# Patient Record
Sex: Female | Born: 1935 | Race: White | Hispanic: No | Smoking: Never smoker
Health system: Southern US, Community
[De-identification: ages and names within clinical notes are randomized; demographics above are authoritative.]

## PROBLEM LIST (undated history)

## (undated) DIAGNOSIS — M1990 Unspecified osteoarthritis, unspecified site: Secondary | ICD-10-CM

## (undated) DIAGNOSIS — K219 Gastro-esophageal reflux disease without esophagitis: Secondary | ICD-10-CM

## (undated) DIAGNOSIS — F32A Depression, unspecified: Secondary | ICD-10-CM

## (undated) DIAGNOSIS — E785 Hyperlipidemia, unspecified: Secondary | ICD-10-CM

## (undated) DIAGNOSIS — M797 Fibromyalgia: Secondary | ICD-10-CM

## (undated) DIAGNOSIS — C4490 Unspecified malignant neoplasm of skin, unspecified: Secondary | ICD-10-CM

## (undated) DIAGNOSIS — T4145XA Adverse effect of unspecified anesthetic, initial encounter: Secondary | ICD-10-CM

## (undated) DIAGNOSIS — K648 Other hemorrhoids: Secondary | ICD-10-CM

## (undated) DIAGNOSIS — Z85828 Personal history of other malignant neoplasm of skin: Secondary | ICD-10-CM

## (undated) DIAGNOSIS — T8859XA Other complications of anesthesia, initial encounter: Secondary | ICD-10-CM

## (undated) DIAGNOSIS — N393 Stress incontinence (female) (male): Secondary | ICD-10-CM

## (undated) DIAGNOSIS — I10 Essential (primary) hypertension: Secondary | ICD-10-CM

## (undated) DIAGNOSIS — D649 Anemia, unspecified: Secondary | ICD-10-CM

## (undated) DIAGNOSIS — F329 Major depressive disorder, single episode, unspecified: Secondary | ICD-10-CM

## (undated) DIAGNOSIS — R0602 Shortness of breath: Secondary | ICD-10-CM

## (undated) DIAGNOSIS — G629 Polyneuropathy, unspecified: Secondary | ICD-10-CM

## (undated) HISTORY — PX: BREAST SURGERY: SHX581

## (undated) HISTORY — DX: Polyneuropathy, unspecified: G62.9

## (undated) HISTORY — DX: Essential (primary) hypertension: I10

## (undated) HISTORY — PX: CARPAL TUNNEL RELEASE: SHX101

## (undated) HISTORY — DX: Other hemorrhoids: K64.8

## (undated) HISTORY — DX: Depression, unspecified: F32.A

## (undated) HISTORY — DX: Stress incontinence (female) (male): N39.3

## (undated) HISTORY — DX: Fibromyalgia: M79.7

## (undated) HISTORY — DX: Hyperlipidemia, unspecified: E78.5

## (undated) HISTORY — DX: Gastro-esophageal reflux disease without esophagitis: K21.9

## (undated) HISTORY — DX: Anemia, unspecified: D64.9

## (undated) HISTORY — PX: JOINT REPLACEMENT: SHX530

## (undated) HISTORY — DX: Unspecified osteoarthritis, unspecified site: M19.90

## (undated) HISTORY — PX: ABDOMINAL HYSTERECTOMY: SHX81

## (undated) HISTORY — DX: Unspecified malignant neoplasm of skin, unspecified: C44.90

## (undated) HISTORY — PX: CHOLECYSTECTOMY: SHX55

## (undated) HISTORY — DX: Major depressive disorder, single episode, unspecified: F32.9

## (undated) HISTORY — PX: APPENDECTOMY: SHX54

---

## 1998-10-20 ENCOUNTER — Other Ambulatory Visit: Admission: RE | Admit: 1998-10-20 | Discharge: 1998-10-20 | Payer: Self-pay | Admitting: Obstetrics and Gynecology

## 1999-12-13 ENCOUNTER — Other Ambulatory Visit: Admission: RE | Admit: 1999-12-13 | Discharge: 1999-12-13 | Payer: Self-pay | Admitting: Obstetrics and Gynecology

## 2000-12-27 ENCOUNTER — Other Ambulatory Visit: Admission: RE | Admit: 2000-12-27 | Discharge: 2000-12-27 | Payer: Self-pay | Admitting: Obstetrics and Gynecology

## 2002-01-14 ENCOUNTER — Other Ambulatory Visit: Admission: RE | Admit: 2002-01-14 | Discharge: 2002-01-14 | Payer: Self-pay | Admitting: Gynecology

## 2003-04-29 ENCOUNTER — Ambulatory Visit (HOSPITAL_COMMUNITY): Admission: RE | Admit: 2003-04-29 | Discharge: 2003-04-29 | Payer: Self-pay | Admitting: Gastroenterology

## 2003-04-29 ENCOUNTER — Encounter (INDEPENDENT_AMBULATORY_CARE_PROVIDER_SITE_OTHER): Payer: Self-pay | Admitting: *Deleted

## 2003-04-29 HISTORY — PX: COLONOSCOPY: SHX5424

## 2004-03-02 ENCOUNTER — Other Ambulatory Visit: Admission: RE | Admit: 2004-03-02 | Discharge: 2004-03-02 | Payer: Self-pay | Admitting: Gynecology

## 2004-09-28 ENCOUNTER — Encounter: Admission: RE | Admit: 2004-09-28 | Discharge: 2004-09-28 | Payer: Self-pay | Admitting: Orthopedic Surgery

## 2004-09-30 ENCOUNTER — Ambulatory Visit (HOSPITAL_BASED_OUTPATIENT_CLINIC_OR_DEPARTMENT_OTHER): Admission: RE | Admit: 2004-09-30 | Discharge: 2004-09-30 | Payer: Self-pay | Admitting: Orthopedic Surgery

## 2004-09-30 ENCOUNTER — Ambulatory Visit (HOSPITAL_COMMUNITY): Admission: RE | Admit: 2004-09-30 | Discharge: 2004-09-30 | Payer: Self-pay | Admitting: Orthopedic Surgery

## 2005-03-04 ENCOUNTER — Encounter: Admission: RE | Admit: 2005-03-04 | Discharge: 2005-03-04 | Payer: Self-pay | Admitting: Gynecology

## 2006-03-27 ENCOUNTER — Other Ambulatory Visit: Admission: RE | Admit: 2006-03-27 | Discharge: 2006-03-27 | Payer: Self-pay | Admitting: Gynecology

## 2007-04-17 ENCOUNTER — Encounter: Admission: RE | Admit: 2007-04-17 | Discharge: 2007-04-17 | Payer: Self-pay | Admitting: Gynecology

## 2007-12-12 ENCOUNTER — Inpatient Hospital Stay (HOSPITAL_COMMUNITY): Admission: RE | Admit: 2007-12-12 | Discharge: 2007-12-17 | Payer: Self-pay | Admitting: Orthopedic Surgery

## 2008-06-20 ENCOUNTER — Encounter: Admission: RE | Admit: 2008-06-20 | Discharge: 2008-06-20 | Payer: Self-pay | Admitting: Neurology

## 2008-06-26 ENCOUNTER — Encounter: Admission: RE | Admit: 2008-06-26 | Discharge: 2008-09-24 | Payer: Self-pay | Admitting: Neurology

## 2009-02-27 ENCOUNTER — Inpatient Hospital Stay (HOSPITAL_COMMUNITY): Admission: EM | Admit: 2009-02-27 | Discharge: 2009-03-04 | Payer: Self-pay | Admitting: Emergency Medicine

## 2009-02-28 ENCOUNTER — Encounter (INDEPENDENT_AMBULATORY_CARE_PROVIDER_SITE_OTHER): Payer: Self-pay | Admitting: *Deleted

## 2009-05-26 ENCOUNTER — Encounter: Admission: RE | Admit: 2009-05-26 | Discharge: 2009-05-26 | Payer: Self-pay | Admitting: Orthopedic Surgery

## 2011-01-08 ENCOUNTER — Emergency Department (HOSPITAL_COMMUNITY): Payer: Medicare Other

## 2011-01-08 ENCOUNTER — Encounter (INDEPENDENT_AMBULATORY_CARE_PROVIDER_SITE_OTHER): Payer: Self-pay | Admitting: *Deleted

## 2011-01-08 ENCOUNTER — Inpatient Hospital Stay (HOSPITAL_COMMUNITY)
Admission: EM | Admit: 2011-01-08 | Discharge: 2011-01-12 | DRG: 394 | Disposition: A | Discharge status: Home / Self Care | Payer: Medicare Other | Attending: Internal Medicine | Admitting: Internal Medicine

## 2011-01-08 DIAGNOSIS — F3289 Other specified depressive episodes: Secondary | ICD-10-CM | POA: Diagnosis present

## 2011-01-08 DIAGNOSIS — N390 Urinary tract infection, site not specified: Secondary | ICD-10-CM | POA: Diagnosis present

## 2011-01-08 DIAGNOSIS — K219 Gastro-esophageal reflux disease without esophagitis: Secondary | ICD-10-CM | POA: Diagnosis present

## 2011-01-08 DIAGNOSIS — I10 Essential (primary) hypertension: Secondary | ICD-10-CM | POA: Diagnosis present

## 2011-01-08 DIAGNOSIS — E785 Hyperlipidemia, unspecified: Secondary | ICD-10-CM | POA: Diagnosis present

## 2011-01-08 DIAGNOSIS — M1710 Unilateral primary osteoarthritis, unspecified knee: Secondary | ICD-10-CM | POA: Diagnosis present

## 2011-01-08 DIAGNOSIS — F329 Major depressive disorder, single episode, unspecified: Secondary | ICD-10-CM | POA: Diagnosis present

## 2011-01-08 DIAGNOSIS — D62 Acute posthemorrhagic anemia: Secondary | ICD-10-CM | POA: Diagnosis present

## 2011-01-08 DIAGNOSIS — K559 Vascular disorder of intestine, unspecified: Principal | ICD-10-CM | POA: Diagnosis present

## 2011-01-08 DIAGNOSIS — D72829 Elevated white blood cell count, unspecified: Secondary | ICD-10-CM | POA: Diagnosis present

## 2011-01-08 DIAGNOSIS — G609 Hereditary and idiopathic neuropathy, unspecified: Secondary | ICD-10-CM | POA: Diagnosis present

## 2011-01-08 LAB — COMPREHENSIVE METABOLIC PANEL
ALT: 16 U/L (ref 0–35)
AST: 24 U/L (ref 0–37)
Albumin: 3.6 g/dL (ref 3.5–5.2)
Alkaline Phosphatase: 77 U/L (ref 39–117)
BUN: 32 mg/dL — ABNORMAL HIGH (ref 6–23)
CO2: 24 mEq/L (ref 19–32)
Calcium: 9.9 mg/dL (ref 8.4–10.5)
Chloride: 98 mEq/L (ref 96–112)
Creatinine, Ser: 1.33 mg/dL — ABNORMAL HIGH (ref 0.4–1.2)
GFR calc Af Amer: 47 mL/min — ABNORMAL LOW (ref >60)
GFR calc non Af Amer: 39 mL/min — ABNORMAL LOW (ref >60)
Glucose, Bld: 128 mg/dL — ABNORMAL HIGH (ref 70–99)
Potassium: 3.7 mEq/L (ref 3.5–5.1)
Sodium: 134 mEq/L — ABNORMAL LOW (ref 135–145)
Total Bilirubin: 1.2 mg/dL (ref 0.3–1.2)
Total Protein: 6.7 g/dL (ref 6.0–8.3)

## 2011-01-08 LAB — HEMOGLOBIN AND HEMATOCRIT, BLOOD
HCT: 37.5 % (ref 36.0–46.0)
Hemoglobin: 12.7 g/dL (ref 12.0–15.0)

## 2011-01-08 LAB — DIFFERENTIAL
Basophils Absolute: 0 10*3/uL (ref 0.0–0.1)
Basophils Relative: 0 % (ref 0–1)
Eosinophils Absolute: 0 10*3/uL (ref 0.0–0.7)
Eosinophils Relative: 0 % (ref 0–5)
Lymphocytes Relative: 6 % — ABNORMAL LOW (ref 12–46)
Lymphs Abs: 1.7 10*3/uL (ref 0.7–4.0)
Monocytes Absolute: 1.7 10*3/uL — ABNORMAL HIGH (ref 0.1–1.0)
Monocytes Relative: 6 % (ref 3–12)
Neutro Abs: 25.1 10*3/uL — ABNORMAL HIGH (ref 1.7–7.7)
Neutrophils Relative %: 88 % — ABNORMAL HIGH (ref 43–77)

## 2011-01-08 LAB — URINALYSIS, ROUTINE W REFLEX MICROSCOPIC
Bilirubin Urine: NEGATIVE
Hgb urine dipstick: NEGATIVE
Ketones, ur: NEGATIVE mg/dL
Nitrite: NEGATIVE
Protein, ur: NEGATIVE mg/dL
Specific Gravity, Urine: 1.018 (ref 1.005–1.030)
Urine Glucose, Fasting: NEGATIVE mg/dL
Urobilinogen, UA: 0.2 mg/dL (ref 0.0–1.0)
pH: 5.5 (ref 5.0–8.0)

## 2011-01-08 LAB — POCT CARDIAC MARKERS
CKMB, poc: <1 ng/mL — ABNORMAL LOW (ref 1.0–8.0)
Myoglobin, poc: 185 ng/mL (ref 12–200)
Troponin i, poc: <0.05 ng/mL (ref 0.00–0.09)

## 2011-01-08 LAB — CBC
HCT: 41 % (ref 36.0–46.0)
Hemoglobin: 13.9 g/dL (ref 12.0–15.0)
MCH: 29.4 pg (ref 26.0–34.0)
MCHC: 33.9 g/dL (ref 30.0–36.0)
MCV: 86.9 fL (ref 78.0–100.0)
Platelets: 261 10*3/uL (ref 150–400)
RBC: 4.72 MIL/uL (ref 3.87–5.11)
RDW: 14.8 % (ref 11.5–15.5)
WBC: 28.5 10*3/uL — ABNORMAL HIGH (ref 4.0–10.5)

## 2011-01-08 LAB — LIPASE, BLOOD: Lipase: 19 U/L (ref 11–59)

## 2011-01-08 LAB — URINE MICROSCOPIC-ADD ON

## 2011-01-08 LAB — OCCULT BLOOD, POC DEVICE: Fecal Occult Bld: POSITIVE

## 2011-01-08 MED ORDER — IOHEXOL 300 MG/ML  SOLN
125.0000 mL | Freq: Once | INTRAMUSCULAR | Status: AC | PRN
  Administered 2011-01-08: 125 mL via INTRAVENOUS

## 2011-01-09 LAB — DIFFERENTIAL
Basophils Absolute: 0 10*3/uL (ref 0.0–0.1)
Basophils Relative: 0 % (ref 0–1)
Eosinophils Absolute: 0 10*3/uL (ref 0.0–0.7)
Eosinophils Relative: 0 % (ref 0–5)
Lymphocytes Relative: 10 % — ABNORMAL LOW (ref 12–46)
Lymphs Abs: 2.4 10*3/uL (ref 0.7–4.0)
Monocytes Absolute: 2 10*3/uL — ABNORMAL HIGH (ref 0.1–1.0)
Monocytes Relative: 8 % (ref 3–12)
Neutro Abs: 19.8 10*3/uL — ABNORMAL HIGH (ref 1.7–7.7)
Neutrophils Relative %: 82 % — ABNORMAL HIGH (ref 43–77)

## 2011-01-09 LAB — HEMOGLOBIN AND HEMATOCRIT, BLOOD: Hemoglobin: 11.6 g/dL — ABNORMAL LOW (ref 12.0–15.0)

## 2011-01-09 LAB — CBC
HCT: 36.1 % (ref 36.0–46.0)
Hemoglobin: 11.9 g/dL — ABNORMAL LOW (ref 12.0–15.0)
MCH: 29 pg (ref 26.0–34.0)
MCHC: 33 g/dL (ref 30.0–36.0)
MCV: 87.8 fL (ref 78.0–100.0)
Platelets: 184 10*3/uL (ref 150–400)
RBC: 4.11 MIL/uL (ref 3.87–5.11)
RDW: 15.1 % (ref 11.5–15.5)
WBC: 24.2 10*3/uL — ABNORMAL HIGH (ref 4.0–10.5)

## 2011-01-09 LAB — BASIC METABOLIC PANEL
BUN: 27 mg/dL — ABNORMAL HIGH (ref 6–23)
CO2: 27 mEq/L (ref 19–32)
Calcium: 8.8 mg/dL (ref 8.4–10.5)
Chloride: 101 mEq/L (ref 96–112)
Creatinine, Ser: 0.92 mg/dL (ref 0.4–1.2)
GFR calc Af Amer: >60 mL/min (ref >60)
GFR calc non Af Amer: 59 mL/min — ABNORMAL LOW (ref >60)
Glucose, Bld: 137 mg/dL — ABNORMAL HIGH (ref 70–99)
Potassium: 3.3 mEq/L — ABNORMAL LOW (ref 3.5–5.1)
Sodium: 135 mEq/L (ref 135–145)

## 2011-01-09 LAB — GRAM STAIN

## 2011-01-09 LAB — CLOSTRIDIUM DIFFICILE BY PCR: Toxigenic C. Difficile by PCR: NEGATIVE

## 2011-01-10 ENCOUNTER — Encounter (INDEPENDENT_AMBULATORY_CARE_PROVIDER_SITE_OTHER): Payer: Self-pay | Admitting: *Deleted

## 2011-01-10 ENCOUNTER — Inpatient Hospital Stay (HOSPITAL_COMMUNITY): Payer: Medicare Other

## 2011-01-10 LAB — COMPREHENSIVE METABOLIC PANEL
ALT: 11 U/L (ref 0–35)
Albumin: 2.6 g/dL — ABNORMAL LOW (ref 3.5–5.2)
Alkaline Phosphatase: 61 U/L (ref 39–117)
Calcium: 8.6 mg/dL (ref 8.4–10.5)
Potassium: 3.1 mEq/L — ABNORMAL LOW (ref 3.5–5.1)
Sodium: 139 mEq/L (ref 135–145)
Total Protein: 4.9 g/dL — ABNORMAL LOW (ref 6.0–8.3)

## 2011-01-10 LAB — CBC
Platelets: 166 10*3/uL (ref 150–400)
RDW: 15 % (ref 11.5–15.5)
WBC: 15.7 10*3/uL — ABNORMAL HIGH (ref 4.0–10.5)

## 2011-01-10 LAB — OVA AND PARASITE EXAMINATION

## 2011-01-10 LAB — DIFFERENTIAL
Basophils Absolute: 0 10*3/uL (ref 0.0–0.1)
Basophils Relative: 0 % (ref 0–1)
Eosinophils Absolute: 0.1 10*3/uL (ref 0.0–0.7)
Eosinophils Relative: 1 % (ref 0–5)

## 2011-01-11 DIAGNOSIS — R933 Abnormal findings on diagnostic imaging of other parts of digestive tract: Secondary | ICD-10-CM

## 2011-01-11 DIAGNOSIS — K5289 Other specified noninfective gastroenteritis and colitis: Secondary | ICD-10-CM

## 2011-01-11 LAB — CBC
HCT: 30.2 % — ABNORMAL LOW (ref 36.0–46.0)
Hemoglobin: 10 g/dL — ABNORMAL LOW (ref 12.0–15.0)
MCHC: 33.1 g/dL (ref 30.0–36.0)
MCV: 87.3 fL (ref 78.0–100.0)
WBC: 11.3 10*3/uL — ABNORMAL HIGH (ref 4.0–10.5)

## 2011-01-11 LAB — BASIC METABOLIC PANEL
CO2: 26 mEq/L (ref 19–32)
Glucose, Bld: 114 mg/dL — ABNORMAL HIGH (ref 70–99)
Potassium: 2.9 mEq/L — ABNORMAL LOW (ref 3.5–5.1)
Sodium: 136 mEq/L (ref 135–145)

## 2011-01-12 LAB — CBC
HCT: 30.4 % — ABNORMAL LOW (ref 36.0–46.0)
Hemoglobin: 10.1 g/dL — ABNORMAL LOW (ref 12.0–15.0)
MCH: 29.4 pg (ref 26.0–34.0)
MCV: 88.4 fL (ref 78.0–100.0)
RBC: 3.44 MIL/uL — ABNORMAL LOW (ref 3.87–5.11)

## 2011-01-12 LAB — BASIC METABOLIC PANEL
CO2: 28 mEq/L (ref 19–32)
Chloride: 103 mEq/L (ref 96–112)
GFR calc Af Amer: >60 mL/min (ref >60)
Potassium: 3.6 mEq/L (ref 3.5–5.1)
Sodium: 139 mEq/L (ref 135–145)

## 2011-01-13 ENCOUNTER — Encounter (INDEPENDENT_AMBULATORY_CARE_PROVIDER_SITE_OTHER): Payer: Self-pay | Admitting: *Deleted

## 2011-01-13 LAB — STOOL CULTURE

## 2011-01-15 LAB — CULTURE, BLOOD (ROUTINE X 2): Culture  Setup Time: 201202050047

## 2011-01-17 ENCOUNTER — Other Ambulatory Visit: Payer: Self-pay | Admitting: Orthopedic Surgery

## 2011-01-17 ENCOUNTER — Encounter (INDEPENDENT_AMBULATORY_CARE_PROVIDER_SITE_OTHER): Payer: Self-pay | Admitting: *Deleted

## 2011-01-17 ENCOUNTER — Encounter (HOSPITAL_COMMUNITY): Payer: Medicare Other | Attending: Orthopedic Surgery

## 2011-01-17 DIAGNOSIS — Z01810 Encounter for preprocedural cardiovascular examination: Secondary | ICD-10-CM | POA: Insufficient documentation

## 2011-01-17 DIAGNOSIS — Z01812 Encounter for preprocedural laboratory examination: Secondary | ICD-10-CM | POA: Insufficient documentation

## 2011-01-17 DIAGNOSIS — Z01811 Encounter for preprocedural respiratory examination: Secondary | ICD-10-CM | POA: Insufficient documentation

## 2011-01-17 LAB — CBC
Hemoglobin: 11.7 g/dL — ABNORMAL LOW (ref 12.0–15.0)
MCH: 29.3 pg (ref 26.0–34.0)
MCV: 88.5 fL (ref 78.0–100.0)
RBC: 3.99 MIL/uL (ref 3.87–5.11)

## 2011-01-17 LAB — COMPREHENSIVE METABOLIC PANEL
AST: 55 U/L — ABNORMAL HIGH (ref 0–37)
Albumin: 3.2 g/dL — ABNORMAL LOW (ref 3.5–5.2)
Calcium: 9.5 mg/dL (ref 8.4–10.5)
Chloride: 101 mEq/L (ref 96–112)
Creatinine, Ser: 0.67 mg/dL (ref 0.4–1.2)
GFR calc Af Amer: >60 mL/min (ref >60)
Total Bilirubin: 0.3 mg/dL (ref 0.3–1.2)
Total Protein: 6.5 g/dL (ref 6.0–8.3)

## 2011-01-17 LAB — URINALYSIS, ROUTINE W REFLEX MICROSCOPIC
Protein, ur: NEGATIVE mg/dL
Urine Glucose, Fasting: NEGATIVE mg/dL

## 2011-01-17 LAB — PROTIME-INR: Prothrombin Time: 13.3 seconds (ref 11.6–15.2)

## 2011-01-17 LAB — SURGICAL PCR SCREEN: Staphylococcus aureus: NEGATIVE

## 2011-01-17 NOTE — H&P (Signed)
[name], [name] NO.:  [id number]  MEDICAL RECORD NO.:  [id number]           PATIENT TYPE:  E  LOCATION:  WLED                         FACILITY:  [facility]  PHYSICIAN:  Homero Fellers, MD   DATE OF BIRTH:  1930/10/09  DATE OF ADMISSION:  01/08/2011 DATE OF DISCHARGE:                             HISTORY & PHYSICAL   CHIEF COMPLAINT:  Vomiting, abdominal pain, and rectal bleeding.  HISTORY OF PRESENT ILLNESS:  This is a 77 year old woman, who lives alone.  She was using the commode yesterday when she noticed passage of frank red blood per rectum this happened several times yesterday and also continued today even though to a lesser severity.  Today, she is passing dark or tart blood anytime she uses the bathroom.  She also vomited 4 times yesterday, but none today.  She also complained of abdominal pain, which was initially on the left lower quadrant region, but now involving the whole of the lower abdomen in a band-like fashion. She described abdominal pain as clumping in nature.  She has no prior history of rectal bleed.  She denies any chest pain, shortness of breath, or palpitations.  She had subjective fever with some chills yesterday at her home.  She denies any urinary symptoms such as dysuria, urinary frequency, or hematuria.  She has no colonoscopy done recently.  PAST MEDICAL HISTORY: 1. High blood pressure. 2. Anemia. 3. Gastroesophageal reflux disease. 4. Osteoarthritis. 5. Peripheral neuropathy. 6. Skin cancer.  She has been planned for knee replacement surgery by Dr. Lequita Halt.  PAST SURGICAL HISTORY:  Replacement of the right knee in 2009.  MEDICATIONS: 1. Aspirin 325 mg daily. 2. Lasix 20 mg daily. 3. Lisinopril 20 mg daily. 4. Lyrica 50 mg b.i.d. 5. Trazodone 20 mg q.h.s. 6. Zocor 20 mg q.h.s. 7. Ultram 50 mg p.r.n.  ALLERGIES:  NONE.  SOCIAL HISTORY:  No smoking, alcohol or drug use.  She lives alone.  FAMILY HISTORY:   Positive for colon cancer in mother.  No history of myocardial infarction in any family members.  Ten-point review of systems is negative except as above.  PHYSICAL EXAMINATION:  VITAL SIGNS:  Blood pressure 102/69 with pulse of 121 on arrival, but this has improved to blood pressure of 142/73 and pulse of 89; respirations are 18, temperature is 98.3, and O2 sat 94% on room air. GENERAL:  The patient is comfortable, lying in bed, in no respiratory distress. HEENT:  Pallor.  She is not pale, anicteric. NECK:  Supple.  No JVD, adenopathy, or thyromegaly. LUNGS:  Clear to auscultation bilaterally.  No wheezing or crackles. HEART:  S1 and S2 regular rate and rhythm.  No murmurs, rubs, or gallop. ABDOMEN:  Full, soft, vague tenderness in the lower abdominal region. No rebound or tenderness.  Bowel sound is present.  No masses. EXTREMITIES:  No Edema, clubbing, or cyanosis. NEUROLOGIC:  No focal deficits.Marland Kitchen  LABORATORY DATA:  White count is 28,000 with a left shift, hemoglobin is 13.9, platelet count is 261.  Occult blood is positive in stool. Urinalysis suggests urinary tract infection.  Chemistry, sodium 134, potassium 3.7, BUN 32,  creatinine 1.33, and glucose is 128.  Liver enzymes normal.  Cardiac enzymes normal.  CT of the abdomen showed evidence of acute colitis involving the distal colon from the level of the splenic flexure all the way to the rectum.  There are no perforation, obstruction, or abscess.  There is some free fluid in the pelvis.  Acute abdominal series just showed prominent small bowel with air-fluid levels, which proved to suggest gastroenteritis.  ASSESSMENT:  A 77 year old woman with: 1. Acute colitis with no evidence of perforation or abscess 2. Leukocytosis with left shift. 3. Urinary tract infection. 4. Rectal bleeding, which may be secondary to acute colitis, but may     also represent bleeding from other sources such as diverticular     bleed or even occult  malignancy. 5. Mild acute kidney failure likely secondary to fluid losses from     vomiting and rectal bleeding. 6. High blood pressure, which appears fairly stable at this time.  PLAN:  Admit to Telemetry. Do serial hemoglobin.  Alertness 24 hours. Continue IV fluids, keep n.p.o. for the next 24 hours, after which she can poorly be commenced on clear liquids if symptoms are improving.  I will put her on antiemesis as well as IV antibiotics.  Blood culture and urine culture will be sent.  The patient will also benefit from gastroenterology consultation on this admission.  She will probably need a colonoscopy once the infection is controlled probably earliest as an outpatient.  I will use Flagyl and ciprofloxacin at this time as far as antibiotics is concerned.  Further adjustments could be done based on culture results.  Condition overall is stable.     Homero Fellers, MD     FA/MEDQ  D:  01/08/2011  T:  01/08/2011  Job:  [id number]  Electronically Signed by Homero Fellers  on 01/10/2011 10:30:31 PM

## 2011-01-24 NOTE — Discharge Summary (Signed)
[name], GAMBRELL NO.:  [id number]  MEDICAL RECORD NO.:  [id number]           PATIENT TYPE:  I  LOCATION:  1432                         FACILITY:  [facility]  PHYSICIAN:  Jeoffrey Massed, MD    DATE OF BIRTH:  Jun 15, 1930  DATE OF ADMISSION:  01/08/2011 DATE OF DISCHARGE:                        DISCHARGE SUMMARY - REFERRING   PRIMARY CARE PRACTITIONER:  Dr. Knox Royalty at [facility].  PRIMARY GASTROENTEROLOGIST:  Hedwig Morton. [name], MD  PRIMARY ORTHOPEDIC SURGEON:  Ollen Gross, MD  PRIMARY DISCHARGE DIAGNOSES: 1. Left-sided colitis either ischemic or infectious. 2. Mild acute blood loss anemia.  SECONDARY DISCHARGE DIAGNOSES: 1. Hypertension. 2. History of peripheral neuropathy. 3. Depression. 4. Osteoarthritis of the left knee due for a knee placement in the     next few weeks. 5. Dyslipidemia.  DISCHARGE MEDICATIONS: 1. Ciprofloxacin 500 mg 1 tablet p.o. twice daily for 4 more days. 2. Flagyl 500 mg 1 tablet p.o. 3 times a day for 4 more days. 3. Lyrica 50 mg 1 capsule p.o. twice daily. 4. Multivitamins 1 tablet p.o. daily. 5. Zocor 20 mg 1 tablet p.o. nightly. 6. Trazodone 100 mg 1 tablet p.o. nightly p.r.n. 7. Ultram 50 mg 1 tablet p.o. q.4 hours p.r.n. 8. Vivelle 0.075 mg/24 hours 1 patch transdermally on Mondays,     Wednesdays, and Fridays. 9. Zoloft 50 mg 1 tablet p.o. daily.  CONSULTATION:  Hedwig Morton. [name], MD, from [facility].  BRIEF HISTORY OF PRESENT ILLNESS:  The patient is a 77 year old Caucasian female who came in with abdominal pain, nausea, vomiting along with rectal bleeding.  She was then admitted to the hospitalist service for further evaluation and treatment.  PERTINENT LABORATORY DATA: 1. Hemoglobin on discharge is 10.1. 2. Chemistries on discharge shows a potassium of 3.6 and a creatinine     of 0.6. 3. Stool cultures preliminary shows no suspicious colonies. 4. Stool for ova and parasites were  negative. 5. Blood cultures drawn on January 08, 2011, are negative so far. 6. Stool C difficile PCR was negative as well.  RADIOLOGICAL STUDIES:  CT of the abdomen and pelvis done on January 08, 2011, shows acute colitis involving the distal colon, beginning at the level of the splenic flexure and continuing to the level of the rectum. No evidence of associated perforation, obstruction, or abscesses.  There is some free fluid in the pelvis.  BRIEF HOSPITAL COURSE: 1. Acute colitis.  This was involving the left side.  This was     presumed to be ischemic, however, we could not rule out infectious     etiology.  All of the patient's stool studies as well as blood     cultures are negative.  Initially on admission, she had a white     count of 28,500.  Following admission, she was placed n.p.o., given     fluids, and placed on ciprofloxacin and Flagyl.  She continued to     have slow improvement over the past 24-48 hours.  She has been     transitioned to clear liquids and then to full liquids and now to a  low residual diet which she has been tolerating well.  Her white     count yesterday has decreased down to 11.3.  Plans are to continue     the patient on Cipro and Flagyl for the next 4 days upon discharge.     She will follow up with Dr. Lina Sar from [facility] for further     workup as an outpatient, perhaps a colonoscopy at some point in     time when the colitis subsides.  Since this is presumed at this     point to be ischemic in nature and during this hospitalization, her     blood pressure was closely monitored without the need for starting     any of her antihypertensive medications.  Her antihypertensive     medications will be placed on hold so will her Lasix to prevent a     low flow state in case this was from an ischemic origin. 2. Anemia.  This was secondary to blood loss secondary to colitis.     This has resolved with the patient having no hematochezia for  the     past 48 hours.  Her hemoglobin on discharge was 10.1.  She did not     require any blood transfusion. 3. Hypertension.  As noted above, this was controlled without the use     of any of the patient's usual antihypertensive medications.  With     the concern of this being ischemic colitis, we have elected not to     resume her blood pressure medications to prevent a low flow state     causing a recurrence of her symptoms.  I have told the patient to     monitor her blood pressure carefully and follow up with her primary     care physician in the next 2-5 days for a recheck to see if she     actually needs these medications to be restarted. 4. Osteoarthritis.  The patient does see Dr. Lequita Halt from Orthoped[facility]     and is scheduled for left knee replacement surgery.  It is     suggested that during this time the patient be kept well hydrated     and her Lasix not be resumed to prevent a low flow state which can     cause recurrence of the colitis.  I have asked the patient to make     a followup appointment with Dr. Ollen Gross in the next 1 week so     that he is aware that the patient was in the hospital for a     presumed ischemic colitis flare. 5. Dyslipidemia.  She is to continue her statin. 6. Neuropathy.  She is to continue Lyrica.  DISPOSITION:  The patient is stable to be discharged home.  FOLLOWUP INSTRUCTIONS: 1. The patient to continue with a low residual diet for at least 2     weeks' time. 2. She is to follow up with Dr. [name] from [facility] in the next 2-5 days for a followup appointment. 3. The patient to follow up with Dr. [name] from [facility] on March     19th at 10:15 a.m. 4. The patient to follow up with her primary orthopod, Dr. Ollen Gross, in the next 1 week.  She is to call and make an     appointment.  Total time spent 45 minutes.  Jeoffrey Massed, MD     SG/MEDQ  D:  01/12/2011  T:  01/12/2011  Job:   [id number]  cc:   Dr. Knox Royalty [facility]  Hedwig Morton. [name], MD [address] [place] [address]  Ollen Gross, M.D. Fax: [phone]  Electronically Signed by Jeoffrey Massed  on 01/24/2011 04:23:44 PM

## 2011-01-26 ENCOUNTER — Encounter (INDEPENDENT_AMBULATORY_CARE_PROVIDER_SITE_OTHER): Payer: Self-pay | Admitting: *Deleted

## 2011-01-26 ENCOUNTER — Inpatient Hospital Stay (HOSPITAL_COMMUNITY)
Admission: RE | Admit: 2011-01-26 | Discharge: 2011-01-29 | DRG: 470 | Disposition: A | Discharge status: Skilled Nursing Facility | Payer: Medicare Other | Source: Ambulatory Visit | Attending: Orthopedic Surgery | Admitting: Orthopedic Surgery

## 2011-01-26 DIAGNOSIS — N393 Stress incontinence (female) (male): Secondary | ICD-10-CM | POA: Diagnosis present

## 2011-01-26 DIAGNOSIS — Z79899 Other long term (current) drug therapy: Secondary | ICD-10-CM

## 2011-01-26 DIAGNOSIS — T40605A Adverse effect of unspecified narcotics, initial encounter: Secondary | ICD-10-CM | POA: Diagnosis not present

## 2011-01-26 DIAGNOSIS — I10 Essential (primary) hypertension: Secondary | ICD-10-CM | POA: Diagnosis present

## 2011-01-26 DIAGNOSIS — G609 Hereditary and idiopathic neuropathy, unspecified: Secondary | ICD-10-CM | POA: Diagnosis present

## 2011-01-26 DIAGNOSIS — R112 Nausea with vomiting, unspecified: Secondary | ICD-10-CM | POA: Diagnosis not present

## 2011-01-26 DIAGNOSIS — M1710 Unilateral primary osteoarthritis, unspecified knee: Principal | ICD-10-CM | POA: Diagnosis present

## 2011-01-26 DIAGNOSIS — K219 Gastro-esophageal reflux disease without esophagitis: Secondary | ICD-10-CM | POA: Diagnosis present

## 2011-01-26 DIAGNOSIS — Z96659 Presence of unspecified artificial knee joint: Secondary | ICD-10-CM

## 2011-01-26 DIAGNOSIS — D62 Acute posthemorrhagic anemia: Secondary | ICD-10-CM | POA: Diagnosis not present

## 2011-01-27 LAB — PROTIME-INR
INR: 1.11 (ref 0.00–1.49)
Prothrombin Time: 14.5 seconds (ref 11.6–15.2)

## 2011-01-27 LAB — BASIC METABOLIC PANEL
BUN: 9 mg/dL (ref 6–23)
CO2: 28 mEq/L (ref 19–32)
Calcium: 8.8 mg/dL (ref 8.4–10.5)
Creatinine, Ser: 0.6 mg/dL (ref 0.4–1.2)
GFR calc Af Amer: >60 mL/min (ref >60)

## 2011-01-27 LAB — CBC
MCH: 28.2 pg (ref 26.0–34.0)
MCHC: 31.7 g/dL (ref 30.0–36.0)
MCV: 89 fL (ref 78.0–100.0)
Platelets: 275 10*3/uL (ref 150–400)

## 2011-01-28 LAB — CBC
HCT: 27.3 % — ABNORMAL LOW (ref 36.0–46.0)
MCH: 28.5 pg (ref 26.0–34.0)
MCV: 88.3 fL (ref 78.0–100.0)
Platelets: 223 10*3/uL (ref 150–400)
RBC: 3.09 MIL/uL — ABNORMAL LOW (ref 3.87–5.11)

## 2011-01-28 LAB — BASIC METABOLIC PANEL
BUN: 7 mg/dL (ref 6–23)
CO2: 27 mEq/L (ref 19–32)
Calcium: 8.5 mg/dL (ref 8.4–10.5)
Chloride: 103 mEq/L (ref 96–112)
Creatinine, Ser: 0.51 mg/dL (ref 0.4–1.2)
Glucose, Bld: 127 mg/dL — ABNORMAL HIGH (ref 70–99)

## 2011-01-29 LAB — CBC
MCH: 28.6 pg (ref 26.0–34.0)
MCHC: 32.5 g/dL (ref 30.0–36.0)
MCV: 88 fL (ref 78.0–100.0)
Platelets: 212 10*3/uL (ref 150–400)
RBC: 2.76 MIL/uL — ABNORMAL LOW (ref 3.87–5.11)
RDW: 15.5 % (ref 11.5–15.5)

## 2011-01-30 LAB — TYPE AND SCREEN
DAT, IgG: NEGATIVE
Unit division: 0

## 2011-02-10 NOTE — Discharge Summary (Signed)
NAMEHAMDI, VARI              ACCOUNT NO.:  [id number]  MEDICAL RECORD NO.:  [id number]           PATIENT TYPE:  I  LOCATION:  1605                         FACILITY:  [facility]  PHYSICIAN:  Ollen Gross, M.D.    DATE OF BIRTH:  03/02/1930  DATE OF ADMISSION:  01/26/2011 DATE OF DISCHARGE:  01/17/2011                        DISCHARGE SUMMARY - REFERRING   TENTATIVE DATE OF DISCHARGE:  January 29, 2011.  ADMITTING DIAGNOSES: 1. Osteoarthritis left knee. 2. Hypertension. 3. History of anemia. 4. Gastroesophageal reflux disease. 5. Peripheral neuropathy. 6. History of skin cancer. 7. Some stress urinary incontinence.  DISCHARGE DIAGNOSES: 1. Osteoarthritis left knee, status post left total knee replacement     arthroplasty. 2. Postoperative acute blood loss anemia, did not require transfusion     at time of dictation. 3. Hypertension. 4. History of anemia. 5. Gastroesophageal reflux disease. 6. Peripheral neuropathy. 7. History of skin cancer. 8. Some stress urinary incontinence.  PROCEDURE:  On January 26, 2011, left total knee.  SURGEON:  Ollen Gross, M.D.  ASSISTANT:  Alexzandrew L. Perkins, P.A.C.  ANESTHESIA:  General.  TOURNIQUET TIME:  32 minutes.  CONSULTS:  None.  BRIEF HISTORY:  Mr. Schmelzle is a 77 year old female with end-stage arthritis of left knee with progressive worsening pain and dysfunction. She has had a successful right total knee, now presents for a left total knee.  LABORATORY DATA:  Preop CBC showed hemoglobin 11.7, hematocrit 35.3, white cell count 12.4, and platelets 460,000.  PT/INR 13.3 and 0.99 with a PTT of 34 on admission.  Chem panel on admission, minimally elevated SGOT of 55.  Remaining Chem panel within normal limits with the exception of slightly low albumin of 3.2.  Preop UA, cloudy otherwise negative.  Blood group type O negative.  Nasal swabs were negative. Staph aureus negative for MRSA.  Serial CBCs were followed  first 2 days after surgery.  Hemoglobin down to 9.7 on postop day #1, and then last H and H was 8.8 and 27.3 on postop day #2.  There is a CBC pending on postop day #3, possible date of discharge which is pending at time of dictation.  Serial pro times followed, last PT/INR at time of dictation was 20.1 and 1.69.  Serial BMETs were followed for 48 hours. Electrolytes all remained within normal limits.  Glucose did go up from 89 to 139 and back down to 127.  HOSPITAL COURSE:  The patient was admitted to [facility], taken to OR, underwent above-stated procedure without complication.  The patient tolerated the procedure well, later transferred to the recovery room orthopedic floor, given 24 hours postop of IV antibiotics.  She was started on p.o. medications and also IV PCA morphine.  She had moderate nausea and vomiting through the night and into the next morning, felt was due to a combination of the anesthesia but also more than likely the IV narcotics, so we discontinued the PCA, changed her pain meds around and antiemetics and fluids for the nausea.  With that, she was not having very good pain control, so worked on her pain management also, symptoms along with the postop nausea  and vomiting.  Did not do much on that first day due to the GI upset but by day 2, she was much better. Pain was under much better control, and the nausea and vomiting had resolved.  She was in excellent spirits.  Started getting up water-based therapy.  Hemoglobin was down a little bit to 8.8, but she was asymptomatic with this, so we just put her on some iron supplementation. Her I's and O's were positive balance, but she was actually diuresing her fluids pretty well on postop day #2, so we discontinued her Foley. She continued to progress well.  We got social work involved postoperatively because she wanted to look into skilled nursing facility.  She was progressing on day 2, possible tentative  discharge on Saturday, January 29, 2011, so we are making arrangements with the paperwork today with intention of possibly discharging her tomorrow on January 29, 2011.  DISCHARGE PLAN: 1. Tentative date of discharge tomorrow, January 29, 2011. 2. Discharge diagnoses, please see above.  DISCHARGE MEDICATIONS: 1. Current medications at time of transfer include: 2. Coumadin protocol.  Please titrate the Coumadin level for target     INR between 2.0 and 3.0.  She should be on Coumadin for 3 weeks     from the date of surgery. 3. Colace 100 mg p.o. b.i.d. 4. Zoloft 50 mg p.o. daily. 5. Lyrica 50 mg p.o. b.i.d. 6. Zocor 20 mg p.o. at bedtime. 7. Nexium 20 mg p.o. b.i.d. 8. Nu-Iron 150 mg p.o. daily for 3 weeks, then discontinue the Nu-     Iron. 9. Tylenol 325 one or two every 4-6 hours as needed for mild pain,     temperature, or headache. 10.Laxative of choice. 11.Enema of choice. 12.Robaxin 500 mg p.o. q.6-8h. p.r.n. spasm. 13.Tramadol 50 mg 1 or 2 every 6 hours needed for mild to moderate     pain. 14.Percocet 5 mg 1 or 2 every 4-6 hours as needed for severe pain. 15.Trazodone 200 mg p.o. at bedtime p.r.n. sleep. 16.Xanax 0.25 mg p.o. b.i.d. p.r.n. anxiety.  DIET:  Heart-healthy diet.  ACTIVITY:  She is weightbearing as tolerated to left lower extremity. Continue with gait training, ambulation, ADLs, range of motion, and strengthening exercises for total knee protocol.  Please note she may start showering once she is transferred to the rehab facility but do not submerge the incision under water, daily dressing change after each shower.  FOLLOWUP:  She needs to follow up Dr. Lequita Halt in the office on February 08, 2011, or February 10, 2011, 2 weeks from date of surgery.  Please contact the office at [phone] to help arrange appointment, followup care, and time and transportation for the patient followup.  DISPOSITION:  Pending at the time of dictation but waiting on  skilled bed nursing.  CONDITION ON DISCHARGE:  Pending at the time of dictation, but at that time, she was improving postoperatively.     Alexzandrew L. Julien Girt, P.A.C.   ______________________________ Ollen Gross, M.D.    ALP/MEDQ  D:  01/28/2011  T:  01/28/2011  Job:  [id number]  cc:   Gloriajean Dell. Andrey Campanile, M.D. Fax: [phone]  Electronically Signed by Patrica Duel P.A.C. on 01/31/2011 06:23:37 PM Electronically Signed by Ollen Gross M.D. on 02/09/2011 03:44:43 PM

## 2011-02-10 NOTE — Op Note (Signed)
NAMEARLENNE, KIMBLEY NO.:  [id number]  MEDICAL RECORD NO.:  [id number]           PATIENT TYPE:  I  LOCATION:  1605                         FACILITY:  [facility]  PHYSICIAN:  Ollen Gross, M.D.    DATE OF BIRTH:  [birth year]  DATE OF PROCEDURE:  01/26/2011 DATE OF DISCHARGE:                              OPERATIVE REPORT   PREOPERATIVE DIAGNOSIS:  Osteoarthritis, left knee.  POSTOPERATIVE DIAGNOSIS:  Osteoarthritis, left knee.  PROCEDURE:  Left total knee arthroplasty.  SURGEON:  Ollen Gross, M.D.  ASSISTANT:  Alexzandrew L. Perkins, P.A.C.  ANESTHESIA:  General.  ESTIMATED BLOOD LOSS:  Minimal.  DRAINS:  None.  TOURNIQUET TIME:  32 minutes at 300 mmHg.  COMPLICATIONS:  None.  CONDITION:  Stable to recovery.  CLINICAL NOTE:  Ms. Auman is a 77 year old female with end-stage arthritis of the left knee with progressively worsening pain and dysfunction.  She had a previous successful right total knee arthroplasty and presents now for left total knee arthroplasty.  PROCEDURE IN DETAIL:  After successful administration of general anesthetic, a tourniquet was placed high on her left thigh and her left lower extremity was prepped and draped in the usual sterile fashion. Extremity was wrapped in Esmarch, knee flexed, tourniquet inflated to 300 mmHg.  Midline incision was made with a #10 blade through subcutaneous tissue to the level of the extensor mechanism.  A fresh blade was used make a medial parapatellar arthrotomy.  Soft tissue on the proximal medial tibia subperiosteally elevated to the joint line with a knife and into the semimembranosus bursa with a Cobb elevator. Soft tissue laterally was elevated with attention being paid to avoid patellar tendon on tibial tubercle.  Patella was everted, knee flexed to 90 degrees, and ACL and PCL removed.  Drill was used create a starting hole and the distal femur canal was thoroughly irrigated.  A  5-degree left valgus alignment guide was placed.  Distal femoral cutting block was then pinned to remove 10 mm off the distal femur.  Distal femoral resection was made with an oscillating saw.  The tibia subluxed forward and the menisci were removed.  Extramedullary tibial alignment guide was placed referencing proximally at the medial aspect of the tibial tubercle and distally along the second metatarsal axis tibial crest.  Blocks pinned to remove 2 mm off the more deficient medial side.  Mediolateral sides had close to equivalent deficiency and it was fairly significant deficiency.  A resection was made with an oscillating saw.  Size 4 was the most appropriate tibial component and the proximal tibia was prepared with modular drill and keel punch for the size 4.  Femoral sizing guide was placed, size 4 was the most appropriate on the femur.  Rotations were marked of the epicondylar axis and confirmed by creating a rectangular flexion gap at 90 degrees.  Size 4 cutting block was pinned in this rotation and the anterior-posterior chamfer cuts made.  Intercondylar block was placed and that cut was made.  Trial size 4 posterior stabilized femur was placed.  The 12.5 mm posterior stabilized rotating platform insert trial was placed.  I __________  15 which allowed for full extension with excellent varus-valgus and anterior-posterior balance throughout full range of motion.  Patella was everted and thickness measured to be 20 mm; freehand resection was taken to 11 mm, 38 template was placed, lug holes were drilled, trial patella was placed and it tracked normally.  Osteophytes were removed off the posterior femur with the trial in place.  All trials were removed and the cut bone surfaces were prepared with pulsatile lavage.  Cement was mixed and once ready for implantation, the size 4 mobile bearing tibial tray size 4 posterior stabilized femur and 38 patella were cemented into place and  the patella was held with a clamp.  Trial 15-mm insert was placed, knee held in full extension and all extruded cement removed. When cement fully hardened, the 15-mm rotating platform insert is placed into the tibial tray.  Wound was copiously irrigated with saline solution and then the arthrotomy closed over Hemovac drain with interrupted #1 PDS.  Please note, we did a thorough synovectomy prior to closure.  Flexion against gravity to about 135 degrees.  The patella tracked normally.  Tourniquet was released after total time of 32 minutes.  Subcu was closed with interrupted 2-0 Vicryl, subcuticular with running 4-0 Monocryl.  Incisions were cleaned and dried and Steri- Strips and a bulky sterile dressing were applied.  She was then placed into a knee immobilizer, awakened and transported to recovery in stable condition.     Ollen Gross, M.D.     FA/MEDQ  D:  01/26/2011  T:  01/26/2011  Job:  [id number]  Electronically Signed by Ollen Gross M.D. on 02/09/2011 03:44:48 PM

## 2011-02-10 NOTE — Discharge Summary (Signed)
  NAMECRISTINA, CENICEROS NO.:  [id number]  MEDICAL RECORD NO.:  [id number]           PATIENT TYPE:  I  LOCATION:  1605                         FACILITY:  [facility]  PHYSICIAN:  Ollen Gross, M.D.    DATE OF BIRTH:  05-Oct-1930  DATE OF ADMISSION:  01/26/2011 DATE OF DISCHARGE:                              DISCHARGE SUMMARY   ADDENDUM  Ms. Schmoker did very well with physical therapy on February 24.  She walked 175 feet.  As of February 25 her INR is 2.01.  Her hemoglobin was 7.9 but her vital signs were stable with normal blood pressure, normal pulse and she is asymptomatic.  She will be on iron 150 mg daily for the anemia.  She is ready for discharge to [facility] on February 25.  She is stable and tolerating a regular diet.  She will follow up with [place] in two weeks.  Activity as tolerated with postoperative knee replacement protocol.  She may shower at this time.  We will see her back in the office in two weeks.  Please call [phone] for an appointment.     Ollen Gross, M.D.     FA/MEDQ  D:  01/29/2011  T:  01/29/2011  Job:  [id number]  Electronically Signed by Ollen Gross M.D. on 02/09/2011 03:44:46 PM

## 2011-02-14 DIAGNOSIS — Z85828 Personal history of other malignant neoplasm of skin: Secondary | ICD-10-CM | POA: Insufficient documentation

## 2011-02-14 DIAGNOSIS — F32A Depression, unspecified: Secondary | ICD-10-CM | POA: Insufficient documentation

## 2011-02-14 DIAGNOSIS — M1990 Unspecified osteoarthritis, unspecified site: Secondary | ICD-10-CM | POA: Insufficient documentation

## 2011-02-14 DIAGNOSIS — F329 Major depressive disorder, single episode, unspecified: Secondary | ICD-10-CM | POA: Insufficient documentation

## 2011-02-14 DIAGNOSIS — K219 Gastro-esophageal reflux disease without esophagitis: Secondary | ICD-10-CM | POA: Insufficient documentation

## 2011-02-14 DIAGNOSIS — N39498 Other specified urinary incontinence: Secondary | ICD-10-CM | POA: Insufficient documentation

## 2011-02-14 DIAGNOSIS — D62 Acute posthemorrhagic anemia: Secondary | ICD-10-CM | POA: Insufficient documentation

## 2011-02-14 DIAGNOSIS — G609 Hereditary and idiopathic neuropathy, unspecified: Secondary | ICD-10-CM | POA: Insufficient documentation

## 2011-02-14 DIAGNOSIS — I10 Essential (primary) hypertension: Secondary | ICD-10-CM | POA: Insufficient documentation

## 2011-02-14 DIAGNOSIS — E785 Hyperlipidemia, unspecified: Secondary | ICD-10-CM | POA: Insufficient documentation

## 2011-02-21 ENCOUNTER — Ambulatory Visit: Payer: Medicare Other | Admitting: Internal Medicine

## 2011-02-22 NOTE — Discharge Summary (Signed)
Summary: Left Sided Colitis    NAME:  [name], [name]              ACCOUNT NO.:  [id number]      MEDICAL RECORD NO.:  [id number]           PATIENT TYPE:  I      LOCATION:  1432                         FACILITY:  [facility]      PHYSICIAN:  Jeoffrey Massed, MD    DATE OF BIRTH:  1930-11-24      DATE OF ADMISSION:  01/08/2011   DATE OF DISCHARGE:                            DISCHARGE SUMMARY - REFERRING         PRIMARY CARE PRACTITIONER:  Dr. Knox Royalty at [facility].      PRIMARY GASTROENTEROLOGIST:  Hedwig Morton. [name], MD      PRIMARY ORTHOPEDIC SURGEON:  Ollen Gross, MD      PRIMARY DISCHARGE DIAGNOSES:   1. Left-sided colitis either ischemic or infectious.   2. Mild acute blood loss anemia.      SECONDARY DISCHARGE DIAGNOSES:   1. Hypertension.   2. History of peripheral neuropathy.   3. Depression.   4. Osteoarthritis of the left knee due for a knee placement in the       next few weeks.   5. Dyslipidemia.      DISCHARGE MEDICATIONS:   1. Ciprofloxacin 500 mg 1 tablet p.o. twice daily for 4 more days.   2. Flagyl 500 mg 1 tablet p.o. 3 times a day for 4 more days.   3. Lyrica 50 mg 1 capsule p.o. twice daily.   4. Multivitamins 1 tablet p.o. daily.   5. Zocor 20 mg 1 tablet p.o. nightly.   6. Trazodone 100 mg 1 tablet p.o. nightly p.r.n.   7. Ultram 50 mg 1 tablet p.o. q.4 hours p.r.n.   8. Vivelle 0.075 mg/24 hours 1 patch transdermally on Mondays,       Wednesdays, and Fridays.   9. Zoloft 50 mg 1 tablet p.o. daily.      CONSULTATION:  Hedwig Morton. [name], MD, from [facility].      BRIEF HISTORY OF PRESENT ILLNESS:  The patient is a 77 year old   Caucasian female who came in with abdominal pain, nausea, vomiting along   with rectal bleeding.  She was then admitted to the hospitalist service   for further evaluation and treatment.      PERTINENT LABORATORY DATA:   1. Hemoglobin on discharge is 10.1.   2. Chemistries on discharge shows a potassium of  3.6 and a creatinine       of 0.6.   3. Stool cultures preliminary shows no suspicious colonies.   4. Stool for ova and parasites were negative.   5. Blood cultures drawn on January 08, 2011, are negative so far.   6. Stool C difficile PCR was negative as well.      RADIOLOGICAL STUDIES:  CT of the abdomen and pelvis done on January 08, 2011, shows acute colitis involving the distal colon, beginning at the   level of the splenic flexure and continuing to the level of the rectum.   No evidence of associated perforation, obstruction, or abscesses.  There   is some free fluid in the pelvis.      BRIEF HOSPITAL COURSE:   1. Acute colitis.  This was involving the left side.  This was       presumed to be ischemic, however, we could not rule out infectious       etiology.  All of the patient's stool studies as well as blood       cultures are negative.  Initially on admission, she had a white       count of 28,500.  Following admission, she was placed n.p.o., given       fluids, and placed on ciprofloxacin and Flagyl.  She continued to       have slow improvement over the past 24-48 hours.  She has been       transitioned to clear liquids and then to full liquids and now to a       low residual diet which she has been tolerating well.  Her white       count yesterday has decreased down to 11.3.  Plans are to continue       the patient on Cipro and Flagyl for the next 4 days upon discharge.       She will follow up with Dr. Lina Sar from [facility] for further       workup as an outpatient, perhaps a colonoscopy at some point in       time when the colitis subsides.  Since this is presumed at this       point to be ischemic in nature and during this hospitalization, her       blood pressure was closely monitored without the need for starting       any of her antihypertensive medications.  Her antihypertensive       medications will be placed on hold so will her Lasix to prevent a        low flow state in case this was from an ischemic origin.   2. Anemia.  This was secondary to blood loss secondary to colitis.       This has resolved with the patient having no hematochezia for the       past 48 hours.  Her hemoglobin on discharge was 10.1.  She did not       require any blood transfusion.   3. Hypertension.  As noted above, this was controlled without the use       of any of the patient's usual antihypertensive medications.  With       the concern of this being ischemic colitis, we have elected not to       resume her blood pressure medications to prevent a low flow state       causing a recurrence of her symptoms.  I have told the patient to       monitor her blood pressure carefully and follow up with her primary       care physician in the next 2-5 days for a recheck to see if she       actually needs these medications to be restarted.   4. Osteoarthritis.  The patient does see Dr. Lequita Halt from [facility]       and is scheduled for left knee replacement surgery.  It is       suggested that during this time the patient be kept well hydrated       and her Lasix  not be resumed to prevent a low flow state which can       cause recurrence of the colitis.  I have asked the patient to make       a followup appointment with Dr. Ollen Gross in the next 1 week so       that he is aware that the patient was in the hospital for a       presumed ischemic colitis flare.   5. Dyslipidemia.  She is to continue her statin.   6. Neuropathy.  She is to continue Lyrica.      DISPOSITION:  The patient is stable to be discharged home.      FOLLOWUP INSTRUCTIONS:   1. The patient to continue with a low residual diet for at least 2       weeks' time.   2. She is to follow up with Dr. [name] from [facility] in the next 2-5 days for a followup appointment.   3. The patient to follow up with Dr. [name] from [facility] on March       19th at 10:15 a.m.   4. The  patient to follow up with her primary orthopod, Dr. Ollen Gross, in the next 1 week.  She is to call and make an       appointment.      Total time spent 45 minutes.               Jeoffrey Massed, MD               SG/MEDQ  D:  01/12/2011  T:  01/12/2011  Job:  [id number]      cc:   Dr. Knox Royalty   [facility]      Hedwig Morton. [name], MD   [address]   [place]   [place] [address]      Ollen Gross, M.D.   Fax: [phone]      Electronically Signed by Jeoffrey Massed  on 01/24/2011 04:23:44 PM

## 2011-02-22 NOTE — Discharge Summary (Signed)
Summary: Addendum    NAMEJASSICA, [name] NO.:  [id number]      MEDICAL RECORD NO.:  [id number]           PATIENT TYPE:  I      LOCATION:  1605                         FACILITY:  [facility]      PHYSICIAN:  Ollen Gross, M.D.    DATE OF BIRTH:  02-11-1930      DATE OF ADMISSION:  01/26/2011   DATE OF DISCHARGE:                                  DISCHARGE SUMMARY         ADDENDUM      [name] did very well with physical therapy on February 24.  She   walked 175 feet.  As of February 25 her INR is 2.01.  Her hemoglobin was   7.9 but her vital signs were stable with normal blood pressure, normal   pulse and she is asymptomatic.  She will be on iron 150 mg daily for the   anemia.  She is ready for discharge to [facility] on February 25.  She   is stable and tolerating a regular diet.  She will follow up with [place] in   two weeks.  Activity as tolerated with postoperative knee replacement   protocol.  She may shower at this time.  We will see her back in the   office in two weeks.  Please call [phone] for an appointment.               Ollen Gross, M.D.               FA/MEDQ  D:  01/29/2011  T:  01/29/2011  Job:  [id number]      Electronically Signed by Ollen Gross M.D. on 02/09/2011 03:44:46 PM

## 2011-02-22 NOTE — Op Note (Signed)
Summary: COLON (Dr Randa Evens)    NAME:  [name], [name]                     ACCOUNT NO.:  [id number]   MEDICAL RECORD NO.:  [id number]                   PATIENT TYPE:  AMB   LOCATION:  ENDO                                 FACILITY:  [facility]   PHYSICIAN:  James L. Malon Kindle., M.D.          DATE OF BIRTH:  [birth year]   DATE OF PROCEDURE:  04/29/2003  DATE OF DISCHARGE:                                 OPERATIVE REPORT   PROCEDURE:  Colonoscopy.   MEDICATIONS:  1. Fentanyl 75 micrograms.  2. Versed 5 mg IV.   INDICATIONS FOR PROCEDURE:  The patient is a 77 year old woman with a strong  family history of colon cancer with recent rectal bleeding. Her mother had  colon cancer.   DESCRIPTION OF PROCEDURE:  The procedure had been explained to the patient  and consent was obtained. The patient was placed in the left lateral  decubitus position and the Olympus scope was inserted and advanced. The prep  was quite good. Using abdominal pressure and position changes we were able  to advance over to the cecum. The cecum was identified  by identification of  the ileocecal valve and the appendiceal orifice. The right lower quadrant  was transilluminated.   The scope was withdrawn and the cecum, ascending colon, transverse colon,  descending colon and sigmoid colon was seen well. There was no significant  diverticular disease. No polyps were seen. The scope was withdrawn down into  the rectum and the rectum was free of polyps. In the retroflex view the  patient was seen to have internal hemorrhoids.   The scope was withdrawn. The patient tolerated the procedure well.   ASSESSMENT:  1. Rectal bleeding, probably due to internal hemorrhoids.  2. Strong family history of colon cancer.    PLAN:  Will give a hemorrhoid instruction sheet, fiber supplements. Will  recommend repeating colonoscopy in 5 years. Would recommend yearly  Hemoccults.     James L. Malon Kindle., M.D.    Waldron Session  D:  04/29/2003  T:  04/29/2003  Job:  [id number]   cc:   Gretta Cool, M.D.  [address] [place]  [place] [address]  Fax: [phone]   Teena Irani. Arlyce Dice, M.D.  [address]  [place]  [place] [address]  Fax: [phone]

## 2011-02-22 NOTE — Discharge Summary (Signed)
NAME:  [name], [name]              ACCOUNT NO.:  [id number]  MEDICAL RECORD NO.:  [id number]           PATIENT TYPE:  I  LOCATION:  1605                         FACILITY:  [facility]  PHYSICIAN:  Frank Aluisio, M.D.    DATE OF BIRTH:  03/30/1930  DATE OF ADMISSION:  01/26/2011 DATE OF DISCHARGE:  01/17/2011                        DISCHARGE SUMMARY - REFERRING   TENTATIVE DATE OF DISCHARGE:  January 29, 2011.  ADMITTING DIAGNOSES: 1. Osteoarthritis left knee. 2. Hypertension. 3. History of anemia. 4. Gastroesophageal reflux disease. 5. Peripheral neuropathy. 6. History of skin cancer. 7. Some stress urinary incontinence.  DISCHARGE DIAGNOSES: 1. Osteoarthritis left knee, status post left total knee replacement     arthroplasty. 2. Postoperative acute blood loss anemia, did not require transfusion     at time of dictation. 3. Hypertension. 4. History of anemia. 5. Gastroesophageal reflux disease. 6. Peripheral neuropathy. 7. History of skin cancer. 8. Some stress urinary incontinence.  PROCEDURE:  On January 26, 2011, left total knee.  SURGEON:  Frank Aluisio, M.D.  ASSISTANT:  Alexzandrew L. Perkins, P.A.C.  ANESTHESIA:  General.  TOURNIQUET TIME:  32 minutes.  CONSULTS:  None.  BRIEF HISTORY:  Mr. [name] is a 77-year-old female with end-stage arthritis of left knee with progressive worsening pain and dysfunction. She has had a successful right total knee, now presents for a left total knee.  LABORATORY DATA:  Preop CBC showed hemoglobin 11.7, hematocrit 35.3, white cell count 12.4, and platelets 460,000.  PT/INR 13.3 and 0.99 with a PTT of 34 on admission.  Chem panel on admission, minimally elevated SGOT of 55.  Remaining Chem panel within normal limits with the exception of slightly low albumin of 3.2.  Preop UA, cloudy otherwise negative.  Blood group type O negative.  Nasal swabs were negative. Staph aureus negative for MRSA.  Serial CBCs were followed  first 2 days after surgery.  Hemoglobin down to 9.7 on postop day #1, and then last H and H was 8.8 and 27.3 on postop day #2.  There is a CBC pending on postop day #3, possible date of discharge which is pending at time of dictation.  Serial pro times followed, last PT/INR at time of dictation was 20.1 and 1.69.  Serial BMETs were followed for 48 hours. Electrolytes all remained within normal limits.  Glucose did go up from 89 to 139 and back down to 127.  HOSPITAL COURSE:  The patient was admitted to [facility], taken to OR, underwent above-stated procedure without complication.  The patient tolerated the procedure well, later transferred to the recovery room orthopedic floor, given 24 hours postop of IV antibiotics.  She was started on p.o. medications and also IV PCA morphine.  She had moderate nausea and vomiting through the night and into the next morning, felt was due to a combination of the anesthesia but also more than likely the IV narcotics, so we discontinued the PCA, changed her pain meds around and antiemetics and fluids for the nausea.  With that, she was not having very good pain control, so worked on her pain management also, symptoms along with the postop nausea   and vomiting.  Did not do much on that first day due to the GI upset but by day 2, she was much better. Pain was under much better control, and the nausea and vomiting had resolved.  She was in excellent spirits.  Started getting up water-based therapy.  Hemoglobin was down a little bit to 8.8, but she was asymptomatic with this, so we just put her on some iron supplementation. Her I's and O's were positive balance, but she was actually diuresing her fluids pretty well on postop day #2, so we discontinued her Foley. She continued to progress well.  We got social work involved postoperatively because she wanted to look into skilled nursing facility.  She was progressing on day 2, possible tentative  discharge on Saturday, January 29, 2011, so we are making arrangements with the paperwork today with intention of possibly discharging her tomorrow on January 29, 2011.  DISCHARGE PLAN: 1. Tentative date of discharge tomorrow, January 29, 2011. 2. Discharge diagnoses, please see above.  DISCHARGE MEDICATIONS: 1. Current medications at time of transfer include: 2. Coumadin protocol.  Please titrate the Coumadin level for target     INR between 2.0 and 3.0.  She should be on Coumadin for 3 weeks     from the date of surgery. 3. Colace 100 mg p.o. b.i.d. 4. Zoloft 50 mg p.o. daily. 5. Lyrica 50 mg p.o. b.i.d. 6. Zocor 20 mg p.o. at bedtime. 7. Nexium 20 mg p.o. b.i.d. 8. Nu-Iron 150 mg p.o. daily for 3 weeks, then discontinue the Nu-     Iron. 9. Tylenol 325 one or two every 4-6 hours as needed for mild pain,     temperature, or headache. 10.Laxative of choice. 11.Enema of choice. 12.Robaxin 500 mg p.o. q.6-8h. p.r.n. spasm. 13.Tramadol 50 mg 1 or 2 every 6 hours needed for mild to moderate     pain. 14.Percocet 5 mg 1 or 2 every 4-6 hours as needed for severe pain. 15.Trazodone 200 mg p.o. at bedtime p.r.n. sleep. 16.Xanax 0.25 mg p.o. b.i.d. p.r.n. anxiety.  DIET:  Heart-healthy diet.  ACTIVITY:  She is weightbearing as tolerated to left lower extremity. Continue with gait training, ambulation, ADLs, range of motion, and strengthening exercises for total knee protocol.  Please note she may start showering once she is transferred to the rehab facility but do not submerge the incision under water, daily dressing change after each shower.  FOLLOWUP:  She needs to follow up Dr. Aluisio in the office on February 08, 2011, or February 10, 2011, 2 weeks from date of surgery.  Please contact the office at [phone] to help arrange appointment, followup care, and time and transportation for the patient followup.  DISPOSITION:  Pending at the time of dictation but waiting on  skilled bed nursing.  CONDITION ON DISCHARGE:  Pending at the time of dictation, but at that time, she was improving postoperatively.     Alexzandrew L. Perkins, P.A.C.   ______________________________ Frank Aluisio, M.D.    ALP/MEDQ  D:  01/28/2011  T:  01/28/2011  Job:  [id number]  cc:   Fred H. Wilson, M.D. Fax: [phone]  Electronically Signed by ALEXZANDREW PERKINS P.A.C. on 01/31/2011 06:23:37 PM Electronically Signed by FRANK ALUISIO M.D. on 02/09/2011 03:44:43 PM   diet.      ACTIVITY:  She is weightbearing as tolerated to left lower extremity.   Continue with gait training, ambulation, ADLs, range of motion, and   strengthening exercises for total knee protocol.  Please note she may   start showering once she is transferred to the rehab facility but do not   submerge the incision under water, daily dressing change after each   shower.      FOLLOWUP:  She needs to follow up Dr. Lequita Halt in the office on February 08, 2011, or  February 10, 2011, 2 weeks from date of surgery.  Please contact   the office at [phone] to help arrange appointment, followup care, and   time and transportation for the patient followup.      DISPOSITION:  Pending at the time of dictation but waiting on skilled   bed nursing.      CONDITION ON DISCHARGE:  Pending at the time of dictation, but at that   time, she was improving postoperatively.               Alexzandrew L. Julien Girt, P.A.C.         ______________________________   Ollen Gross, M.D.            ALP/MEDQ  D:  01/28/2011  T:  01/28/2011  Job:  [id number]      cc:   Gloriajean Dell. Andrey Campanile, M.D.   Fax: [phone]      Electronically Signed by Patrica Duel P.A.C. on 01/31/2011 06:23:37 PM   Electronically Signed by Ollen Gross M.D. on 02/09/2011 03:44:43 PM

## 2011-03-17 LAB — URINALYSIS, ROUTINE W REFLEX MICROSCOPIC
Bilirubin Urine: NEGATIVE
Glucose, UA: NEGATIVE mg/dL
Hgb urine dipstick: NEGATIVE
Nitrite: NEGATIVE
Protein, ur: NEGATIVE mg/dL
Urobilinogen, UA: 0.2 mg/dL (ref 0.0–1.0)

## 2011-03-17 LAB — BASIC METABOLIC PANEL
BUN: 18 mg/dL (ref 6–23)
BUN: 9 mg/dL (ref 6–23)
Calcium: 8.9 mg/dL (ref 8.4–10.5)
Chloride: 114 mEq/L — ABNORMAL HIGH (ref 96–112)
Creatinine, Ser: 0.53 mg/dL (ref 0.4–1.2)
Creatinine, Ser: 0.85 mg/dL (ref 0.4–1.2)
GFR calc Af Amer: >60 mL/min (ref >60)
GFR calc non Af Amer: >60 mL/min (ref >60)
GFR calc non Af Amer: >60 mL/min (ref >60)
Glucose, Bld: 104 mg/dL — ABNORMAL HIGH (ref 70–99)
Glucose, Bld: 104 mg/dL — ABNORMAL HIGH (ref 70–99)
Potassium: 3.2 mEq/L — ABNORMAL LOW (ref 3.5–5.1)
Potassium: 3.6 mEq/L (ref 3.5–5.1)
Potassium: 3.6 mEq/L (ref 3.5–5.1)
Sodium: 141 mEq/L (ref 135–145)

## 2011-03-17 LAB — VANCOMYCIN, TROUGH: Vancomycin Tr: <5 ug/mL — ABNORMAL LOW (ref 10.0–20.0)

## 2011-03-17 LAB — CBC
HCT: 30.6 % — ABNORMAL LOW (ref 36.0–46.0)
HCT: 32.8 % — ABNORMAL LOW (ref 36.0–46.0)
Hemoglobin: 10.4 g/dL — ABNORMAL LOW (ref 12.0–15.0)
Hemoglobin: 11.2 g/dL — ABNORMAL LOW (ref 12.0–15.0)
MCHC: 33.2 g/dL (ref 30.0–36.0)
MCV: 89.8 fL (ref 78.0–100.0)
MCV: 90.9 fL (ref 78.0–100.0)
Platelets: 129 10*3/uL — ABNORMAL LOW (ref 150–400)
Platelets: 147 10*3/uL — ABNORMAL LOW (ref 150–400)
RBC: 3.37 MIL/uL — ABNORMAL LOW (ref 3.87–5.11)
RBC: 3.37 MIL/uL — ABNORMAL LOW (ref 3.87–5.11)
RDW: 14.3 % (ref 11.5–15.5)
RDW: 14.6 % (ref 11.5–15.5)
RDW: 14.7 % (ref 11.5–15.5)
WBC: 15 10*3/uL — ABNORMAL HIGH (ref 4.0–10.5)
WBC: 16 10*3/uL — ABNORMAL HIGH (ref 4.0–10.5)

## 2011-03-17 LAB — COMPREHENSIVE METABOLIC PANEL
Albumin: 2.8 g/dL — ABNORMAL LOW (ref 3.5–5.2)
Albumin: 2.9 g/dL — ABNORMAL LOW (ref 3.5–5.2)
Alkaline Phosphatase: 39 U/L (ref 39–117)
Alkaline Phosphatase: 54 U/L (ref 39–117)
BUN: 14 mg/dL (ref 6–23)
BUN: 24 mg/dL — ABNORMAL HIGH (ref 6–23)
CO2: 26 mEq/L (ref 19–32)
Chloride: 101 mEq/L (ref 96–112)
Chloride: 112 mEq/L (ref 96–112)
Creatinine, Ser: 1.24 mg/dL — ABNORMAL HIGH (ref 0.4–1.2)
GFR calc non Af Amer: >60 mL/min (ref >60)
Glucose, Bld: 131 mg/dL — ABNORMAL HIGH (ref 70–99)
Glucose, Bld: 94 mg/dL (ref 70–99)
Potassium: 3 mEq/L — ABNORMAL LOW (ref 3.5–5.1)
Potassium: 3.2 mEq/L — ABNORMAL LOW (ref 3.5–5.1)
Total Bilirubin: 1.3 mg/dL — ABNORMAL HIGH (ref 0.3–1.2)
Total Bilirubin: 1.6 mg/dL — ABNORMAL HIGH (ref 0.3–1.2)
Total Protein: 4.9 g/dL — ABNORMAL LOW (ref 6.0–8.3)

## 2011-03-17 LAB — URINE MICROSCOPIC-ADD ON

## 2011-03-17 LAB — BLOOD GAS, ARTERIAL
Bicarbonate: 23.6 mEq/L (ref 20.0–24.0)
Bicarbonate: 25.2 mEq/L — ABNORMAL HIGH (ref 20.0–24.0)
Drawn by: 103701
FIO2: 0.21 %
O2 Saturation: 87.7 %
Patient temperature: 98.6
TCO2: 21.7 mmol/L (ref 0–100)
pH, Arterial: 7.442 — ABNORMAL HIGH (ref 7.350–7.400)
pH, Arterial: 7.51 — ABNORMAL HIGH (ref 7.350–7.400)
pO2, Arterial: 74.7 mmHg — ABNORMAL LOW (ref 80.0–100.0)

## 2011-03-17 LAB — MAGNESIUM: Magnesium: 1.5 mg/dL (ref 1.5–2.5)

## 2011-03-17 LAB — EXPECTORATED SPUTUM ASSESSMENT W GRAM STAIN, RFLX TO RESP C

## 2011-03-17 LAB — CULTURE, RESPIRATORY W GRAM STAIN

## 2011-03-17 LAB — PHOSPHORUS
Phosphorus: 1.8 mg/dL — ABNORMAL LOW (ref 2.3–4.6)
Phosphorus: 1.9 mg/dL — ABNORMAL LOW (ref 2.3–4.6)
Phosphorus: 2.6 mg/dL (ref 2.3–4.6)

## 2011-03-17 LAB — CULTURE, BLOOD (ROUTINE X 2)

## 2011-03-17 LAB — DIFFERENTIAL
Basophils Absolute: 0 10*3/uL (ref 0.0–0.1)
Eosinophils Relative: 0 % (ref 0–5)
Lymphocytes Relative: 4 % — ABNORMAL LOW (ref 12–46)
Lymphs Abs: 0.6 10*3/uL — ABNORMAL LOW (ref 0.7–4.0)
Neutro Abs: 14.8 10*3/uL — ABNORMAL HIGH (ref 1.7–7.7)
Neutrophils Relative %: 93 % — ABNORMAL HIGH (ref 43–77)

## 2011-03-17 LAB — URINE CULTURE

## 2011-03-17 LAB — D-DIMER, QUANTITATIVE: D-Dimer, Quant: 1.59 ug/mL-FEU — ABNORMAL HIGH (ref 0.00–0.48)

## 2011-03-17 LAB — AFB CULTURE WITH SMEAR (NOT AT ARMC)

## 2011-04-19 NOTE — H&P (Signed)
[name], [name] NO.:  [id number]   MEDICAL RECORD NO.:  [id number]          PATIENT TYPE:  INP   LOCATION:  NA                           FACILITY:  [facility]   PHYSICIAN:  Ollen Gross, M.D.    DATE OF BIRTH:  1930/01/09   DATE OF ADMISSION:  12/12/2007  DATE OF DISCHARGE:                              HISTORY & PHYSICAL   DATE OF OFFICE VISIT HISTORY AND PHYSICAL:  November 23, 2007   CHIEF COMPLAINT:  Right knee pain.   HISTORY OF PRESENT ILLNESS:  The patient is a 77 year old female who has  been seen by Dr. Lequita Halt for ongoing knee pain; the right knee has been  bothering her for quite some time now.  She has bilateral knee pain and  significant arthritis in both, but the right is more symptomatic and it  has been ongoing for several years now.  It has been giving out and  popping.  She does have neuropathy.  She is seen in the office, where x-  rays show significant arthritis.  She has undergone injections in the  past with only temporary benefit.  She has reached a point now where she  would like to have something done.  Risks and benefits of surgery have  been discussed; she elects to proceed with total knee replacement.   ALLERGIES:  NOVOCAIN causes facial blisters; GOLD SHOTS, did not  tolerate.   CURRENT MEDICATIONS:  The patient did not recall her medications and did  not bring them with her; she will bring them to the hospital.   PAST MEDICAL HISTORY:  1. Neuropathy.  2. Skin cancer.  3. Hypertension.  4. Reflux disease.  5. Stress urinary incontinence.   PAST SURGICAL HISTORY:  1. Tonsillectomy.  2. Appendectomy.  3. Cesarean section.  4. Gallbladder.   SOCIAL HISTORY:  Married, retired, nonsmoker, no alcohol.  Four children   FAMILY HISTORY:  Skin cancer, fibromyalgia, neuropathy.   REVIEW OF SYSTEMS:  GENERAL:  No fevers, chills or night sweats.  NEURO:  She does have neuropathy, especially with hands and feet.  No seizures  or  syncope.  RESPIRATORY:  No shortness of breath, productive cough or  hemoptysis.  CARDIOVASCULAR:  No chest pain,angina or orthopnea.  GI:  No nausea, vomiting, diarrhea or constipation.  GU:  No dysuria or  hematuria.  She does have stress urinary incontinence.  MUSCULOSKELETAL:  Right knee.   PHYSICAL EXAMINATION:  VITAL SIGNS:  Pulse 68, respirations 12, blood  pressure 104/62.  GENERAL:  A 77 year old white female, well nourished, tall frame, well  developed, in no acute distress, alert, oriented and cooperative.  HEENT:  Normocephalic, atraumatic.  Pupils are round and reactive.  Oropharynx clear.  EOMs intact.  NECK:  Supple.  CHEST:  Clear, anterior and posterior chest walls.  HEART:  Regular rate and rhythm.  No murmur.  S1-S2 noted.  ABDOMEN:  Soft, slightly round.  Bowel sounds are present.  RECTAL, BREASTS AND GENITALIA:  Not done, not pertinent to present  illness.  EXTREMITIES:  Right knee:  Range of motion 5-125, marked crepitus,  no  instability.   IMPRESSION:  Osteoarthritis, right knee.   PLAN:  The patient is admitted to [facility] to undergo a  right total knee arthroplasty.  Surgery will be performed by Ollen Gross.      Alexzandrew L. Perkins, P.A.C.      Ollen Gross, M.D.  Electronically Signed    ALP/MEDQ  D:  12/11/2007  T:  12/12/2007  Job:  [id number]   cc:   Gloriajean Dell. Andrey Campanile, M.D.  Fax: [phone]

## 2011-04-19 NOTE — H&P (Signed)
NAMEMarland Kitchen  PURVI, RUEHL NO.:  [id number]   MEDICAL RECORD NO.:  [id number]          PATIENT TYPE:  EMS   LOCATION:  ED                           FACILITY:  [facility]   PHYSICIAN:  Vania Rea, M.D. DATE OF BIRTH:  [birth year]   DATE OF ADMISSION:  02/27/2009  DATE OF DISCHARGE:                              HISTORY & PHYSICAL   PRIMARY CARE PHYSICIAN:  Gloriajean Dell. Andrey Campanile, M.D.   NEUROLOGIST:  Marlan Palau, M.D.   CHIEF COMPLAINT:  This is a 77 year old Caucasian lady with a history of  hypertension, anxiety and neuropathy who reports that she has been  having a cough for about a week now.  Has not really noticed any fever  or chills until today when she started having uncontrolled shaking and a  persistent worsening, nonproductive cough and went to an Urgent Care  where she was evaluated and had a chest x-ray done which showed  pneumonia and she was transferred to our emergency room for further  evaluation.   In the emergency room the patient has been reevaluated and the  hospitalist service called to assist with management.  Her Urgent Care  notes the patient was somewhat confused.  Currently the patient is less  confused and able to give a history, but is unsure of her medications.  She denies any fevers.  She denies only this one episode of shaking  which she thought was an aggravation of her baseline neuropathy.  She  denies any production to her cough.  She denies any chest pain.  She is  short of breath.  She denies any nausea or vomiting or diarrhea.  She  denies any dysuria.   PAST MEDICAL HISTORY:  1. Hypertension.  2. History of GERD.  3. History of anemia.  4. History of osteoarthritis.  5. History of peripheral neuropathy.  6. 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  7. Past history of skin cancers.   PAST SURGICAL HISTORY:  Knee replacement.   MEDICATIONS:  1. Lasix which she says she takes sometimes and sometimes not.  2.  Aspirin 325 mg daily.  3. Zoloft she believes it is 50 mg daily.  4. Lyrica, unknown dose, she takes regularly twice daily.  5. Zestoretic, unknown dose.  List in the computer is 10/12.5 daily.  6. Wellbutrin unknown dose.  She takes sometimes and sometimes she      does not.  7. Zocor she takes at bedtime; listed as 20 mg.  8. Trazodone, she takes 2 tablets at bedtime to help her sleep.  Her      husband thinks it is two 100 mg tablets.   ALLERGIES:  Computerized records indicate that NOVOCAIN causes  blistering of her lips and she is intolerant to GOLD injection.   FAMILY HISTORY:  Significant for mother who had colon cancer and brother  with diabetes.   SOCIAL HISTORY:  There is no history of tobacco, alcohol or illicit drug  use.  She lives at home with her husband.   REVIEW OF SYSTEMS:  Review of systems other than noted above, a  10-point  review of systems is unremarkable.   PHYSICAL EXAMINATION:  GENERAL:  This is an elderly, well-nourished  Caucasian lady reclining in the stretcher.  She has coarse generalized  direct tremor which she says is her baseline.  She is alert and oriented  to date, time and place and reason for being hospital.  VITAL SIGNS:  Temperature is 100.6 was recorded 102.6 at Urgent Care.  Her pulse is 120, respiration 15, blood pressure 113/57.  She is  saturating at 97% on 2 liters.  HEENT:  Her pupils are round and equal.  Mucous membranes pink.  Anicteric.  NECK:  No cervical lymphadenopathy or thyromegaly.  No jugular venous  distention.  CHEST:  She has decreased breath sounds left base.  CARDIOVASCULAR:  She is tachycardiac and the rhythm seems to be regular.  ABDOMEN:  Slightly obese but soft and nontender.  No masses.  EXTREMITIES:  Without edema.  She has 2+ dorsalis pedis pulses  bilaterally.  CNS:  Cranial nerves II-XII appear grossly intact and she has coarse  generalized tremor.  Power is grade 5 throughout.   LABORATORY DATA:  White  count is 16,000, platelets 147,000.  Absolute  granulocyte count is 14.8.  Her blood gas on room air pH is 7.51, pCO2  32, pO2 51, saturating at 87.7%.  Her serum chemistry significant for  potassium 3.2, glucose of 104, BUN 18, creatinine 0.5.  The chest x-ray  shows normal heart size,  clear right lung,  no effusions and left lower  lobe airspace disease consistent with pneumonia.  .   ASSESSMENT:  1. Sepsis as evidenced by a leukocytosis, fever, chills, and episodic      confusion with left lower lobe infiltrate on chest x-ray.  2. Acute respiratory failure  3. Left lower lobe pneumonia causing the above.  4. Hypokalemia.  5. History of hypertension.  6. History of nonsteroidal anti-inflammatory drugs.  7. History of peripheral neuropathy.  8. Dehydration as evidenced by elevated ZOX:[id number] ratio.  9. History of gastroesophageal reflux disease.   PLAN:  Will admit this lady for her acute respiratory failure,  pneumonia, treat her with oxygen supplementation, IV antibiotics and  advised her husband to bring in her medications so we can verify her  medication list.  For the time being, we will only treat with the  essential home meds until the list is verified.  Other plans as per  orders      Vania Rea, M.D.  Electronically Signed     LC/MEDQ  D:  02/27/2009  T:  02/28/2009  Job:  [id number]   cc:   Gloriajean Dell. Andrey Campanile, M.D.  Fax: [phone]

## 2011-04-19 NOTE — Op Note (Signed)
[name], [name] NO.:  [id number]   MEDICAL RECORD NO.:  [id number]          PATIENT TYPE:  INP   LOCATION:  0008                         FACILITY:  [facility]   PHYSICIAN:  Ollen Gross, M.D.    DATE OF BIRTH:  08-Oct-1930   DATE OF PROCEDURE:  12/12/2007  DATE OF DISCHARGE:                               OPERATIVE REPORT   PREOPERATIVE DIAGNOSIS:  Osteoarthritis right knee.   POSTOPERATIVE DIAGNOSIS:  Osteoarthritis right knee.   PROCEDURE:  Right total knee arthroplasty.   SURGEON:  Ollen Gross, M.D.   ASSISTANT:  Alexzandrew L. Perkins, P.A.C.   ANESTHESIA:  General.   ESTIMATED BLOOD LOSS:  Minimal.   DRAINS:  None.   TOURNIQUET TIME:  Thirty-nine minutes at 300 mmHg.   COMPLICATIONS:  None.   CONDITION:  Stable to recovery.   BRIEF CLINICAL NOTE:  [name] is a 77 year old female with end-  stage arthritis both knees, right more symptomatic than the left.  She  has failed nonoperative management including injections.  She presents  for total knee arthroplasty.   PROCEDURE IN DETAIL:  After successful administration of general  anesthetic a tourniquet was placed on the right thigh and right lower  extremity prepped and draped in the usual sterile fashion.  The  extremity was wrapped in Esmarch, knee flexed, tourniquet inflated to  300 mmHg.  Midline incision was made with a 10 blade through  subcutaneous tissue to the level of the extensor mechanism.  I made a  lateral parapatellar arthrotomy given her significant valgus deformity.  Soft tissue of the proximal lateral tibia was then subperiosteally  elevated to the joint line all the way to the posterolateral corner not  including the structures of the posterolateral corner.  Patella was  everted medially, knee flexed 90 degrees, PCL was removed.  The ACL was  no longer intact.  A drill was used to create a starting hole in the  distal femur and the canal was thoroughly irrigated.  The 5  degree right  valgus alignment guide was placed referencing off the posterior  condyles, rotations marked and blocked, pins removed 11 mm off the  distal femur.  I took 11 because of a preop flexion contracture.  Distal  femoral resection is made with an oscillating saw.  Sizing blocks  placed, size 4 was most appropriate.  Rotations marked off the  epicondylar axis.  The size 4 cutting block was placed.  The anterior,  posterior and chamfer cuts are made.   Tibia subluxed forward and the menisci are removed.  The extramedullary  tibial alignment guide is placed referencing proximally at the medial  aspect of the tibial tubercle and distally along the second metatarsal  axis and tibial crest.  The blocks pinned to remove about 4 mm of the  more deficient lateral side.  Tibial resection is made with an  oscillating saw.  A size 4 was the most appropriate tibial component and  the proximal tibia prepared with modular drill and keel punch for a size  4.  Femoral preparation was completed with intercondylar cut.  Size 4 mobile bearing tibial trial and size 4 posterior stabilizing  femoral trial and a 10 mm posterior stabilized rotating platform insert  trial are placed.  With the 10 we were a little lax in flexion and  extension so I went to 12.5 which allowed for full extension with  excellent varus and valgus anterior and posterior balance throughout,  full range of motion.  Her valgus deformity was corrected.  The patella  was then everted.  It was measured to be 17 mm.  Freehand resection was  taken to 12 mm, a 38 template was placed and lug holes were drilled,  trial patella was placed and it tracks normally.  Osteophytes were  removed and posterior femur with the trial in place.  All trials are  removed and the cut bone surfaces are prepared with pulsatile lavage.  Cement is mixed and once ready for implantation the size 4 mobile  bearing tibial tray, size 4 posterior stabilized  femur and 38 patella  are cemented into place.  The patella was held with a clamp.  Trial 12.5  inserts were placed and knee held in full extension and all extruded  cement removed.  When the cement had fully hardened then the permanent  12.5 mm posterior stabilized rotating platform insert was placed into  the tibial tray.  The wound is copiously irrigated with saline solution.  FloSeal had been injected in the posterior capsule and then injected in  the lateral gutter.  Moist sponge is placed and tourniquet released for  total time of 39 minutes.  The sponge is held for 2 minutes, removed and  then bleeding stopped with electrocautery.  The wound is again irrigated  and the arthrotomy then closed with interrupted #1 PDS.  I left open a  small area from the superior to inferior pole of patella to serve as a  mini lateral release.  Flexion against gravity was 140 degrees with the  patella tracking normally.  Subcu was closed with interrupted 2-0 Vicryl  and subcuticular with running 4-0 Monocryl.  The incision is cleaned and  dried and Steri-Strips and bulky sterile dressing applied.  She was then  placed into knee immobilizer, awakened and transferred to recovery in  stable condition.      Ollen Gross, M.D.  Electronically Signed     FA/MEDQ  D:  12/12/2007  T:  12/12/2007  Job:  [id number]

## 2011-04-19 NOTE — Discharge Summary (Signed)
NAMEEDDY, TERMINE              ACCOUNT NO.:  [id number]   MEDICAL RECORD NO.:  [id number]          PATIENT TYPE:  INP   LOCATION:  1441                         FACILITY:  [facility]   PHYSICIAN:  Monte Fantasia, MD  DATE OF BIRTH:  08-11-1930   DATE OF ADMISSION:  02/27/2009  DATE OF DISCHARGE:  03/04/2009                               DISCHARGE SUMMARY   PRIMARY CARE PHYSICIAN:  Dr. Benedetto Goad.   NEUROLOGIST:  Dr. Lesia Sago.   DISCHARGE DIAGNOSES:  1. Acute respiratory failure.  2. Bilateral community-acquired pneumonia.  3. Urinary tract infection.  4. Hypophosphatemia.  5. Hypoxia which has resolved, secondary to pneumonia.  6. Leukocytosis which has resolved, secondary to pneumonia.  7. History of peripheral neuropathy.  8. Hypertension.  9. Sepsis secondary to pneumonia which is resolved.   MEDICATIONS UPON DISCHARGE:  1. Avelox 400 mg p.o. daily for 2 more days.  2. Xanax 0.25 mg b.i.d.  3. Aspirin 325 mg p.o. daily.  4. Hydrochlorothiazide 12.5 mg p.o. daily.  5. Lisinopril 10 mg p.o. daily.  6. Prevacid 30 mg p.o. daily.  7. Potassium chloride 40 mg p.o. daily.  8. Lyrica 50 mg p.o. b.i.d.  9. Senna 1 tablet p.o. every night.  10.Zoloft 50 mg p.o. daily.  11.Zocor 20 mg p.o. daily.  12.Trazodone 50 mg p.o. every night.   COURSE DURING THE HOSPITAL STAY:  I have been taking care of this  patient today, chart reviewed and the course of hospital stay is as  follows:  A 77 year old Caucasian lady patient with a history of  hypertension, anxiety and neuropathy who was admitted on February 27, 2009,  with complaints of fever, chills and worsening but nonproductive cough.  Chest x-ray done in the ER showed left lower lobe infiltrate.  The  patient initially was started on Rocephin and Zithromax.  However, in  view of acute respiratory failure with hypoxia, antibiotics were changed  to IV vancomycin and Zosyn.  The patient has received vancomycin and  Zosyn for the  past 5 days and has improved well.  Hypoxia at present is  resolved, and the patient is symptomatically much better.  Leukocytosis  has improved from 18.7 to 9.5, last on March 02, 2009.  The patient at  present is 94% on room air and in no respiratory distress, and the  patient is medically stable to be discharged.  As per discussion with  ID, the patient can be discharged, IV Zosyn and vancomycin have been  discontinuedand blood cultures have been no growth to date.  Respiratory  cultures did show Candida albicans moderate.  AFB smears have been  negative, and urine cultures have been no growth.  The patient's  antibiotics, IV Zosyn and vancomycin, were discontinued and changed to  Avelox.  The patient at present is medically stable to be discharged and  can be discharged home today on the medications as dictated above.   RADIOLOGICAL INVESTIGATIONS:  Done during the stay in the hospital,  chest x-ray done on February 27, 2009 - impression:  Left lower lobe  pneumonia.  CT angio done on  February 28, 2009 - impression:  Bilateral  bronchopneumonia, pronounced more on the left lower lobe.  Abdominal x-ray done on February 28, 2009 - bowel gas pattern unremarkable  without any evidence of ileus, obstruction or free air.  There are clips  in the right upper quadrant, presumably past cholecystectomy.  No  worrisome calcifications or bony findings.  Pneumonia in the left lower  lobe evident.  Chest x-ray done on March 04, 2009 - impression:  Persistent but  slightly improved left basilar air space disease with small left pleural  effusion; new right pleural effusion seen.   Last labs done prior to discharge:  CBC done on March 02, 2009:  Total  WBC 9.5, hemoglobin 10.2, hematocrit 30.6, platelets of 124.  BMP done  on February 14, 2009:  Sodium of 141, potassium 3.6, chloride 114, bicarb  25, glucose 91, BUN 8, creatinine 0.6, calcium of 8.7, phosphorus 2.6.  UA done on admission:  WBC 3-6 with  leukocytes small and nitrite  negative.  Mycoplasma antibody IgM is negative.  Respiratory cultures:  Moderate Candida albicans.  Sputum culture normal, normal flora.  Legionella urine antigen is negative.  Urine cultures have been no  growth, and blood cultures have been no growth for 5 days.   DISPOSITION:  The patient at present is medically stable to be  discharged home and is recommended to follow up with her primary care  physician in the next 1-2 weeks.   Total time for discharge is 40 minutes.      Monte Fantasia, MD  Electronically Signed     MP/MEDQ  D:  03/04/2009  T:  03/04/2009  Job:  [id number]   cc:   Gloriajean Dell. Andrey Campanile, M.D.  Fax: [phone]

## 2011-04-22 NOTE — Op Note (Signed)
[name], [name]              ACCOUNT NO.:  [id number]   MEDICAL RECORD NO.:  [id number]          PATIENT TYPE:  AMB   LOCATION:  DSC                          FACILITY:  [facility]   PHYSICIAN:  Dionne Ano. Gramig III, M.D.DATE OF BIRTH:  Aug 28, 1930   DATE OF PROCEDURE:  09/30/2004  DATE OF DISCHARGE:                                 OPERATIVE REPORT   PREOPERATIVE DIAGNOSES:  1.  Left carpal tunnel syndrome.  2.  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   POSTOPERATIVE DIAGNOSES:  1.  Left carpal tunnel syndrome.  2.  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   PROCEDURES:  1.  Left __________ carpal tunnel.  2.  Left carpometacarpal arthroscopy about the base of thumb joint (mover of      the trapezium at the base of the thumb joint, left thumb).  3.  Abductor pollicis longus digastric portion tendon transfer to the first      metacarpal flexor carpi radialis and back upon itself (Zancolli tendon      transfer), left thumb.  4.  Abductor pollicis longus one-third proper portion tendon transfer to the      flexor carpi radialis, abductor pollicis longus proper, and back upon      themselves with multiple figure-of-eight throws (Weilby tendon      transfer/suspension), left thumb.  5.  Abductor pollicis longus tenodesis at the wrist level (shortening of the      wrist extensor at the wrist level), left wrist region.   SURGEON:  Dionne Ano. Amanda Pea, M.D.   ASSISTANT:  Karie Chimera, P.A.-C.   COMPLICATIONS:  None.   ANESTHESIA:  Infraclavicular block.   TOURNIQUET TIME:  Less than an hour.   ESTIMATED BLOOD LOSS:  Minimal.   INDICATION FOR PROCEDURE:  This patient is a very pleasant female who  presents with the above-mentioned diagnoses.  I have counseled her in regard  to the risks and benefits of surgery, including the risks of infection,  bleeding, anesthesia, damage to normal structures, and failure of surgery to  accomplish its intended goals of relieving  symptoms and restoring function.  With this in mind, she desires to proceed.  All questions have been  encouraged and answered preoperatively.   OPERATIVE PROCEDURE IN DETAIL:  The patient was seen by myself and  anesthesia, given infraclavicular block under the direction of Quita Skye.  Krista Blue, M.D., was given preoperative Ancef, following this was taken to the  operative suite.  Underwent a sterile prep and drape about the left upper  extremity after examination under anesthesia was performed.  Once this was  done, the patient then underwent elevation of the arm.  The tourniquet was  insufflated to 250 mmHg and under sterile field, I performed a 1 cm  incision.  Dissection was carried down, the palmar fascia incised.  Following this, the distal edge of the transverse carpal ligament was  released under 4.0 loupe magnification, fat pad addressed.  Distal to  proximal dissection was carried out until adequate room was available for  canal preparatory device one, two, and three, which were  placed directly in  the midline without patient problems.  I then placed the security clip and  following this the security knife and the security clip, effectively  releasing the proximal leaf of the transverse carpal ligament.  The median  nerve was completely released in terms of the pressure from the transverse  carpal ligament.  There was no iatrogenic nerve injury.  I deflated the  tourniquet and obtained hemostasis, irrigated, and closed the wound.  Tourniquet time was less than 15 minutes.  Following this, the tourniquet  was reinsufflated and incision was made dorsally about the [facility] joint.  The  patient had a dissection interval between the EPB and APL accomplished.  The  capsule was split.  I then identified the trapezium and excised this  piecemeal.  I performed an FCR tenolysis and following this performed a  drill hole placement from dorsal to palmar extending intra-articularly in  line with  the palmar oblique ligament.  The superficial radial nerve and the  deep branch of the radial artery were identified and protected at all times.  This completed the arthroplasty/trapezium excision portion of the procedure.   Once this was done, the patient then had harvesting through a one-inch  incision dorsal radially approximately three inches proximal to the wrist of  the APL digastric portion and a one-third APL proper portion.  These were  severed proximally and allowed to retract distally and kept, of course,  harnessed to the metacarpal.  Following this, the APL digastric portion was  taken against the metacarpal through the drill hole dorsal to palmar around  the FCR and through an additional slit in the FCR and then back upon the APL  proper and was inset with 4-0 Fibrewire suture.  This completed the Zancolli  tendon transfer.   Following this, the APL one-third proper portion was placed in a figure-of-  eight fashion around the FCR and APL proper.  This was done with multiple  figure-of-eight throws and then inset with a 4-0 Fibrewire.  This completed  the [facility] tendon transfer.  Following this, the additional tendon ends were  anchovied into place to use as space fillers, and the APL tenodesis was  accomplished, which was a shortening of the APL at the wrist level to  prevent MCP hyperextension.  This was done without difficulty utilizing 4-0  Fibrewire.  I then repaired the capsule, following this deflated the  tourniquet, irrigated copiously, and once this was done, closed the wound  with the tourniquet down and hemostasis complete.  Following this, the  patient had had a sterile dressing applied followed by a thumb spica splint.  She tolerated this well and was transferred to the recovery room in stable  condition.  All sponge, instrument, and needle counts were reported as correct.  It has been a pleasure to participate in her care, and we look  forward to participating  in her postop recovery.       WMG/MEDQ  D:  09/30/2004  T:  10/01/2004  Job:  [id number]

## 2011-04-22 NOTE — Discharge Summary (Signed)
[name], [name] NO.:  [id number]   MEDICAL RECORD NO.:  [id number]          PATIENT TYPE:  INP   LOCATION:  1605                         FACILITY:  [facility]   PHYSICIAN:  Ollen Gross, M.D.    DATE OF BIRTH:  1930/06/22   DATE OF ADMISSION:  12/12/2007  DATE OF DISCHARGE:  12/17/2007                               DISCHARGE SUMMARY   ADMISSION DIAGNOSES:  1. Osteoarthritis right knee.  2. Neuropathy.  3. Skin cancer.  4. Hypertension.  5. Reflux disease.  6. Stress urinary incontinence.   DISCHARGE DIAGNOSES:  1. Osteoarthritis right knee status post right total knee      arthroplasty.  2. Acute blood loss anemia.  3. Status post transfusion without sequelae.  4. Hyponatremia improved.  5. Postoperative hypokalemia improved.  6. Osteoarthritis right knee.  7. Neuropathy.  8. Skin cancer.  9. Hypertension.  10.Reflux disease.  11.Stress urinary incontinence.   PROCEDURE:  Right total knee arthroplasty on December 12, 2007.   SURGEON:  Ollen Gross, M.D.   ASSISTANT:  Alexzandrew L. Perkins, P.A.C.   ANESTHESIA:  General.   CONSULTATIONS:  None.   BRIEF HISTORY:  Ms. Cartaya is a 77 year old female with end-state  arthritis of both knees, right more symptomatic than left which failed  nonoperative management including injections and now presents for a  total knee arthroplasty .   LABORATORY DATA:  Preop CBC showed a hemoglobin of 13.5, hematocrit of  38.8, white cell count 8.0.  Initial hemoglobin 9.6, drifted down to  7.5, given blood back up to 9.5, last H and H is 9.3 and 26.9.  PT and  PTT on admission 12.2 and 27 respectively.  INR is 0.9.  Serial pro-  times were followed.  PT and INR 25.0 and 2.2.  Chem panel on admission  all within normal limits.  Serial B-mets were followed and sodium did  drop from 142 down to 131, back up to 138.  Potassium dropped to 3.5 and  down to 3.3 back up to 4.3.  Preop UA:  Small leukocyte esterase,  rare  epithelial cells, rare bacteria and hyaline casts, blood type O  negative.   EKG:  December 10, 2007 - normal sinus rhythm, nonspecific ST wave  abnormality.Since last tracing nonspecific ST abnormality, new,  otherwise, no significant change confirmed by Dr. Aggie Cosier.   2-D chest December 10, 2007 - Mild stable chronic bronchitic changes, no  evidence of active chest disease.   HOSPITAL COURSE:  The patient was admitted to [facility].  She  tolerated the procedure well.  Patient taken from recovery room to the  orthopedic floor, started on p.c. and p.o. analgesics, started getting  Coumadin for DVT prophylaxis.  Seen on rounds on day #1, doing fairly  well.  Her blood pressure medications were restarted but placed on  parameters.  She had a little bit of a drop in her hemoglobin at that  time but was asymptomatic with it.  By day #2, she was doing a little  bit better but blood count was down to 7.5. Therefore, it  was  recommended she get transfusion.  Discontinue the PCA, give her 2 units  of blood.  Potassium was also down.  Put her on potassium supplements.  By day #3, she is doing much better.  Potassium was coming up.  The  sodium was down.  She was a little bit better after undergoing the  transfusion, and she was up and ambulating on her feet. Unfortunately,  over the weekend, her sodium dropped and had to be placed on fluid  restrictions, had a little bit of redness over her wounds.  Both those  were watched.  Her sodium did improve with fluid restrictions.  The  redness improved.  She was seen back on  December 17, 2007, doing well.  Incision looked okay.  Redness improved.  Her sodium and potassium had  corrected; she was  tolerating her therapy.   DISCHARGE PLAN:  The patient is discharged home on December 17, 2007.   DISCHARGE DIAGNOSES:  Please see above.   DISCHARGE MEDICATIONS:  Percocet, Nu-Iron, Robaxin, Coumadin.   ACTIVITY:  Weightbearing as  tolerated   PLAN:  Total knee protocol.  Home PT and OT, home nursing.  follow up in  two weeks.   DISPOSITION:  Home.   CONDITION ON DISCHARGE:  Improved.      Alexzandrew L. Perkins, P.A.C.      Ollen Gross, M.D.  Electronically Signed    ALP/MEDQ  D:  01/22/2008  T:  01/22/2008  Job:  [id number]   cc:   Gloriajean Dell. Andrey Campanile, M.D.  Fax: [phone]

## 2011-04-22 NOTE — Op Note (Signed)
   NAME:  [name], [name]                     ACCOUNT NO.:  [id number]   MEDICAL RECORD NO.:  [id number]                   PATIENT TYPE:  AMB   LOCATION:  ENDO                                 FACILITY:  [facility]   PHYSICIAN:  James L. Malon Kindle., M.D.          DATE OF BIRTH:  [birth year]   DATE OF PROCEDURE:  04/29/2003  DATE OF DISCHARGE:                                 OPERATIVE REPORT   PROCEDURE:  Colonoscopy.   MEDICATIONS:  1. Fentanyl 75 micrograms.  2. Versed 5 mg IV.   INDICATIONS FOR PROCEDURE:  The patient is a 77 year old woman with a strong  family history of colon cancer with recent rectal bleeding. Her mother had  colon cancer.   DESCRIPTION OF PROCEDURE:  The procedure had been explained to the patient  and consent was obtained. The patient was placed in the left lateral  decubitus position and the Olympus scope was inserted and advanced. The prep  was quite good. Using abdominal pressure and position changes we were able  to advance over to the cecum. The cecum was identified  by identification of  the ileocecal valve and the appendiceal orifice. The right lower quadrant  was transilluminated.   The scope was withdrawn and the cecum, ascending colon, transverse colon,  descending colon and sigmoid colon was seen well. There was no significant  diverticular disease. No polyps were seen. The scope was withdrawn down into  the rectum and the rectum was free of polyps. In the retroflex view the  patient was seen to have internal hemorrhoids.   The scope was withdrawn. The patient tolerated the procedure well.   ASSESSMENT:  1. Rectal bleeding, probably due to internal hemorrhoids.  2. Strong family history of colon cancer.    PLAN:  Will give a hemorrhoid instruction sheet, fiber supplements. Will  recommend repeating colonoscopy in 5 years. Would recommend yearly  Hemoccults.                                               James L. Malon Kindle., M.D.    Waldron Session  D:  04/29/2003  T:  04/29/2003  Job:  [id number]   cc:   Gretta Cool, M.D.  [address] [place]  [place] [address]  Fax: [phone]   Teena Irani. Arlyce Dice, M.D.  [address]  [place]  [place] [address]  Fax: [phone]

## 2011-07-07 DIAGNOSIS — D249 Benign neoplasm of unspecified breast: Secondary | ICD-10-CM | POA: Insufficient documentation

## 2011-07-11 ENCOUNTER — Other Ambulatory Visit: Payer: Self-pay | Admitting: Gynecology

## 2011-07-11 DIAGNOSIS — N6321 Unspecified lump in the left breast, upper outer quadrant: Secondary | ICD-10-CM

## 2011-07-18 ENCOUNTER — Other Ambulatory Visit: Payer: Self-pay | Admitting: Gynecology

## 2011-07-18 ENCOUNTER — Ambulatory Visit
Admission: RE | Admit: 2011-07-18 | Discharge: 2011-07-18 | Disposition: A | Discharge status: Home / Self Care | Payer: Medicare Other | Source: Ambulatory Visit | Attending: Gynecology | Admitting: Gynecology

## 2011-07-18 DIAGNOSIS — N6321 Unspecified lump in the left breast, upper outer quadrant: Secondary | ICD-10-CM

## 2011-08-02 ENCOUNTER — Other Ambulatory Visit (INDEPENDENT_AMBULATORY_CARE_PROVIDER_SITE_OTHER): Payer: Self-pay | Admitting: Surgery

## 2011-08-02 ENCOUNTER — Encounter (INDEPENDENT_AMBULATORY_CARE_PROVIDER_SITE_OTHER): Payer: Self-pay | Admitting: Surgery

## 2011-08-02 ENCOUNTER — Ambulatory Visit (INDEPENDENT_AMBULATORY_CARE_PROVIDER_SITE_OTHER): Payer: Medicare Other | Admitting: Surgery

## 2011-08-02 VITALS — BP 126/78 | HR 70 | Temp 96.4°F | Ht 70.0 in | Wt 187.2 lb

## 2011-08-02 DIAGNOSIS — N63 Unspecified lump in unspecified breast: Secondary | ICD-10-CM

## 2011-08-02 NOTE — Patient Instructions (Signed)
We will schedule you. for surgery to remove the small area in your left breast. We will have to have a guidewire placed at the breast Center prior to surgery. Our office will make those arrangements. If you have any questions please call us.

## 2011-08-02 NOTE — Progress Notes (Signed)
CC: Breast mass HPI: This patient recently had a breast exam with a small nodule being found by her GYN provider. This was near the areolar margin. Further evaluation including mammogram and ultrasound showed a cystic lesion with an otherwise negative mammogram. A biopsy is reported to have shown a papilloma. The palpable mass disappeared after the biopsy.  The patient's had no breast symptoms or other problems.  ROS: A 12 point review of systems is essentially negative except for some issues with constipation. She has had a skin cancer. She's had fibromyalgia. She apparently had a "double pneumonia "following a endoscopic procedure last year.  MEDS: No current outpatient prescriptions on file.     ALLERGIES:No Known Allergies    PE GENERAL: The patient is alert, oriented, and generally healthy-appearing, NAD. Mood and affect are normal.  HEENT: The head is normocephalic, the eyes nonicteric, the pupils were round regular and equal. EOMs are normal. Pharynx normal. Dentition good.  NECK: The neck is supple and there are no masses or thyromegaly.  LUNGS: Normal respirations and clear to auscultation.  HEART: Regular rhythm, with no murmurs rubs or gallops. Pulses are intact carotid dorsalis pedis and posterior tibial. No significant varicosities are noted.AXILLA: There is no axillary or supraclavicular adenopathy noted.  BREASTS:The breasts are symmetric in appearance. There is no evidence of a mass. The area in the left breast the biopsy was performed shows a very minimal ecchymosis. However no other abnormality can be detected by physical exam today.  LYMPHATICS: There is no axillary or supraclavicular adenopathy noted.   ABDOMEN: Soft, flat, and nontender. No masses or organomegaly is noted. No hernias are noted. Bowel sounds are normal.  EXTREMITIES: Good range of motion, no edema.   Data Reviewed I've reviewed her mammogram and ultrasound reports. 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 If this were palpable we would not need the guidewire but I think this obvious palpable today this would be the easiest way to assure that we get the correct area. I reviewed it with the patient she would like to proceed to get scheduled as soon as possible.

## 2011-08-09 ENCOUNTER — Other Ambulatory Visit (INDEPENDENT_AMBULATORY_CARE_PROVIDER_SITE_OTHER): Payer: Self-pay | Admitting: Surgery

## 2011-08-09 ENCOUNTER — Ambulatory Visit (HOSPITAL_COMMUNITY)
Admission: RE | Admit: 2011-08-09 | Discharge: 2011-08-09 | Disposition: A | Discharge status: Home / Self Care | Payer: Medicare Other | Source: Ambulatory Visit | Attending: Surgery | Admitting: Surgery

## 2011-08-09 ENCOUNTER — Encounter (HOSPITAL_COMMUNITY)
Admission: RE | Admit: 2011-08-09 | Discharge: 2011-08-09 | Disposition: A | Discharge status: Home / Self Care | Payer: Medicare Other | Source: Ambulatory Visit | Attending: Surgery | Admitting: Surgery

## 2011-08-09 ENCOUNTER — Encounter (INDEPENDENT_AMBULATORY_CARE_PROVIDER_SITE_OTHER): Payer: Self-pay | Admitting: Surgery

## 2011-08-09 DIAGNOSIS — N6320 Unspecified lump in the left breast, unspecified quadrant: Secondary | ICD-10-CM

## 2011-08-09 LAB — CBC
HCT: 40.3 % (ref 36.0–46.0)
MCV: 87.6 fL (ref 78.0–100.0)
RDW: 14.1 % (ref 11.5–15.5)
WBC: 10.8 10*3/uL — ABNORMAL HIGH (ref 4.0–10.5)

## 2011-08-09 LAB — BASIC METABOLIC PANEL
BUN: 17 mg/dL (ref 6–23)
Chloride: 103 mEq/L (ref 96–112)
Creatinine, Ser: 0.69 mg/dL (ref 0.50–1.10)
GFR calc Af Amer: >60 mL/min (ref >60)

## 2011-08-09 LAB — DIFFERENTIAL
Eosinophils Relative: 2 % (ref 0–5)
Lymphocytes Relative: 31 % (ref 12–46)
Lymphs Abs: 3.4 10*3/uL (ref 0.7–4.0)
Monocytes Absolute: 0.7 10*3/uL (ref 0.1–1.0)
Monocytes Relative: 6 % (ref 3–12)

## 2011-08-10 ENCOUNTER — Other Ambulatory Visit (INDEPENDENT_AMBULATORY_CARE_PROVIDER_SITE_OTHER): Payer: Self-pay | Admitting: Surgery

## 2011-08-10 ENCOUNTER — Ambulatory Visit (HOSPITAL_COMMUNITY)
Admission: RE | Admit: 2011-08-10 | Discharge: 2011-08-10 | Disposition: A | Discharge status: Home / Self Care | Payer: Medicare Other | Source: Ambulatory Visit | Attending: Surgery | Admitting: Surgery

## 2011-08-10 ENCOUNTER — Ambulatory Visit
Admission: RE | Admit: 2011-08-10 | Discharge: 2011-08-10 | Disposition: A | Discharge status: Home / Self Care | Payer: Medicare Other | Source: Ambulatory Visit | Attending: Surgery | Admitting: Surgery

## 2011-08-10 ENCOUNTER — Encounter (INDEPENDENT_AMBULATORY_CARE_PROVIDER_SITE_OTHER): Payer: Self-pay | Admitting: Surgery

## 2011-08-10 DIAGNOSIS — Z01812 Encounter for preprocedural laboratory examination: Secondary | ICD-10-CM | POA: Insufficient documentation

## 2011-08-10 DIAGNOSIS — D249 Benign neoplasm of unspecified breast: Secondary | ICD-10-CM

## 2011-08-10 DIAGNOSIS — N63 Unspecified lump in unspecified breast: Secondary | ICD-10-CM

## 2011-08-10 DIAGNOSIS — Z01818 Encounter for other preprocedural examination: Secondary | ICD-10-CM | POA: Insufficient documentation

## 2011-08-10 DIAGNOSIS — N6019 Diffuse cystic mastopathy of unspecified breast: Secondary | ICD-10-CM

## 2011-08-10 HISTORY — PX: BREAST EXCISIONAL BIOPSY: SUR124

## 2011-08-12 NOTE — Op Note (Signed)
  [name], [name] NO.:  [id number]  MEDICAL RECORD NO.:  [id number]  LOCATION:  SDS                          FACILITY:  [facility]  PHYSICIAN:  Currie Paris, M.D.DATE OF BIRTH:  Dec 18, 1929  DATE OF PROCEDURE:  08/10/2011 DATE OF DISCHARGE:                              OPERATIVE REPORT   PREOPERATIVE DIAGNOSIS:  Left breast mass, upper outer quadrant, probable papilloma.  POSTOPERATIVE DIAGNOSIS:  Left breast mass, upper outer quadrant, probable papilloma.  PROCEDURE:  Needle-guided left lumpectomy.  SURGEON:  Currie Paris, MD.  ANESTHESIA:  General.  CLINICAL HISTORY:  This is a 77 year old lady, who recently had abnormality found and a biopsy done showing a papilloma.  It was decided to proceed to an excisional biopsy to rule out an underlying or associated area of carcinoma.  DESCRIPTION OF PROCEDURE:  The patient was seen in the holding area and had no further questions.  I marked the left breast as the operative side.  I reviewed the localizing films and the fact that there was a mark on the skin overlying the area of the mass, which was then about the 1:30 position at the areolar margin on the left.  The patient was taken to the operating room, and after satisfactory general endotracheal anesthesia had been obtained, the breast was prepped and draped, and the time-out was done.  The area around the areolar was incised making an incision from about the 11 o'clock to about the 2 o'clock position at the areolar margin.  The guidewire was manipulated into the wound and I took a cylinder of tissue around the guidewire starting medially and working laterally in the direction of the guidewire was going.  I got beyond the tip of the guidewire.  I could palpate a mass within it and specimen mammogram showed the clip in the middle.  The area was then checked for hemostasis and that was achieved with combination of cautery and suture  ligatures.  The breast was then closed with some 3-0 Vicryl, 4-0 Monocryl subcuticular, and Dermabond on the skin.  The patient tolerated the procedure well and there were no complications.  All counts were correct.    Currie Paris, M.D.    CJS/MEDQ  D:  08/10/2011  T:  08/10/2011  Job:  [id number]  Electronically Signed by Cyndia Bent M.D. on 08/12/2011 06:17:32 AM

## 2011-08-15 ENCOUNTER — Telehealth (INDEPENDENT_AMBULATORY_CARE_PROVIDER_SITE_OTHER): Payer: Self-pay | Admitting: General Surgery

## 2011-08-15 NOTE — Telephone Encounter (Signed)
Message copied by Liliana Cline on Mon Aug 15, 2011  9:13 AM ------      Message from: Currie Paris      Created: Sun Aug 14, 2011  8:54 PM       Tell her path is benign and as expected

## 2011-08-15 NOTE — Telephone Encounter (Signed)
Patient aware path is benign. She will keep her follow up appt on the 18th, and call with any problems prior.

## 2011-08-21 ENCOUNTER — Encounter (INDEPENDENT_AMBULATORY_CARE_PROVIDER_SITE_OTHER): Payer: Self-pay | Admitting: Surgery

## 2011-08-23 ENCOUNTER — Encounter (INDEPENDENT_AMBULATORY_CARE_PROVIDER_SITE_OTHER): Payer: Self-pay | Admitting: Surgery

## 2011-08-23 ENCOUNTER — Ambulatory Visit (INDEPENDENT_AMBULATORY_CARE_PROVIDER_SITE_OTHER): Payer: Medicare Other | Admitting: Surgery

## 2011-08-23 VITALS — BP 128/80 | HR 68 | Temp 98.0°F | Resp 20 | Ht 70.0 in | Wt 198.4 lb

## 2011-08-23 DIAGNOSIS — Z9889 Other specified postprocedural states: Secondary | ICD-10-CM

## 2011-08-23 NOTE — Progress Notes (Signed)
NAME: [name]                                            DOB: [birth year] DATE: 08/23/2011                                                  MRN: [id number]  CC: P/O VISIT  HPI: This patient comes in for post op follow-up.Sheunderwent removal of an intraductal papilloma from the left breat on 08/10/11. She feels that she is doing well.She does note to the breast feels heavy and like she has "cardboard "in it. She is also noted some bruising. It has not been particularly painful just a little bit uncomfortable.  PE: General: The patient appears to be healthy, NAD Breasts: The incision is healing nicely. There is a moderate ecchymosis around it. I think there is some hematoma there suggesting that she had some postoperative bleeding. It is not large enough toward draining and I don't think will cause any long-term problems and should reabsorb over the next month or two  DATA REVIEWED: Pathology report showed intraductal papilloma  IMPRESSION: The patient is doing well S/P Removal of papilloma although she does have a hematoma.Marland Kitchen    PLAN: She can resume all normal activities. I told her that I would like to check her in about two months.

## 2011-08-23 NOTE — Patient Instructions (Signed)
Call me if you have any other problems with your incision or any drainage. I suspect the bruising and fullness that you feel will take two or three months to go away.  Your pathology report showed benign tissue, a small growth in a milk duct that was not cancer. You need to continue your routine mammograms with your primary physicians.

## 2011-08-25 LAB — CBC
Hemoglobin: 10 — ABNORMAL LOW
MCHC: 33.9
MCHC: 34.5
MCHC: 34.6
MCHC: 34.7
MCHC: 34.8
MCHC: 34.9
MCV: 86.8
MCV: 87
MCV: 87.3
MCV: 89.1
Platelets: 153
Platelets: 198
Platelets: 211
Platelets: 247
Platelets: 260
RBC: 3.1 — ABNORMAL LOW
RBC: 3.15 — ABNORMAL LOW
RDW: 14.1
RDW: 14.2
RDW: 14.6
RDW: 14.9
WBC: 12.5 — ABNORMAL HIGH
WBC: 8
WBC: 9.9

## 2011-08-25 LAB — URINE MICROSCOPIC-ADD ON

## 2011-08-25 LAB — BASIC METABOLIC PANEL
BUN: 10
BUN: 11
BUN: 14
BUN: 15
BUN: 6
BUN: 9
CO2: 29
CO2: 29
CO2: 31
Calcium: 8 — ABNORMAL LOW
Calcium: 8.6
Calcium: 9.1
Calcium: 9.2
Chloride: 102
Chloride: 97
Creatinine, Ser: 0.51
Creatinine, Ser: 0.55
Creatinine, Ser: 0.59
Creatinine, Ser: 0.6
Creatinine, Ser: 0.63
Creatinine, Ser: 0.66
GFR calc Af Amer: >60
GFR calc Af Amer: >60
GFR calc non Af Amer: >60
GFR calc non Af Amer: >60
GFR calc non Af Amer: >60
Glucose, Bld: 130 — ABNORMAL HIGH
Glucose, Bld: 134 — ABNORMAL HIGH
Glucose, Bld: 156 — ABNORMAL HIGH
Potassium: 3.6
Sodium: 136

## 2011-08-25 LAB — DIFFERENTIAL
Basophils Absolute: 0
Basophils Relative: 0
Eosinophils Absolute: 0.3
Eosinophils Relative: 2
Monocytes Absolute: 0.9
Neutro Abs: 9.4 — ABNORMAL HIGH

## 2011-08-25 LAB — TYPE AND SCREEN: PT AG Type: NEGATIVE

## 2011-08-25 LAB — COMPREHENSIVE METABOLIC PANEL
AST: 23
Albumin: 4
Alkaline Phosphatase: 81
Chloride: 105
GFR calc Af Amer: >60
Potassium: 3.5
Total Bilirubin: 1

## 2011-08-25 LAB — PREPARE RBC (CROSSMATCH)

## 2011-08-25 LAB — PROTIME-INR
INR: 1.4
INR: 2.2 — ABNORMAL HIGH
Prothrombin Time: 14.3
Prothrombin Time: 20.7 — ABNORMAL HIGH
Prothrombin Time: 21.5 — ABNORMAL HIGH

## 2011-08-25 LAB — URINALYSIS, ROUTINE W REFLEX MICROSCOPIC
Glucose, UA: NEGATIVE
Hgb urine dipstick: NEGATIVE
Protein, ur: NEGATIVE
Specific Gravity, Urine: 1.01

## 2011-08-25 LAB — APTT: aPTT: 27

## 2011-09-08 ENCOUNTER — Telehealth (INDEPENDENT_AMBULATORY_CARE_PROVIDER_SITE_OTHER): Payer: Self-pay | Admitting: General Surgery

## 2011-09-08 DIAGNOSIS — S300XXA Contusion of lower back and pelvis, initial encounter: Secondary | ICD-10-CM

## 2011-09-08 NOTE — Telephone Encounter (Signed)
Patient states pain is worse in her buttock where she developed a hematoma after a fall. Per Dr Jamey Ripa to order an ultrasound to see if area can be aspirated. I made patient aware our referral coordinator will call her with appt.

## 2011-09-13 ENCOUNTER — Ambulatory Visit
Admission: RE | Admit: 2011-09-13 | Discharge: 2011-09-13 | Disposition: A | Discharge status: Home / Self Care | Payer: Medicare Other | Source: Ambulatory Visit | Attending: Surgery | Admitting: Surgery

## 2011-09-13 ENCOUNTER — Ambulatory Visit (INDEPENDENT_AMBULATORY_CARE_PROVIDER_SITE_OTHER): Payer: Medicare Other | Admitting: Surgery

## 2011-09-13 ENCOUNTER — Encounter (INDEPENDENT_AMBULATORY_CARE_PROVIDER_SITE_OTHER): Payer: Self-pay | Admitting: Surgery

## 2011-09-13 ENCOUNTER — Telehealth (INDEPENDENT_AMBULATORY_CARE_PROVIDER_SITE_OTHER): Payer: Self-pay | Admitting: General Surgery

## 2011-09-13 VITALS — BP 160/101 | HR 64 | Temp 97.8°F | Resp 13 | Ht 70.0 in | Wt 195.6 lb

## 2011-09-13 DIAGNOSIS — S300XXA Contusion of lower back and pelvis, initial encounter: Secondary | ICD-10-CM

## 2011-09-13 DIAGNOSIS — D249 Benign neoplasm of unspecified breast: Secondary | ICD-10-CM

## 2011-09-13 NOTE — Telephone Encounter (Signed)
[facility] called this morning stating that the order of the hematoma of the buttock was wrong for this patient. I looked further and realized I had gotten patient confused with another patient who had a hematoma on her buttock and was told to call us if it did not get better. I spoke with Dr Jamey Ripa and made him aware what happened. He advised patient did not need an ultrasound but needed an appt in the office to see him to recheck that area if she did not think it was better. I left a message on the patient's machine to call me back and apologized for the confusion. I am awaiting a call.

## 2011-09-13 NOTE — Progress Notes (Signed)
NAME: [name]                                            DOB: [birth year]  DATE: 09/13/2011                                                  MRN: [id number]  The patient called today to be worked into my office.  She has a post-operative hematoma after a lumpectomy on 08/10/11.  The lumpectomy site is well-healed, but does have a firm hematoma in the biopsy cavity.  No deformity to the breast contour.  No skin bruising.  I anesthetized the skin with 1% Lidocaine and attempted to aspirate the area.  We aspirated only about 2 cc of old dark-appearing blood.  I reassured the patient that the hematoma would resolve over time.  Follow-up with Dr. Jamey Ripa PRN     Old Note from 9/18 CC: P/O VISIT  HPI: This patient comes in for post op follow-up.Sheunderwent removal of an intraductal papilloma from the left breat on 08/10/11. She feels that she is doing well.She does note to the breast feels heavy and like she has "cardboard "in it. She is also noted some bruising. It has not been particularly painful just a little bit uncomfortable.  PE: General: The patient appears to be healthy, NAD Breasts: The incision is healing nicely. There is a moderate ecchymosis around it. I think there is some hematoma there suggesting that she had some postoperative bleeding. It is not large enough toward draining and I don't think will cause any long-term problems and should reabsorb over the next month or two  DATA REVIEWED: Pathology report showed intraductal papilloma  IMPRESSION: The patient is doing well S/P Removal of papilloma although she does have a hematoma.Marland Kitchen    PLAN: She can resume all normal activities. I told her that I would like to check her in about two months.

## 2011-09-13 NOTE — Patient Instructions (Signed)
Remove dressing tomorrow.  You may massage this area and place warm compresses over this area.  Follow-up with Dr. Jamey Ripa as needed.

## 2011-10-03 ENCOUNTER — Telehealth: Payer: Self-pay | Admitting: Internal Medicine

## 2011-10-03 NOTE — Telephone Encounter (Signed)
Patient wants to switch care to Dr. Leone Payor. Is this okay with you Dr. [name]?

## 2011-10-03 NOTE — Telephone Encounter (Signed)
Left a message for patient to call me. 

## 2011-10-03 NOTE — Telephone Encounter (Signed)
OK to switch 

## 2011-10-04 NOTE — Telephone Encounter (Signed)
Spoke with patient and informed her the switch has been approved. She wants to scheduled an appointment with Dr. Leone Payor for right upper abd pain. Scheduled patient on 10/13/11 at 3:15 PM.

## 2011-10-04 NOTE — Telephone Encounter (Signed)
Left a message for patient to call me. 

## 2011-10-04 NOTE — Telephone Encounter (Signed)
Patient wants to switch from Dr. [name] to Dr. Leone Payor. Dr. [name] has agreed to switch. Is it ok with you Dr. Leone Payor?

## 2011-10-04 NOTE — Telephone Encounter (Signed)
I accept 

## 2011-10-06 ENCOUNTER — Encounter: Payer: Self-pay | Admitting: Cardiovascular Disease

## 2011-10-13 ENCOUNTER — Encounter: Payer: Self-pay | Admitting: Internal Medicine

## 2011-10-13 ENCOUNTER — Ambulatory Visit (INDEPENDENT_AMBULATORY_CARE_PROVIDER_SITE_OTHER): Payer: Medicare Other | Admitting: Internal Medicine

## 2011-10-13 VITALS — BP 140/80 | HR 76 | Ht 70.0 in | Wt 205.4 lb

## 2011-10-13 DIAGNOSIS — K219 Gastro-esophageal reflux disease without esophagitis: Secondary | ICD-10-CM

## 2011-10-13 MED ORDER — ESOMEPRAZOLE MAGNESIUM 40 MG PO CPDR: 40.0000 mg | DELAYED_RELEASE_CAPSULE | Freq: Every day | ORAL | Status: DC

## 2011-10-13 NOTE — Progress Notes (Signed)
  Subjective:    Patient ID: [name], female    DOB: [birth year], 77 y.o.   MRN: [id number]  HPI This is a pleasant 77 year old white woman with previous care by Dr. Carman Ching a number of years ago. That was a colonoscopy. Performed in 2004.  She is here today for complaints of epigastric burning that she relates to her acid reflux. She was on Nexium 40 mg daily but earlier this year it became cost prohibitive. She had been able to purchase it with a 50 to a co-pay for years but then it became $300. She does not think she has been in the donor and all that was early in 2012. At any rate she been using omeprazole 20 mg daily as prescribed by her primary care physician. That is been ineffective. If she takes 2 or 3 doses a day she will get relief. She does not have dysphagia or any signs of bleeding or unintentional weight loss. Her GI review of systems is otherwise negative. She denies excessive caffeine use.  Review of Systems In February of this year she suffered an acute ischemic colitis diagnosed by CT scan and was hospitalized. That is resolved.    Objective:   Physical Exam This is a well-developed elderly white woman in no acute distress Lungs clear Heart S1-S2 no murmurs or gallops The abdomen is soft and nontender without organomegaly or mass       Assessment & Plan:

## 2011-10-13 NOTE — Assessment & Plan Note (Addendum)
She had done well on Nexium 40 mg daily and then not so on omeprazole 20 mg a day. That is not surprising considering the dose decrease. Will retry Nexium 40 mg daily since that appears to be preferred and on her formulary at this time. If not affordable will go with a higher-dose generic.

## 2011-10-13 NOTE — Patient Instructions (Signed)
Your prescription(s) has(have) been sent to your pharmacy for you to pick up. Try the Nexium if it's too expensive give [place] a call back and we will try something else. Take 2 Omeprazole tablets until you get the Nexium.

## 2011-10-14 ENCOUNTER — Encounter: Payer: Self-pay | Admitting: Internal Medicine

## 2011-10-20 ENCOUNTER — Telehealth: Payer: Self-pay | Admitting: Gastroenterology

## 2011-10-20 NOTE — Telephone Encounter (Signed)
Received a prior authorization request note from [facility] stating patient needs prior authorization for Nexium. Called [phone] for medication prior authorization. Nexium was approved from 08/20/11 - 10/19/12. For future information patient stated that she has also tried and failed Pepcid AC, Zegerid, Tagamet, Zantac, Tums, Prilosec OTC, and Prevacid.

## 2011-10-27 ENCOUNTER — Emergency Department (HOSPITAL_COMMUNITY): Payer: Medicare Other

## 2011-10-27 ENCOUNTER — Encounter (HOSPITAL_COMMUNITY): Payer: Self-pay | Admitting: *Deleted

## 2011-10-27 ENCOUNTER — Emergency Department (HOSPITAL_COMMUNITY)
Admission: EM | Admit: 2011-10-27 | Discharge: 2011-10-27 | Disposition: A | Discharge status: Home / Self Care | Payer: Medicare Other | Attending: Emergency Medicine | Admitting: Emergency Medicine

## 2011-10-27 DIAGNOSIS — K219 Gastro-esophageal reflux disease without esophagitis: Secondary | ICD-10-CM | POA: Insufficient documentation

## 2011-10-27 DIAGNOSIS — R0989 Other specified symptoms and signs involving the circulatory and respiratory systems: Secondary | ICD-10-CM | POA: Insufficient documentation

## 2011-10-27 DIAGNOSIS — R0602 Shortness of breath: Secondary | ICD-10-CM | POA: Insufficient documentation

## 2011-10-27 DIAGNOSIS — K529 Noninfective gastroenteritis and colitis, unspecified: Secondary | ICD-10-CM

## 2011-10-27 DIAGNOSIS — E785 Hyperlipidemia, unspecified: Secondary | ICD-10-CM | POA: Insufficient documentation

## 2011-10-27 DIAGNOSIS — R0609 Other forms of dyspnea: Secondary | ICD-10-CM | POA: Insufficient documentation

## 2011-10-27 DIAGNOSIS — R42 Dizziness and giddiness: Secondary | ICD-10-CM | POA: Insufficient documentation

## 2011-10-27 DIAGNOSIS — M1990 Unspecified osteoarthritis, unspecified site: Secondary | ICD-10-CM | POA: Insufficient documentation

## 2011-10-27 DIAGNOSIS — I10 Essential (primary) hypertension: Secondary | ICD-10-CM | POA: Insufficient documentation

## 2011-10-27 DIAGNOSIS — K2990 Gastroduodenitis, unspecified, without bleeding: Secondary | ICD-10-CM | POA: Insufficient documentation

## 2011-10-27 DIAGNOSIS — F3289 Other specified depressive episodes: Secondary | ICD-10-CM | POA: Insufficient documentation

## 2011-10-27 DIAGNOSIS — R079 Chest pain, unspecified: Secondary | ICD-10-CM | POA: Insufficient documentation

## 2011-10-27 DIAGNOSIS — R1011 Right upper quadrant pain: Secondary | ICD-10-CM | POA: Insufficient documentation

## 2011-10-27 DIAGNOSIS — K5289 Other specified noninfective gastroenteritis and colitis: Secondary | ICD-10-CM | POA: Insufficient documentation

## 2011-10-27 DIAGNOSIS — Z7982 Long term (current) use of aspirin: Secondary | ICD-10-CM | POA: Insufficient documentation

## 2011-10-27 DIAGNOSIS — M129 Arthropathy, unspecified: Secondary | ICD-10-CM | POA: Insufficient documentation

## 2011-10-27 DIAGNOSIS — F329 Major depressive disorder, single episode, unspecified: Secondary | ICD-10-CM | POA: Insufficient documentation

## 2011-10-27 DIAGNOSIS — R112 Nausea with vomiting, unspecified: Secondary | ICD-10-CM | POA: Insufficient documentation

## 2011-10-27 DIAGNOSIS — Z79899 Other long term (current) drug therapy: Secondary | ICD-10-CM | POA: Insufficient documentation

## 2011-10-27 DIAGNOSIS — K2970 Gastritis, unspecified, without bleeding: Secondary | ICD-10-CM | POA: Insufficient documentation

## 2011-10-27 DIAGNOSIS — R197 Diarrhea, unspecified: Secondary | ICD-10-CM | POA: Insufficient documentation

## 2011-10-27 LAB — TROPONIN I: Troponin I: <0.3 ng/mL (ref <0.30)

## 2011-10-27 LAB — COMPREHENSIVE METABOLIC PANEL
ALT: 15 U/L (ref 0–35)
AST: 22 U/L (ref 0–37)
Alkaline Phosphatase: 100 U/L (ref 39–117)
Calcium: 10.1 mg/dL (ref 8.4–10.5)
GFR calc Af Amer: 78 mL/min — ABNORMAL LOW (ref >90)
Glucose, Bld: 118 mg/dL — ABNORMAL HIGH (ref 70–99)
Potassium: 4.1 mEq/L (ref 3.5–5.1)
Sodium: 141 mEq/L (ref 135–145)
Total Protein: 6.8 g/dL (ref 6.0–8.3)

## 2011-10-27 LAB — CBC
HCT: 41.4 % (ref 36.0–46.0)
Hemoglobin: 14 g/dL (ref 12.0–15.0)
MCH: 30.1 pg (ref 26.0–34.0)
MCV: 89 fL (ref 78.0–100.0)
Platelets: 195 10*3/uL (ref 150–400)
RBC: 4.65 MIL/uL (ref 3.87–5.11)
WBC: 20 10*3/uL — ABNORMAL HIGH (ref 4.0–10.5)

## 2011-10-27 LAB — DIFFERENTIAL
Eosinophils Absolute: 0.1 10*3/uL (ref 0.0–0.7)
Eosinophils Relative: 1 % (ref 0–5)
Lymphocytes Relative: 9 % — ABNORMAL LOW (ref 12–46)
Lymphs Abs: 1.7 10*3/uL (ref 0.7–4.0)
Monocytes Absolute: 1.1 10*3/uL — ABNORMAL HIGH (ref 0.1–1.0)
Monocytes Relative: 6 % (ref 3–12)

## 2011-10-27 LAB — URINALYSIS, ROUTINE W REFLEX MICROSCOPIC
Bilirubin Urine: NEGATIVE
Glucose, UA: NEGATIVE mg/dL
Hgb urine dipstick: NEGATIVE
Specific Gravity, Urine: 1.01 (ref 1.005–1.030)
Urobilinogen, UA: 1 mg/dL (ref 0.0–1.0)
pH: 6.5 (ref 5.0–8.0)

## 2011-10-27 LAB — URINE MICROSCOPIC-ADD ON

## 2011-10-27 LAB — POCT I-STAT TROPONIN I: Troponin i, poc: 0.01 ng/mL (ref 0.00–0.08)

## 2011-10-27 MED ORDER — CIPROFLOXACIN HCL 500 MG PO TABS: 500.0000 mg | ORAL_TABLET | Freq: Two times a day (BID) | ORAL | Status: AC

## 2011-10-27 MED ORDER — METRONIDAZOLE 500 MG PO TABS: 500.0000 mg | ORAL_TABLET | Freq: Two times a day (BID) | ORAL | Status: AC

## 2011-10-27 MED ORDER — MORPHINE SULFATE 4 MG/ML IJ SOLN
4.0000 mg | Freq: Once | INTRAMUSCULAR | Status: AC
  Administered 2011-10-27: 4 mg via INTRAVENOUS
  Filled 2011-10-27: qty 1

## 2011-10-27 MED ORDER — SODIUM CHLORIDE 0.9 % IV SOLN
999.0000 mL | INTRAVENOUS | Status: DC
  Administered 2011-10-27 (×2): 999 mL via INTRAVENOUS
  Administered 2011-10-27: 1000 mL via INTRAVENOUS

## 2011-10-27 MED ORDER — SUCRALFATE 1 G PO TABS: 1.0000 g | ORAL_TABLET | Freq: Four times a day (QID) | ORAL | Status: DC

## 2011-10-27 MED ORDER — IOHEXOL 300 MG/ML  SOLN
80.0000 mL | Freq: Once | INTRAMUSCULAR | Status: AC | PRN
  Administered 2011-10-27: 80 mL via INTRAVENOUS

## 2011-10-27 MED ORDER — GI COCKTAIL ~~LOC~~
30.0000 mL | Freq: Once | ORAL | Status: AC
  Administered 2011-10-27: 30 mL via ORAL
  Filled 2011-10-27: qty 30

## 2011-10-27 MED ORDER — ONDANSETRON HCL 4 MG PO TABS: 4.0000 mg | ORAL_TABLET | Freq: Four times a day (QID) | ORAL | Status: AC

## 2011-10-27 MED ORDER — ONDANSETRON HCL 4 MG/2ML IJ SOLN
INTRAMUSCULAR | Status: AC
  Administered 2011-10-27: 15:00:00
  Filled 2011-10-27: qty 2

## 2011-10-27 NOTE — ED Provider Notes (Signed)
History     CSN: [id number] Arrival date & time: 10/27/2011  2:25 PM   First MD Initiated Contact with Patient 10/27/11 1445      Chief Complaint  Patient presents with  . Chest Pain  . Shortness of Breath  . Emesis   chief complaint abdominal pain  (Consider location/radiation/quality/duration/timing/severity/associated sxs/prior treatment) HPI Complains of right upper quadrant pain, nonradiating onset 2 hours ago while eating accompanied by one episode of vomiting one episode of diarrhea in  lightheadedness.. No chest pain. Patient presently complains of mild dyspnea. No fever no cough no other associated symptoms. pain is much improved after treatment with Zofran is mild at present. Was severe at onset no other complaint. Past Medical History  Diagnosis Date  . Arthritis   . Fibromyalgia   . Skin cancer   . Intraductal papilloma of breast, Left 07/07/2011  . SUI (stress urinary incontinence, female)   . GERD (gastroesophageal reflux disease)   . HLD (hyperlipidemia)   . Osteoarthritis   . Depression   . Peripheral neuropathy   . HTN (hypertension)   . Ulcerative colitis   . Gastroenteritis   . Anemia   . Internal hemorrhoids   . Acute ischemic colitis 01/2011  . Reflux     Past Surgical History  Procedure Date  . Joint replacement 2010 and 2012    both  . Breast excisional biopsy 08/10/2011    Left - Dr Jamey Ripa  . Breast surgery   . Carpal tunnel release     left  . Cesarean section   . Cholecystectomy   . Abdominal hysterectomy   . Colonoscopy 04/29/2003    internal hemorrhoids  . Appendectomy     Family History  Problem Relation Age of Onset  . Colon cancer Mother 74  . Diabetes Brother     History  Substance Use Topics  . Smoking status: Never Smoker   . Smokeless tobacco: Never Used  . Alcohol Use: No    OB History    Grav Para Term Preterm Abortions TAB SAB Ect Mult Living                  Review of Systems  Constitutional: Negative.     HENT: Negative.   Respiratory: Positive for shortness of breath.   Cardiovascular: Negative.   Gastrointestinal: Positive for nausea, vomiting, abdominal pain and diarrhea.  Musculoskeletal: Negative.   Skin: Negative.   Neurological: Negative.   Hematological: Negative.   Psychiatric/Behavioral: Negative.   All other systems reviewed and are negative.    Allergies  Review of patient's allergies indicates no known allergies.  Home Medications   Current Outpatient Rx  Name Route Sig Dispense Refill  . ASPIRIN 81 MG PO TABS Oral Take 81 mg by mouth daily.      Marland Kitchen DOCUSATE SODIUM 250 MG PO CAPS Oral Take 250 mg by mouth daily.      Marland Kitchen ESOMEPRAZOLE MAGNESIUM 40 MG PO CPDR Oral Take 1 capsule (40 mg total) by mouth daily before breakfast. 30 capsule 11  . ESTRADIOL 0.0375 MG/24HR TD PTTW Transdermal Place 1 patch onto the skin 2 (two) times a week.      Marland Kitchen FOLIC ACID 400 MCG PO TABS Oral Take 400 mcg by mouth daily.      Marland Kitchen HYDROCHLOROTHIAZIDE 25 MG PO TABS Oral Take 25 mg by mouth daily.      Marland Kitchen HYDROCODONE-ACETAMINOPHEN 5-500 MG PO TABS  Ad lib.    Marland Kitchen MULTIVITAMIN PO Oral  Take by mouth.      . MUPIROCIN 2 % EX OINT  daily.    Marland Kitchen PREGABALIN 50 MG PO CAPS Oral Take 50 mg by mouth 3 (three) times daily.      Marland Kitchen ROPINIROLE HCL 0.25 MG PO TABS Oral Take 0.25 mg by mouth 3 (three) times daily.      . SERTRALINE HCL 50 MG PO TABS Oral Take 50 mg by mouth daily.      Marland Kitchen SIMVASTATIN 20 MG PO TABS  daily.    . TRAMADOL HCL 50 MG PO TABS Oral Take 50 mg by mouth every 6 (six) hours as needed.      . TRAZODONE HCL 100 MG PO TABS Oral Take 100 mg by mouth at bedtime.      Marland Kitchen ZINC 15 PO Oral Take by mouth daily.        BP 128/59  Pulse 87  Temp 97.2 F (36.2 C)  Resp 14  SpO2 96%  Physical Exam  Nursing note and vitals reviewed. Constitutional: She appears well-developed and well-nourished.  HENT:  Head: Normocephalic and atraumatic.  Eyes: Conjunctivae are normal. Pupils are equal, round,  and reactive to light.  Neck: Neck supple. No tracheal deviation present. No thyromegaly present.  Cardiovascular: Normal rate and regular rhythm.   No murmur heard. Pulmonary/Chest: Effort normal and breath sounds normal.  Abdominal: Soft. Bowel sounds are normal. She exhibits no distension and no mass. There is tenderness. There is no rebound and no guarding.       Mild tenderness at right upper quadrant  Musculoskeletal: Normal range of motion. She exhibits no edema and no tenderness.  Neurological: She is alert. Coordination normal.  Skin: Skin is warm and dry. No rash noted.  Psychiatric: She has a normal mood and affect.    ED Course  Procedures (including critical care time)  Labs Reviewed - No data to display No results found.   No diagnosis found.  Date: 10/27/2011  Rate: 80  Rhythm: normal sinus rhythm  QRS Axis: left  Intervals: normal  ST/T Wave abnormalities: nonspecific T wave changes  Conduction Disutrbances:none  Narrative Interpretation:   Old EKG Reviewed: unchanged Unchanged from Feb. 13 2012   Patient signed out to Dr.Pickering 330 pm MDM  Diagnosis abdominal pain        Doug Sou, MD 10/27/11 1536

## 2011-10-27 NOTE — ED Provider Notes (Signed)
Medical screening examination/treatment/procedure(s) were conducted as a shared visit with non-physician practitioner(s) and myself.  I personally evaluated the patient during the encounter  [name]. Rubin Payor, MD 10/27/11 2356

## 2011-10-27 NOTE — ED Notes (Signed)
The pt is alert blood being drawn pt taken to xray

## 2011-10-27 NOTE — ED Notes (Signed)
Family at the bedside.  Pt up to br for urine collection.  Pt had a bm.  Minor pain at present.  Med given.  Alert oriented skin warm and dry

## 2011-10-27 NOTE — ED Notes (Signed)
Family at bedside. 

## 2011-10-27 NOTE — ED Provider Notes (Signed)
Physical Exam  BP 148/82  Pulse 91  Temp 97.2 F (36.2 C)  Resp 18  SpO2 96%  Physical Exam  ED Course  Procedures Results for orders placed during the hospital encounter of 10/27/11  COMPREHENSIVE METABOLIC PANEL      Component Value Range   Sodium 141  135 - 145 (mEq/L)   Potassium 4.1  3.5 - 5.1 (mEq/L)   Chloride 105  96 - 112 (mEq/L)   CO2 26  19 - 32 (mEq/L)   Glucose, Bld 118 (*) 70 - 99 (mg/dL)   BUN 21  6 - 23 (mg/dL)   Creatinine, Ser 1.61  0.50 - 1.10 (mg/dL)   Calcium 09.6  8.4 - 10.5 (mg/dL)   Total Protein 6.8  6.0 - 8.3 (g/dL)   Albumin 3.8  3.5 - 5.2 (g/dL)   AST 22  0 - 37 (U/L)   ALT 15  0 - 35 (U/L)   Alkaline Phosphatase 100  39 - 117 (U/L)   Total Bilirubin 0.4  0.3 - 1.2 (mg/dL)   GFR calc non Af Amer 67 (*) >90 (mL/min)   GFR calc Af Amer 78 (*) >90 (mL/min)  LIPASE, BLOOD      Component Value Range   Lipase 28  11 - 59 (U/L)  URINALYSIS, ROUTINE W REFLEX MICROSCOPIC      Component Value Range   Color, Urine YELLOW  YELLOW    Appearance CLEAR  CLEAR    Specific Gravity, Urine 1.010  1.005 - 1.030    pH 6.5  5.0 - 8.0    Glucose, UA NEGATIVE  NEGATIVE (mg/dL)   Hgb urine dipstick NEGATIVE  NEGATIVE    Bilirubin Urine NEGATIVE  NEGATIVE    Ketones, ur NEGATIVE  NEGATIVE (mg/dL)   Protein, ur 30 (*) NEGATIVE (mg/dL)   Urobilinogen, UA 1.0  0.0 - 1.0 (mg/dL)   Nitrite NEGATIVE  NEGATIVE    Leukocytes, UA SMALL (*) NEGATIVE   CBC      Component Value Range   WBC 20.0 (*) 4.0 - 10.5 (K/uL)   RBC 4.65  3.87 - 5.11 (MIL/uL)   Hemoglobin 14.0  12.0 - 15.0 (g/dL)   HCT 04.5  40.9 - 81.1 (%)   MCV 89.0  78.0 - 100.0 (fL)   MCH 30.1  26.0 - 34.0 (pg)   MCHC 33.8  30.0 - 36.0 (g/dL)   RDW 91.4  78.2 - 95.6 (%)   Platelets 195  150 - 400 (K/uL)  DIFFERENTIAL      Component Value Range   Neutrophils Relative 85 (*) 43 - 77 (%)   Neutro Abs 17.1 (*) 1.7 - 7.7 (K/uL)   Lymphocytes Relative 9 (*) 12 - 46 (%)   Lymphs Abs 1.7  0.7 - 4.0 (K/uL)   Monocytes Relative 6  3 - 12 (%)   Monocytes Absolute 1.1 (*) 0.1 - 1.0 (K/uL)   Eosinophils Relative 1  0 - 5 (%)   Eosinophils Absolute 0.1  0.0 - 0.7 (K/uL)   Basophils Relative 0  0 - 1 (%)   Basophils Absolute 0.0  0.0 - 0.1 (K/uL)  URINE MICROSCOPIC-ADD ON      Component Value Range   Squamous Epithelial / LPF FEW (*) RARE    WBC, UA 0-2  <3 (WBC/hpf)   Bacteria, UA RARE  RARE    Casts HYALINE CASTS (*) NEGATIVE    Dg Abd Acute W/chest  10/27/2011  *RADIOLOGY REPORT*  Clinical Data: Abdominal pain.  ACUTE ABDOMEN SERIES (ABDOMEN 2 VIEW & CHEST 1 VIEW)  Comparison: CT abdomen and pelvis and chest and two views abdomen 01/08/2011.  Findings: Single view of the chest demonstrates clear lungs and normal heart size.  No pneumothorax or pleural effusion.  Two views of the abdomen show no free intraperitoneal air. There is no evidence of bowel obstruction.  Large volume of stool in the descending colon through the rectum noted. Degenerative disease about the hips is seen.  IMPRESSION: No acute finding.  Large volume of stool descending colon through the rectum noted.  Original Report Authenticated By: Bernadene Bell. D'ALESSIO, M.D.    MDM Patient had epigastric pain that radiated to her chest. EKG is reassuring. Recently diagnosed with reflux by Dr. Elenore Paddy. Labs are reassuring except for a white count of 20,000. She states that she feels a lot better but is still tender in her epigastric right upper quadrant. She will get a CT and 2 sets of cardiac markers.      Juliet Rude. Rubin Payor, MD 10/27/11 2356

## 2011-10-27 NOTE — ED Notes (Signed)
Pt was out on the deck and developed mid-lower sternal pressure and became dizzy, short of breath and diaphoretic and thought she was going to die.  Pt has pain with deep inspiration and palpation.  Recent diagnosis of reflux by Dr. Leone Payor and has had chole in the past.  Pt did vomit once and has only had zofran 4mg  by ems.  Pt also battles constipation, but has no cardiac history

## 2011-10-27 NOTE — ED Provider Notes (Signed)
  Physical Exam  BP 148/82  Pulse 91  Temp 97.2 F (36.2 C)  Resp 18  SpO2 96%  Physical Exam  ED Course  Procedures  MDM Pt has been moved to the CDU for holding.  First troponin neg.  CT abd/pelvis pending.  Pain right epigastric area currently 2/10 but seems to be aggravated by deep inspiration.  Denies nausea.  Just returned from bathroom and feels mildly SOB.  Will reassess shortly.  CT shows inflammation of descending colon through to the rectum.  Wall thickening not as pronounced as when pt diagnosed w/ colitis in 01/2011.  Results discussed w/ pt.  On re-examination, diffuse abd ttp; worst in epigastrium.  Pt takes omeprazole for gastritis.  Will try a GI cocktail.  Pain may d/t gastritis and/or colitis.    2nd troponin neg.  Pt had relief w/ GI cocktail.  Will prescribe carafate and recommend continuing her omeprazole.  D/t length of inflamed segment of bowel and leukocytosis, will treat pt w/ cipro and flagyl and advise her to f/u with Dr. Leone Payor, her GI physician.       [name], [place] 10/27/11 2044

## 2011-11-01 ENCOUNTER — Encounter (INDEPENDENT_AMBULATORY_CARE_PROVIDER_SITE_OTHER): Payer: Self-pay | Admitting: Surgery

## 2011-11-01 ENCOUNTER — Ambulatory Visit (INDEPENDENT_AMBULATORY_CARE_PROVIDER_SITE_OTHER): Payer: Medicare Other | Admitting: Surgery

## 2011-11-01 VITALS — BP 138/72 | HR 80 | Temp 96.9°F | Resp 16 | Ht 70.0 in | Wt 202.4 lb

## 2011-11-01 DIAGNOSIS — Z9889 Other specified postprocedural states: Secondary | ICD-10-CM

## 2011-11-01 NOTE — Progress Notes (Signed)
NAME: [name]                                            DOB: 06-May-1930 DATE: 11/01/2011                                                  MRN: [id number]  CC: Post op   HPI: This patient comes in for post op follow-up.Sheunderwent NL lumpectomy on 08/10/11. She feels that she is doing well.She developed a hematoma post op but that seems to have mostly resolved  PE: General: The patient appears to be healthy, NAD Breast is normal to appearance, but still a little firm at the surgical site. No evidence of infection or other problem  DATA REVIEWED: No new  IMPRESSION: The patient is doing well S/P Excisional breast biopsy.    PLAN: RTC PRN

## 2011-11-04 ENCOUNTER — Telehealth: Payer: Self-pay | Admitting: Internal Medicine

## 2011-11-04 NOTE — Telephone Encounter (Signed)
Patient was seen in the ER on Thanksgiving for segmental colitis diagnosed on CT and gastritis.  Her symptoms in the ER were severe lower and upper abdominal pain and diarrhea.  Pain was so severe she had to be transported by EMS.  She was started on Cipro and Flagyl she completed this yesterday she was feeling much better until this afternoon.  She reports pain started back in her upper abdomen this afternoon.  She reports bilateral upper abdominal pain, constipation and some lower abdominal cramping.  She denies rectal bleeding, diarrhea, nausea, vomiting or fever.  She is asking for something to be called in for her symptoms. She is also on Nexium daily not omeprazole  Dr Leone Payor please advise  I called the patient back to clarify that she is on a PPI and which one she is taking and she reports she has had a BM and is now feeling much better.  She is still asking if she needs additional meds to treat her colitis.  Dr Leone Payor please advise.   Discussed with Dr Leone Payor.  No new medication orders.  Patient is advised to remain on a soft diet.  She is asked to return to the ER or contact on call MD  for worsening abdominal pain, rectal bleeding, fever or other GI symptoms.

## 2011-11-28 ENCOUNTER — Ambulatory Visit: Payer: Medicare Other | Admitting: Internal Medicine

## 2012-03-20 ENCOUNTER — Ambulatory Visit: Payer: Medicare Other | Admitting: Cardiology

## 2012-04-05 ENCOUNTER — Encounter: Payer: Self-pay | Admitting: Cardiovascular Disease

## 2012-04-05 ENCOUNTER — Ambulatory Visit (INDEPENDENT_AMBULATORY_CARE_PROVIDER_SITE_OTHER): Payer: Medicare Other | Admitting: Cardiovascular Disease

## 2012-04-05 DIAGNOSIS — E785 Hyperlipidemia, unspecified: Secondary | ICD-10-CM

## 2012-04-05 DIAGNOSIS — R0600 Dyspnea, unspecified: Secondary | ICD-10-CM | POA: Insufficient documentation

## 2012-04-05 DIAGNOSIS — R079 Chest pain, unspecified: Secondary | ICD-10-CM | POA: Insufficient documentation

## 2012-04-05 DIAGNOSIS — I10 Essential (primary) hypertension: Secondary | ICD-10-CM

## 2012-04-05 DIAGNOSIS — R0602 Shortness of breath: Secondary | ICD-10-CM

## 2012-04-05 DIAGNOSIS — R0989 Other specified symptoms and signs involving the circulatory and respiratory systems: Secondary | ICD-10-CM

## 2012-04-05 NOTE — Assessment & Plan Note (Signed)
Cholesterol is at goal.  Continue current dose of statin and diet Rx.  No myalgias or side effects.  F/U  LFT's in 6 months. No results found for this basename: LDLCALC             

## 2012-04-05 NOTE — Patient Instructions (Signed)
Your physician recommends that you schedule a follow-up appointment in: PENDING TEST RESULTS Your physician recommends that you continue on your current medications as directed. Please refer to the Current Medication list given to you today. Your physician has requested that you have en exercise stress myoview. For further information please visit [email or web address] Please follow instruction sheet, as given. DX CHEST PAIN Your physician has requested that you have an echocardiogram. Echocardiography is a painless test that uses sound waves to create images of your heart. It provides your doctor with information about the size and shape of your heart and how well your heart's chambers and valves are working. This procedure takes approximately one hour. There are no restrictions for this procedure. DX DYSPNEA

## 2012-04-05 NOTE — Assessment & Plan Note (Signed)
Well controlled.  Continue current medications and low sodium Dash type diet.    

## 2012-04-05 NOTE — Assessment & Plan Note (Signed)
Atypical  Nonspecific ST/T wave changes and LAFB on ECG  Stress myovue

## 2012-04-05 NOTE — Progress Notes (Signed)
Patient ID: [name], female   DOB: May 03, 1930, 78 y.o.   MRN: [id number] you referred by Dr [name] for atypical chest pain and dyspnea.  Progressive over last few months.  Seems to be worse since hormones D/C.  She has exertional pressure in chest.  No rest pain.  No history of CAD.  Baseline ECG is normal.  No chronic lung disease.  Has gained a lot of weight last 2 years.  Pain is intermitant and related to exertion.  No palpitation edema or syncope.  Compliant with BP meds she has been on Rx for BP since age 37.  Some dietary caloric and salt indiscretion.    ROS: Denies fever, malais, weight loss, blurry vision, decreased visual acuity, cough, sputum, SOB, hemoptysis, pleuritic pain, palpitaitons, heartburn, abdominal pain, melena, lower extremity edema, claudication, or rash.  All other systems reviewed and negative   General: Affect appropriate Healthy:  appears stated age HEENT: normal Neck supple with no adenopathy JVP normal no bruits no thyromegaly Lungs clear with no wheezing and good diaphragmatic motion Heart:  S1/S2 no murmur,rub, gallop or click PMI normal Abdomen: benighn, BS positve, no tenderness, no AAA no bruit.  No HSM or HJR Distal pulses intact with no bruits No edema Neuro non-focal Skin warm and dry No muscular weakness  Medications Current Outpatient Prescriptions  Medication Sig Dispense Refill  . aspirin 81 MG tablet Take 81 mg by mouth daily.        Tery Sanfilippo Calcium (STOOL SOFTENER PO) Take by mouth as needed.      Marland Kitchen estradiol (VIVELLE-DOT) 0.0375 MG/24HR Place 1 patch onto the skin 2 (two) times a week.        Marland Kitchen lisinopril (PRINIVIL,ZESTRIL) 30 MG tablet Take 30 mg by mouth Daily.      . methocarbamol (ROBAXIN) 500 MG tablet Take 500 mg by mouth as needed.      . Multiple Vitamin (MULTIVITAMIN) capsule Take 1 capsule by mouth daily.      Marland Kitchen NEXIUM 40 MG capsule Take 40 mg by mouth daily.       . sertraline (ZOLOFT) 50 MG tablet Take 50 mg by  mouth daily.        . traMADol (ULTRAM) 50 MG tablet Take 50 mg by mouth every 6 (six) hours as needed.        . traZODone (DESYREL) 100 MG tablet Take 100 mg by mouth at bedtime.          Allergies Review of patient's allergies indicates no known allergies.  Family History: Family History  Problem Relation Age of Onset  . Colon cancer Mother 77  . Diabetes Brother     Social History: History   Social History  . Marital Status: Widowed    Spouse Name: N/A    Number of Children: 4  . Years of Education: N/A   Occupational History  . Retired     Airline pilot   Social History Main Topics  . Smoking status: Never Smoker   . Smokeless tobacco: Never Used  . Alcohol Use: No  . Drug Use: No  . Sexually Active: Not on file   Other Topics Concern  . Not on file   Social History Narrative  . No narrative on file    Electrocardiogram:  10/27/11  NSR LAFB nonspecific ST/T wave changes  Assessment and Plan

## 2012-04-05 NOTE — Assessment & Plan Note (Signed)
Likely functional  Check echo to assess RV/left ventricular function

## 2012-04-16 ENCOUNTER — Other Ambulatory Visit (HOSPITAL_COMMUNITY): Payer: Medicare Other

## 2012-04-17 ENCOUNTER — Encounter (HOSPITAL_COMMUNITY): Payer: Medicare Other

## 2012-04-23 ENCOUNTER — Ambulatory Visit (HOSPITAL_COMMUNITY): Payer: Medicare Other | Attending: Internal Medicine

## 2012-04-23 ENCOUNTER — Other Ambulatory Visit: Payer: Self-pay

## 2012-04-23 DIAGNOSIS — E785 Hyperlipidemia, unspecified: Secondary | ICD-10-CM | POA: Insufficient documentation

## 2012-04-23 DIAGNOSIS — I10 Essential (primary) hypertension: Secondary | ICD-10-CM | POA: Insufficient documentation

## 2012-04-23 DIAGNOSIS — R0602 Shortness of breath: Secondary | ICD-10-CM | POA: Insufficient documentation

## 2012-04-23 DIAGNOSIS — I059 Rheumatic mitral valve disease, unspecified: Secondary | ICD-10-CM | POA: Insufficient documentation

## 2012-04-23 DIAGNOSIS — M7989 Other specified soft tissue disorders: Secondary | ICD-10-CM | POA: Insufficient documentation

## 2012-04-23 DIAGNOSIS — R072 Precordial pain: Secondary | ICD-10-CM | POA: Insufficient documentation

## 2012-04-23 DIAGNOSIS — I079 Rheumatic tricuspid valve disease, unspecified: Secondary | ICD-10-CM | POA: Insufficient documentation

## 2012-04-24 ENCOUNTER — Ambulatory Visit (HOSPITAL_COMMUNITY): Payer: Medicare Other | Attending: Cardiology | Admitting: Radiology

## 2012-04-24 DIAGNOSIS — R0609 Other forms of dyspnea: Secondary | ICD-10-CM | POA: Insufficient documentation

## 2012-04-24 DIAGNOSIS — R0989 Other specified symptoms and signs involving the circulatory and respiratory systems: Secondary | ICD-10-CM | POA: Insufficient documentation

## 2012-04-24 DIAGNOSIS — R61 Generalized hyperhidrosis: Secondary | ICD-10-CM | POA: Insufficient documentation

## 2012-04-24 DIAGNOSIS — R079 Chest pain, unspecified: Secondary | ICD-10-CM

## 2012-04-24 DIAGNOSIS — R0789 Other chest pain: Secondary | ICD-10-CM | POA: Insufficient documentation

## 2012-04-24 DIAGNOSIS — R112 Nausea with vomiting, unspecified: Secondary | ICD-10-CM | POA: Insufficient documentation

## 2012-04-24 DIAGNOSIS — R0602 Shortness of breath: Secondary | ICD-10-CM

## 2012-04-24 DIAGNOSIS — I10 Essential (primary) hypertension: Secondary | ICD-10-CM | POA: Insufficient documentation

## 2012-04-24 MED ORDER — TECHNETIUM TC 99M TETROFOSMIN IV KIT
33.0000 | PACK | Freq: Once | INTRAVENOUS | Status: AC | PRN
  Administered 2012-04-24: 33 via INTRAVENOUS

## 2012-04-24 MED ORDER — TECHNETIUM TC 99M TETROFOSMIN IV KIT
11.0000 | PACK | Freq: Once | INTRAVENOUS | Status: AC | PRN
  Administered 2012-04-24: 11 via INTRAVENOUS

## 2012-04-24 MED ORDER — REGADENOSON 0.4 MG/5ML IV SOLN
0.4000 mg | Freq: Once | INTRAVENOUS | Status: AC
  Administered 2012-04-24: 0.4 mg via INTRAVENOUS

## 2012-04-24 NOTE — Progress Notes (Signed)
[facility] SITE 3 NUCLEAR MED [address] [place] [address] [phone]  Cardiology Nuclear Med Study  [name] is a 78 y.o. female     MRN : [id number]     DOB: Jul 28, 1930  Procedure Date: 04/24/2012  Nuclear Med Background Indication for Stress Test:  Evaluation for Ischemia History:  No previously noted cardiac history Cardiac Risk Factors: Hypertension  Symptoms:  Chest Pressure with and without Exertion (last episode of chest discomfort was about one month ago), Diaphoresis, DOE, Nausea and Vomiting   Nuclear Pre-Procedure Caffeine/Decaff Intake:  None> 12 hrs NPO After: 9:00pm   Lungs:  Clear. O2 Sat: 97% on room air. IV 0.9% NS with Angio Cath:  22g  IV Site: R Antecubital x 1, tolerated well IV Started by:  Irean Hong, RN  Chest Size (in):  44 Cup Size: D  Height: 5\' 10"  (1.778 m)  Weight:  216 lb (97.977 kg)  BMI:  Body mass index is 30.99 kg/(m^2). Tech Comments:  Changed to Lexiscan due to peripheral neuropathy in feet, and balance issues per patient.    Nuclear Med Study 1 or 2 day study: 1 day  Stress Test Type:  Lexiscan  Reading MD: Olga Millers, MD  Order Authorizing Provider:  Charlton Haws, MD  Resting Radionuclide: Technetium 69m Tetrofosmin  Resting Radionuclide Dose: 11.0 mCi   Stress Radionuclide:  Technetium 53m Tetrofosmin  Stress Radionuclide Dose: 32.8 mCi           Stress Protocol Rest HR: 70 Stress HR: 89  Rest BP: 119/72 Stress BP: 137/72  Exercise Time (min): n/a METS: n/a   Predicted Max HR: 141 bpm % Max HR: 63.12 bpm Rate Pressure Product: 21308   Dose of Adenosine (mg):  n/a Dose of Lexiscan: 0.4 mg  Dose of Atropine (mg): n/a Dose of Dobutamine: n/a mcg/kg/min (at max HR)  Stress Test Technologist: Smiley Houseman, CMA-N  Nuclear Technologist:  Domenic Polite, CNMT     Rest Procedure:  Myocardial perfusion imaging was performed at rest 45 minutes following the intravenous administration of  Technetium 43m Tetrofosmin.  Rest ECG: No acute changes  Stress Procedure:  The patient received IV Lexiscan 0.4 mg over 15-seconds.  Technetium 5m Tetrofosmin injected at 30-seconds.  There were no significant changes with Lexiscan.  Quantitative spect images were obtained after a 45 minute delay.  Stress ECG: No significant ST segment change suggestive of ischemia.  QPS Raw Data Images:  Acquisition technically good; normal left ventricular size. Stress Images:  Normal homogeneous uptake in all areas of the myocardium. Rest Images:  Normal homogeneous uptake in all areas of the myocardium. Subtraction (SDS):  No evidence of ischemia. Transient Ischemic Dilatation (Normal <1.22):  0.89 Lung/Heart Ratio (Normal <0.45):  0.35  Quantitative Gated Spect Images QGS EDV:  75 ml QGS ESV:  22 ml  Impression Exercise Capacity:  Lexiscan with no exercise. BP Response:  Normal blood pressure response. Clinical Symptoms:  No chest pain or dyspnea. ECG Impression:  No significant ST segment change suggestive of ischemia. Comparison with Prior Nuclear Study: No previous nuclear study performed  Overall Impression:  Normal stress nuclear study.  LV Ejection Fraction: 71%.  LV Wall Motion:  NL LV Function; NL Wall Motion   Olga Millers

## 2012-05-07 ENCOUNTER — Encounter: Payer: Self-pay | Admitting: *Deleted

## 2012-05-07 ENCOUNTER — Telehealth: Payer: Self-pay | Admitting: *Deleted

## 2012-05-07 NOTE — Telephone Encounter (Signed)
PT AWARE OF NORMAL MYOVIEW RESULTS .[name]

## 2012-06-19 ENCOUNTER — Other Ambulatory Visit: Payer: Self-pay | Admitting: Gynecology

## 2012-06-19 DIAGNOSIS — Z1231 Encounter for screening mammogram for malignant neoplasm of breast: Secondary | ICD-10-CM

## 2012-07-19 ENCOUNTER — Ambulatory Visit
Admission: RE | Admit: 2012-07-19 | Discharge: 2012-07-19 | Disposition: A | Discharge status: Home / Self Care | Payer: Medicare Other | Source: Ambulatory Visit | Attending: Gynecology | Admitting: Gynecology

## 2012-07-19 DIAGNOSIS — Z1231 Encounter for screening mammogram for malignant neoplasm of breast: Secondary | ICD-10-CM

## 2012-11-07 ENCOUNTER — Other Ambulatory Visit: Payer: Self-pay | Admitting: Internal Medicine

## 2012-12-03 ENCOUNTER — Other Ambulatory Visit: Payer: Self-pay | Admitting: Dermatology

## 2013-09-17 ENCOUNTER — Other Ambulatory Visit: Payer: Self-pay

## 2013-09-17 DIAGNOSIS — Z9889 Other specified postprocedural states: Secondary | ICD-10-CM

## 2013-09-17 DIAGNOSIS — Z1231 Encounter for screening mammogram for malignant neoplasm of breast: Secondary | ICD-10-CM

## 2013-10-14 ENCOUNTER — Ambulatory Visit
Admission: RE | Admit: 2013-10-14 | Discharge: 2013-10-14 | Disposition: A | Discharge status: Home / Self Care | Payer: Medicare Other | Source: Ambulatory Visit

## 2013-10-14 DIAGNOSIS — Z1231 Encounter for screening mammogram for malignant neoplasm of breast: Secondary | ICD-10-CM

## 2013-10-14 DIAGNOSIS — Z9889 Other specified postprocedural states: Secondary | ICD-10-CM

## 2014-01-29 ENCOUNTER — Encounter: Payer: Self-pay | Admitting: Gastroenterology

## 2014-02-13 ENCOUNTER — Ambulatory Visit: Payer: Medicare Other | Admitting: Internal Medicine

## 2014-02-20 ENCOUNTER — Ambulatory Visit (INDEPENDENT_AMBULATORY_CARE_PROVIDER_SITE_OTHER): Payer: Medicare Other | Admitting: Internal Medicine

## 2014-02-20 ENCOUNTER — Encounter: Payer: Self-pay | Admitting: Internal Medicine

## 2014-02-20 VITALS — BP 110/60 | HR 80 | Ht 70.0 in | Wt 222.0 lb

## 2014-02-20 DIAGNOSIS — K219 Gastro-esophageal reflux disease without esophagitis: Secondary | ICD-10-CM

## 2014-02-20 DIAGNOSIS — Z1211 Encounter for screening for malignant neoplasm of colon: Secondary | ICD-10-CM

## 2014-02-20 NOTE — Patient Instructions (Signed)
Please let me know if you are having any problems with your digestive tract.  As we discussed you do not need routine colonoscopy.  I appreciate the opportunity to care for you. Gatha Mayer, MD, [facility]al

## 2014-02-22 NOTE — Progress Notes (Signed)
Patient ID: [name], female   DOB: [birth year], 80 y.o.   MRN: [id number]        The patient is here to ask about a screening colonoscopy. Last was 10 years ago - no hx of polyps. No lower GI sxs at this time.  Her heartburn/GERD is well-controlled on daily pantoprazole.  Medications, allergies, past medical history, past surgical history, family history and social history are reviewed and updated in the EMR.   Filed Vitals:   02/20/14 1507  BP: 110/60  Pulse: 80   GERD (gastroesophageal reflux disease)  Special screening for malignant neoplasms, colon  1) She has "aged out" of CRCA screening and accepts this. Understands she could develop colorectal cancer but that risks tend to outweigh benefits at her age. 2) Continue PPI for GERD 3) See me prn   FT:DDUKG,URKYHC G, MD

## 2014-07-10 ENCOUNTER — Other Ambulatory Visit: Payer: Self-pay | Admitting: Orthopedic Surgery

## 2014-07-10 NOTE — Progress Notes (Signed)
Preoperative surgical orders have been place into the Epic hospital system for [name] on 07/10/2014, 9:43 AM  by Mickel Crow for surgery on 08/06/2014.  Preop Total Hip - Anterior Approach orders including Experel Injecion, IV Tylenol, and IV Decadron as long as there are no contraindications to the above medications. Arlee Muslim, PA-C

## 2014-07-28 ENCOUNTER — Encounter (HOSPITAL_COMMUNITY): Payer: Self-pay | Admitting: Pharmacist

## 2014-07-29 ENCOUNTER — Other Ambulatory Visit: Payer: Self-pay | Admitting: Surgical

## 2014-07-29 ENCOUNTER — Other Ambulatory Visit (HOSPITAL_COMMUNITY): Payer: Self-pay | Admitting: *Deleted

## 2014-07-29 NOTE — Patient Instructions (Addendum)
[name]  07/29/2014                           YOUR PROCEDURE IS SCHEDULED ON: 08/06/14               ENTER THRU [facility] ENTRANCE AND                            FOLLOW  SIGNS TO SHORT STAY CENTER                 ARRIVE AT SHORT STAY AT:  10:30 AM               CALL THIS NUMBER IF ANY PROBLEMS THE DAY OF SURGERY :               [phone]                                REMEMBER:   Do not eat food  AFTER MIDNIGHT             MAY HAVE CLEAR LIQUIDS ONLY UNTIL 7:00 AM   _______________________CLEAR LIQUID DIET __________________________   Foods Allowed                                                                     Foods Excluded  Coffee and tea, regular and decaf                             liquids that you cannot  Plain Jell-O in any flavor                                             see through such as: Fruit ices (not with fruit pulp)                                     milk, soups, orange juice  Iced Popsicles                                    All solid food Carbonated beverages, regular and diet                                    Cranberry, grape and apple juices Sports drinks like Gatorade Lightly seasoned clear broth or consume(fat free) Sugar, honey syrup  _____________________________________________________________________                    TAKE THESE MEDS THE MORNING OF SURGERY WITH SIPS OF WATER :                      FENFIBRATE / LYRICA / OMEPRAZOLE / SERTRALINE / MAY  TAKE TRAMADOL OR HYDROCODONE  IF NEEDED FOR PAIN               Do not wear jewelry, make-up   Do not wear lotions, powders, or perfumes.   Do not shave legs or underarms 12 hrs. before surgery (men may shave face)  Do not bring valuables to the hospital.  Contacts, dentures or bridgework may not be worn into surgery.  Leave suitcase in the car. After surgery it may be brought to your room.  For patients admitted to the hospital more than one night,  checkout time is            11:00 AM                                                          ________________________________________________________________________                                                      [place]  Before surgery, you can play an important role.  Because skin is not sterile, your skin needs to be as free of germs as possible.  You can reduce the number of germs on your skin by washing with CHG (chlorahexidine gluconate) soap before surgery.  CHG is an antiseptic cleaner which kills germs and bonds with the skin to continue killing germs even after washing. Please DO NOT use if you have an allergy to CHG or antibacterial soaps.  If your skin becomes reddened/irritated stop using the CHG and inform your nurse when you arrive at Short Stay. Do not shave (including legs and underarms) for at least 48 hours prior to the first CHG shower.  You may shave your face. Please follow these instructions carefully:   1.  Shower with CHG Soap the night before surgery and the  morning of Surgery.   2.  If you choose to wash your hair, wash your hair first as usual with your  normal  Shampoo.   3.  After you shampoo, rinse your hair and body thoroughly to remove the  shampoo.                                         4.  Use CHG as you would any other liquid soap.  You can apply chg directly  to the skin and wash . Gently wash with scrungie or clean wascloth    5.  Apply the CHG Soap to your body ONLY FROM THE NECK DOWN.   Do not use on open                           Wound or open sores. Avoid contact with eyes, ears mouth and genitals (private parts).                        Genitals (private parts) with your normal soap.              6.  Wash thoroughly, paying special attention to the area where your surgery  will be performed.   7.  Thoroughly rinse your body with warm water from the neck down.   8.  DO NOT shower/wash with your normal soap  after using and rinsing off  the CHG Soap .                9.  Pat yourself dry with a clean towel.             10.  Wear clean pajamas.             11.  Place clean sheets on your bed the night of your first shower and do not  sleep with pets.  Day of Surgery : Do not apply any lotions/deodorants the morning of surgery.  Please wear clean clothes to the hospital/surgery center.  FAILURE TO FOLLOW THESE INSTRUCTIONS MAY RESULT IN THE CANCELLATION OF YOUR SURGERY    PATIENT SIGNATURE_________________________________  ______________________________________________________________________     [name]  An incentive spirometer is a tool that can help keep your lungs clear and active. This tool measures how well you are filling your lungs with each breath. Taking long deep breaths may help reverse or decrease the chance of developing breathing (pulmonary) problems (especially infection) following:  A long period of time when you are unable to move or be active. BEFORE THE PROCEDURE   If the spirometer includes an indicator to show your best effort, your nurse or respiratory therapist will set it to a desired goal.  If possible, sit up straight or lean slightly forward. Try not to slouch.  Hold the incentive spirometer in an upright position. INSTRUCTIONS FOR USE  1. Sit on the edge of your bed if possible, or sit up as far as you can in bed or on a chair. 2. Hold the incentive spirometer in an upright position. 3. Breathe out normally. 4. Place the mouthpiece in your mouth and seal your lips tightly around it. 5. Breathe in slowly and as deeply as possible, raising the piston or the ball toward the top of the column. 6. Hold your breath for 3-5 seconds or for as long as possible. Allow the piston or ball to fall to the bottom of the column. 7. Remove the mouthpiece from your mouth and breathe out normally. 8. Rest for a few seconds and repeat Steps 1 through 7 at  least 10 times every 1-2 hours when you are awake. Take your time and take a few normal breaths between deep breaths. 9. The spirometer may include an indicator to show your best effort. Use the indicator as a goal to work toward during each repetition. 10. After each set of 10 deep breaths, practice coughing to be sure your lungs are clear. If you have an incision (the cut made at the time of surgery), support your incision when coughing by placing a pillow or rolled up towels firmly against it. Once you are able to get out of bed, walk around indoors and cough well. You may stop using the incentive spirometer when instructed by your caregiver.  RISKS AND COMPLICATIONS  Take your time so you do not get dizzy or light-headed.  If you are in pain, you may need to take or ask for pain medication before doing incentive spirometry. It is harder to take a deep breath if you are having pain. AFTER USE  Rest and breathe slowly and easily.  It can be helpful to  keep track of a log of your progress. Your caregiver can provide you with a simple table to help with this. If you are using the spirometer at home, follow these instructions: [place] IF:   You are having difficultly using the spirometer.  You have trouble using the spirometer as often as instructed.  Your pain medication is not giving enough relief while using the spirometer.  You develop fever of 100.5 F (38.1 C) or higher. SEEK IMMEDIATE MEDICAL CARE IF:   You cough up bloody sputum that had not been present before.  You develop fever of 102 F (38.9 C) or greater.  You develop worsening pain at or near the incision site. MAKE SURE YOU:   Understand these instructions.  Will watch your condition.  Will get help right away if you are not doing well or get worse. Document Released: 04/03/2007 Document Revised: 02/13/2012 Document Reviewed: 06/04/2007 Exit[employer] Patient Information 2014 ExitCare,  [place].   ________________________________________________________________________  WHAT IS A BLOOD TRANSFUSION? Blood Transfusion Information  A transfusion is the replacement of blood or some of its parts. Blood is made up of multiple cells which provide different functions.  Red blood cells carry oxygen and are used for blood loss replacement.  White blood cells fight against infection.  Platelets control bleeding.  Plasma helps clot blood.  Other blood products are available for specialized needs, such as hemophilia or other clotting disorders. BEFORE THE TRANSFUSION  Who gives blood for transfusions?   Healthy volunteers who are fully evaluated to make sure their blood is safe. This is blood bank blood. Transfusion therapy is the safest it has ever been in the practice of medicine. Before blood is taken from a donor, a complete history is taken to make sure that person has no history of diseases nor engages in risky social behavior (examples are intravenous drug use or sexual activity with multiple partners). The donor's travel history is screened to minimize risk of transmitting infections, such as malaria. The donated blood is tested for signs of infectious diseases, such as HIV and hepatitis. The blood is then tested to be sure it is compatible with you in order to minimize the chance of a transfusion reaction. If you or a relative donates blood, this is often done in anticipation of surgery and is not appropriate for emergency situations. It takes many days to process the donated blood. RISKS AND COMPLICATIONS Although transfusion therapy is very safe and saves many lives, the main dangers of transfusion include:   Getting an infectious disease.  Developing a transfusion reaction. This is an allergic reaction to something in the blood you were given. Every precaution is taken to prevent this. The decision to have a blood transfusion has been considered carefully by your caregiver  before blood is given. Blood is not given unless the benefits outweigh the risks. AFTER THE TRANSFUSION  Right after receiving a blood transfusion, you will usually feel much better and more energetic. This is especially true if your red blood cells have gotten low (anemic). The transfusion raises the level of the red blood cells which carry oxygen, and this usually causes an energy increase.  The nurse administering the transfusion will monitor you carefully for complications. HOME CARE INSTRUCTIONS  No special instructions are needed after a transfusion. You may find your energy is better. Speak with your caregiver about any limitations on activity for underlying diseases you may have. SEEK MEDICAL CARE IF:   Your condition is not improving after  your transfusion.  You develop redness or irritation at the intravenous (IV) site. SEEK IMMEDIATE MEDICAL CARE IF:  Any of the following symptoms occur over the next 12 hours:  Shaking chills.  You have a temperature by mouth above 102 F (38.9 C), not controlled by medicine.  Chest, back, or muscle pain.  People around you feel you are not acting correctly or are confused.  Shortness of breath or difficulty breathing.  Dizziness and fainting.  You get a rash or develop hives.  You have a decrease in urine output.  Your urine turns a dark color or changes to pink, red, or brown. Any of the following symptoms occur over the next 10 days:  You have a temperature by mouth above 102 F (38.9 C), not controlled by medicine.  Shortness of breath.  Weakness after normal activity.  The white part of the eye turns yellow (jaundice).  You have a decrease in the amount of urine or are urinating less often.  Your urine turns a dark color or changes to pink, red, or brown. Document Released: 11/18/2000 Document Revised: 02/13/2012 Document Reviewed: 07/07/2008 [facility] Patient Information 2014 [place],  [place].  _______________________________________________________________________

## 2014-07-29 NOTE — H&P (Signed)
TOTAL HIP ADMISSION H&P  Patient is admitted for right total hip arthroplasty.  Subjective:  Chief Complaint: right hip pain  HPI: UNICE [name], 80 y.o. female, has a history of pain and functional disability in the right hip(s) due to arthritis and patient has failed non-surgical conservative treatments for greater than 12 weeks to include NSAID's and/or analgesics, corticosteriod injections, weight reduction as appropriate and activity modification.  Onset of symptoms was gradual starting 3 years ago with gradually worsening course since that time.The patient noted no past surgery on the right hip(s).  Patient currently rates pain in the right hip at 7 out of 10 with activity. Patient has night pain, worsening of pain with activity and weight bearing, pain that interfers with activities of daily living and pain with passive range of motion. Patient has evidence of subchondral cysts, periarticular osteophytes and joint space narrowing by imaging studies. This condition presents safety issues increasing the risk of falls.  There is no current active infection.  Patient Active Problem List   Diagnosis Date Noted  . Chest pain 04/05/2012  . Dyspnea 04/05/2012  . Intraductal papilloma of breast - left breast 09/13/2011  . HYPERLIPIDEMIA 02/14/2011  . DEPRESSION 02/14/2011  . PERIPHERAL NEUROPATHY 02/14/2011  . HYPERTENSION 02/14/2011  . GERD 02/14/2011  . OSTEOARTHRITIS 02/14/2011  . URINARY INCONTINENCE, STRESS 02/14/2011  . SKIN CANCER, HX OF 02/14/2011   Past Medical History  Diagnosis Date  . Arthritis   . Fibromyalgia   . Skin cancer   . Intraductal papilloma of breast, Left 07/07/2011  . SUI (stress urinary incontinence, female)   . GERD (gastroesophageal reflux disease)   . HLD (hyperlipidemia)   . Osteoarthritis   . Depression   . Peripheral neuropathy   . HTN (hypertension)   . Ulcerative colitis   . Gastroenteritis   . Anemia   . Internal hemorrhoids   . Acute  ischemic colitis 01/2011  . Reflux     Past Surgical History  Procedure Laterality Date  . Joint replacement Bilateral 2010 and 2012  . Breast excisional biopsy Left 08/10/2011     Dr Margot Chimes  . Breast surgery    . Carpal tunnel release Left   . Cesarean section    . Cholecystectomy    . Abdominal hysterectomy    . Colonoscopy  04/29/2003    internal hemorrhoids  . Appendectomy       Current outpatient prescriptions: aspirin 81 MG tablet, Take 81 mg by mouth daily.  , Disp: , Rfl: ;   bimatoprost (LUMIGAN) 0.01 % SOLN, Place 1 drop into both eyes at bedtime., Disp: , Rfl: ;   Cholecalciferol (VITAMIN D) 2000 UNITS tablet, Take 2,000 Units by mouth daily., Disp: , Rfl: ;   Docusate Calcium (STOOL SOFTENER PO), Take 100 mg by mouth 2 (two) times daily. , Disp: , Rfl:  estradiol (CLIMARA - DOSED IN MG/24 HR) 0.05 mg/24hr patch, Place 0.05 mg onto the skin once a week. Mondays., Disp: , Rfl: ;   fenofibrate (TRICOR) 48 MG tablet, Take 48 mg by mouth daily. , Disp: , Rfl: ;   hydrochlorothiazide (HYDRODIURIL) 25 MG tablet, Take 25 mg by mouth daily. , Disp: , Rfl: ;   HYDROcodone-acetaminophen (NORCO/VICODIN) 5-325 MG per tablet, Take 1 tablet by mouth every 4 (four) hours as needed for moderate pain., Disp: , Rfl:  lisinopril (PRINIVIL,ZESTRIL) 30 MG tablet, Take 30 mg by mouth Daily., Disp: , Rfl: ;   LYRICA 50 MG capsule, Take 50  mg by mouth daily. , Disp: , Rfl: ;   Misc Natural Products (TRIPLE FLEX) CAPS, Take 1 capsule by mouth daily., Disp: , Rfl: ;   Multiple Vitamin (MULTIVITAMIN) capsule, Take 1 capsule by mouth daily., Disp: , Rfl: ;   omeprazole (PRILOSEC) 40 MG capsule, Take 40 mg by mouth daily., Disp: , Rfl:  sertraline (ZOLOFT) 50 MG tablet, Take 50 mg by mouth daily. , Disp: , Rfl: ;   simvastatin (ZOCOR) 20 MG tablet, Take 20 mg by mouth at bedtime. , Disp: , Rfl: ;   traMADol (ULTRAM) 50 MG tablet, Take 50-100 mg by mouth every 6 (six) hours as needed for moderate pain.  , Disp: , Rfl: ;   traZODone (DESYREL) 100 MG tablet, Take 200 mg by mouth at bedtime. , Disp: , Rfl:   Allergies  Allergen Reactions  . Procaine     Novocaine caused rash and face blisters.  . Epinephrine     Rapid heart beat    History  Substance Use Topics  . Smoking status: Never Smoker   . Smokeless tobacco: Never Used  . Alcohol Use: No    Family History  Problem Relation Age of Onset  . Colon cancer Mother 40  . Diabetes Brother      Review of Systems  Constitutional: Negative.   HENT: Negative.   Eyes: Negative.   Respiratory: Negative.   Cardiovascular: Negative.   Gastrointestinal: Negative.   Genitourinary: Positive for frequency. Negative for dysuria, urgency, hematuria and flank pain.  Musculoskeletal: Positive for back pain and joint pain. Negative for falls, myalgias and neck pain.       Right hip pain  Skin: Negative.   Neurological: Positive for tingling and sensory change. Negative for dizziness, tremors, speech change, focal weakness, seizures and loss of consciousness.  Endo/Heme/Allergies: Negative.   Psychiatric/Behavioral: Negative.     Objective:  Physical Exam  Constitutional: She is oriented to person, place, and time. She appears well-developed and well-nourished. No distress.  HENT:  Head: Normocephalic and atraumatic.  Right Ear: External ear normal.  Left Ear: External ear normal.  Nose: Nose normal.  Mouth/Throat: Oropharynx is clear and moist.  Eyes: Conjunctivae and EOM are normal.  Neck: Normal range of motion. Neck supple.  Cardiovascular: Normal rate, regular rhythm, normal heart sounds and intact distal pulses.   No murmur heard. Respiratory: Effort normal and breath sounds normal. No respiratory distress. She has no wheezes.  GI: Soft. Bowel sounds are normal. She exhibits no distension. There is no tenderness.  Musculoskeletal:       Right hip: She exhibits decreased range of motion and decreased strength.       Left hip:  Normal.       Right knee: Normal.       Left knee: Normal.       Right lower leg: She exhibits no tenderness and no swelling.       Left lower leg: She exhibits no tenderness and no swelling.   Her right hip can be flexed to 100. No internal rotation. About 10-20 external rotation and 20 abduction. Left hip flexion to 110, rotation in 20, out 30, and abduction 30 without discomfort.  Neurological: She is alert and oriented to person, place, and time. She has normal strength and normal reflexes. A sensory deficit is present.  Skin: No rash noted. She is not diaphoretic. No erythema.  Psychiatric: She has a normal mood and affect. Her behavior is normal.   Vitals  Weight: 189 lb Height: 70in Body Surface Area: 2.06 m Body Mass Index: 27.12 kg/m Pulse: 84 (Regular)  BP: 138/82 (Sitting, Left Arm, Standard)   Imaging Review Plain radiographs demonstrate severe degenerative joint disease of the right hip(s). The bone quality appears to be good for age and reported activity level.  Assessment/Plan:  End stage arthritis, right hip(s)  The patient history, physical examination, clinical judgement of the provider and imaging studies are consistent with end stage degenerative joint disease of the right hip(s) and total hip arthroplasty is deemed medically necessary. The treatment options including medical management, injection therapy, arthroscopy and arthroplasty were discussed at length. The risks and benefits of total hip arthroplasty were presented and reviewed. The risks due to aseptic loosening, infection, stiffness, dislocation/subluxation,  thromboembolic complications and other imponderables were discussed.  The patient acknowledged the explanation, agreed to proceed with the plan and consent was signed. Patient is being admitted for inpatient treatment for surgery, pain control, PT, OT, prophylactic antibiotics, VTE prophylaxis, progressive ambulation and ADL's and discharge  planning.The patient is planning to be discharged to skilled nursing facility ([facility])  PCP: Dr. Ronnald Ramp [facility])    Ardeen Jourdain PA-C

## 2014-07-30 ENCOUNTER — Emergency Department (HOSPITAL_COMMUNITY)
Admission: EM | Admit: 2014-07-30 | Discharge: 2014-07-30 | Discharge status: Against Medical Advice | Payer: Medicare Other

## 2014-07-30 ENCOUNTER — Encounter (HOSPITAL_COMMUNITY)
Admission: RE | Admit: 2014-07-30 | Discharge: 2014-07-30 | Disposition: A | Discharge status: Home / Self Care | Payer: Medicare Other | Source: Ambulatory Visit | Attending: Orthopedic Surgery | Admitting: Orthopedic Surgery

## 2014-07-30 ENCOUNTER — Encounter (HOSPITAL_COMMUNITY): Payer: Self-pay

## 2014-07-30 ENCOUNTER — Ambulatory Visit (HOSPITAL_COMMUNITY)
Admission: RE | Admit: 2014-07-30 | Discharge: 2014-07-30 | Disposition: A | Discharge status: Home / Self Care | Payer: Medicare Other | Source: Ambulatory Visit | Attending: Anesthesiology | Admitting: Anesthesiology

## 2014-07-30 ENCOUNTER — Ambulatory Visit (HOSPITAL_COMMUNITY)
Admission: RE | Admit: 2014-07-30 | Discharge: 2014-07-30 | Disposition: A | Discharge status: Home / Self Care | Payer: Medicare Other | Source: Ambulatory Visit | Attending: Orthopedic Surgery | Admitting: Orthopedic Surgery

## 2014-07-30 DIAGNOSIS — I10 Essential (primary) hypertension: Secondary | ICD-10-CM | POA: Diagnosis not present

## 2014-07-30 DIAGNOSIS — Z7901 Long term (current) use of anticoagulants: Secondary | ICD-10-CM | POA: Insufficient documentation

## 2014-07-30 DIAGNOSIS — Z01818 Encounter for other preprocedural examination: Secondary | ICD-10-CM | POA: Diagnosis present

## 2014-07-30 HISTORY — DX: Shortness of breath: R06.02

## 2014-07-30 HISTORY — DX: Personal history of other malignant neoplasm of skin: Z85.828

## 2014-07-30 HISTORY — DX: Adverse effect of unspecified anesthetic, initial encounter: T41.45XA

## 2014-07-30 HISTORY — DX: Other complications of anesthesia, initial encounter: T88.59XA

## 2014-07-30 LAB — URINE MICROSCOPIC-ADD ON

## 2014-07-30 LAB — URINALYSIS, ROUTINE W REFLEX MICROSCOPIC
Bilirubin Urine: NEGATIVE
Glucose, UA: NEGATIVE mg/dL
Hgb urine dipstick: NEGATIVE
KETONES UR: NEGATIVE mg/dL
NITRITE: NEGATIVE
PROTEIN: NEGATIVE mg/dL
SPECIFIC GRAVITY, URINE: 1.023 (ref 1.005–1.030)
Urobilinogen, UA: 0.2 mg/dL (ref 0.0–1.0)
pH: 6 (ref 5.0–8.0)

## 2014-07-30 LAB — CBC
HEMATOCRIT: 39.3 % (ref 36.0–46.0)
Hemoglobin: 13.2 g/dL (ref 12.0–15.0)
MCH: 29.2 pg (ref 26.0–34.0)
MCHC: 33.6 g/dL (ref 30.0–36.0)
MCV: 86.9 fL (ref 78.0–100.0)
PLATELETS: 227 10*3/uL (ref 150–400)
RBC: 4.52 MIL/uL (ref 3.87–5.11)
RDW: 13.8 % (ref 11.5–15.5)
WBC: 8 10*3/uL (ref 4.0–10.5)

## 2014-07-30 LAB — COMPREHENSIVE METABOLIC PANEL
ALT: 15 U/L (ref 0–35)
AST: 20 U/L (ref 0–37)
Albumin: 3.9 g/dL (ref 3.5–5.2)
Alkaline Phosphatase: 58 U/L (ref 39–117)
Anion gap: 13 (ref 5–15)
BUN: 15 mg/dL (ref 6–23)
CALCIUM: 10.4 mg/dL (ref 8.4–10.5)
CO2: 25 meq/L (ref 19–32)
CREATININE: 0.68 mg/dL (ref 0.50–1.10)
Chloride: 99 mEq/L (ref 96–112)
GFR calc Af Amer: >90 mL/min (ref >90)
GFR calc non Af Amer: 79 mL/min — ABNORMAL LOW (ref >90)
Glucose, Bld: 101 mg/dL — ABNORMAL HIGH (ref 70–99)
Potassium: 3.8 mEq/L (ref 3.7–5.3)
Sodium: 137 mEq/L (ref 137–147)
Total Bilirubin: 0.5 mg/dL (ref 0.3–1.2)
Total Protein: 6.8 g/dL (ref 6.0–8.3)

## 2014-07-30 LAB — APTT: aPTT: 30 seconds (ref 24–37)

## 2014-07-30 LAB — PROTIME-INR
INR: 0.97 (ref 0.00–1.49)
Prothrombin Time: 12.9 seconds (ref 11.6–15.2)

## 2014-07-30 LAB — SURGICAL PCR SCREEN
MRSA, PCR: NEGATIVE
STAPHYLOCOCCUS AUREUS: POSITIVE — AB

## 2014-07-30 NOTE — Progress Notes (Signed)
Abnormal UA faxed to Dr.Aluisio 

## 2014-08-06 ENCOUNTER — Encounter (HOSPITAL_COMMUNITY): Payer: Self-pay

## 2014-08-06 ENCOUNTER — Inpatient Hospital Stay (HOSPITAL_COMMUNITY): Payer: Medicare Other

## 2014-08-06 ENCOUNTER — Encounter (HOSPITAL_COMMUNITY): Payer: Medicare Other | Admitting: Anesthesiology

## 2014-08-06 ENCOUNTER — Inpatient Hospital Stay (HOSPITAL_COMMUNITY): Payer: Medicare Other | Admitting: Anesthesiology

## 2014-08-06 ENCOUNTER — Inpatient Hospital Stay (HOSPITAL_COMMUNITY)
Admission: RE | Admit: 2014-08-06 | Discharge: 2014-08-08 | DRG: 470 | Disposition: A | Discharge status: Skilled Nursing Facility | Payer: Medicare Other | Source: Ambulatory Visit | Attending: Orthopedic Surgery | Admitting: Orthopedic Surgery

## 2014-08-06 ENCOUNTER — Encounter (HOSPITAL_COMMUNITY)
Admission: RE | Disposition: A | Discharge status: Skilled Nursing Facility | Payer: Self-pay | Source: Ambulatory Visit | Attending: Orthopedic Surgery

## 2014-08-06 DIAGNOSIS — Z79899 Other long term (current) drug therapy: Secondary | ICD-10-CM

## 2014-08-06 DIAGNOSIS — Z800 Family history of malignant neoplasm of digestive organs: Secondary | ICD-10-CM | POA: Diagnosis not present

## 2014-08-06 DIAGNOSIS — Z96641 Presence of right artificial hip joint: Secondary | ICD-10-CM

## 2014-08-06 DIAGNOSIS — M1610 Unilateral primary osteoarthritis, unspecified hip: Secondary | ICD-10-CM | POA: Diagnosis present

## 2014-08-06 DIAGNOSIS — Z6830 Body mass index (BMI) 30.0-30.9, adult: Secondary | ICD-10-CM | POA: Diagnosis not present

## 2014-08-06 DIAGNOSIS — M25559 Pain in unspecified hip: Secondary | ICD-10-CM | POA: Diagnosis present

## 2014-08-06 DIAGNOSIS — Z85828 Personal history of other malignant neoplasm of skin: Secondary | ICD-10-CM

## 2014-08-06 DIAGNOSIS — M169 Osteoarthritis of hip, unspecified: Secondary | ICD-10-CM | POA: Diagnosis present

## 2014-08-06 DIAGNOSIS — IMO0001 Reserved for inherently not codable concepts without codable children: Secondary | ICD-10-CM | POA: Diagnosis present

## 2014-08-06 DIAGNOSIS — K219 Gastro-esophageal reflux disease without esophagitis: Secondary | ICD-10-CM | POA: Diagnosis present

## 2014-08-06 DIAGNOSIS — Z833 Family history of diabetes mellitus: Secondary | ICD-10-CM | POA: Diagnosis not present

## 2014-08-06 DIAGNOSIS — I10 Essential (primary) hypertension: Secondary | ICD-10-CM | POA: Diagnosis present

## 2014-08-06 DIAGNOSIS — E785 Hyperlipidemia, unspecified: Secondary | ICD-10-CM | POA: Diagnosis present

## 2014-08-06 DIAGNOSIS — G609 Hereditary and idiopathic neuropathy, unspecified: Secondary | ICD-10-CM | POA: Diagnosis present

## 2014-08-06 DIAGNOSIS — M1611 Unilateral primary osteoarthritis, right hip: Secondary | ICD-10-CM

## 2014-08-06 DIAGNOSIS — Z7982 Long term (current) use of aspirin: Secondary | ICD-10-CM

## 2014-08-06 HISTORY — PX: TOTAL HIP ARTHROPLASTY: SHX124

## 2014-08-06 LAB — TYPE AND SCREEN
ABO/RH(D): O NEG
ANTIBODY SCREEN: POSITIVE
DAT, IGG: NEGATIVE

## 2014-08-06 SURGERY — ARTHROPLASTY, HIP, TOTAL, ANTERIOR APPROACH
Anesthesia: Spinal | Site: Hip | Laterality: Right

## 2014-08-06 MED ORDER — OMEPRAZOLE 20 MG PO CPDR
40.0000 mg | DELAYED_RELEASE_CAPSULE | Freq: Every day | ORAL | Status: DC
  Administered 2014-08-07 – 2014-08-08 (×2): 40 mg via ORAL
  Filled 2014-08-06 (×2): qty 2

## 2014-08-06 MED ORDER — SODIUM CHLORIDE 0.9 % IV SOLN: INTRAVENOUS | Status: DC

## 2014-08-06 MED ORDER — ONDANSETRON HCL 4 MG/2ML IJ SOLN
INTRAMUSCULAR | Status: DC | PRN
  Administered 2014-08-06: 4 mg via INTRAVENOUS

## 2014-08-06 MED ORDER — LACTATED RINGERS IV SOLN: INTRAVENOUS | Status: DC

## 2014-08-06 MED ORDER — OXYCODONE HCL 5 MG PO TABS
5.0000 mg | ORAL_TABLET | ORAL | Status: DC | PRN
  Administered 2014-08-06 – 2014-08-07 (×4): 10 mg via ORAL
  Administered 2014-08-07: 5 mg via ORAL
  Administered 2014-08-07 (×3): 10 mg via ORAL
  Administered 2014-08-07: 5 mg via ORAL
  Administered 2014-08-07 – 2014-08-08 (×3): 10 mg via ORAL
  Filled 2014-08-06 (×8): qty 2
  Filled 2014-08-06 (×2): qty 1
  Filled 2014-08-06 (×3): qty 2

## 2014-08-06 MED ORDER — LIDOCAINE HCL (CARDIAC) 20 MG/ML IV SOLN
INTRAVENOUS | Status: DC | PRN
  Administered 2014-08-06: 50 mg via INTRAVENOUS

## 2014-08-06 MED ORDER — DIPHENHYDRAMINE HCL 12.5 MG/5ML PO ELIX: 12.5000 mg | ORAL_SOLUTION | ORAL | Status: DC | PRN

## 2014-08-06 MED ORDER — CEFAZOLIN SODIUM-DEXTROSE 2-3 GM-% IV SOLR
INTRAVENOUS | Status: AC
  Filled 2014-08-06: qty 50

## 2014-08-06 MED ORDER — PHENOL 1.4 % MT LIQD: 1.0000 | OROMUCOSAL | Status: DC | PRN

## 2014-08-06 MED ORDER — PROMETHAZINE HCL 25 MG/ML IJ SOLN: 6.2500 mg | INTRAMUSCULAR | Status: DC | PRN

## 2014-08-06 MED ORDER — ONDANSETRON HCL 4 MG PO TABS
4.0000 mg | ORAL_TABLET | Freq: Four times a day (QID) | ORAL | Status: DC | PRN
Start: 2014-08-06 — End: 2014-08-08

## 2014-08-06 MED ORDER — PHENYLEPHRINE HCL 10 MG/ML IJ SOLN
INTRAMUSCULAR | Status: DC | PRN
  Administered 2014-08-06: 80 ug via INTRAVENOUS

## 2014-08-06 MED ORDER — ACETAMINOPHEN 10 MG/ML IV SOLN
1000.0000 mg | Freq: Once | INTRAVENOUS | Status: AC
  Administered 2014-08-06: 1000 mg via INTRAVENOUS
  Filled 2014-08-06: qty 100

## 2014-08-06 MED ORDER — PROPOFOL 10 MG/ML IV BOLUS
INTRAVENOUS | Status: AC
  Filled 2014-08-06: qty 20

## 2014-08-06 MED ORDER — PHENYLEPHRINE HCL 10 MG/ML IJ SOLN
INTRAMUSCULAR | Status: AC
  Filled 2014-08-06: qty 1

## 2014-08-06 MED ORDER — TRAZODONE HCL 100 MG PO TABS
200.0000 mg | ORAL_TABLET | Freq: Every day | ORAL | Status: DC
  Administered 2014-08-06 – 2014-08-07 (×2): 200 mg via ORAL
  Filled 2014-08-06 (×4): qty 2

## 2014-08-06 MED ORDER — DEXAMETHASONE 6 MG PO TABS
10.0000 mg | ORAL_TABLET | Freq: Every day | ORAL | Status: AC
  Administered 2014-08-07: 10 mg via ORAL
  Filled 2014-08-06: qty 1

## 2014-08-06 MED ORDER — POLYETHYLENE GLYCOL 3350 17 G PO PACK
17.0000 g | PACK | Freq: Every day | ORAL | Status: DC | PRN
  Administered 2014-08-07: 17 g via ORAL

## 2014-08-06 MED ORDER — DEXAMETHASONE SODIUM PHOSPHATE 10 MG/ML IJ SOLN
10.0000 mg | Freq: Every day | INTRAMUSCULAR | Status: AC
  Filled 2014-08-06: qty 1

## 2014-08-06 MED ORDER — DOCUSATE SODIUM 100 MG PO CAPS
100.0000 mg | ORAL_CAPSULE | Freq: Two times a day (BID) | ORAL | Status: DC
  Administered 2014-08-06 – 2014-08-08 (×4): 100 mg via ORAL

## 2014-08-06 MED ORDER — KETOROLAC TROMETHAMINE 15 MG/ML IJ SOLN
7.5000 mg | Freq: Four times a day (QID) | INTRAMUSCULAR | Status: AC | PRN
  Administered 2014-08-06: 7.5 mg via INTRAVENOUS
  Filled 2014-08-06: qty 1

## 2014-08-06 MED ORDER — HYDROCHLOROTHIAZIDE 25 MG PO TABS
25.0000 mg | ORAL_TABLET | Freq: Every day | ORAL | Status: DC
  Administered 2014-08-06 – 2014-08-08 (×3): 25 mg via ORAL
  Filled 2014-08-06 (×3): qty 1

## 2014-08-06 MED ORDER — FENTANYL CITRATE 0.05 MG/ML IJ SOLN
INTRAMUSCULAR | Status: AC
  Filled 2014-08-06: qty 2

## 2014-08-06 MED ORDER — MENTHOL 3 MG MT LOZG: 1.0000 | LOZENGE | OROMUCOSAL | Status: DC | PRN

## 2014-08-06 MED ORDER — DEXAMETHASONE SODIUM PHOSPHATE 10 MG/ML IJ SOLN
INTRAMUSCULAR | Status: AC
  Filled 2014-08-06: qty 1

## 2014-08-06 MED ORDER — METHOCARBAMOL 1000 MG/10ML IJ SOLN
500.0000 mg | Freq: Four times a day (QID) | INTRAMUSCULAR | Status: DC | PRN
  Administered 2014-08-06: 500 mg via INTRAVENOUS
  Filled 2014-08-06: qty 5

## 2014-08-06 MED ORDER — 0.9 % SODIUM CHLORIDE (POUR BTL) OPTIME
TOPICAL | Status: DC | PRN
  Administered 2014-08-06: 1000 mL

## 2014-08-06 MED ORDER — BUPIVACAINE HCL (PF) 0.25 % IJ SOLN
INTRAMUSCULAR | Status: DC | PRN
  Administered 2014-08-06: 30 mL

## 2014-08-06 MED ORDER — ESTRADIOL 0.05 MG/24HR TD PTWK: 0.0500 mg | MEDICATED_PATCH | TRANSDERMAL | Status: DC

## 2014-08-06 MED ORDER — POTASSIUM CHLORIDE IN NACL 20-0.9 MEQ/L-% IV SOLN
INTRAVENOUS | Status: DC
  Administered 2014-08-06: via INTRAVENOUS
  Filled 2014-08-06 (×3): qty 1000

## 2014-08-06 MED ORDER — SERTRALINE HCL 50 MG PO TABS
50.0000 mg | ORAL_TABLET | Freq: Every day | ORAL | Status: DC
  Administered 2014-08-07 – 2014-08-08 (×2): 50 mg via ORAL
  Filled 2014-08-06 (×2): qty 1

## 2014-08-06 MED ORDER — TRANEXAMIC ACID 100 MG/ML IV SOLN
1000.0000 mg | INTRAVENOUS | Status: AC
  Administered 2014-08-06: 1000 mg via INTRAVENOUS
  Filled 2014-08-06: qty 10

## 2014-08-06 MED ORDER — BISACODYL 10 MG RE SUPP
10.0000 mg | Freq: Every day | RECTAL | Status: DC | PRN
  Filled 2014-08-06: qty 1

## 2014-08-06 MED ORDER — MEPERIDINE HCL 50 MG/ML IJ SOLN: 6.2500 mg | INTRAMUSCULAR | Status: DC | PRN

## 2014-08-06 MED ORDER — MIDAZOLAM HCL 5 MG/5ML IJ SOLN
INTRAMUSCULAR | Status: DC | PRN
  Administered 2014-08-06 (×2): 1 mg via INTRAVENOUS

## 2014-08-06 MED ORDER — PREGABALIN 50 MG PO CAPS
50.0000 mg | ORAL_CAPSULE | Freq: Every day | ORAL | Status: DC
  Administered 2014-08-07 – 2014-08-08 (×2): 50 mg via ORAL
  Filled 2014-08-06 (×2): qty 1

## 2014-08-06 MED ORDER — CEFAZOLIN SODIUM-DEXTROSE 2-3 GM-% IV SOLR
2.0000 g | Freq: Four times a day (QID) | INTRAVENOUS | Status: AC
  Administered 2014-08-06 – 2014-08-07 (×2): 2 g via INTRAVENOUS
  Filled 2014-08-06 (×3): qty 50

## 2014-08-06 MED ORDER — ACETAMINOPHEN 325 MG PO TABS
650.0000 mg | ORAL_TABLET | Freq: Four times a day (QID) | ORAL | Status: DC | PRN
  Administered 2014-08-07 – 2014-08-08 (×2): 650 mg via ORAL
  Filled 2014-08-06 (×2): qty 2

## 2014-08-06 MED ORDER — BUPIVACAINE LIPOSOME 1.3 % IJ SUSP
INTRAMUSCULAR | Status: DC | PRN
  Administered 2014-08-06: 20 mL

## 2014-08-06 MED ORDER — SODIUM CHLORIDE 0.9 % IJ SOLN
INTRAMUSCULAR | Status: DC | PRN
  Administered 2014-08-06: 30 mL via INTRAVENOUS

## 2014-08-06 MED ORDER — FLEET ENEMA 7-19 GM/118ML RE ENEM: 1.0000 | ENEMA | Freq: Once | RECTAL | Status: AC | PRN

## 2014-08-06 MED ORDER — PROPOFOL INFUSION 10 MG/ML OPTIME
INTRAVENOUS | Status: DC | PRN
  Administered 2014-08-06: 25 ug/kg/min via INTRAVENOUS

## 2014-08-06 MED ORDER — LACTATED RINGERS IV SOLN
INTRAVENOUS | Status: DC | PRN
  Administered 2014-08-06 (×2): via INTRAVENOUS

## 2014-08-06 MED ORDER — LIDOCAINE HCL (CARDIAC) 20 MG/ML IV SOLN
INTRAVENOUS | Status: AC
  Filled 2014-08-06: qty 5

## 2014-08-06 MED ORDER — MORPHINE SULFATE 2 MG/ML IJ SOLN
1.0000 mg | INTRAMUSCULAR | Status: DC | PRN
  Administered 2014-08-07: 1 mg via INTRAVENOUS
  Filled 2014-08-06: qty 1

## 2014-08-06 MED ORDER — CEFAZOLIN SODIUM-DEXTROSE 2-3 GM-% IV SOLR
2.0000 g | INTRAVENOUS | Status: AC
  Administered 2014-08-06: 2 g via INTRAVENOUS

## 2014-08-06 MED ORDER — SODIUM CHLORIDE 0.9 % IJ SOLN
INTRAMUSCULAR | Status: AC
  Filled 2014-08-06: qty 50

## 2014-08-06 MED ORDER — FENTANYL CITRATE 0.05 MG/ML IJ SOLN
INTRAMUSCULAR | Status: DC | PRN
  Administered 2014-08-06 (×2): 50 ug via INTRAVENOUS

## 2014-08-06 MED ORDER — BUPIVACAINE HCL (PF) 0.5 % IJ SOLN
INTRAMUSCULAR | Status: AC
Start: 2014-08-06 — End: 2014-08-06
  Filled 2014-08-06: qty 30

## 2014-08-06 MED ORDER — ACETAMINOPHEN 500 MG PO TABS
1000.0000 mg | ORAL_TABLET | Freq: Four times a day (QID) | ORAL | Status: AC
  Administered 2014-08-06 – 2014-08-07 (×4): 1000 mg via ORAL
  Filled 2014-08-06 (×5): qty 2

## 2014-08-06 MED ORDER — BUPIVACAINE HCL (PF) 0.25 % IJ SOLN
INTRAMUSCULAR | Status: AC
Start: 2014-08-06 — End: 2014-08-06
  Filled 2014-08-06: qty 30

## 2014-08-06 MED ORDER — METHOCARBAMOL 500 MG PO TABS
500.0000 mg | ORAL_TABLET | Freq: Four times a day (QID) | ORAL | Status: DC | PRN
  Administered 2014-08-07 – 2014-08-08 (×6): 500 mg via ORAL
  Filled 2014-08-06 (×6): qty 1

## 2014-08-06 MED ORDER — PHENYLEPHRINE 40 MCG/ML (10ML) SYRINGE FOR IV PUSH (FOR BLOOD PRESSURE SUPPORT)
PREFILLED_SYRINGE | INTRAVENOUS | Status: AC
  Filled 2014-08-06: qty 10

## 2014-08-06 MED ORDER — FENOFIBRATE 54 MG PO TABS
54.0000 mg | ORAL_TABLET | Freq: Every day | ORAL | Status: DC
  Administered 2014-08-07 – 2014-08-08 (×2): 54 mg via ORAL
  Filled 2014-08-06 (×2): qty 1

## 2014-08-06 MED ORDER — BUPIVACAINE HCL (PF) 0.5 % IJ SOLN
INTRAMUSCULAR | Status: DC | PRN
  Administered 2014-08-06: 3 mL

## 2014-08-06 MED ORDER — LATANOPROST 0.005 % OP SOLN
1.0000 [drp] | Freq: Every day | OPHTHALMIC | Status: DC
  Administered 2014-08-06 – 2014-08-07 (×2): 1 [drp] via OPHTHALMIC
  Filled 2014-08-06: qty 2.5

## 2014-08-06 MED ORDER — SIMVASTATIN 20 MG PO TABS
20.0000 mg | ORAL_TABLET | Freq: Every day | ORAL | Status: DC
Start: 2014-08-07 — End: 2014-08-08
  Administered 2014-08-07: 20 mg via ORAL
  Filled 2014-08-06 (×2): qty 1

## 2014-08-06 MED ORDER — EPHEDRINE SULFATE 50 MG/ML IJ SOLN
INTRAMUSCULAR | Status: AC
  Filled 2014-08-06: qty 1

## 2014-08-06 MED ORDER — RIVAROXABAN 10 MG PO TABS
10.0000 mg | ORAL_TABLET | Freq: Every day | ORAL | Status: DC
  Administered 2014-08-07 – 2014-08-08 (×2): 10 mg via ORAL
  Filled 2014-08-06 (×3): qty 1

## 2014-08-06 MED ORDER — MIDAZOLAM HCL 2 MG/2ML IJ SOLN
INTRAMUSCULAR | Status: AC
  Filled 2014-08-06: qty 2

## 2014-08-06 MED ORDER — DEXAMETHASONE SODIUM PHOSPHATE 10 MG/ML IJ SOLN
10.0000 mg | Freq: Once | INTRAMUSCULAR | Status: AC
  Administered 2014-08-06: 10 mg via INTRAVENOUS

## 2014-08-06 MED ORDER — CHLORHEXIDINE GLUCONATE 4 % EX LIQD: 60.0000 mL | Freq: Once | CUTANEOUS | Status: DC

## 2014-08-06 MED ORDER — ONDANSETRON HCL 4 MG/2ML IJ SOLN: 4.0000 mg | Freq: Four times a day (QID) | INTRAMUSCULAR | Status: DC | PRN

## 2014-08-06 MED ORDER — ONDANSETRON HCL 4 MG/2ML IJ SOLN
INTRAMUSCULAR | Status: AC
  Filled 2014-08-06: qty 2

## 2014-08-06 MED ORDER — PANTOPRAZOLE SODIUM 40 MG PO TBEC: 80.0000 mg | DELAYED_RELEASE_TABLET | Freq: Every day | ORAL | Status: DC

## 2014-08-06 MED ORDER — EPHEDRINE SULFATE 50 MG/ML IJ SOLN
INTRAMUSCULAR | Status: DC | PRN
  Administered 2014-08-06: 5 mg via INTRAVENOUS
  Administered 2014-08-06: 10 mg via INTRAVENOUS

## 2014-08-06 MED ORDER — PHENYLEPHRINE HCL 10 MG/ML IJ SOLN
10.0000 mg | INTRAVENOUS | Status: DC | PRN
  Administered 2014-08-06: 10 ug/min via INTRAVENOUS

## 2014-08-06 MED ORDER — ACETAMINOPHEN 650 MG RE SUPP: 650.0000 mg | Freq: Four times a day (QID) | RECTAL | Status: DC | PRN

## 2014-08-06 MED ORDER — METOCLOPRAMIDE HCL 5 MG/ML IJ SOLN: 5.0000 mg | Freq: Three times a day (TID) | INTRAMUSCULAR | Status: DC | PRN

## 2014-08-06 MED ORDER — BUPIVACAINE LIPOSOME 1.3 % IJ SUSP
20.0000 mL | Freq: Once | INTRAMUSCULAR | Status: DC
  Filled 2014-08-06: qty 20

## 2014-08-06 MED ORDER — METOCLOPRAMIDE HCL 10 MG PO TABS: 5.0000 mg | ORAL_TABLET | Freq: Three times a day (TID) | ORAL | Status: DC | PRN

## 2014-08-06 MED ORDER — FENTANYL CITRATE 0.05 MG/ML IJ SOLN: 25.0000 ug | INTRAMUSCULAR | Status: DC | PRN

## 2014-08-06 SURGICAL SUPPLY — 39 items
BAG ZIPLOCK 12X15 (MISCELLANEOUS) IMPLANT
BLADE EXTENDED COATED 6.5IN (ELECTRODE) ×3 IMPLANT
BLADE SAG 18X100X1.27 (BLADE) ×3 IMPLANT
CAPT HIP PF MOP ×3 IMPLANT
CLOSURE WOUND 1/2 X4 (GAUZE/BANDAGES/DRESSINGS) ×1
COVER PERINEAL POST (MISCELLANEOUS) ×3 IMPLANT
DECANTER SPIKE VIAL GLASS SM (MISCELLANEOUS) ×3 IMPLANT
DRAPE C-ARM 42X120 X-RAY (DRAPES) ×3 IMPLANT
DRAPE STERI IOBAN 125X83 (DRAPES) ×3 IMPLANT
DRAPE U-SHAPE 47X51 STRL (DRAPES) ×9 IMPLANT
DRSG ADAPTIC 3X8 NADH LF (GAUZE/BANDAGES/DRESSINGS) ×3 IMPLANT
DRSG MEPILEX BORDER 4X4 (GAUZE/BANDAGES/DRESSINGS) ×3 IMPLANT
DRSG MEPILEX BORDER 4X8 (GAUZE/BANDAGES/DRESSINGS) ×3 IMPLANT
DURAPREP 26ML APPLICATOR (WOUND CARE) ×3 IMPLANT
ELECT REM PT RETURN 9FT ADLT (ELECTROSURGICAL) ×3
ELECTRODE REM PT RTRN 9FT ADLT (ELECTROSURGICAL) ×1 IMPLANT
EVACUATOR 1/8 PVC DRAIN (DRAIN) ×3 IMPLANT
FACESHIELD WRAPAROUND (MASK) ×12 IMPLANT
GAUZE SPONGE 4X4 12PLY STRL (GAUZE/BANDAGES/DRESSINGS) IMPLANT
GLOVE BIO SURGEON STRL SZ7.5 (GLOVE) ×3 IMPLANT
GLOVE BIO SURGEON STRL SZ8 (GLOVE) ×6 IMPLANT
GLOVE BIOGEL PI IND STRL 8 (GLOVE) ×2 IMPLANT
GLOVE BIOGEL PI INDICATOR 8 (GLOVE) ×4
GOWN STRL REUS W/TWL LRG LVL3 (GOWN DISPOSABLE) ×3 IMPLANT
GOWN STRL REUS W/TWL XL LVL3 (GOWN DISPOSABLE) ×3 IMPLANT
KIT BASIN OR (CUSTOM PROCEDURE TRAY) ×3 IMPLANT
NDL SAFETY ECLIPSE 18X1.5 (NEEDLE) ×2 IMPLANT
NEEDLE HYPO 18GX1.5 SHARP (NEEDLE) ×4
PACK TOTAL JOINT (CUSTOM PROCEDURE TRAY) ×3 IMPLANT
STRIP CLOSURE SKIN 1/2X4 (GAUZE/BANDAGES/DRESSINGS) ×2 IMPLANT
SUT ETHIBOND NAB CT1 #1 30IN (SUTURE) ×3 IMPLANT
SUT MNCRL AB 4-0 PS2 18 (SUTURE) ×3 IMPLANT
SUT VIC AB 2-0 CT1 27 (SUTURE) ×4
SUT VIC AB 2-0 CT1 TAPERPNT 27 (SUTURE) ×2 IMPLANT
SUT VLOC 180 0 24IN GS25 (SUTURE) ×3 IMPLANT
SYR 20CC LL (SYRINGE) ×3 IMPLANT
SYR 50ML LL SCALE MARK (SYRINGE) ×3 IMPLANT
TOWEL OR 17X26 10 PK STRL BLUE (TOWEL DISPOSABLE) ×3 IMPLANT
TRAY FOLEY CATH 14FRSI W/METER (CATHETERS) ×3 IMPLANT

## 2014-08-06 NOTE — Op Note (Signed)
OPERATIVE REPORT  PREOPERATIVE DIAGNOSIS: Osteoarthritis of the Right hip.   POSTOPERATIVE DIAGNOSIS: Osteoarthritis of the Right  hip.   PROCEDURE: Right total hip arthroplasty, anterior approach.   SURGEON: Gaynelle Arabian, MD   ASSISTANT: Arlee Muslim, PA-C  ANESTHESIA:  Spinal  ESTIMATED BLOOD LOSS:- 300 ml  DRAINS: Hemovac x1.   COMPLICATIONS: None   CONDITION: PACU - hemodynamically stable.   BRIEF CLINICAL NOTE: [name] is a 80 y.o. female who has advanced end-  stage arthritis of his Right  hip with progressively worsening pain and  dysfunction.The patient has failed nonoperative management and presents for  total hip arthroplasty.   PROCEDURE IN DETAIL: After successful administration of spinal  anesthetic, the traction boots for the [facility] bed were placed on both  feet and the patient was placed onto the [facility] bed, boots placed into the leg  holders. The Right hip was then isolated from the perineum with plastic  drapes and prepped and draped in the usual sterile fashion. ASIS and  greater trochanter were marked and a oblique incision was made, starting  at about 1 cm lateral and 2 cm distal to the ASIS and coursing towards  the anterior cortex of the femur. The skin was cut with a 10 blade  through subcutaneous tissue to the level of the fascia overlying the  tensor fascia lata muscle. The fascia was then incised in line with the  incision at the junction of the anterior third and posterior 2/3rd. The  muscle was teased off the fascia and then the interval between the TFL  and the rectus was developed. The Hohmann retractor was then placed at  the top of the femoral neck over the capsule. The vessels overlying the  capsule were cauterized and the fat on top of the capsule was removed.  A Hohmann retractor was then placed anterior underneath the rectus  femoris to give exposure to the entire anterior capsule. A T-shaped  capsulotomy was performed.  The edges were tagged and the femoral head  was identified.       Osteophytes are removed off the superior acetabulum.  The femoral neck was then cut in situ with an oscillating saw. Traction  was then applied to the left lower extremity utilizing the [facility]  traction. The femoral head was then removed. Retractors were placed  around the acetabulum and then circumferential removal of the labrum was  performed. Osteophytes were also removed. Reaming starts at 47 mm to  medialize and  Increased in 2 mm increments to 53 mm. We reamed in  approximately 40 degrees of abduction, 20 degrees anteversion. A 54 mm  pinnacle acetabular shell was then impacted in anatomic position under  fluoroscopic guidance with excellent purchase. We did not need to place  any additional dome screws. A 36 mm neutral + 4 marathon liner was then  placed into the acetabular shell.       The femoral lift was then placed along the lateral aspect of the femur  just distal to the vastus ridge. The leg was  externally rotated and capsule  was stripped off the inferior aspect of the femoral neck down to the  level of the lesser trochanter, this was done with electrocautery. The femur was lifted after this was performed. The  leg was then placed and extended in adducted position to essentially delivering the femur. We also removed the capsule superiorly and the  piriformis from the piriformis  fossa to gain excellent exposure of the  proximal femur. Rongeur was used to remove some cancellous bone to get  into the lateral portion of the proximal femur for placement of the  initial starter reamer. The starter broaches was placed  the starter broach  and was shown to go down the center of the canal. Broaching  with the  Corail system was then performed starting at size 8, coursing  Up to size 14. A size 14 had excellent torsional and rotational  and axial stability. The trial standard offset neck was then placed  with a 36 + 1.5  trial head. The hip was then reduced. We confirmed that  the stem was in the canal both on AP and lateral x-rays. It also has excellent sizing. The hip was reduced with outstanding stability through full extension, full external rotation,  and then flexion in adduction internal rotation. AP pelvis was taken  and the leg lengths were measured and found to be exactly equal. Hip  was then dislocated again and the femoral head and neck removed. The  femoral broach was removed. Size 14 Corail stem with a standard offset  neck was then impacted into the femur following native anteversion. Has  excellent purchase in the canal. Excellent torsional and rotational and  axial stability. It is confirmed to be in the canal on AP and lateral  fluoroscopic views. The 36 + 1.5 metal head was placed and the hip  reduced with outstanding stability. Again AP pelvis was taken and it  confirmed that the leg lengths were equal. The wound was then copiously  irrigated with saline solution and the capsule reattached and repaired  with Ethibond suture.  20 mL of Exparel mixed with 50 mL of saline then additional 20 ml of .25% Bupivicaine injected into the capsule and into the edge of the tensor fascia lata as well as subcutaneous tissue. The fascia overlying the tensor fascia lata was  then closed with a running #1 V-Loc. Subcu was closed with interrupted  2-0 Vicryl and subcuticular running 4-0 Monocryl. Incision was cleaned  and dried. Steri-Strips and a bulky sterile dressing applied. Hemovac  drain was hooked to suction and then he was awakened and transported to  recovery in stable condition.        Please note that a surgical assistant was a medical necessity for this procedure to perform it in a safe and expeditious manner. Assistant was necessary to provide appropriate retraction of vital neurovascular structures and to prevent femoral fracture and allow for anatomic placement of the prosthesis.  Gaynelle Arabian,  M.D.

## 2014-08-06 NOTE — Anesthesia Postprocedure Evaluation (Signed)
  Anesthesia Post-op Note  Patient: [name]  Procedure(s) Performed: Procedure(s) (LRB): RIGHT TOTAL HIP ARTHROPLASTY ANTERIOR APPROACH (Right)  Patient Location: PACU  Anesthesia Type: Spinal  Level of Consciousness: awake and alert   Airway and Oxygen Therapy: Patient Spontanous Breathing  Post-op Pain: mild  Post-op Assessment: Post-op Vital signs reviewed, Patient's Cardiovascular Status Stable, Respiratory Function Stable, Patent Airway and No signs of Nausea or vomiting  Last Vitals:  Filed Vitals:   08/06/14 1515  BP: 102/59  Pulse:   Temp:   Resp:     Post-op Vital Signs: stable   Complications: No apparent anesthesia complications

## 2014-08-06 NOTE — Anesthesia Procedure Notes (Signed)
Spinal  Patient location during procedure: OR End time: 08/06/2014 1:26 PM Staffing CRNA/Resident: Noralyn Pick Performed by: anesthesiologist and resident/CRNA  Preanesthetic Checklist Completed: patient identified, site marked, surgical consent, pre-op evaluation, timeout performed, IV checked, risks and benefits discussed and monitors and equipment checked Spinal Block Patient position: sitting Prep: Betadine Patient monitoring: continuous pulse ox, blood pressure and heart rate Approach: midline Location: L2-3 Injection technique: single-shot Needle Needle type: Sprotte and Pencil-Tip  Needle gauge: 24 G Needle length: 9 cm Assessment Sensory level: T6 Additional Notes Expiration date of kit checked and confirmed. Patient tolerated procedure well, without complications.

## 2014-08-06 NOTE — Interval H&P Note (Signed)
History and Physical Interval Note:  08/06/2014 12:40 PM  [name]  has presented today for surgery, with the diagnosis of OA RIGHT HIP   The various methods of treatment have been discussed with the patient and family. After consideration of risks, benefits and other options for treatment, the patient has consented to  Procedure(s): RIGHT TOTAL HIP ARTHROPLASTY ANTERIOR APPROACH (Right) as a surgical intervention .  The patient's history has been reviewed, patient examined, no change in status, stable for surgery.  I have reviewed the patient's chart and labs.  Questions were answered to the patient's satisfaction.     Gearlean Alf

## 2014-08-06 NOTE — Anesthesia Preprocedure Evaluation (Signed)
Anesthesia Evaluation  Patient identified by MRN, date of birth, ID band Patient awake    Reviewed: Allergy & Precautions, H&P , NPO status , Patient's Chart, lab work & pertinent test results  History of Anesthesia Complications (+) history of anesthetic complications (suspect aspiration pneumonia after colonoscopy)  Airway Mallampati: II  TM Distance: >3 FB Neck ROM: Full    Dental no notable dental hx. (+) Caps   Pulmonary neg pulmonary ROS,  breath sounds clear to auscultation  Pulmonary exam normal       Cardiovascular hypertension, Pt. on medications Rhythm:Regular Rate:Normal     Neuro/Psych negative neurological ROS  negative psych ROS   GI/Hepatic negative GI ROS, Neg liver ROS,   Endo/Other  negative endocrine ROS  Renal/GU negative Renal ROS  negative genitourinary   Musculoskeletal  (+) Fibromyalgia -  Abdominal   Peds negative pediatric ROS (+)  Hematology negative hematology ROS (+)   Anesthesia Other Findings   Reproductive/Obstetrics negative OB ROS                            Anesthesia Physical  Anesthesia Plan  ASA: II  Anesthesia Plan: Spinal   Post-op Pain Management:    Induction:   Airway Management Planned: Simple Face Mask  Additional Equipment:   Intra-op Plan:   Post-operative Plan:   Informed Consent: I have reviewed the patients History and Physical, chart, labs and discussed the procedure including the risks, benefits and alternatives for the proposed anesthesia with the patient or authorized representative who has indicated his/her understanding and acceptance.   Dental advisory given  Plan Discussed with: CRNA  Anesthesia Plan Comments:         Anesthesia Quick Evaluation  

## 2014-08-06 NOTE — Transfer of Care (Signed)
Immediate Anesthesia Transfer of Care Note  Patient: [name]  Procedure(s) Performed: Procedure(s): RIGHT TOTAL HIP ARTHROPLASTY ANTERIOR APPROACH (Right)  Patient Location: PACU  Anesthesia Type:Regional  Level of Consciousness: awake, alert  and oriented  Airway & Oxygen Therapy: Patient Spontanous Breathing and Patient connected to face mask oxygen  Post-op Assessment: Report given to PACU RN and Post -op Vital signs reviewed and stable  Post vital signs: Reviewed and stable  Complications: No apparent anesthesia complications

## 2014-08-07 ENCOUNTER — Encounter (HOSPITAL_COMMUNITY): Payer: Self-pay | Admitting: Orthopedic Surgery

## 2014-08-07 LAB — BASIC METABOLIC PANEL
ANION GAP: 12 (ref 5–15)
BUN: 17 mg/dL (ref 6–23)
CALCIUM: 8.7 mg/dL (ref 8.4–10.5)
CHLORIDE: 100 meq/L (ref 96–112)
CO2: 25 meq/L (ref 19–32)
CREATININE: 0.68 mg/dL (ref 0.50–1.10)
GFR calc Af Amer: >90 mL/min (ref >90)
GFR calc non Af Amer: 79 mL/min — ABNORMAL LOW (ref >90)
Glucose, Bld: 150 mg/dL — ABNORMAL HIGH (ref 70–99)
Potassium: 3.7 mEq/L (ref 3.7–5.3)
Sodium: 137 mEq/L (ref 137–147)

## 2014-08-07 LAB — CBC
HCT: 31.5 % — ABNORMAL LOW (ref 36.0–46.0)
Hemoglobin: 10.7 g/dL — ABNORMAL LOW (ref 12.0–15.0)
MCH: 29.6 pg (ref 26.0–34.0)
MCHC: 34 g/dL (ref 30.0–36.0)
MCV: 87.3 fL (ref 78.0–100.0)
Platelets: 211 10*3/uL (ref 150–400)
RBC: 3.61 MIL/uL — AB (ref 3.87–5.11)
RDW: 13.6 % (ref 11.5–15.5)
WBC: 16 10*3/uL — ABNORMAL HIGH (ref 4.0–10.5)

## 2014-08-07 NOTE — Progress Notes (Signed)
Physical Therapy Treatment Patient Details Name: [name] MRN: [id number] DOB: 10/04/1930 Today's Date: 08/07/2014    History of Present Illness [name]    PT Comments    Progressing well.  Little more sore  This PM.  Follow Up Recommendations  SNF;Supervision/Assistance - 24 hour     Equipment Recommendations  None recommended by PT    Recommendations for Other Services       Precautions / Restrictions      Mobility  Bed Mobility   Bed Mobility: Sit to Supine       Sit to supine: Min assist   General bed mobility comments: R leg onto bed.  Transfers   Equipment used: Rolling walker (2 wheeled) Transfers: Sit to/from Stand Sit to Stand: [name] transfer comment: cues for safety, and placement and for R leg position  Ambulation/Gait Ambulation/Gait assistance: Min guard Ambulation Distance (Feet): 275 Feet   Gait Pattern/deviations: Step-through pattern     General Gait Details: pt  tolerated ambulation very well. cues for sequence and posture   Stairs            Wheelchair Mobility    Modified Rankin (Stroke Patients Only)       Balance                                    Cognition                            Exercises Total Joint Exercises Ankle Circles/Pumps: AROM;Both;10 reps;Supine Quad Sets: AROM;Both;10 reps;Supine Short Arc Quad: AROM;Right;10 reps;Supine Heel Slides: AAROM;Right;10 reps;Supine Hip ABduction/ADduction: AAROM;Right;10 reps;Supine    General Comments        Pertinent Vitals/Pain      Home Living                      Prior Function            PT Goals (current goals can now be found in the care plan section) Progress towards PT goals: Progressing toward goals    Frequency       PT Plan Current plan remains appropriate    Co-evaluation             End of Session   Activity Tolerance: Patient tolerated treatment well Patient  left: in bed;with call bell/phone within reach     Time: 1319-1344 PT Time Calculation (min): 25 min  Charges:  $Gait Training: 8-22 mins $Therapeutic Exercise: 8-22 mins                    G Codes:      [name] 08/07/2014, 4:43 PM

## 2014-08-07 NOTE — Discharge Instructions (Addendum)
Dr. Gaynelle Arabian Total Joint Specialist [facility] [address] [place], [place] [address] [phone]    ANTERIOR APPROACH TOTAL HIP REPLACEMENT POSTOPERATIVE DIRECTIONS   Hip Rehabilitation, Guidelines Following Surgery  The results of a hip operation are greatly improved after range of motion and muscle strengthening exercises. Follow all safety measures which are given to protect your hip. If any of these exercises cause increased pain or swelling in your joint, decrease the amount until you are comfortable again. Then slowly increase the exercises. Call your caregiver if you have problems or questions.  HOME CARE INSTRUCTIONS  Most of the following instructions are designed to prevent the dislocation of your new hip.  Remove items at home which could result in a fall. This includes throw rugs or furniture in walking pathways.  Continue medications as instructed at time of discharge.  You may have some home medications which will be placed on hold until you complete the course of blood thinner medication.  You may start showering once you are discharged home but do not submerge the incision under water. Just pat the incision dry and apply a dry gauze dressing on daily. Do not put on socks or shoes without following the instructions of your caregivers.  Sit on high chairs which makes it easier to stand.  Sit on chairs with arms. Use the chair arms to help push yourself up when arising.  Keep your leg on the side of the operation out in front of you when standing up.  Arrange for the use of a toilet seat elevator so you are not sitting low.    Walk with walker as instructed.  You may resume a sexual relationship in one month or when given the OK by your caregiver.  Use walker as long as suggested by your caregivers.  You may put full weight on your legs and walk as much as is comfortable. Avoid periods of inactivity such as sitting longer than an hour  when not asleep. This helps prevent blood clots.  You may return to work once you are cleared by Engineer, production.  Do not drive a car for 6 weeks or until released by your surgeon.  Do not drive while taking narcotics.  Wear elastic stockings for three weeks following surgery during the day but you may remove then at night.  Make sure you keep all of your appointments after your operation with all of your doctors and caregivers. You should call the office at the above phone number and make an appointment for approximately two weeks after the date of your surgery. DO NOT CHANGE MESH DRESSING.  LEAVE IN PLACE UNTIL THE FIRST FOLLOW UP VISIT WITH THE PHYSICIAN. Please pick up a stool softener and laxative for home use as long as you are requiring pain medications.  Continue to use ice on the hip for pain and swelling from surgery. You may notice swelling that will progress down to the foot and ankle.  This is normal after  surgery.  Elevate the leg when you are not up walking on it.   It is important for you to complete the blood thinner medication as prescribed by your doctor.  Continue to use the breathing machine which will help keep your temperature down.  It is common for your temperature to cycle up and down following surgery, especially at night when you are not up moving around and exerting yourself.  The breathing machine keeps your lungs expanded and your temperature down.  RANGE OF MOTION AND STRENGTHENING EXERCISES  These exercises are designed to help you keep full movement of your hip joint. Follow your caregiver's or physical therapist's instructions. Perform all exercises about fifteen times, three times per day or as directed. Exercise both hips, even if you have had only one joint replacement. These exercises can be done on a training (exercise) mat, on the floor, on a table or on a bed. Use whatever works the best and is most comfortable for you. Use music or television while you are  exercising so that the exercises are a pleasant break in your day. This will make your life better with the exercises acting as a break in routine you can look forward to.  Lying on your back, slowly slide your foot toward your buttocks, raising your knee up off the floor. Then slowly slide your foot back down until your leg is straight again.  Lying on your back spread your legs as far apart as you can without causing discomfort.  Lying on your side, raise your upper leg and foot straight up from the floor as far as is comfortable. Slowly lower the leg and repeat.  Lying on your back, tighten up the muscle in the front of your thigh (quadriceps muscles). You can do this by keeping your leg straight and trying to raise your heel off the floor. This helps strengthen the largest muscle supporting your knee.  Lying on your back, tighten up the muscles of your buttocks both with the legs straight and with the knee bent at a comfortable angle while keeping your heel on the floor.   SKILLED REHAB INSTRUCTIONS: If the patient is transferred to a skilled rehab facility following release from the hospital, a list of the current medications will be sent to the facility for the patient to continue.  When discharged from the skilled rehab facility, please have the facility set up the patient's [place] prior to being released. Also, the skilled facility will be responsible for providing the patient with their medications at time of release from the facility to include their pain medication, the muscle relaxants, and their blood thinner medication. If the patient is still at the rehab facility at time of the two week follow up appointment, the skilled rehab facility will also need to assist the patient in arranging follow up appointment in our office and any transportation needs.  MAKE SURE YOU:  Understand these instructions.  Will watch your condition.  Will get help right away if you are not  doing well or get worse.  Pick up stool softner and laxative for home. Do not submerge incision under water. May shower. Continue to use ice for pain and swelling from surgery. Total Hip Protocol.  Take Xarelto for two and a half more weeks, then discontinue Xarelto. Once the patient has completed the Xarelto, they may resume the 81 mg Aspirin.  When discharged from the skilled rehab facility, please have the facility set up the patient's [place] prior to being released.  Also provide the patient with their medications at time of release from the facility to include their pain medication, the muscle relaxants, and their blood thinner medication.  If the patient is still at the rehab facility at time of follow up appointment, please also assist the patient in arranging follow up appointment in our office and any transportation needs.   Decherd is a unique two-part system that  consists of a liquid adhesive and a polyester mesh. The liquid adhesive contains a highly purified 2-octylcyanoacrylate monomer, which, after polymerization, is stronger, more flexible, and less brittle 211 Furthermore, the adhesive's formulation includes proprietary additives to enhance strength, flexibility, and adherence to the skin1  The polyester mesh facilitates wound edge approximation and contains a chemical initiator to ensure consistent, reliable polymerization times1 DERMABOND PRINEO System was shown to provide statistically significant greater skin holding strength than skin staples or subcuticular suture.* Protective: Provides a flexible microbial barrier with 99% protection in vitro for 72 hours against organisms commonly responsible for surgical site infections (SSIs)  [place]ke demonstrated in vitro inhibition of gram positive (MRSA and MRSE) and gram negative bacteria (E. Coli)=  DO NOT REMOVE THE MESH PRINEO WOUND  CLOSURE DRESSING.  IT IS TO REMAIN IN PLACE FOR TWO WEEKS UNTIL THE PATIENT IS SEEN IN THE OFFICE FOR FOLLOW UP WITH DR. Wynelle Link. May cover the mesh dressing with 4x4 gauze dressings in order to keep the patient's underwear from rubbing against the mesh dressing. The patient may shower and allow the wound covering to get wet but do not submerge the dressing under water.  Information on my medicine - XARELTO (Rivaroxaban)  This medication education was reviewed with me or my healthcare representative as part of my discharge preparation.  The pharmacist that spoke with me during my hospital stay was:  WOFFORD, DREW A, RPH  Why was Xarelto prescribed for you? Xarelto was prescribed for you to reduce the risk of blood clots forming after orthopedic surgery. The medical term for these abnormal blood clots is venous thromboembolism (VTE).  What do you need to know about xarelto ? Take your Xarelto ONCE DAILY at the same time every day. You may take it either with or without food.  If you have difficulty swallowing the tablet whole, you may crush it and mix in applesauce just prior to taking your dose.  Take Xarelto exactly as prescribed by your doctor and DO NOT stop taking Xarelto without talking to the doctor who prescribed the medication.  Stopping without other VTE prevention medication to take the place of Xarelto may increase your risk of developing a clot.  After discharge, you should have regular check-up appointments with your healthcare provider that is prescribing your Xarelto.    What do you do if you miss a dose? If you miss a dose, take it as soon as you remember on the same day then continue your regularly scheduled once daily regimen the next day. Do not take two doses of Xarelto on the same day.   Important Safety Information A possible side effect of Xarelto is bleeding. You should call your healthcare provider right away if you experience any of the following:     Bleeding from an injury or your nose that does not stop.   Unusual colored urine (red or dark brown) or unusual colored stools (red or black).   Unusual bruising for unknown reasons.   A serious fall or if you hit your head (even if there is no bleeding).  Some medicines may interact with Xarelto and might increase your risk of bleeding while on Xarelto. To help avoid this, consult your healthcare provider or pharmacist prior to using any new prescription or non-prescription medications, including herbals, vitamins, non-steroidal anti-inflammatory drugs (NSAIDs) and supplements.  This website has more information on Xarelto: [email or web address]

## 2014-08-07 NOTE — Progress Notes (Signed)
Clinical Social Work Department CLINICAL SOCIAL WORK PLACEMENT NOTE 08/07/2014  Patient:  [name], [name]  Account Number:  [id number] Admit date:  08/06/2014  Clinical Social Worker:  Werner Lean, LCSW  Date/time:  08/07/2014 12:27 PM  Clinical Social Work is seeking post-discharge placement for this patient at the following level of care:   SKILLED NURSING   (*CSW will update this form in Epic as items are completed)     Patient/family provided with [place] Department of Clinical Social Work's list of facilities offering this level of care within the geographic area requested by the patient (or if unable, by the patient's family).  08/07/2014  Patient/family informed of their freedom to choose among providers that offer the needed level of care, that participate in Medicare, Medicaid or managed care program needed by the patient, have an available bed and are willing to accept the patient.    Patient/family informed of [facility]' ownership interest in [facility], as well as of the fact that they are under no obligation to receive care at this facility.  PASARR submitted to EDS on 08/06/2014 PASARR number received on 08/06/2014  FL2 transmitted to all facilities in geographic area requested by pt/family on  08/07/2014 FL2 transmitted to all facilities within larger geographic area on   Patient informed that his/her managed care company has contracts with or will negotiate with  certain facilities, including the following:     Patient/family informed of bed offers received:  08/07/2014 Patient chooses bed at [facility] Physician recommends and patient chooses bed at    Patient to be transferred to  on   Patient to be transferred to facility by  Patient and family notified of transfer on  Name of family member notified:    The following physician request were entered in Epic:   Additional Comments:  Werner Lean LCSW [phone]

## 2014-08-07 NOTE — Progress Notes (Signed)
Clinical Social Work Department BRIEF PSYCHOSOCIAL ASSESSMENT 08/07/2014  Patient:  [name], [name]     Account Number:  [id number]     Admit date:  08/06/2014  Clinical Social Worker:  Lacie Scotts  Date/Time:  08/07/2014 12:13 PM  Referred by:  Physician  Date Referred:  08/07/2014 Referred for  SNF Placement   Other Referral:   Interview type:  Patient Other interview type:    PSYCHOSOCIAL DATA Living Status:  WITH ADULT CHILDREN Admitted from facility:   Level of care:   Primary support name:  [name] Primary support relationship to patient:  CHILD, ADULT Degree of support available:   supportive    CURRENT CONCERNS Current Concerns  Post-Acute Placement   Other Concerns:    SOCIAL WORK ASSESSMENT / PLAN Pt is an 80 yr old female living at home prior to hospitalization. CSW met with pt to assist with d/c planning. This is a planned admission. Pt will most likely need ST Rehab following hospital d/c. Pt has made prior arrangements to have rehab at [facility]. CSW has contacted SNF and d/c plan has been confirmed. CSW will continue to follow to assist with d/c planning to SNF.   Assessment/plan status:  Psychosocial Support/Ongoing Assessment of Needs Other assessment/ plan:   Information/referral to community resources:   Insurance coverage for SNF and ambulance transport reviewed.    PATIENT'S/FAMILY'S RESPONSE TO PLAN OF CARE: Pt's mood is bright. States she had a good night following surgery. " I didn't sleep much but then I didn't expect I would. " Pt is motivated to work with PT and is looking forward to having rehab at [facility].   Werner Lean LCSW [phone]

## 2014-08-07 NOTE — Progress Notes (Signed)
OT Cancellation Note  Patient Details Name: [name] MRN: [id number] DOB: [birth year]   Cancelled Treatment:    Noted plans for [facility]. Will defer OT eval to SNF [facility], Thereasa Parkin 08/07/2014, 10:01 AM

## 2014-08-07 NOTE — Evaluation (Signed)
Physical Therapy Evaluation Patient Details Name: [name] MRN: [id number] DOB: 1930/01/28 Today's Date: 08/07/2014   History of Present Illness  [name]  Clinical Impression  Patient mobilizing very well. Patient will benefit from PT to address problems listed in note below.    Follow Up Recommendations SNF;Supervision/Assistance - 24 hour    Equipment Recommendations  None recommended by PT    Recommendations for Other Services       Precautions / Restrictions Precautions Precautions: Fall      Mobility  Bed Mobility Overal bed mobility: Needs Assistance Bed Mobility: Supine to Sit     Supine to sit: HOB elevated;Supervision        Transfers Overall transfer level: Needs assistance Equipment used: Rolling walker (2 wheeled) Transfers: Sit to/from Stand Sit to Stand: Min assist;From elevated surface         General transfer comment: cues for safety, and placement and for R leg position  Ambulation/Gait Ambulation/Gait assistance: Min assist Ambulation Distance (Feet): 250 Feet Assistive device: Rolling walker (2 wheeled) Gait Pattern/deviations: Step-through pattern;Step-to pattern     General Gait Details: pt  tolerated ambulation very well. cues for sequence and posture  Stairs            Wheelchair Mobility    Modified Rankin (Stroke Patients Only)       Balance                                             Pertinent Vitals/Pain Pain Assessment: 0-10 Pain Score: 0-No pain Pain Intervention(s): Premedicated before session    Home Living Family/patient expects to be discharged to:: Skilled nursing facility Living Arrangements: Alone                    Prior Function Level of Independence: Independent with assistive device(s)               Hand Dominance        Extremity/Trunk Assessment   Upper Extremity Assessment: Overall WFL for tasks assessed           Lower Extremity  Assessment: RLE deficits/detail RLE Deficits / Details: able to bear nearly full weight, able to advance  LE    Cervical / Trunk Assessment: Normal  Communication   Communication: No difficulties  Cognition Arousal/Alertness: Awake/alert Behavior During Therapy: WFL for tasks assessed/performed Overall Cognitive Status: Within Functional Limits for tasks assessed                      General Comments      Exercises        Assessment/Plan    PT Assessment Patient needs continued PT services  PT Diagnosis Difficulty walking   PT Problem List Decreased strength;Decreased range of motion;Decreased activity tolerance;Decreased knowledge of use of DME;Decreased safety awareness;Decreased knowledge of precautions  PT Treatment Interventions DME instruction;Gait training;Functional mobility training;Therapeutic activities;Therapeutic exercise;Patient/family education   PT Goals (Current goals can be found in the Care Plan section) Acute Rehab PT Goals Patient Stated Goal: to gdance at my grandson's wedding in october. PT Goal Formulation: With patient Time For Goal Achievement: 08/14/14 Potential to Achieve Goals: Good    Frequency 7X/week   Barriers to discharge Decreased caregiver support      Co-evaluation               End  of Session   Activity Tolerance: Patient tolerated treatment well Patient left: in chair;with call bell/phone within reach Nurse Communication: Mobility status         Time: 2633-3545 PT Time Calculation (min): 37 min   Charges:     PT Treatments $Gait Training: 23-37 mins $Self Care/Home Management: 8-22   PT G Codes:          [name] 08/07/2014, 10:15 AM Tresa Endo PT [phone]

## 2014-08-07 NOTE — Care Management Note (Signed)
    Page 1 of 1   08/07/2014     12:15:58 PM CARE MANAGEMENT NOTE 08/07/2014  Patient:  FRANCYNE, ARREAGA   Account Number:  [id number]  Date Initiated:  08/07/2014  Documentation initiated by:  [facility]  Subjective/Objective Assessment:   adm: right hip pain/RIGHT TOTAL HIP ARTHROPLASTY ANTERIOR APPROACH (Right)     Action/Plan:   SNF for rehab   Anticipated DC Date:  08/07/2014   Anticipated DC Plan:  [place]  CM consult      Choice offered to / List presented to:             Status of service:  Completed, signed off Medicare Important Message given?  NA - LOS <3 / Initial given by admissions (If response is "NO", the following Medicare IM given date fields will be blank) Date Medicare IM given:   Medicare IM given by:   Date Additional Medicare IM given:   Additional Medicare IM given by:    Discharge Disposition:  [place]  Per UR Regulation:  Reviewed for med. necessity/level of care/duration of stay  If discussed at [place] of Stay Meetings, dates discussed:    Comments:  08/07/14 12:10 CM notes SNF recc for rehab; CSW arranging placement at SNF.  No CM needs were communicated.  Mariane Masters, BSN, CM [phone].

## 2014-08-07 NOTE — Progress Notes (Signed)
   Subjective: 1 Day Post-Op Procedure(s) (LRB): RIGHT TOTAL HIP ARTHROPLASTY ANTERIOR APPROACH (Right) Patient reports pain as mild.   Patient seen in rounds by Dr. Wynelle Link. Patient is well, but has had some minor complaints of pain in the hip, requiring pain medications We will start therapy today.  Plan is to go [facility] after hospital stay.  Objective: Vital signs in last 24 hours: Temp:  [97.4 F (36.3 C)-98.1 F (36.7 C)] 97.4 F (36.3 C) (09/03 0526) Pulse Rate:  [72-87] 72 (09/03 0526) Resp:  [12-18] 16 (09/03 0526) BP: (95-130)/(48-86) 113/61 mmHg (09/03 0526) SpO2:  [91 %-100 %] 98 % (09/03 0526) Weight:  [95.255 kg (210 lb)] 95.255 kg (210 lb) (09/02 1646)  Intake/Output from previous day:  Intake/Output Summary (Last 24 hours) at 08/07/14 0901 Last data filed at 08/07/14 0700  Gross per 24 hour  Intake 3973.75 ml  Output   2760 ml  Net 1213.75 ml    Intake/Output this shift:    Labs:  Recent Labs  08/07/14 0450  HGB 10.7*    Recent Labs  08/07/14 0450  WBC 16.0*  RBC 3.61*  HCT 31.5*  PLT 211    Recent Labs  08/07/14 0450  NA 137  K 3.7  CL 100  CO2 25  BUN 17  CREATININE 0.68  GLUCOSE 150*  CALCIUM 8.7   No results found for this basename: LABPT, INR,  in the last 72 hours  EXAM General - Patient is Alert and Appropriate Extremity - Neurovascular intact Sensation intact distally Dressing - dressing C/D/I Motor Function - intact, moving foot and toes well on exam.  Hemovac pulled without difficulty.  Past Medical History  Diagnosis Date  . Arthritis   . Fibromyalgia   . Skin cancer   . SUI (stress urinary incontinence, female)   . GERD (gastroesophageal reflux disease)   . HLD (hyperlipidemia)   . Osteoarthritis   . Depression   . Peripheral neuropathy   . HTN (hypertension)   . Ulcerative colitis   . Anemia   . Internal hemorrhoids   . Complication of anesthesia     "I GOT PNEUMONIA THE NEXT DAY FROM THE  ANESTHESIA"  . Shortness of breath   . History of skin cancer     Assessment/Plan: 1 Day Post-Op Procedure(s) (LRB): RIGHT TOTAL HIP ARTHROPLASTY ANTERIOR APPROACH (Right) Principal Problem:   OA (osteoarthritis) of hip  Estimated body mass index is 30.13 kg/(m^2) as calculated from the following:   Height as of this encounter: 5\' 10"  (1.778 m).   Weight as of this encounter: 95.255 kg (210 lb). Advance diet Up with therapy Discharge to SNF  DVT Prophylaxis - Xarelto Weight Bearing As Tolerated right Leg Hemovac Pulled Begin Therapy  Arlee Muslim, PA-C Orthopaedic Surgery 08/07/2014, 9:01 AM

## 2014-08-08 LAB — BASIC METABOLIC PANEL
Anion gap: 11 (ref 5–15)
BUN: 16 mg/dL (ref 6–23)
CALCIUM: 9.5 mg/dL (ref 8.4–10.5)
CO2: 30 mEq/L (ref 19–32)
Chloride: 102 mEq/L (ref 96–112)
Creatinine, Ser: 0.73 mg/dL (ref 0.50–1.10)
GFR, EST AFRICAN AMERICAN: 90 mL/min — AB (ref >90)
GFR, EST NON AFRICAN AMERICAN: 77 mL/min — AB (ref >90)
GLUCOSE: 118 mg/dL — AB (ref 70–99)
POTASSIUM: 3.8 meq/L (ref 3.7–5.3)
Sodium: 143 mEq/L (ref 137–147)

## 2014-08-08 LAB — CBC
HEMATOCRIT: 28.9 % — AB (ref 36.0–46.0)
HEMOGLOBIN: 9.8 g/dL — AB (ref 12.0–15.0)
MCH: 29.5 pg (ref 26.0–34.0)
MCHC: 33.9 g/dL (ref 30.0–36.0)
MCV: 87 fL (ref 78.0–100.0)
Platelets: 183 10*3/uL (ref 150–400)
RBC: 3.32 MIL/uL — ABNORMAL LOW (ref 3.87–5.11)
RDW: 14.1 % (ref 11.5–15.5)
WBC: 13.7 10*3/uL — ABNORMAL HIGH (ref 4.0–10.5)

## 2014-08-08 MED ORDER — RIVAROXABAN 10 MG PO TABS: 10.0000 mg | ORAL_TABLET | Freq: Every day | ORAL | Status: DC

## 2014-08-08 MED ORDER — METOCLOPRAMIDE HCL 5 MG PO TABS: 5.0000 mg | ORAL_TABLET | Freq: Three times a day (TID) | ORAL | Status: DC | PRN

## 2014-08-08 MED ORDER — OXYCODONE HCL 5 MG PO TABS
5.0000 mg | ORAL_TABLET | ORAL | Status: DC | PRN
Start: 2014-08-08 — End: 2014-12-19

## 2014-08-08 MED ORDER — TRAMADOL HCL 50 MG PO TABS: 50.0000 mg | ORAL_TABLET | Freq: Four times a day (QID) | ORAL | Status: DC | PRN

## 2014-08-08 MED ORDER — BISACODYL 10 MG RE SUPP: 10.0000 mg | Freq: Every day | RECTAL | Status: DC | PRN

## 2014-08-08 MED ORDER — ONDANSETRON HCL 4 MG PO TABS: 4.0000 mg | ORAL_TABLET | Freq: Four times a day (QID) | ORAL | Status: DC | PRN

## 2014-08-08 MED ORDER — POLYETHYLENE GLYCOL 3350 17 G PO PACK: 17.0000 g | PACK | Freq: Every day | ORAL | Status: DC | PRN

## 2014-08-08 MED ORDER — METHOCARBAMOL 500 MG PO TABS: 500.0000 mg | ORAL_TABLET | Freq: Four times a day (QID) | ORAL | Status: DC | PRN

## 2014-08-08 MED ORDER — ACETAMINOPHEN 325 MG PO TABS: 650.0000 mg | ORAL_TABLET | Freq: Four times a day (QID) | ORAL | Status: DC | PRN

## 2014-08-08 NOTE — Progress Notes (Signed)
   Subjective: 2 Days Post-Op Procedure(s) (LRB): RIGHT TOTAL HIP ARTHROPLASTY ANTERIOR APPROACH (Right) Patient reports pain as mild.   Patient seen in rounds for Dr. Wynelle Link. Sitting up eating breakfast this morning. Patient is well, and has had no acute complaints or problems.  She is doing well. Patient is ready to go to [facility].  Objective: Vital signs in last 24 hours: Temp:  [98.6 F (37 C)-98.9 F (37.2 C)] 98.6 F (37 C) (09/04 0541) Pulse Rate:  [69-84] 69 (09/04 0541) Resp:  [16-18] 16 (09/04 0541) BP: (118-130)/(51-61) 130/59 mmHg (09/04 0541) SpO2:  [93 %-96 %] 96 % (09/04 0541)  Intake/Output from previous day:  Intake/Output Summary (Last 24 hours) at 08/08/14 0917 Last data filed at 08/08/14 0811  Gross per 24 hour  Intake   1440 ml  Output   3250 ml  Net  -1810 ml    Intake/Output this shift: Total I/O In: 240 [P.O.:240] Out: -   Labs:  Recent Labs  08/07/14 0450 08/08/14 0511  HGB 10.7* 9.8*    Recent Labs  08/07/14 0450 08/08/14 0511  WBC 16.0* 13.7*  RBC 3.61* 3.32*  HCT 31.5* 28.9*  PLT 211 183    Recent Labs  08/07/14 0450 08/08/14 0511  NA 137 143  K 3.7 3.8  CL 100 102  CO2 25 30  BUN 17 16  CREATININE 0.68 0.73  GLUCOSE 150* 118*  CALCIUM 8.7 9.5   No results found for this basename: LABPT, INR,  in the last 72 hours  EXAM: General - Patient is Alert, Appropriate and Oriented Extremity - Neurovascular intact Sensation intact distally Dorsiflexion/Plantar flexion intact Incision - clean, dry, no drainage.  The Dermabond Prineo dressing is in place over the right hip incision.  It is a mesh wound closure system.  Don not remove the mesh dressing and Dermabond glue.  It will stay in place for two weeks. Motor Function - intact, moving foot and toes well on exam.   Assessment/Plan: 2 Days Post-Op Procedure(s) (LRB): RIGHT TOTAL HIP ARTHROPLASTY ANTERIOR APPROACH (Right) Procedure(s) (LRB): RIGHT TOTAL HIP  ARTHROPLASTY ANTERIOR APPROACH (Right) Past Medical History  Diagnosis Date  . Arthritis   . Fibromyalgia   . Skin cancer   . SUI (stress urinary incontinence, female)   . GERD (gastroesophageal reflux disease)   . HLD (hyperlipidemia)   . Osteoarthritis   . Depression   . Peripheral neuropathy   . HTN (hypertension)   . Ulcerative colitis   . Anemia   . Internal hemorrhoids   . Complication of anesthesia     "I GOT PNEUMONIA THE NEXT DAY FROM THE ANESTHESIA"  . Shortness of breath   . History of skin cancer    Principal Problem:   OA (osteoarthritis) of hip  Estimated body mass index is 30.13 kg/(m^2) as calculated from the following:   Height as of this encounter: 5\' 10"  (1.778 m).   Weight as of this encounter: 95.255 kg (210 lb). Up with therapy Discharge to SNF Diet - Cardiac diet Follow up - in 2 weeks on Tuesday Sept. 15th. Activity - WBAT Disposition - Skilled nursing facility Condition Upon Discharge - Good D/C Meds - See DC Summary DVT Prophylaxis - [place], PA-C Orthopaedic Surgery 08/08/2014, 9:17 AM

## 2014-08-08 NOTE — Progress Notes (Signed)
Physical Therapy Treatment Patient Details Name: [name] MRN: [id number] DOB: [birth year] Today's Date: 08/08/2014    History of Present Illness [name]    PT Comments    Pt progressing well, reminders for safety.   Follow Up Recommendations  SNF;Supervision/Assistance - 24 hour     Equipment Recommendations       Recommendations for Other Services       Precautions / Restrictions Precautions Precautions: Fall    Mobility  Bed Mobility   Bed Mobility: Supine to Sit     Supine to sit: Min assist     General bed mobility comments: assist for R leg to edge when moving to L side  Transfers   Equipment used: Rolling walker (2 wheeled) Transfers: Sit to/from Stand Sit to Stand: Supervision         General transfer comment: cues for safety, and placement and for R leg position  Ambulation/Gait Ambulation/Gait assistance: Min guard Ambulation Distance (Feet): 200 Feet (plus 20') Assistive device: Rolling walker (2 wheeled) Gait Pattern/deviations: Step-through pattern;Decreased step length - right;Decreased stance time - right     General Gait Details: pt  tolerated ambulation very well. cues for sequence and posture, cues for safety as patient at times starts to walk away from RW, likes to do things in room in getting items from suitcase.   Stairs            Wheelchair Mobility    Modified Rankin (Stroke Patients Only)       Balance                                    Cognition Arousal/Alertness: Awake/alert                          Exercises      General Comments        Pertinent Vitals/Pain Pain Score: 3  Pain Descriptors / Indicators: Aching;Sore    Home Living                      Prior Function            PT Goals (current goals can now be found in the care plan section) Progress towards PT goals: Progressing toward goals    Frequency  7X/week    PT Plan Current plan remains  appropriate    Co-evaluation             End of Session   Activity Tolerance: Patient tolerated treatment well Patient left: in chair;with call bell/phone within reach     Time: 9678-9381 PT Time Calculation (min): 25 min  Charges:  $Gait Training: 23-37 mins                    G Codes:      [name] 08/08/2014, 9:47 AM Tresa Endo PT [phone]

## 2014-08-08 NOTE — Progress Notes (Signed)
Report called to Kathlee Nations at [facility].  Durwin Nora RN

## 2014-08-08 NOTE — Progress Notes (Signed)
Clinical Social Work Department CLINICAL SOCIAL WORK PLACEMENT NOTE 08/08/2014  Patient:  [name], [name]  Account Number:  [id number] Admit date:  08/06/2014  Clinical Social Worker:  Werner Lean, LCSW  Date/time:  08/07/2014 12:27 PM  Clinical Social Work is seeking post-discharge placement for this patient at the following level of care:   SKILLED NURSING   (*CSW will update this form in Epic as items are completed)     Patient/family provided with [place] Department of Clinical Social Work's list of facilities offering this level of care within the geographic area requested by the patient (or if unable, by the patient's family).  08/07/2014  Patient/family informed of their freedom to choose among providers that offer the needed level of care, that participate in Medicare, Medicaid or managed care program needed by the patient, have an available bed and are willing to accept the patient.    Patient/family informed of [facility]' ownership interest in [facility], as well as of the fact that they are under no obligation to receive care at this facility.  PASARR submitted to EDS on 08/06/2014 PASARR number received on 08/06/2014  FL2 transmitted to all facilities in geographic area requested by pt/family on  08/07/2014 FL2 transmitted to all facilities within larger geographic area on   Patient informed that his/her managed care company has contracts with or will negotiate with  certain facilities, including the following:     Patient/family informed of bed offers received:  08/07/2014 Patient chooses bed at [facility] Physician recommends and patient chooses bed at    Patient to be transferred to [place] on  08/08/2014 Patient to be transferred to facility by [facility] Patient and family notified of transfer on 08/08/2014 Name of family member notified:  Pt declined csw assistance.  The following physician request were entered in Epic:   Additional  Comments: Pt is in agreement with d/c to SNF today. PT agreed with transport by car. NSG reviewed d/c summary, scripts, avs. Scripts are included in d/c packet.  Werner Lean LCSW

## 2014-08-08 NOTE — Discharge Summary (Signed)
Physician Discharge Summary   Patient ID: [name] MRN: [id number] DOB/AGE: 1930-10-07 80 y.o.  Admit date: 08/06/2014 Discharge date: 08/08/2014  Primary Diagnosis:  Osteoarthritis of the Right hip.   Admission Diagnoses:  Past Medical History  Diagnosis Date  . Arthritis   . Fibromyalgia   . Skin cancer   . SUI (stress urinary incontinence, female)   . GERD (gastroesophageal reflux disease)   . HLD (hyperlipidemia)   . Osteoarthritis   . Depression   . Peripheral neuropathy   . HTN (hypertension)   . Ulcerative colitis   . Anemia   . Internal hemorrhoids   . Complication of anesthesia     "I GOT PNEUMONIA THE NEXT DAY FROM THE ANESTHESIA"  . Shortness of breath   . History of skin cancer    Discharge Diagnoses:   Principal Problem:   OA (osteoarthritis) of hip  Estimated body mass index is 30.13 kg/(m^2) as calculated from the following:   Height as of this encounter: _0  (1.778 m).   Weight as of this encounter: 95.255 kg (210 lb).  Procedure(s) (LRB): RIGHT TOTAL HIP ARTHROPLASTY ANTERIOR APPROACH (Right)   Consults: None  HPI: [name] is a 80 y.o. female who has advanced end-  stage arthritis of his Right hip with progressively worsening pain and  dysfunction.The patient has failed nonoperative management and presents for  total hip arthroplasty.   Laboratory Data: Admission on 08/06/2014  Component Date Value Ref Range Status  . ABO/RH(D) 08/06/2014 O NEG   Final  . Antibody Screen 08/06/2014 POS   Final  . Sample Expiration 08/06/2014 08/09/2014   Final  . Antibody Identification 26/41/5830 ANTI D   Final  . DAT, IgG 08/06/2014 NEG   Final  . Unit Number 08/06/2014 [id number]   Final  . Blood Component Type 08/06/2014 RBC CPDA1, LR   Final  . Unit division 08/06/2014 00   Final  . Status of Unit 08/06/2014 ALLOCATED   Final  . Donor AG Type 08/06/2014 NEGATIVE FOR C ANTIGEN   Final  . Transfusion Status 08/06/2014 OK TO TRANSFUSE    Final  . Crossmatch Result 08/06/2014 COMPATIBLE   Final  . Unit Number 08/06/2014 [id number]   Final  . Blood Component Type 08/06/2014 RBC LR PHER2   Final  . Unit division 08/06/2014 00   Final  . Status of Unit 08/06/2014 ALLOCATED   Final  . Donor AG Type 08/06/2014 NEGATIVE FOR C ANTIGEN   Final  . Transfusion Status 08/06/2014 OK TO TRANSFUSE   Final  . Crossmatch Result 08/06/2014 COMPATIBLE   Final  . WBC 08/07/2014 16.0* 4.0 - 10.5 K/uL Final  . RBC 08/07/2014 3.61* 3.87 - 5.11 MIL/uL Final  . Hemoglobin 08/07/2014 10.7* 12.0 - 15.0 g/dL Final  . HCT 08/07/2014 31.5* 36.0 - 46.0 % Final  . MCV 08/07/2014 87.3  78.0 - 100.0 fL Final  . MCH 08/07/2014 29.6  26.0 - 34.0 pg Final  . MCHC 08/07/2014 34.0  30.0 - 36.0 g/dL Final  . RDW 08/07/2014 13.6  11.5 - 15.5 % Final  . Platelets 08/07/2014 211  150 - 400 K/uL Final  . Sodium 08/07/2014 137  137 - 147 mEq/L Final  . Potassium 08/07/2014 3.7  3.7 - 5.3 mEq/L Final  . Chloride 08/07/2014 100  96 - 112 mEq/L Final  . CO2 08/07/2014 25  19 - 32 mEq/L Final  . Glucose, Bld 08/07/2014 150* 70 - 99 mg/dL Final  .  BUN 08/07/2014 17  6 - 23 mg/dL Final  . Creatinine, Ser 08/07/2014 0.68  0.50 - 1.10 mg/dL Final  . Calcium 08/07/2014 8.7  8.4 - 10.5 mg/dL Final  . GFR calc non Af Amer 08/07/2014 79* >90 mL/min Final  . GFR calc Af Amer 08/07/2014 >90  >90 mL/min Final   Comment: (NOTE)                          The eGFR has been calculated using the CKD EPI equation.                          This calculation has not been validated in all clinical situations.                          eGFR's persistently <90 mL/min signify possible Chronic Kidney                          Disease.  . Anion gap 08/07/2014 12  5 - 15 Final  . WBC 08/08/2014 13.7* 4.0 - 10.5 K/uL Final  . RBC 08/08/2014 3.32* 3.87 - 5.11 MIL/uL Final  . Hemoglobin 08/08/2014 9.8* 12.0 - 15.0 g/dL Final  . HCT 08/08/2014 28.9* 36.0 - 46.0 % Final  . MCV 08/08/2014  87.0  78.0 - 100.0 fL Final  . MCH 08/08/2014 29.5  26.0 - 34.0 pg Final  . MCHC 08/08/2014 33.9  30.0 - 36.0 g/dL Final  . RDW 08/08/2014 14.1  11.5 - 15.5 % Final  . Platelets 08/08/2014 183  150 - 400 K/uL Final  . Sodium 08/08/2014 143  137 - 147 mEq/L Final  . Potassium 08/08/2014 3.8  3.7 - 5.3 mEq/L Final  . Chloride 08/08/2014 102  96 - 112 mEq/L Final  . CO2 08/08/2014 30  19 - 32 mEq/L Final  . Glucose, Bld 08/08/2014 118* 70 - 99 mg/dL Final  . BUN 08/08/2014 16  6 - 23 mg/dL Final  . Creatinine, Ser 08/08/2014 0.73  0.50 - 1.10 mg/dL Final  . Calcium 08/08/2014 9.5  8.4 - 10.5 mg/dL Final  . GFR calc non Af Amer 08/08/2014 77* >90 mL/min Final  . GFR calc Af Amer 08/08/2014 90* >90 mL/min Final   Comment: (NOTE)                          The eGFR has been calculated using the CKD EPI equation.                          This calculation has not been validated in all clinical situations.                          eGFR's persistently <90 mL/min signify possible Chronic Kidney                          Disease.  [name] gap 08/08/2014 11  5 - 15 Final  Hospital Outpatient Visit on 07/30/2014  Component Date Value Ref Range Status  . MRSA, PCR 07/30/2014 NEGATIVE  NEGATIVE Final  . Staphylococcus aureus 07/30/2014 POSITIVE* NEGATIVE Final   Comment:  The Xpert SA Assay (FDA                          approved for NASAL specimens                          in patients over 63 years of age),                          is one component of                          a comprehensive surveillance                          program.  Test performance has                          been validated by [employer] for patients greater                          than or equal to 74 year old.                          It is not intended                          to diagnose infection nor to                          guide or monitor treatment.  Marland Kitchen  aPTT 07/30/2014 30  24 - 37 seconds Final  . WBC 07/30/2014 8.0  4.0 - 10.5 K/uL Final  . RBC 07/30/2014 4.52  3.87 - 5.11 MIL/uL Final  . Hemoglobin 07/30/2014 13.2  12.0 - 15.0 g/dL Final  . HCT 07/30/2014 39.3  36.0 - 46.0 % Final  . MCV 07/30/2014 86.9  78.0 - 100.0 fL Final  . MCH 07/30/2014 29.2  26.0 - 34.0 pg Final  . MCHC 07/30/2014 33.6  30.0 - 36.0 g/dL Final  . RDW 07/30/2014 13.8  11.5 - 15.5 % Final  . Platelets 07/30/2014 227  150 - 400 K/uL Final  . Sodium 07/30/2014 137  137 - 147 mEq/L Final  . Potassium 07/30/2014 3.8  3.7 - 5.3 mEq/L Final  . Chloride 07/30/2014 99  96 - 112 mEq/L Final  . CO2 07/30/2014 25  19 - 32 mEq/L Final  . Glucose, Bld 07/30/2014 101* 70 - 99 mg/dL Final  . BUN 07/30/2014 15  6 - 23 mg/dL Final  . Creatinine, Ser 07/30/2014 0.68  0.50 - 1.10 mg/dL Final  . Calcium 07/30/2014 10.4  8.4 - 10.5 mg/dL Final  . Total Protein 07/30/2014 6.8  6.0 - 8.3 g/dL Final  . Albumin 07/30/2014 3.9  3.5 - 5.2 g/dL Final  . AST 07/30/2014 20  0 - 37 U/L Final  . ALT 07/30/2014 15  0 - 35 U/L Final  . Alkaline Phosphatase 07/30/2014 58  39 - 117 U/L Final  . Total Bilirubin 07/30/2014 0.5  0.3 - 1.2 mg/dL Final  .  GFR calc non Af Amer 07/30/2014 79* >90 mL/min Final  . GFR calc Af Amer 07/30/2014 >90  >90 mL/min Final   Comment: (NOTE)                          The eGFR has been calculated using the CKD EPI equation.                          This calculation has not been validated in all clinical situations.                          eGFR's persistently <90 mL/min signify possible Chronic Kidney                          Disease.  . Anion gap 07/30/2014 13  5 - 15 Final  . Prothrombin Time 07/30/2014 12.9  11.6 - 15.2 seconds Final  . INR 07/30/2014 0.97  0.00 - 1.49 Final  . Color, Urine 07/30/2014 AMBER* YELLOW Final   BIOCHEMICALS MAY BE AFFECTED BY COLOR  . APPearance 07/30/2014 CLOUDY* CLEAR Final  . Specific Gravity, Urine 07/30/2014 1.023  1.005 -  1.030 Final  . pH 07/30/2014 6.0  5.0 - 8.0 Final  . Glucose, UA 07/30/2014 NEGATIVE  NEGATIVE mg/dL Final  . Hgb urine dipstick 07/30/2014 NEGATIVE  NEGATIVE Final  . Bilirubin Urine 07/30/2014 NEGATIVE  NEGATIVE Final  . Ketones, ur 07/30/2014 NEGATIVE  NEGATIVE mg/dL Final  . Protein, ur 07/30/2014 NEGATIVE  NEGATIVE mg/dL Final  . Urobilinogen, UA 07/30/2014 0.2  0.0 - 1.0 mg/dL Final  . Nitrite 07/30/2014 NEGATIVE  NEGATIVE Final  . Leukocytes, UA 07/30/2014 MODERATE* NEGATIVE Final  . ABO/RH(D) 07/30/2014 O NEG   Final  . Antibody Screen 07/30/2014 POS   Final  . Sample Expiration 07/30/2014 08/05/2014   Final  . Antibody Identification 22/48/2500 ANTI D ANTI C   Final  . DAT, IgG 07/30/2014 NEG   Final  . Squamous Epithelial / LPF 07/30/2014 MANY* RARE Final  . WBC, UA 07/30/2014 11-20  <3 WBC/hpf Final  . RBC / HPF 07/30/2014 0-2  <3 RBC/hpf Final  . Bacteria, UA 07/30/2014 MANY* RARE Final  . Casts 07/30/2014 HYALINE CASTS* NEGATIVE Final  . Urine-Other 07/30/2014 MUCOUS PRESENT   Final     X-Rays:Dg Chest 2 View  07/30/2014   CLINICAL DATA:  Preoperative for right hip replacement; history of hypertension  EXAM: CHEST  2 VIEW  COMPARISON:  PA and lateral chest x-ray of August 09, 2011  FINDINGS: The lungs are adequately inflated. There is no focal infiltrate. There are stable coarse infrahilar lung markings on the left. The heart and pulmonary vascularity are normal. There is no pleural effusion. There is mild loss of height of the body of approximately of approximately T9. This amounts to 15-20% anteriorly.  IMPRESSION: No active cardiopulmonary disease.   Electronically Signed   By: David  Martinique   On: 07/30/2014 12:48   Dg Hip Complete Right  07/30/2014   CLINICAL DATA:  Preoperative for right hip replacement  EXAM: RIGHT HIP - COMPLETE 2+ VIEW  COMPARISON:  None.  FINDINGS: The bones are mildly osteopenic. There is severe degenerative change of both hips. There is no acute  or healing fracture. The lower lumbar spine exhibits mild degenerative change. The observed portions of the sacrum and SI joints  are normal.  IMPRESSION: There is severe degenerative change of both hips.   Electronically Signed   By: David  Martinique   On: 07/30/2014 12:49   Dg Pelvis Portable  08/06/2014   CLINICAL DATA:  Postop right hip arthroplasty  EXAM: PORTABLE PELVIS 1-2 VIEWS  COMPARISON:  10/27/2011  FINDINGS: Postoperative changes from a right total hip arthroplasty identified. The hardware components are in anatomic alignment. No periprosthetic fracture or dislocation. Surgical drain overlies the greater trochanter of the right proximal femur moderate to severe left hip osteoarthritis is noted.  IMPRESSION: 1. Status post right hip arthroplasty.  No complications.   Electronically Signed   By: Kerby Moors M.D.   On: 08/06/2014 15:50   Dg C-arm 1-60 Min-no Report  08/06/2014   CLINICAL DATA: right hip anterior   C-ARM 1-60 MINUTES  Fluoroscopy was utilized by the requesting physician.  No radiographic  interpretation.     EKG: Orders placed during the hospital encounter of 07/30/14  . EKG 12-LEAD  . EKG 12-LEAD     Hospital Course: Patient was admitted to [facility] and taken to the OR and underwent the above state procedure without complications.  Patient tolerated the procedure well and was later transferred to the recovery room and then to the orthopaedic floor for postoperative care.  They were given PO and IV analgesics for pain control following their surgery.  They were given 24 hours of postoperative antibiotics of  Anti-infectives   Start     Dose/Rate Route Frequency Ordered Stop   08/06/14 2000  ceFAZolin (ANCEF) IVPB 2 g/50 mL premix     2 g 100 mL/hr over 30 Minutes Intravenous Every 6 hours 08/06/14 1702 08/07/14 0206   08/06/14 1041  ceFAZolin (ANCEF) IVPB 2 g/50 mL premix     2 g 100 mL/hr over 30 Minutes Intravenous On call to O.R. 08/06/14 1041 08/06/14  1328     and started on DVT prophylaxis in the form of Xarelto.   PT and OT were ordered for total hip protocol.  The patient was allowed to be WBAT with therapy. Discharge planning was consulted to help with postop disposition and equipment needs.  Patient had a good night on the evening of surgery.  They started to get up OOB with therapy on day one.  Hemovac drain was pulled without difficulty.  Continued to work with therapy into day two.  Dressing was changed on day two and the incision looked excellent.  The Dermabond Prineo dressing was in place over the right hip incision. It is a mesh wound closure system. Do not remove the mesh dressing and Dermabond glue. It will stay in place for two weeks. Patient was seen in rounds and was ready to go to [facility].  [place]gett is a unique two-part system that consists of a liquid adhesive and a polyester mesh.  The liquid adhesive contains a highly purified 2-octylcyanoacrylate monomer, which, after polymerization, is stronger, more flexible, and less brittle 211 Furthermore, the adhesive's formulation includes proprietary additives to enhance strength, flexibility, and adherence to the skin1  The polyester mesh facilitates wound edge approximation and contains a chemical initiator to ensure consistent, reliable polymerization times1 DERMABOND PRINEO System was shown to provide statistically significant greater skin holding strength than skin staples or subcuticular suture.*  Protective:  Provides a flexible microbial barrier with 99% protection in vitro for 72 hours against organisms commonly responsible for surgical site infections (  SSIs)?  [place] demonstrated in vitro inhibition of gram positive (MRSA and MRSE) and gram negative bacteria (E. Coli)=  DO NOT REMOVE THE MESH PRINEO WOUND CLOSURE DRESSING. IT IS TO REMAIN IN PLACE FOR TWO WEEKS UNTIL THE PATIENT IS SEEN IN THE OFFICE  FOR FOLLOW UP WITH DR. Wynelle Link.  May cover the mesh dressing with 4x4 gauze dressings in order to keep the patient's underwear from rubbing against the mesh dressing.  The patient may shower and allow the wound covering to get wet but do not submerge the dressing under water.  Discharge to SNF  Diet - Cardiac diet  Follow up - in 2 weeks on Tuesday Sept. 15th.  Activity - WBAT  Disposition - Skilled nursing facility  Condition Upon Discharge - Good  D/C Meds - See DC Summary  DVT Prophylaxis - Xarelto   Discharge Instructions   Call MD / Call 911    Complete by:  As directed   If you experience chest pain or shortness of breath, CALL 911 and be transported to the hospital emergency room.  If you develope a fever above 101 F, pus (white drainage) or increased drainage or redness at the wound, or calf pain, call your surgeon's office.     Constipation Prevention    Complete by:  As directed   Drink plenty of fluids.  Prune juice may be helpful.  You may use a stool softener, such as Colace (over the counter) 100 mg twice a day.  Use MiraLax (over the counter) for constipation as needed.     Diet - low sodium heart healthy    Complete by:  As directed      Discharge instructions    Complete by:  As directed   Pick up stool softner and laxative for home. Do not submerge incision under water. May shower. Continue to use ice for pain and swelling from surgery.  Total Hip Protocol.  Take Xarelto for two and a half more weeks, then discontinue Xarelto. Once the patient has completed the Xarelto, they may resume the 81 mg Aspirin.  When discharged from the skilled rehab facility, please have the facility set up the patient's Strong prior to being released.  Also provide the patient with their medications at time of release from the facility to include their pain medication, the muscle relaxants, and their blood thinner medication.  If the patient is still at the rehab  facility at time of follow up appointment, please also assist the patient in arranging follow up appointment in our office and any transportation needs.     Do not sit on low chairs, stoools or toilet seats, as it may be difficult to get up from low surfaces    Complete by:  As directed      Driving restrictions    Complete by:  As directed   No driving until released by the physician.     Increase activity slowly as tolerated    Complete by:  As directed      Lifting restrictions    Complete by:  As directed   No lifting until released by the physician.     Patient may shower    Complete by:  As directed   You may shower without a dressing once there is no drainage.  Do not wash over the wound.  If drainage remains, do not shower until drainage stops.     TED hose    Complete by:  As directed   Use stockings (TED hose) for 3 weeks on both leg(s).  You may remove them at night for sleeping.     Weight bearing as tolerated    Complete by:  As directed             Medication List    STOP taking these medications       aspirin 81 MG tablet     estradiol 0.05 mg/24hr patch  Commonly known as:  CLIMARA - Dosed in mg/24 hr     HYDROcodone-acetaminophen 5-325 MG per tablet  Commonly known as:  NORCO/VICODIN     multivitamin capsule     TRIPLE FLEX Caps     Vitamin D 2000 UNITS tablet      TAKE these medications       acetaminophen 325 MG tablet  Commonly known as:  TYLENOL  Take 2 tablets (650 mg total) by mouth every 6 (six) hours as needed for mild pain or headache (or Fever >/= 101).     bisacodyl 10 MG suppository  Commonly known as:  DULCOLAX  Place 1 suppository (10 mg total) rectally daily as needed for moderate constipation.     fenofibrate 48 MG tablet  Commonly known as:  TRICOR  Take 48 mg by mouth daily.     hydrochlorothiazide 25 MG tablet  Commonly known as:  HYDRODIURIL  Take 25 mg by mouth daily.     lisinopril 30 MG tablet  Commonly known as:   PRINIVIL,ZESTRIL  Take 30 mg by mouth Daily.     LUMIGAN 0.01 % Soln  Generic drug:  bimatoprost  Place 1 drop into both eyes at bedtime.     LYRICA 50 MG capsule  Generic drug:  pregabalin  Take 50 mg by mouth daily.     methocarbamol 500 MG tablet  Commonly known as:  ROBAXIN  Take 1 tablet (500 mg total) by mouth every 6 (six) hours as needed for muscle spasms.     metoCLOPramide 5 MG tablet  Commonly known as:  REGLAN  Take 1-2 tablets (5-10 mg total) by mouth every 8 (eight) hours as needed for nausea (if ondansetron (ZOFRAN) ineffective.).     omeprazole 40 MG capsule  Commonly known as:  PRILOSEC  Take 40 mg by mouth daily.     ondansetron 4 MG tablet  Commonly known as:  ZOFRAN  Take 1 tablet (4 mg total) by mouth every 6 (six) hours as needed for nausea.     oxyCODONE 5 MG immediate release tablet  Commonly known as:  Oxy IR/ROXICODONE  Take 1-2 tablets (5-10 mg total) by mouth every 3 (three) hours as needed for moderate pain, severe pain or breakthrough pain.     polyethylene glycol packet  Commonly known as:  MIRALAX / GLYCOLAX  Take 17 g by mouth daily as needed for mild constipation.     rivaroxaban 10 MG Tabs tablet  Commonly known as:  XARELTO  - Take 1 tablet (10 mg total) by mouth daily with breakfast. Take Xarelto for two and a half more weeks, then discontinue Xarelto.  - Once the patient has completed the Xarelto, they may resume the 81 mg Aspirin.     sertraline 50 MG tablet  Commonly known as:  ZOLOFT  Take 50 mg by mouth daily.     simvastatin 20 MG tablet  Commonly known as:  ZOCOR  Take 20 mg by mouth at bedtime.     STOOL SOFTENER PO  Take 100 mg by mouth 2 (two) times daily.     traMADol 50 MG tablet  Commonly known as:  ULTRAM  Take 1-2 tablets (50-100 mg total) by mouth every 6 (six) hours as needed (mild to moderate pain).     traZODone 100 MG tablet  Commonly known as:  DESYREL  Take 200 mg by mouth at bedtime.             Follow-up Information   Follow up with Gearlean Alf, MD. Schedule an appointment as soon as possible for a visit on 08/19/2014. (Call [phone] Monday to make the appointment)    Specialty:  Orthopedic Surgery   Contact information:   [address] [place] [address] [phone]       Signed: Arlee Muslim, PA-C Orthopaedic Surgery 08/08/2014, 9:34 AM

## 2014-08-10 LAB — TYPE AND SCREEN
ABO/RH(D): O NEG
Antibody Screen: POSITIVE
DAT, IGG: NEGATIVE
DONOR AG TYPE: NEGATIVE
Donor AG Type: NEGATIVE
Unit division: 0
Unit division: 0

## 2014-08-12 ENCOUNTER — Non-Acute Institutional Stay (SKILLED_NURSING_FACILITY): Payer: Medicare Other | Admitting: Internal Medicine

## 2014-08-12 DIAGNOSIS — D62 Acute posthemorrhagic anemia: Secondary | ICD-10-CM

## 2014-08-12 DIAGNOSIS — G4700 Insomnia, unspecified: Secondary | ICD-10-CM | POA: Insufficient documentation

## 2014-08-12 DIAGNOSIS — M1610 Unilateral primary osteoarthritis, unspecified hip: Secondary | ICD-10-CM

## 2014-08-12 DIAGNOSIS — M1611 Unilateral primary osteoarthritis, right hip: Secondary | ICD-10-CM

## 2014-08-12 DIAGNOSIS — I10 Essential (primary) hypertension: Secondary | ICD-10-CM

## 2014-08-12 NOTE — Progress Notes (Signed)
HISTORY & PHYSICAL  DATE: 08/12/2014   FACILITY: [facility]  LEVEL OF CARE: SNF (31)  ALLERGIES:  Allergies  Allergen Reactions  . Procaine     Novocaine caused rash and face blisters.  . Epinephrine     Rapid heart beat    CHIEF COMPLAINT:  Manage right hip osteoarthritis, hypertension and insomnia  HISTORY OF PRESENT ILLNESS: Patient is an 80 year old Caucasian female.  HIP OSTEOARTHRITIS: patient had advanced end stage OA of the hip with progressively worsening pain & dysfunction.  Pt failed non-surgical conservative management.  Therefore pt underwent total hip arthroplasty & tolerated the procedure well.  Pt denies hip pain currently.  Pt was admitted to this facility for short term rehabilitation.  HTN: Pt 's HTN remains stable.  Denies CP, sob, DOE, pedal edema, headaches, dizziness or visual disturbances.  No complications from the medications currently being used.  Last BP : 93/52  INSOMNIA: The insomnia remains stable.  No complications noted from the medications presently being used. Patient denies ongoing insomnia, pain, hallucinations, delusions.  PAST MEDICAL HISTORY :  Past Medical History  Diagnosis Date  . Arthritis   . Fibromyalgia   . Skin cancer   . SUI (stress urinary incontinence, female)   . GERD (gastroesophageal reflux disease)   . HLD (hyperlipidemia)   . Osteoarthritis   . Depression   . Peripheral neuropathy   . HTN (hypertension)   . Ulcerative colitis   . Anemia   . Internal hemorrhoids   . Complication of anesthesia     "I GOT PNEUMONIA THE NEXT DAY FROM THE ANESTHESIA"  . Shortness of breath   . History of skin cancer     PAST SURGICAL HISTORY: Past Surgical History  Procedure Laterality Date  . Breast excisional biopsy Left 08/10/2011     Dr Margot Chimes  . Breast surgery    . Carpal tunnel release Left   . Cesarean section    . Cholecystectomy    . Abdominal hysterectomy    . Colonoscopy  04/29/2003     internal hemorrhoids  . Appendectomy    . Joint replacement Bilateral 2010 and 2012    KNEES  . Total hip arthroplasty Right 08/06/2014    Procedure: RIGHT TOTAL HIP ARTHROPLASTY ANTERIOR APPROACH;  Surgeon: Gearlean Alf, MD;  Location: [facility];  Service: Orthopedics;  Laterality: Right;    SOCIAL HISTORY:  reports that she has never smoked. She has never used smokeless tobacco. She reports that she does not drink alcohol or use illicit drugs.  FAMILY HISTORY:  Family History  Problem Relation Age of Onset  . Colon cancer Mother 31  . Diabetes Brother     CURRENT MEDICATIONS: Reviewed per MAR/see medication list  REVIEW OF SYSTEMS:  See HPI otherwise 14 point ROS is negative.  PHYSICAL EXAMINATION  VS:  See VS section  GENERAL: no acute distress, normal body habitus EYES: conjunctivae normal, sclerae normal, normal eye lids MOUTH/THROAT: lips without lesions,no lesions in the mouth,tongue is without lesions,uvula elevates in midline NECK: supple, trachea midline, no neck masses, no thyroid tenderness, no thyromegaly LYMPHATICS: no LAN in the neck, no supraclavicular LAN RESPIRATORY: breathing is even & unlabored, BS CTAB CARDIAC: RRR, no murmur,no extra heart sounds, no edema GI:  ABDOMEN: abdomen soft, normal BS, no masses, no tenderness  LIVER/SPLEEN: no hepatomegaly, no splenomegaly MUSCULOSKELETAL: HEAD: normal to inspection  EXTREMITIES: LEFT UPPER EXTREMITY: full range of motion, normal strength &  tone RIGHT UPPER EXTREMITY:  full range of motion, normal strength & tone LEFT LOWER EXTREMITY: Moderate range of motion, normal strength & tone RIGHT LOWER EXTREMITY:  range of motion not tested due to surgery, normal strength & tone PSYCHIATRIC: the patient is alert & oriented to person, affect & behavior appropriate  LABS/RADIOLOGY:  Labs reviewed: Basic Metabolic Panel:  Recent Labs  07/30/14 1115 08/07/14 0450 08/08/14 0511  NA 137 137 143  K 3.8 3.7  3.8  CL 99 100 102  CO2 25 25 30   GLUCOSE 101* 150* 118*  BUN 15 17 16   CREATININE 0.68 0.68 0.73  CALCIUM 10.4 8.7 9.5   Liver Function Tests:  Recent Labs  07/30/14 1115  AST 20  ALT 15  ALKPHOS 58  BILITOT 0.5  PROT 6.8  ALBUMIN 3.9   CBC:  Recent Labs  07/30/14 1115 08/07/14 0450 08/08/14 0511  WBC 8.0 16.0* 13.7*  HGB 13.2 10.7* 9.8*  HCT 39.3 31.5* 28.9*  MCV 86.9 87.3 87.0  PLT 227 211 183    CHEST  2 VIEW   COMPARISON:  PA and lateral chest x-ray of August 09, 2011   FINDINGS: The lungs are adequately inflated. There is no focal infiltrate. There are stable coarse infrahilar lung markings on the left. The heart and pulmonary vascularity are normal. There is no pleural effusion. There is mild loss of height of the body of approximately of approximately T9. This amounts to 15-20% anteriorly.   IMPRESSION: No active cardiopulmonary disease.   RIGHT HIP - COMPLETE 2+ VIEW   COMPARISON:  None.   FINDINGS: The bones are mildly osteopenic. There is severe degenerative change of both hips. There is no acute or healing fracture. The lower lumbar spine exhibits mild degenerative change. The observed portions of the sacrum and SI joints are normal.   IMPRESSION: There is severe degenerative change of both hips.   PORTABLE PELVIS 1-2 VIEWS   COMPARISON:  10/27/2011   FINDINGS: Postoperative changes from a right total hip arthroplasty identified. The hardware components are in anatomic alignment. No periprosthetic fracture or dislocation. Surgical drain overlies the greater trochanter of the right proximal femur moderate to severe left hip osteoarthritis is noted.   IMPRESSION: 1. Status post right hip arthroplasty.  No complications   ASSESSMENT/PLAN:  Right hip osteoarthritis-status post total hip arthroplasty. Continue rehabilitation. Hypertension-well-controlled Insomnia-continue trazodone Acute blood loss anemia-check  hemoglobin GERD-continue Prilosec neuropathy-continue Lyrica Check CBC with differential  I have reviewed patient's medical records received at admission/from hospitalization.  CPT CODE: 00867  Sharolyn Weber Y Rito Lecomte, [place] [phone]

## 2014-08-13 ENCOUNTER — Non-Acute Institutional Stay (SKILLED_NURSING_FACILITY): Payer: Medicare Other | Admitting: Adult Health

## 2014-08-13 ENCOUNTER — Encounter: Payer: Self-pay | Admitting: Adult Health

## 2014-08-13 DIAGNOSIS — K219 Gastro-esophageal reflux disease without esophagitis: Secondary | ICD-10-CM

## 2014-08-13 DIAGNOSIS — F3289 Other specified depressive episodes: Secondary | ICD-10-CM

## 2014-08-13 DIAGNOSIS — K5900 Constipation, unspecified: Secondary | ICD-10-CM

## 2014-08-13 DIAGNOSIS — F329 Major depressive disorder, single episode, unspecified: Secondary | ICD-10-CM

## 2014-08-13 DIAGNOSIS — M797 Fibromyalgia: Secondary | ICD-10-CM

## 2014-08-13 DIAGNOSIS — E785 Hyperlipidemia, unspecified: Secondary | ICD-10-CM

## 2014-08-13 DIAGNOSIS — IMO0001 Reserved for inherently not codable concepts without codable children: Secondary | ICD-10-CM

## 2014-08-13 DIAGNOSIS — I10 Essential (primary) hypertension: Secondary | ICD-10-CM

## 2014-08-13 DIAGNOSIS — M1611 Unilateral primary osteoarthritis, right hip: Secondary | ICD-10-CM

## 2014-08-13 DIAGNOSIS — M1610 Unilateral primary osteoarthritis, unspecified hip: Secondary | ICD-10-CM

## 2014-08-13 DIAGNOSIS — G4700 Insomnia, unspecified: Secondary | ICD-10-CM

## 2014-08-13 NOTE — Progress Notes (Addendum)
Patient ID: [name], female   DOB: 1930-07-19, 81 y.o.   MRN: [id number]              PROGRESS NOTE  DATE: 08/13/2014   FACILITY: [facility]  LEVEL OF CARE: SNF (31)  Acute Visit  CHIEF COMPLAINT:  Discharge Notes  HISTORY OF PRESENT ILLNESS: This is an 80 year old female who is for discharge home with Home health PT and OT.  She has been admitted to [facility] on 08/08/14 from [facility] with Osteoarthritis S/P Right total hip arthroplasty. Patient was admitted to this facility for short-term rehabilitation after the patient's recent hospitalization.  Patient has completed SNF rehabilitation and therapy has cleared the patient for discharge.  Reassessment of ongoing problem(s):  HTN: Pt 's HTN remains stable.  Denies CP, sob, DOE, pedal edema, headaches, dizziness or visual disturbances.  No complications from the medications currently being used.  Last BP : 130/62  GERD: pt's GERD is stable.  Denies ongoing heartburn, abd. Pain, nausea or vomiting.  Currently on a PPI & tolerates it without any adverse reactions.  DEPRESSION: The depression remains stable. Patient denies ongoing feelings of sadness, insomnia, anedhonia or lack of appetite. No complications reported from the medications currently being used. Staff do not report behavioral problems.  PAST MEDICAL HISTORY : Reviewed.  No changes/see problem list  CURRENT MEDICATIONS: Reviewed per MAR/see medication list  REVIEW OF SYSTEMS:  GENERAL: no change in appetite, no fatigue, no weight changes, no fever, chills or weakness RESPIRATORY: no cough, SOB, DOE, wheezing, hemoptysis CARDIAC: no chest pain, edema or palpitations GI: no abdominal pain, diarrhea, heart burn, nausea or vomiting, +constipation  PHYSICAL EXAMINATION  GENERAL: no acute distress, normal body habitus EYES: conjunctivae normal, sclerae normal, normal eye lids NECK: supple, trachea midline, no neck masses, no thyroid  tenderness, no thyromegaly LYMPHATICS: no LAN in the neck, no supraclavicular LAN RESPIRATORY: breathing is even & unlabored, BS CTAB CARDIAC: RRR, no murmur,no extra heart sounds, no edema GI: abdomen soft, normal BS, no masses, no tenderness, no hepatomegaly, no splenomegaly EXTREMITIES:  Able to move all 4 extremities PSYCHIATRIC: the patient is alert & oriented to person, affect & behavior appropriate  LABS/RADIOLOGY: Labs reviewed: Basic Metabolic Panel:  Recent Labs  07/30/14 1115 08/07/14 0450 08/08/14 0511  NA 137 137 143  K 3.8 3.7 3.8  CL 99 100 102  CO2 25 25 30   GLUCOSE 101* 150* 118*  BUN 15 17 16   CREATININE 0.68 0.68 0.73  CALCIUM 10.4 8.7 9.5   Liver Function Tests:  Recent Labs  07/30/14 1115  AST 20  ALT 15  ALKPHOS 58  BILITOT 0.5  PROT 6.8  ALBUMIN 3.9   CBC:  Recent Labs  07/30/14 1115 08/07/14 0450 08/08/14 0511  WBC 8.0 16.0* 13.7*  HGB 13.2 10.7* 9.8*  HCT 39.3 31.5* 28.9*  MCV 86.9 87.3 87.0  PLT 227 211 183    EXAM: CHEST  2 VIEW   COMPARISON:  PA and lateral chest x-ray of August 09, 2011   FINDINGS: The lungs are adequately inflated. There is no focal infiltrate. There are stable coarse infrahilar lung markings on the left. The heart and pulmonary vascularity are normal. There is no pleural effusion. There is mild loss of height of the body of approximately of approximately T9. This amounts to 15-20% anteriorly.   IMPRESSION: No active cardiopulmonary disease. EXAM: RIGHT HIP - COMPLETE 2+ VIEW   COMPARISON:  None.  FINDINGS: The bones are mildly osteopenic. There is severe degenerative change of both hips. There is no acute or healing fracture. The lower lumbar spine exhibits mild degenerative change. The observed portions of the sacrum and SI joints are normal.   IMPRESSION: There is severe degenerative change of both hips. EXAM: PORTABLE PELVIS 1-2 VIEWS   COMPARISON:  10/27/2011    FINDINGS: Postoperative changes from a right total hip arthroplasty identified. The hardware components are in anatomic alignment. No periprosthetic fracture or dislocation. Surgical drain overlies the greater trochanter of the right proximal femur moderate to severe left hip osteoarthritis is noted.   IMPRESSION: 1. Status post right hip arthroplasty.  No complications.   ASSESSMENT/PLAN:  Osteoarthritis status post right total hip arthroplasty - for Home health PT and OT Hypertension - well controlled; continue lisinopril and HCTZ Hyperlipidemia - continue fenofibrate and simvastatin Fibromyalgia - stable; continue Lyrica GERD - continue Prilosec Depression - stable; continue Zoloft Insomnia - continue trazodone Constipation - increase MiraLax to 17 g last 6-8 ounces of liquid by mouth twice a day and magnesia citrate 296 mL by mouth x1  I have filled out patient's discharge paperwork and written prescriptions. Patient will have Home health PT and OT.   Total discharge time: Greater than 30 minutes  Discharge time involved coordination of the discharge process with social worker, nursing staff and therapy department. Medical justification of Home health services verified.  CPT CODE: 59741  Seth Bake - NP [facility] [phone]

## 2014-11-13 ENCOUNTER — Ambulatory Visit: Payer: Self-pay | Admitting: Orthopedic Surgery

## 2014-11-13 NOTE — Progress Notes (Signed)
Preoperative surgical orders have been place into the Epic hospital system for [name] on 11/13/2014, 6:25 PM  by Mickel Crow for surgery on 12-17-2014.  Preop Total Hip - Anterior Approach orders including Experel Injecion, IV Tylenol, and IV Decadron as long as there are no contraindications to the above medications. Arlee Muslim, PA-C

## 2014-12-11 ENCOUNTER — Encounter (HOSPITAL_COMMUNITY)
Admission: RE | Admit: 2014-12-11 | Discharge: 2014-12-11 | Disposition: A | Discharge status: Home / Self Care | Payer: Medicare Other | Source: Ambulatory Visit | Attending: Orthopedic Surgery | Admitting: Orthopedic Surgery

## 2014-12-11 ENCOUNTER — Encounter (HOSPITAL_COMMUNITY): Payer: Self-pay

## 2014-12-11 ENCOUNTER — Ambulatory Visit (HOSPITAL_COMMUNITY)
Admission: RE | Admit: 2014-12-11 | Discharge: 2014-12-11 | Disposition: A | Discharge status: Home / Self Care | Payer: Medicare Other | Source: Ambulatory Visit | Attending: Orthopedic Surgery | Admitting: Orthopedic Surgery

## 2014-12-11 DIAGNOSIS — M1612 Unilateral primary osteoarthritis, left hip: Secondary | ICD-10-CM

## 2014-12-11 DIAGNOSIS — M5136 Other intervertebral disc degeneration, lumbar region: Secondary | ICD-10-CM | POA: Diagnosis not present

## 2014-12-11 DIAGNOSIS — Z7982 Long term (current) use of aspirin: Secondary | ICD-10-CM | POA: Insufficient documentation

## 2014-12-11 DIAGNOSIS — Z01818 Encounter for other preprocedural examination: Secondary | ICD-10-CM | POA: Diagnosis not present

## 2014-12-11 DIAGNOSIS — Z96641 Presence of right artificial hip joint: Secondary | ICD-10-CM | POA: Diagnosis not present

## 2014-12-11 LAB — URINALYSIS, ROUTINE W REFLEX MICROSCOPIC
BILIRUBIN URINE: NEGATIVE
Glucose, UA: NEGATIVE mg/dL
Hgb urine dipstick: NEGATIVE
Ketones, ur: NEGATIVE mg/dL
NITRITE: NEGATIVE
PH: 7 (ref 5.0–8.0)
Protein, ur: NEGATIVE mg/dL
SPECIFIC GRAVITY, URINE: 1.019 (ref 1.005–1.030)
Urobilinogen, UA: 0.2 mg/dL (ref 0.0–1.0)

## 2014-12-11 LAB — URINE MICROSCOPIC-ADD ON

## 2014-12-11 LAB — CBC
HCT: 39.2 % (ref 36.0–46.0)
HEMOGLOBIN: 12.9 g/dL (ref 12.0–15.0)
MCH: 29 pg (ref 26.0–34.0)
MCHC: 32.9 g/dL (ref 30.0–36.0)
MCV: 88.1 fL (ref 78.0–100.0)
PLATELETS: 234 10*3/uL (ref 150–400)
RBC: 4.45 MIL/uL (ref 3.87–5.11)
RDW: 15.2 % (ref 11.5–15.5)
WBC: 11.6 10*3/uL — AB (ref 4.0–10.5)

## 2014-12-11 LAB — COMPREHENSIVE METABOLIC PANEL
ALBUMIN: 4.5 g/dL (ref 3.5–5.2)
ALT: 19 U/L (ref 0–35)
ANION GAP: 8 (ref 5–15)
AST: 25 U/L (ref 0–37)
Alkaline Phosphatase: 62 U/L (ref 39–117)
BUN: 23 mg/dL (ref 6–23)
CALCIUM: 10 mg/dL (ref 8.4–10.5)
CO2: 28 mmol/L (ref 19–32)
CREATININE: 0.73 mg/dL (ref 0.50–1.10)
Chloride: 102 mEq/L (ref 96–112)
GFR calc Af Amer: 90 mL/min — ABNORMAL LOW (ref >90)
GFR calc non Af Amer: 77 mL/min — ABNORMAL LOW (ref >90)
Glucose, Bld: 88 mg/dL (ref 70–99)
Potassium: 3.9 mmol/L (ref 3.5–5.1)
Sodium: 138 mmol/L (ref 135–145)
Total Bilirubin: 0.8 mg/dL (ref 0.3–1.2)
Total Protein: 6.8 g/dL (ref 6.0–8.3)

## 2014-12-11 LAB — SURGICAL PCR SCREEN
MRSA, PCR: NEGATIVE
STAPHYLOCOCCUS AUREUS: NEGATIVE

## 2014-12-11 LAB — APTT: aPTT: 27 seconds (ref 24–37)

## 2014-12-11 LAB — PROTIME-INR
INR: 0.92 (ref 0.00–1.49)
Prothrombin Time: 12.5 seconds (ref 11.6–15.2)

## 2014-12-11 NOTE — Patient Instructions (Addendum)
20 [name]  12/11/2014   Your procedure is scheduled on:   1-13 -2016 Wednesday  Enter through [facility]  Entrance and follow signs to [facility]. Arrive at     1000   AM .  Call this number if you have problems the morning of surgery: [phone]  Or Presurgical Testing [phone].   For Living Will and/or Health Care Power Attorney Forms: please provide copy for your medical record,may bring AM of surgery(Forms should be already notarized -we do not provide this service).(12-11-14 Yes, will provide information day of surgery).     Do not eat food/ or drink: After Midnight.  Exception: may have clear liquids:up to 6 Hours before arrival. Nothing after: 0700 AM.  Clear liquids include soda, tea, black coffee, apple or grape juice, broth.  Take these medicines the morning of surgery with A SIP OF WATER: Lyrica. Fenofibrate. Sertraline. Omeprazole. Tramadol. Use/ bring eye drops.   Do not wear jewelry, make-up or nail polish.  Do not wear deodorant, lotions, powders, or perfumes.   Do not shave legs and under arms- 48 hours(2 days) prior to first CHG shower.(Shaving face and neck okay.)  Do not bring valuables to the hospital.(Hospital is not responsible for lost valuables).  Contacts, dentures or removable bridgework, body piercing, hair pins may not be worn into surgery.  Leave suitcase in the car. After surgery it may be brought to your room.  For patients admitted to the hospital, checkout time is 11:00 AM the day of discharge.(Restricted visitors-Any Persons displaying flu-like symptoms or illness).    Patients discharged the day of surgery will not be allowed to drive home. Must have responsible person with you x 24 hours once discharged.  Name and phone number of your driver: Alona Bene -daughter-in-law [phone]      Please read over the following fact sheets that you were given:  CHG(Chlorhexidine Gluconate 4% Surgical Soap) use, MRSA Information, Blood  Transfusion fact sheet, Incentive Spirometry Instruction.  Remember : Type/Screen "Blue armbands" - may not be removed once applied(would result in being retested AM of surgery, if removed).         [place] - Preparing for Surgery Before surgery, you can play an important role.  Because skin is not sterile, your skin needs to be as free of germs as possible.  You can reduce the number of germs on your skin by washing with CHG (chlorahexidine gluconate) soap before surgery.  CHG is an antiseptic cleaner which kills germs and bonds with the skin to continue killing germs even after washing. Please DO NOT use if you have an allergy to CHG or antibacterial soaps.  If your skin becomes reddened/irritated stop using the CHG and inform your nurse when you arrive at Short Stay. Do not shave (including legs and underarms) for at least 48 hours prior to the first CHG shower.  You may shave your face/neck. Please follow these instructions carefully:  1.  Shower with CHG Soap the night before surgery and the  morning of Surgery.  2.  If you choose to wash your hair, wash your hair first as usual with your  normal  shampoo.  3.  After you shampoo, rinse your hair and body thoroughly to remove the  shampoo.                           4.  Use CHG as you would any other  liquid soap.  You can apply chg directly  to the skin and wash                       Gently with a scrungie or clean washcloth.  5.  Apply the CHG Soap to your body ONLY FROM THE NECK DOWN.   Do not use on face/ open                           Wound or open sores. Avoid contact with eyes, ears mouth and genitals (private parts).                       Wash face,  Genitals (private parts) with your normal soap.             6.  Wash thoroughly, paying special attention to the area where your surgery  will be performed.  7.  Thoroughly rinse your body with warm water from the neck down.  8.  DO NOT shower/wash with your normal soap after using and  rinsing off  the CHG Soap.                9.  Pat yourself dry with a clean towel.            10.  Wear clean pajamas.            11.  Place clean sheets on your bed the night of your first shower and do not  sleep with pets. Day of Surgery : Do not apply any lotions/deodorants the morning of surgery.  Please wear clean clothes to the hospital/surgery center.  FAILURE TO FOLLOW THESE INSTRUCTIONS MAY RESULT IN THE CANCELLATION OF YOUR SURGERY PATIENT SIGNATURE_________________________________  NURSE SIGNATURE__________________________________  ________________________________________________________________________   Adam Phenix  An incentive spirometer is a tool that can help keep your lungs clear and active. This tool measures how well you are filling your lungs with each breath. Taking long deep breaths may help reverse or decrease the chance of developing breathing (pulmonary) problems (especially infection) following:  A long period of time when you are unable to move or be active. BEFORE THE PROCEDURE   If the spirometer includes an indicator to show your best effort, your nurse or respiratory therapist will set it to a desired goal.  If possible, sit up straight or lean slightly forward. Try not to slouch.  Hold the incentive spirometer in an upright position. INSTRUCTIONS FOR USE   Sit on the edge of your bed if possible, or sit up as far as you can in bed or on a chair.  Hold the incentive spirometer in an upright position.  Breathe out normally.  Place the mouthpiece in your mouth and seal your lips tightly around it.  Breathe in slowly and as deeply as possible, raising the piston or the ball toward the top of the column.  Hold your breath for 3-5 seconds or for as long as possible. Allow the piston or ball to fall to the bottom of the column.  Remove the mouthpiece from your mouth and breathe out normally.  Rest for a few seconds and repeat Steps 1  through 7 at least 10 times every 1-2 hours when you are awake. Take your time and take a few normal breaths between deep breaths.  The spirometer may include an indicator to show your best effort. Use the indicator as a  goal to work toward during each repetition.  After each set of 10 deep breaths, practice coughing to be sure your lungs are clear. If you have an incision (the cut made at the time of surgery), support your incision when coughing by placing a pillow or rolled up towels firmly against it. Once you are able to get out of bed, walk around indoors and cough well. You may stop using the incentive spirometer when instructed by your caregiver.  RISKS AND COMPLICATIONS  Take your time so you do not get dizzy or light-headed.  If you are in pain, you may need to take or ask for pain medication before doing incentive spirometry. It is harder to take a deep breath if you are having pain. AFTER USE  Rest and breathe slowly and easily.  It can be helpful to keep track of a log of your progress. Your caregiver can provide you with a simple table to help with this. If you are using the spirometer at home, follow these instructions: [place] IF:   You are having difficultly using the spirometer.  You have trouble using the spirometer as often as instructed.  Your pain medication is not giving enough relief while using the spirometer.  You develop fever of 100.5 F (38.1 C) or higher. SEEK IMMEDIATE MEDICAL CARE IF:   You cough up bloody sputum that had not been present before.  You develop fever of 102 F (38.9 C) or greater.  You develop worsening pain at or near the incision site. MAKE SURE YOU:   Understand these instructions.  Will watch your condition.  Will get help right away if you are not doing well or get worse. Document Released: 04/03/2007 Document Revised: 02/13/2012 Document Reviewed: 06/04/2007 ExitCare Patient Information 2014 ExitCare,  [place].   ________________________________________________________________________  WHAT IS A BLOOD TRANSFUSION? Blood Transfusion Information  A transfusion is the replacement of blood or some of its parts. Blood is made up of multiple cells which provide different functions.  Red blood cells carry oxygen and are used for blood loss replacement.  White blood cells fight against infection.  Platelets control bleeding.  Plasma helps clot blood.  Other blood products are available for specialized needs, such as hemophilia or other clotting disorders. BEFORE THE TRANSFUSION  Who gives blood for transfusions?   Healthy volunteers who are fully evaluated to make sure their blood is safe. This is blood bank blood. Transfusion therapy is the safest it has ever been in the practice of medicine. Before blood is taken from a donor, a complete history is taken to make sure that person has no history of diseases nor engages in risky social behavior (examples are intravenous drug use or sexual activity with multiple partners). The donor's travel history is screened to minimize risk of transmitting infections, such as malaria. The donated blood is tested for signs of infectious diseases, such as HIV and hepatitis. The blood is then tested to be sure it is compatible with you in order to minimize the chance of a transfusion reaction. If you or a relative donates blood, this is often done in anticipation of surgery and is not appropriate for emergency situations. It takes many days to process the donated blood. RISKS AND COMPLICATIONS Although transfusion therapy is very safe and saves many lives, the main dangers of transfusion include:   Getting an infectious disease.  Developing a transfusion reaction. This is an allergic reaction to something in the blood you were given. Every precaution  is taken to prevent this. The decision to have a blood transfusion has been considered carefully by your caregiver  before blood is given. Blood is not given unless the benefits outweigh the risks. AFTER THE TRANSFUSION  Right after receiving a blood transfusion, you will usually feel much better and more energetic. This is especially true if your red blood cells have gotten low (anemic). The transfusion raises the level of the red blood cells which carry oxygen, and this usually causes an energy increase.  The nurse administering the transfusion will monitor you carefully for complications. HOME CARE INSTRUCTIONS  No special instructions are needed after a transfusion. You may find your energy is better. Speak with your caregiver about any limitations on activity for underlying diseases you may have. SEEK MEDICAL CARE IF:   Your condition is not improving after your transfusion.  You develop redness or irritation at the intravenous (IV) site. SEEK IMMEDIATE MEDICAL CARE IF:  Any of the following symptoms occur over the next 12 hours:  Shaking chills.  You have a temperature by mouth above 102 F (38.9 C), not controlled by medicine.  Chest, back, or muscle pain.  People around you feel you are not acting correctly or are confused.  Shortness of breath or difficulty breathing.  Dizziness and fainting.  You get a rash or develop hives.  You have a decrease in urine output.  Your urine turns a dark color or changes to pink, red, or brown. Any of the following symptoms occur over the next 10 days:  You have a temperature by mouth above 102 F (38.9 C), not controlled by medicine.  Shortness of breath.  Weakness after normal activity.  The white part of the eye turns yellow (jaundice).  You have a decrease in the amount of urine or are urinating less often.  Your urine turns a dark color or changes to pink, red, or brown. Document Released: 11/18/2000 Document Revised: 02/13/2012 Document Reviewed: 07/07/2008 [facility] Patient Information 2014 [place],  [place].  _______________________________________________________________________

## 2014-12-11 NOTE — Pre-Procedure Instructions (Signed)
12-11-14 EKG, CXR 8'15/ Echo 2013,Clearance note(12-02-14-Dr Jones) with chart.

## 2014-12-14 ENCOUNTER — Ambulatory Visit: Payer: Self-pay | Admitting: Orthopedic Surgery

## 2014-12-14 NOTE — H&P (Signed)
[name] DOB: July 10, 1930 Widowed / Language: [name] / Race: White Female Date of Admission:  12/17/2014 CC:  Left Hip Pain History of Present Illness The patient is a 81 year old female who comes in for a preoperative History and Physical. The patient is scheduled for a left total hip arthroplasty (anterior approach) to be performed by Dr. Dione Plover. Aluisio, MD at [facility] on 12-17-2014. The patient is a 81 year old female who presents with a hip problem. The patient was seen in referral from Dr. Nelva Bush.The patient reported bilateral hip problems including pain symptoms. She has since had the right hip replaced and doing much better from that standpoint. The symptoms began without any known injury. Previous workup for this problem has included hip x-rays. Previous treatment for this problem has included corticosteroid injections. The right is much im[proved but the left hip continues to be a problem and interfere with her activity. The hip has gotten progressively worse over the past several monthat the point where it is hurting her all the time now. It is limiting what she can and cannot do. She is normally extremely active but the hip has been curtailing that. The hip is keeping her up at night. She did well with the right hip and she is now ready to proceed with the left hip replacement.  They have been treated conservatively in the past for the above stated problem and despite conservative measures, they continue to have progressive pain and severe functional limitations and dysfunction. They have failed non-operative management including home exercise, medications, and injections. It is felt that they would benefit from undergoing total joint replacement. Risks and benefits of the procedure have been discussed with the patient and they elect to proceed with surgery. There are no active contraindications to surgery such as ongoing infection or rapidly progressive neurological  disease.  Problem List/Past Medical Osteoarthritis of both hips resulting from hip dysplasia (M16.2) Status post right hip replacement (K16.010) Neuropathy (G62.9) Neuropathy, peripheral (356.9) Foot, joint pain (719.47) Foot swelling (M79.89) Cervical disc degeneration (722.4) (M50.30) Osteoarthritis of left hip, unspecified osteoarthritis type (M16.12) Irritable bowel syndrome Osteoarthritis Peripheral Neuropathy Sleep Apnea High blood pressure Skin Cancer Chronic Pain Fibromyalgia Gastroesophageal Reflux Disease Anxiety Disorder Depression Hammer toe, other, acquired (735.4)05/14/2004 Degenerative Disc Disease  Allergies Novocain *LOCAL ANESTHETICS-Parenteral* facial blistering after dentist visit GOLD SHOTS  Family History Cancer mother Cerebrovascular Accident mother Depression mother Drug / Alcohol Addiction father, brother, grandfather mothers side and grandmother fathers side Diabetes Mellitus brother Kidney disease brother Osteoarthritis mother Hypertension First Degree Relatives. mother, father and brother  Social History Pain Contract no Tobacco use Never smoker. 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  Medication History Simvastatin (20MG  Tablet, Oral) Active. Sertraline HCl (50MG  Tablet, Oral) Active. Lisinopril (30MG  Tablet, Oral) Active. Hydrochlorothiazide (25MG  Tablet, Oral) Active. TraZODone HCl (100MG  Tablet, Oral) Active. Lyrica (50MG  Capsule, Oral) Active. Lumigan (0.01% Solution, Ophthalmic) Active. Vivelle-Dot (0.05MG /24HR Patch TW, Transdermal) Active. Aspirin (325MG  Tablet, 1  (one) Oral) Active. Stool Softener (100MG  Capsule, Oral) Active. Glucosamine HCl (1500MG  Tablet, Oral) Active. Omeprazole (40MG  Capsule DR, Oral) Active. Fenofibrate (48MG  Tablet, Oral) Active. Benadryl Allergy (25MG  Capsule, Oral) Active.  Past Surgical History Colon Polyp Removal - Colonoscopy  Hysterectomy complete (cancerous) Appendectomy Gallbladder Surgery open Cesarean Delivery 2 times Arthroscopy of Shoulder right Spinal Fusion lower back Breast Biopsy left Cataract Surgery bilateral Carpal Tunnel Repair left Total Knee Replacement bilateral Spinal Surgery Tubal Ligation Tonsillectomy   Review of Systems  General Not Present- Chills, Fatigue, Fever, Memory Loss, Night Sweats, Weight Gain and Weight Loss. Skin Not Present- Eczema, Hives, Itching, Lesions and Rash. HEENT Not Present- Dentures, Double Vision, Headache, Hearing Loss, Tinnitus and Visual Loss. Respiratory Not Present- Allergies, Chronic Cough, Coughing up blood, Shortness of breath at rest and Shortness of breath with exertion. Cardiovascular Not Present- Chest Pain, Difficulty Breathing Lying Down, Murmur, Palpitations, Racing/skipping heartbeats and Swelling. Gastrointestinal Not Present- Abdominal Pain, Bloody Stool, Constipation, Diarrhea, Difficulty Swallowing, Heartburn, Jaundice, Loss of appetitie, Nausea and Vomiting. Female Genitourinary Not Present- Blood in Urine, Discharge, Flank Pain, Incontinence, Painful Urination, Urgency, Urinary frequency, Urinary Retention, Urinating at Night and Weak urinary stream. Musculoskeletal Present- Joint Pain. Not Present- Back Pain, Joint Swelling, Morning Stiffness, Muscle Pain, Muscle Weakness and Spasms. Neurological Not Present- Blackout spells, Difficulty with balance, Dizziness, Paralysis, Tremor and Weakness. Psychiatric Not Present- Insomnia. Endocrine Present- Hot flashes.  Vitals  Height: 70in Pulse: 64 (Regular)  BP: 101/56  (Sitting, Right Arm, Standard)   Physical Exam  General Mental Status -Alert, cooperative and good historian. General Appearance-pleasant, Not in acute distress. Orientation-Oriented X3. Build & Nutrition-Well nourished and Well developed.  Head and Neck Head-normocephalic, atraumatic . Neck Global Assessment - supple, no bruit auscultated on the right, no bruit auscultated on the left.  Eye Pupil - Bilateral-Regular and Round. Motion - Bilateral-EOMI.  Chest and Lung Exam Auscultation Breath sounds - clear at anterior chest wall and clear at posterior chest wall. Adventitious sounds - No Adventitious sounds.  Cardiovascular Auscultation Rhythm - Regular rate and rhythm. Heart Sounds - S1 WNL and S2 WNL. Murmurs & Other Heart Sounds - Auscultation of the heart reveals - No Murmurs.  Abdomen Palpation/Percussion Tenderness - Abdomen is non-tender to palpation. Rigidity (guarding) - Abdomen is soft. Auscultation Auscultation of the abdomen reveals - Bowel sounds normal.  Female Genitourinary Note: Not done, not pertinent to present illness   Musculoskeletal Note: On exam, well developed female alert and oriented in no apparent distress.  Left hip flexion to 110, rotation in 20, out 30, and abduction 30.  Assessment & Plan  Osteoarthritis of left hip, unspecified osteoarthritis type (M16.12) Note:Plan is for a Left Total Hip Replacement - anterior approach by Dr. Wynelle Link.  Plan is to go to [facility] for [employer].  PCP - Dr. Sherrine Maples  The patient does not have any contraindications and will receive TXA (tranexamic acid) prior to surgery.  Signed electronically by Joelene Millin, III PA-C

## 2014-12-17 ENCOUNTER — Inpatient Hospital Stay (HOSPITAL_COMMUNITY): Payer: Medicare Other

## 2014-12-17 ENCOUNTER — Inpatient Hospital Stay (HOSPITAL_COMMUNITY): Payer: Medicare Other | Admitting: Anesthesiology

## 2014-12-17 ENCOUNTER — Encounter (HOSPITAL_COMMUNITY): Payer: Self-pay | Admitting: *Deleted

## 2014-12-17 ENCOUNTER — Inpatient Hospital Stay (HOSPITAL_COMMUNITY)
Admission: RE | Admit: 2014-12-17 | Discharge: 2014-12-19 | DRG: 470 | Disposition: A | Discharge status: Skilled Nursing Facility | Payer: Medicare Other | Source: Ambulatory Visit | Attending: Orthopedic Surgery | Admitting: Orthopedic Surgery

## 2014-12-17 ENCOUNTER — Encounter (HOSPITAL_COMMUNITY)
Admission: RE | Disposition: A | Discharge status: Skilled Nursing Facility | Payer: Self-pay | Source: Ambulatory Visit | Attending: Orthopedic Surgery

## 2014-12-17 DIAGNOSIS — M797 Fibromyalgia: Secondary | ICD-10-CM | POA: Diagnosis present

## 2014-12-17 DIAGNOSIS — I10 Essential (primary) hypertension: Secondary | ICD-10-CM | POA: Diagnosis present

## 2014-12-17 DIAGNOSIS — E785 Hyperlipidemia, unspecified: Secondary | ICD-10-CM | POA: Diagnosis not present

## 2014-12-17 DIAGNOSIS — Z7982 Long term (current) use of aspirin: Secondary | ICD-10-CM

## 2014-12-17 DIAGNOSIS — M169 Osteoarthritis of hip, unspecified: Secondary | ICD-10-CM | POA: Diagnosis present

## 2014-12-17 DIAGNOSIS — Z96649 Presence of unspecified artificial hip joint: Secondary | ICD-10-CM

## 2014-12-17 DIAGNOSIS — Z79899 Other long term (current) drug therapy: Secondary | ICD-10-CM | POA: Diagnosis not present

## 2014-12-17 DIAGNOSIS — Z96641 Presence of right artificial hip joint: Secondary | ICD-10-CM | POA: Diagnosis present

## 2014-12-17 DIAGNOSIS — M25552 Pain in left hip: Secondary | ICD-10-CM | POA: Diagnosis present

## 2014-12-17 DIAGNOSIS — M1612 Unilateral primary osteoarthritis, left hip: Principal | ICD-10-CM | POA: Diagnosis present

## 2014-12-17 HISTORY — PX: TOTAL HIP ARTHROPLASTY: SHX124

## 2014-12-17 SURGERY — ARTHROPLASTY, HIP, TOTAL, ANTERIOR APPROACH
Anesthesia: Spinal | Site: Hip | Laterality: Left

## 2014-12-17 MED ORDER — ONDANSETRON HCL 4 MG/2ML IJ SOLN
INTRAMUSCULAR | Status: DC | PRN
  Administered 2014-12-17: 4 mg via INTRAVENOUS

## 2014-12-17 MED ORDER — BUPIVACAINE HCL (PF) 0.25 % IJ SOLN
INTRAMUSCULAR | Status: DC | PRN
  Administered 2014-12-17: 20 mL

## 2014-12-17 MED ORDER — SODIUM CHLORIDE 0.9 % IJ SOLN
INTRAMUSCULAR | Status: AC
  Filled 2014-12-17: qty 50

## 2014-12-17 MED ORDER — CEFAZOLIN SODIUM-DEXTROSE 2-3 GM-% IV SOLR
2.0000 g | Freq: Four times a day (QID) | INTRAVENOUS | Status: AC
  Administered 2014-12-17 – 2014-12-18 (×2): 2 g via INTRAVENOUS
  Filled 2014-12-17 (×2): qty 50

## 2014-12-17 MED ORDER — MEPERIDINE HCL 50 MG/ML IJ SOLN: 6.2500 mg | INTRAMUSCULAR | Status: DC | PRN

## 2014-12-17 MED ORDER — ACETAMINOPHEN 10 MG/ML IV SOLN
1000.0000 mg | Freq: Once | INTRAVENOUS | Status: AC
  Administered 2014-12-17: 1000 mg via INTRAVENOUS
  Filled 2014-12-17: qty 100

## 2014-12-17 MED ORDER — PROPOFOL INFUSION 10 MG/ML OPTIME
INTRAVENOUS | Status: DC | PRN
  Administered 2014-12-17: 70 ug/kg/min via INTRAVENOUS

## 2014-12-17 MED ORDER — LATANOPROST 0.005 % OP SOLN
1.0000 [drp] | Freq: Every day | OPHTHALMIC | Status: DC
  Administered 2014-12-18: 1 [drp] via OPHTHALMIC
  Filled 2014-12-17: qty 2.5

## 2014-12-17 MED ORDER — METHOCARBAMOL 1000 MG/10ML IJ SOLN
500.0000 mg | Freq: Four times a day (QID) | INTRAMUSCULAR | Status: DC | PRN
  Administered 2014-12-17: 500 mg via INTRAVENOUS
  Filled 2014-12-17 (×2): qty 5

## 2014-12-17 MED ORDER — ACETAMINOPHEN 650 MG RE SUPP
650.0000 mg | Freq: Four times a day (QID) | RECTAL | Status: DC | PRN
Start: 2014-12-18 — End: 2014-12-19

## 2014-12-17 MED ORDER — CEFAZOLIN SODIUM-DEXTROSE 2-3 GM-% IV SOLR
INTRAVENOUS | Status: AC
  Filled 2014-12-17: qty 50

## 2014-12-17 MED ORDER — 0.9 % SODIUM CHLORIDE (POUR BTL) OPTIME
TOPICAL | Status: DC | PRN
  Administered 2014-12-17: 1000 mL

## 2014-12-17 MED ORDER — ACETAMINOPHEN 500 MG PO TABS
1000.0000 mg | ORAL_TABLET | Freq: Four times a day (QID) | ORAL | Status: AC
  Administered 2014-12-17 – 2014-12-18 (×4): 1000 mg via ORAL
  Filled 2014-12-17 (×4): qty 2

## 2014-12-17 MED ORDER — LACTATED RINGERS IV SOLN
INTRAVENOUS | Status: DC
  Administered 2014-12-17: 15:00:00 via INTRAVENOUS
  Administered 2014-12-17: 1000 mL via INTRAVENOUS

## 2014-12-17 MED ORDER — POLYETHYLENE GLYCOL 3350 17 G PO PACK: 17.0000 g | PACK | Freq: Every day | ORAL | Status: DC | PRN

## 2014-12-17 MED ORDER — FENTANYL CITRATE 0.05 MG/ML IJ SOLN
INTRAMUSCULAR | Status: AC
  Filled 2014-12-17: qty 2

## 2014-12-17 MED ORDER — PHENYLEPHRINE HCL 10 MG/ML IJ SOLN
INTRAMUSCULAR | Status: DC | PRN
  Administered 2014-12-17 (×2): 80 ug via INTRAVENOUS

## 2014-12-17 MED ORDER — MIDAZOLAM HCL 5 MG/5ML IJ SOLN
INTRAMUSCULAR | Status: DC | PRN
  Administered 2014-12-17: 2 mg via INTRAVENOUS

## 2014-12-17 MED ORDER — SODIUM CHLORIDE 0.9 % IV SOLN: INTRAVENOUS | Status: DC

## 2014-12-17 MED ORDER — BUPIVACAINE HCL (PF) 0.5 % IJ SOLN
INTRAMUSCULAR | Status: DC | PRN
  Administered 2014-12-17: 15 mg

## 2014-12-17 MED ORDER — TRAMADOL HCL 50 MG PO TABS: 50.0000 mg | ORAL_TABLET | Freq: Four times a day (QID) | ORAL | Status: DC | PRN

## 2014-12-17 MED ORDER — CHLORHEXIDINE GLUCONATE 4 % EX LIQD: 60.0000 mL | Freq: Once | CUTANEOUS | Status: DC

## 2014-12-17 MED ORDER — BISACODYL 10 MG RE SUPP: 10.0000 mg | Freq: Every day | RECTAL | Status: DC | PRN

## 2014-12-17 MED ORDER — TRAZODONE HCL 100 MG PO TABS
200.0000 mg | ORAL_TABLET | Freq: Every day | ORAL | Status: DC
  Administered 2014-12-17 – 2014-12-18 (×2): 200 mg via ORAL
  Filled 2014-12-17 (×3): qty 2

## 2014-12-17 MED ORDER — SODIUM CHLORIDE 0.9 % IJ SOLN
INTRAMUSCULAR | Status: DC | PRN
  Administered 2014-12-17: 30 mL via INTRAVENOUS

## 2014-12-17 MED ORDER — PHENYLEPHRINE 40 MCG/ML (10ML) SYRINGE FOR IV PUSH (FOR BLOOD PRESSURE SUPPORT)
PREFILLED_SYRINGE | INTRAVENOUS | Status: AC
  Filled 2014-12-17: qty 10

## 2014-12-17 MED ORDER — ONDANSETRON HCL 4 MG PO TABS: 4.0000 mg | ORAL_TABLET | Freq: Four times a day (QID) | ORAL | Status: DC | PRN

## 2014-12-17 MED ORDER — HYDROCHLOROTHIAZIDE 25 MG PO TABS
25.0000 mg | ORAL_TABLET | Freq: Every morning | ORAL | Status: DC
  Administered 2014-12-18 – 2014-12-19 (×2): 25 mg via ORAL
  Filled 2014-12-17 (×2): qty 1

## 2014-12-17 MED ORDER — PROMETHAZINE HCL 25 MG/ML IJ SOLN: 6.2500 mg | INTRAMUSCULAR | Status: DC | PRN

## 2014-12-17 MED ORDER — PROPOFOL 10 MG/ML IV BOLUS
INTRAVENOUS | Status: AC
  Filled 2014-12-17: qty 20

## 2014-12-17 MED ORDER — BUPIVACAINE LIPOSOME 1.3 % IJ SUSP
INTRAMUSCULAR | Status: DC | PRN
  Administered 2014-12-17: 20 mL

## 2014-12-17 MED ORDER — PHENOL 1.4 % MT LIQD
1.0000 | OROMUCOSAL | Status: DC | PRN
  Filled 2014-12-17: qty 177

## 2014-12-17 MED ORDER — BUPIVACAINE HCL (PF) 0.5 % IJ SOLN
INTRAMUSCULAR | Status: AC
  Filled 2014-12-17: qty 30

## 2014-12-17 MED ORDER — SERTRALINE HCL 50 MG PO TABS
50.0000 mg | ORAL_TABLET | Freq: Every morning | ORAL | Status: DC
  Administered 2014-12-18 – 2014-12-19 (×2): 50 mg via ORAL
  Filled 2014-12-17 (×2): qty 1

## 2014-12-17 MED ORDER — MENTHOL 3 MG MT LOZG
1.0000 | LOZENGE | OROMUCOSAL | Status: DC | PRN
  Filled 2014-12-17: qty 9

## 2014-12-17 MED ORDER — KCL IN DEXTROSE-NACL 20-5-0.9 MEQ/L-%-% IV SOLN
INTRAVENOUS | Status: DC
  Administered 2014-12-17: 18:00:00 via INTRAVENOUS
  Filled 2014-12-17 (×3): qty 1000

## 2014-12-17 MED ORDER — DOCUSATE SODIUM 100 MG PO CAPS
100.0000 mg | ORAL_CAPSULE | Freq: Two times a day (BID) | ORAL | Status: DC
  Administered 2014-12-17 – 2014-12-19 (×4): 100 mg via ORAL

## 2014-12-17 MED ORDER — BUPIVACAINE HCL (PF) 0.25 % IJ SOLN
INTRAMUSCULAR | Status: AC
  Filled 2014-12-17: qty 30

## 2014-12-17 MED ORDER — PANTOPRAZOLE SODIUM 40 MG PO TBEC
40.0000 mg | DELAYED_RELEASE_TABLET | Freq: Every day | ORAL | Status: DC
  Administered 2014-12-18: 40 mg via ORAL
  Filled 2014-12-17: qty 1

## 2014-12-17 MED ORDER — FENTANYL CITRATE 0.05 MG/ML IJ SOLN
INTRAMUSCULAR | Status: DC | PRN
  Administered 2014-12-17: 100 ug via INTRAVENOUS

## 2014-12-17 MED ORDER — KETOROLAC TROMETHAMINE 15 MG/ML IJ SOLN: 7.5000 mg | Freq: Four times a day (QID) | INTRAMUSCULAR | Status: AC | PRN

## 2014-12-17 MED ORDER — DEXAMETHASONE SODIUM PHOSPHATE 10 MG/ML IJ SOLN
10.0000 mg | Freq: Once | INTRAMUSCULAR | Status: AC
  Administered 2014-12-18: 10 mg via INTRAVENOUS
  Filled 2014-12-17: qty 1

## 2014-12-17 MED ORDER — SIMVASTATIN 20 MG PO TABS
20.0000 mg | ORAL_TABLET | Freq: Every day | ORAL | Status: DC
  Administered 2014-12-17 – 2014-12-18 (×2): 20 mg via ORAL
  Filled 2014-12-17 (×3): qty 1

## 2014-12-17 MED ORDER — PREGABALIN 50 MG PO CAPS
50.0000 mg | ORAL_CAPSULE | Freq: Every day | ORAL | Status: DC
  Administered 2014-12-17 – 2014-12-19 (×3): 50 mg via ORAL
  Filled 2014-12-17 (×3): qty 1

## 2014-12-17 MED ORDER — CEFAZOLIN SODIUM-DEXTROSE 2-3 GM-% IV SOLR
2.0000 g | INTRAVENOUS | Status: AC
  Administered 2014-12-17: 2 g via INTRAVENOUS

## 2014-12-17 MED ORDER — FLEET ENEMA 7-19 GM/118ML RE ENEM: 1.0000 | ENEMA | Freq: Once | RECTAL | Status: AC | PRN

## 2014-12-17 MED ORDER — FERROUS SULFATE 325 (65 FE) MG PO TABS
325.0000 mg | ORAL_TABLET | Freq: Every day | ORAL | Status: DC
  Administered 2014-12-18 – 2014-12-19 (×2): 325 mg via ORAL
  Filled 2014-12-17 (×3): qty 1

## 2014-12-17 MED ORDER — SODIUM CHLORIDE 0.9 % IV SOLN
10.0000 mg | INTRAVENOUS | Status: DC | PRN
  Administered 2014-12-17: 50 ug/min via INTRAVENOUS

## 2014-12-17 MED ORDER — METOCLOPRAMIDE HCL 10 MG PO TABS: 5.0000 mg | ORAL_TABLET | Freq: Three times a day (TID) | ORAL | Status: DC | PRN

## 2014-12-17 MED ORDER — ACETAMINOPHEN 325 MG PO TABS
650.0000 mg | ORAL_TABLET | Freq: Four times a day (QID) | ORAL | Status: DC | PRN
Start: 2014-12-18 — End: 2014-12-19
  Administered 2014-12-19: 650 mg via ORAL
  Filled 2014-12-17: qty 2

## 2014-12-17 MED ORDER — DEXAMETHASONE SODIUM PHOSPHATE 10 MG/ML IJ SOLN
10.0000 mg | Freq: Once | INTRAMUSCULAR | Status: AC
  Administered 2014-12-17: 10 mg via INTRAVENOUS

## 2014-12-17 MED ORDER — METHOCARBAMOL 500 MG PO TABS
500.0000 mg | ORAL_TABLET | Freq: Four times a day (QID) | ORAL | Status: DC | PRN
  Administered 2014-12-18 (×2): 500 mg via ORAL
  Filled 2014-12-17 (×2): qty 1

## 2014-12-17 MED ORDER — RIVAROXABAN 10 MG PO TABS
10.0000 mg | ORAL_TABLET | Freq: Every day | ORAL | Status: DC
  Administered 2014-12-18 – 2014-12-19 (×2): 10 mg via ORAL
  Filled 2014-12-17 (×3): qty 1

## 2014-12-17 MED ORDER — DEXAMETHASONE SODIUM PHOSPHATE 10 MG/ML IJ SOLN
INTRAMUSCULAR | Status: AC
Start: 2014-12-17 — End: 2014-12-17
  Filled 2014-12-17: qty 1

## 2014-12-17 MED ORDER — ONDANSETRON HCL 4 MG/2ML IJ SOLN
INTRAMUSCULAR | Status: AC
  Filled 2014-12-17: qty 2

## 2014-12-17 MED ORDER — DIPHENHYDRAMINE HCL 12.5 MG/5ML PO ELIX: 12.5000 mg | ORAL_SOLUTION | ORAL | Status: DC | PRN

## 2014-12-17 MED ORDER — MIDAZOLAM HCL 2 MG/2ML IJ SOLN
INTRAMUSCULAR | Status: AC
  Filled 2014-12-17: qty 2

## 2014-12-17 MED ORDER — METOCLOPRAMIDE HCL 5 MG/ML IJ SOLN: 5.0000 mg | Freq: Three times a day (TID) | INTRAMUSCULAR | Status: DC | PRN

## 2014-12-17 MED ORDER — BUPIVACAINE LIPOSOME 1.3 % IJ SUSP
20.0000 mL | Freq: Once | INTRAMUSCULAR | Status: DC
  Filled 2014-12-17: qty 20

## 2014-12-17 MED ORDER — TRANEXAMIC ACID 100 MG/ML IV SOLN
1000.0000 mg | INTRAVENOUS | Status: AC
  Administered 2014-12-17: 1000 mg via INTRAVENOUS
  Filled 2014-12-17: qty 10

## 2014-12-17 MED ORDER — ONDANSETRON HCL 4 MG/2ML IJ SOLN: 4.0000 mg | Freq: Four times a day (QID) | INTRAMUSCULAR | Status: DC | PRN

## 2014-12-17 MED ORDER — FENTANYL CITRATE 0.05 MG/ML IJ SOLN: 25.0000 ug | INTRAMUSCULAR | Status: DC | PRN

## 2014-12-17 MED ORDER — OXYCODONE HCL 5 MG PO TABS
5.0000 mg | ORAL_TABLET | ORAL | Status: DC | PRN
  Administered 2014-12-17: 10 mg via ORAL
  Administered 2014-12-17: 5 mg via ORAL
  Administered 2014-12-18 – 2014-12-19 (×7): 10 mg via ORAL
  Filled 2014-12-17: qty 1
  Filled 2014-12-17 (×9): qty 2

## 2014-12-17 SURGICAL SUPPLY — 43 items
BAG ZIPLOCK 12X15 (MISCELLANEOUS) ×3 IMPLANT
BLADE EXTENDED COATED 6.5IN (ELECTRODE) ×3 IMPLANT
BLADE SAG 18X100X1.27 (BLADE) ×3 IMPLANT
CAPT HIP TOTAL 2 ×3 IMPLANT
CLOSURE WOUND 1/2 X4 (GAUZE/BANDAGES/DRESSINGS) ×1
COVER PERINEAL POST (MISCELLANEOUS) ×3 IMPLANT
DECANTER SPIKE VIAL GLASS SM (MISCELLANEOUS) ×3 IMPLANT
DRAPE C-ARM 42X120 X-RAY (DRAPES) ×3 IMPLANT
DRAPE STERI IOBAN 125X83 (DRAPES) ×3 IMPLANT
DRAPE U-SHAPE 47X51 STRL (DRAPES) ×9 IMPLANT
DRSG ADAPTIC 3X8 NADH LF (GAUZE/BANDAGES/DRESSINGS) ×3 IMPLANT
DRSG MEPILEX BORDER 4X4 (GAUZE/BANDAGES/DRESSINGS) ×3 IMPLANT
DRSG MEPILEX BORDER 4X8 (GAUZE/BANDAGES/DRESSINGS) ×3 IMPLANT
DURAPREP 26ML APPLICATOR (WOUND CARE) ×3 IMPLANT
ELECT REM PT RETURN 9FT ADLT (ELECTROSURGICAL) ×3
ELECTRODE REM PT RTRN 9FT ADLT (ELECTROSURGICAL) ×1 IMPLANT
EVACUATOR 1/8 PVC DRAIN (DRAIN) ×3 IMPLANT
FACESHIELD WRAPAROUND (MASK) ×12 IMPLANT
GLOVE BIO SURGEON STRL SZ7.5 (GLOVE) ×6 IMPLANT
GLOVE BIO SURGEON STRL SZ8 (GLOVE) ×3 IMPLANT
GLOVE BIOGEL PI IND STRL 7.0 (GLOVE) ×1 IMPLANT
GLOVE BIOGEL PI IND STRL 7.5 (GLOVE) ×1 IMPLANT
GLOVE BIOGEL PI IND STRL 8 (GLOVE) ×2 IMPLANT
GLOVE BIOGEL PI INDICATOR 7.0 (GLOVE) ×2
GLOVE BIOGEL PI INDICATOR 7.5 (GLOVE) ×2
GLOVE BIOGEL PI INDICATOR 8 (GLOVE) ×4
GLOVE SURG SS PI 7.0 STRL IVOR (GLOVE) ×3 IMPLANT
GOWN STRL REUS W/TWL LRG LVL3 (GOWN DISPOSABLE) ×3 IMPLANT
GOWN STRL REUS W/TWL XL LVL3 (GOWN DISPOSABLE) ×9 IMPLANT
KIT BASIN OR (CUSTOM PROCEDURE TRAY) ×3 IMPLANT
NDL SAFETY ECLIPSE 18X1.5 (NEEDLE) ×2 IMPLANT
NEEDLE HYPO 18GX1.5 SHARP (NEEDLE) ×4
PACK TOTAL JOINT (CUSTOM PROCEDURE TRAY) ×3 IMPLANT
STRIP CLOSURE SKIN 1/2X4 (GAUZE/BANDAGES/DRESSINGS) ×2 IMPLANT
SUT ETHIBOND NAB CT1 #1 30IN (SUTURE) ×3 IMPLANT
SUT MNCRL AB 4-0 PS2 18 (SUTURE) ×3 IMPLANT
SUT VIC AB 2-0 CT1 27 (SUTURE) ×4
SUT VIC AB 2-0 CT1 TAPERPNT 27 (SUTURE) ×2 IMPLANT
SUT VLOC 180 0 24IN GS25 (SUTURE) ×3 IMPLANT
SYR 20CC LL (SYRINGE) ×3 IMPLANT
SYR 50ML LL SCALE MARK (SYRINGE) ×3 IMPLANT
TOWEL OR 17X26 10 PK STRL BLUE (TOWEL DISPOSABLE) ×3 IMPLANT
TRAY FOLEY CATH 14FRSI W/METER (CATHETERS) ×3 IMPLANT

## 2014-12-17 NOTE — Op Note (Signed)
OPERATIVE REPORT  PREOPERATIVE DIAGNOSIS: Osteoarthritis of the Left hip.   POSTOPERATIVE DIAGNOSIS: Osteoarthritis of the Left  hip.   PROCEDURE: Left total hip arthroplasty, anterior approach.   SURGEON: Gaynelle Arabian, MD   ASSISTANT: Otilio Jefferson  ANESTHESIA:  Spinal  ESTIMATED BLOOD LOSS:- 400 ml  DRAINS: Hemovac x1.   COMPLICATIONS: None   CONDITION: PACU - hemodynamically stable.   BRIEF CLINICAL NOTE: [name] is a 81 y.o. female who has advanced end-  stage arthritis of her Left  hip with progressively worsening pain and  dysfunction.The patient has failed nonoperative management and presents for  total hip arthroplasty.   PROCEDURE IN DETAIL: After successful administration of spinal  anesthetic, the traction boots for the [facility] bed were placed on both  feet and the patient was placed onto the [facility] bed, boots placed into the leg  holders. The Left hip was then isolated from the perineum with plastic  drapes and prepped and draped in the usual sterile fashion. ASIS and  greater trochanter were marked and a oblique incision was made, starting  at about 1 cm lateral and 2 cm distal to the ASIS and coursing towards  the anterior cortex of the femur. The skin was cut with a 10 blade  through subcutaneous tissue to the level of the fascia overlying the  tensor fascia lata muscle. The fascia was then incised in line with the  incision at the junction of the anterior third and posterior 2/3rd. The  muscle was teased off the fascia and then the interval between the TFL  and the rectus was developed. The Hohmann retractor was then placed at  the top of the femoral neck over the capsule. The vessels overlying the  capsule were cauterized and the fat on top of the capsule was removed.  A Hohmann retractor was then placed anterior underneath the rectus  femoris to give exposure to the entire anterior capsule. A T-shaped  capsulotomy was performed. The  edges were tagged and the femoral head  was identified.       Osteophytes are removed off the superior acetabulum.  The femoral neck was then cut in situ with an oscillating saw. Traction  was then applied to the left lower extremity utilizing the [facility]  traction. The femoral head was then removed. Retractors were placed  around the acetabulum and then circumferential removal of the labrum was  performed. Osteophytes were also removed. Reaming starts at 47 mm to  medialize and  Increased in 2 mm increments to 53 mm. We reamed in  approximately 40 degrees of abduction, 20 degrees anteversion. A 54 mm  pinnacle acetabular shell was then impacted in anatomic position under  fluoroscopic guidance with excellent purchase. We did not need to place  any additional dome screws. A 36 mm neutral + 4 marathon liner was then  placed into the acetabular shell.       The femoral lift was then placed along the lateral aspect of the femur  just distal to the vastus ridge. The leg was  externally rotated and capsule  was stripped off the inferior aspect of the femoral neck down to the  level of the lesser trochanter, this was done with electrocautery. The femur was lifted after this was performed. The  leg was then placed and extended in adducted position to essentially delivering the femur. We also removed the capsule superiorly and the  piriformis from the piriformis fossa  to gain excellent exposure of the  proximal femur. Rongeur was used to remove some cancellous bone to get  into the lateral portion of the proximal femur for placement of the  initial starter reamer. The starter broaches was placed  the starter broach  and was shown to go down the center of the canal. Broaching  with the  Corail system was then performed starting at size 8, coursing  Up to size 14. A size 14 had excellent torsional and rotational  and axial stability. The trial standard offset neck was then placed  with a 36 + 1.5 trial  head. The hip was then reduced. We confirmed that  the stem was in the canal both on AP and lateral x-rays. It also has excellent sizing. The hip was reduced with outstanding stability through full extension, full external rotation,  and then flexion in adduction internal rotation. AP pelvis was taken  and the leg lengths were measured and found to be exactly equal. Hip  was then dislocated again and the femoral head and neck removed. The  femoral broach was removed. Size 14 Corail stem with a standard offset  neck was then impacted into the femur following native anteversion. Has  excellent purchase in the canal. Excellent torsional and rotational and  axial stability. It is confirmed to be in the canal on AP and lateral  fluoroscopic views. The 36 + 1.5 metal head was placed and the hip  reduced with outstanding stability. Again AP pelvis was taken and it  confirmed that the leg lengths were equal. The wound was then copiously  irrigated with saline solution and the capsule reattached and repaired  with Ethibond suture.  20 mL of Exparel mixed with 50 mL of saline then additional 20 ml of .25% Bupivicaine injected into the capsule and into the edge of the tensor fascia lata as well as subcutaneous tissue. The fascia overlying the tensor fascia lata was  then closed with a running #1 V-Loc. Subcu was closed with interrupted  2-0 Vicryl and subcuticular running 4-0 Monocryl. Incision was cleaned  and dried. Steri-Strips and a bulky sterile dressing applied. Hemovac  drain was hooked to suction and then he was awakened and transported to  recovery in stable condition.        Please note that a surgical assistant was a medical necessity for this procedure to perform it in a safe and expeditious manner. Assistant was necessary to provide appropriate retraction of vital neurovascular structures and to prevent femoral fracture and allow for anatomic placement of the prosthesis.  Gaynelle Arabian, M.D.

## 2014-12-17 NOTE — Transfer of Care (Signed)
Immediate Anesthesia Transfer of Care Note  Patient: [name]  Procedure(s) Performed: Procedure(s): LEFT TOTAL HIP ARTHROPLASTY ANTERIOR APPROACH (Left)  Patient Location: PACU  Anesthesia Type:Regional and Spinal  Level of Consciousness: awake, oriented, sedated and patient cooperative  Airway & Oxygen Therapy: Patient Spontanous Breathing and Patient connected to face mask oxygen  Post-op Assessment: Report given to PACU RN and Post -op Vital signs reviewed and stable  Post vital signs: Reviewed and stable  Complications: No apparent anesthesia complications

## 2014-12-17 NOTE — Interval H&P Note (Signed)
History and Physical Interval Note:  12/17/2014 12:34 PM  [name]  has presented today for surgery, with the diagnosis of OA LEFT HIP  The various methods of treatment have been discussed with the patient and family. After consideration of risks, benefits and other options for treatment, the patient has consented to  Procedure(s): LEFT TOTAL HIP ARTHROPLASTY ANTERIOR APPROACH (Left) as a surgical intervention .  The patient's history has been reviewed, patient examined, no change in status, stable for surgery.  I have reviewed the patient's chart and labs.  Questions were answered to the patient's satisfaction.     Gearlean Alf

## 2014-12-17 NOTE — Anesthesia Postprocedure Evaluation (Signed)
  Anesthesia Post-op Note  Patient: [name]  Procedure(s) Performed: Procedure(s) (LRB): LEFT TOTAL HIP ARTHROPLASTY ANTERIOR APPROACH (Left)  Patient Location: PACU  Anesthesia Type: Spinal  Level of Consciousness: awake and alert   Airway and Oxygen Therapy: Patient Spontanous Breathing  Post-op Pain: mild  Post-op Assessment: Post-op Vital signs reviewed, Patient's Cardiovascular Status Stable, Respiratory Function Stable, Patent Airway and No signs of Nausea or vomiting  Last Vitals:  Filed Vitals:   12/17/14 1650  BP: 120/60  Pulse: 60  Temp: 36.5 C  Resp: 16    Post-op Vital Signs: stable   Complications: No apparent anesthesia complications

## 2014-12-17 NOTE — Anesthesia Procedure Notes (Signed)
Spinal Patient location during procedure: OR Start time: 12/17/2014 1:31 PM End time: 12/17/2014 1:33 PM Staffing Resident/CRNA: Harle Stanford R Performed by: resident/CRNA  Preanesthetic Checklist Completed: patient identified, site marked, surgical consent, pre-op evaluation, timeout performed, IV checked, risks and benefits discussed and monitors and equipment checked Spinal Block Patient position: sitting Prep: Betadine Patient monitoring: heart rate, cardiac monitor, continuous pulse ox and blood pressure Approach: midline Location: L3-4 Injection technique: single-shot Needle Needle type: Sprotte  Needle gauge: 24 G Needle length: 9 cm Needle insertion depth: 7 cm Assessment Sensory level: T6

## 2014-12-17 NOTE — H&P (View-Only) (Signed)
[name] DOB: 1930-10-19 Widowed / Language: [name] / Race: White Female Date of Admission:  12/17/2014 CC:  Left Hip Pain History of Present Illness The patient is a 81 year old female who comes in for a preoperative History and Physical. The patient is scheduled for a left total hip arthroplasty (anterior approach) to be performed by Dr. Dione Plover. Aluisio, MD at [facility] on 12-17-2014. The patient is a 81 year old female who presents with a hip problem. The patient was seen in referral from Dr. Nelva Bush.The patient reported bilateral hip problems including pain symptoms. She has since had the right hip replaced and doing much better from that standpoint. The symptoms began without any known injury. Previous workup for this problem has included hip x-rays. Previous treatment for this problem has included corticosteroid injections. The right is much im[proved but the left hip continues to be a problem and interfere with her activity. The hip has gotten progressively worse over the past several monthat the point where it is hurting her all the time now. It is limiting what she can and cannot do. She is normally extremely active but the hip has been curtailing that. The hip is keeping her up at night. She did well with the right hip and she is now ready to proceed with the left hip replacement.  They have been treated conservatively in the past for the above stated problem and despite conservative measures, they continue to have progressive pain and severe functional limitations and dysfunction. They have failed non-operative management including home exercise, medications, and injections. It is felt that they would benefit from undergoing total joint replacement. Risks and benefits of the procedure have been discussed with the patient and they elect to proceed with surgery. There are no active contraindications to surgery such as ongoing infection or rapidly progressive neurological  disease.  Problem List/Past Medical Osteoarthritis of both hips resulting from hip dysplasia (M16.2) Status post right hip replacement (Y09.983) Neuropathy (G62.9) Neuropathy, peripheral (356.9) Foot, joint pain (719.47) Foot swelling (M79.89) Cervical disc degeneration (722.4) (M50.30) Osteoarthritis of left hip, unspecified osteoarthritis type (M16.12) Irritable bowel syndrome Osteoarthritis Peripheral Neuropathy Sleep Apnea High blood pressure Skin Cancer Chronic Pain Fibromyalgia Gastroesophageal Reflux Disease Anxiety Disorder Depression Hammer toe, other, acquired (735.4)05/14/2004 Degenerative Disc Disease  Allergies Novocain *LOCAL ANESTHETICS-Parenteral* facial blistering after dentist visit GOLD SHOTS  Family History Cancer mother Cerebrovascular Accident mother Depression mother Drug / Alcohol Addiction father, brother, grandfather mothers side and grandmother fathers side Diabetes Mellitus brother Kidney disease brother Osteoarthritis mother Hypertension First Degree Relatives. mother, father and brother  Social History Pain Contract no Tobacco use Never smoker. 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  Medication History Simvastatin (20MG  Tablet, Oral) Active. Sertraline HCl (50MG  Tablet, Oral) Active. Lisinopril (30MG  Tablet, Oral) Active. Hydrochlorothiazide (25MG  Tablet, Oral) Active. TraZODone HCl (100MG  Tablet, Oral) Active. Lyrica (50MG  Capsule, Oral) Active. Lumigan (0.01% Solution, Ophthalmic) Active. Vivelle-Dot (0.05MG /24HR Patch TW, Transdermal) Active. Aspirin (325MG  Tablet, 1  (one) Oral) Active. Stool Softener (100MG  Capsule, Oral) Active. Glucosamine HCl (1500MG  Tablet, Oral) Active. Omeprazole (40MG  Capsule DR, Oral) Active. Fenofibrate (48MG  Tablet, Oral) Active. Benadryl Allergy (25MG  Capsule, Oral) Active.  Past Surgical History Colon Polyp Removal - Colonoscopy  Hysterectomy complete (cancerous) Appendectomy Gallbladder Surgery open Cesarean Delivery 2 times Arthroscopy of Shoulder right Spinal Fusion lower back Breast Biopsy left Cataract Surgery bilateral Carpal Tunnel Repair left Total Knee Replacement bilateral Spinal Surgery Tubal Ligation Tonsillectomy   Review of Systems  General Not Present- Chills, Fatigue, Fever, Memory Loss, Night Sweats, Weight Gain and Weight Loss. Skin Not Present- Eczema, Hives, Itching, Lesions and Rash. HEENT Not Present- Dentures, Double Vision, Headache, Hearing Loss, Tinnitus and Visual Loss. Respiratory Not Present- Allergies, Chronic Cough, Coughing up blood, Shortness of breath at rest and Shortness of breath with exertion. Cardiovascular Not Present- Chest Pain, Difficulty Breathing Lying Down, Murmur, Palpitations, Racing/skipping heartbeats and Swelling. Gastrointestinal Not Present- Abdominal Pain, Bloody Stool, Constipation, Diarrhea, Difficulty Swallowing, Heartburn, Jaundice, Loss of appetitie, Nausea and Vomiting. Female Genitourinary Not Present- Blood in Urine, Discharge, Flank Pain, Incontinence, Painful Urination, Urgency, Urinary frequency, Urinary Retention, Urinating at Night and Weak urinary stream. Musculoskeletal Present- Joint Pain. Not Present- Back Pain, Joint Swelling, Morning Stiffness, Muscle Pain, Muscle Weakness and Spasms. Neurological Not Present- Blackout spells, Difficulty with balance, Dizziness, Paralysis, Tremor and Weakness. Psychiatric Not Present- Insomnia. Endocrine Present- Hot flashes.  Vitals  Height: 70in Pulse: 64 (Regular)  BP: 101/56  (Sitting, Right Arm, Standard)   Physical Exam  General Mental Status -Alert, cooperative and good historian. General Appearance-pleasant, Not in acute distress. Orientation-Oriented X3. Build & Nutrition-Well nourished and Well developed.  Head and Neck Head-normocephalic, atraumatic . Neck Global Assessment - supple, no bruit auscultated on the right, no bruit auscultated on the left.  Eye Pupil - Bilateral-Regular and Round. Motion - Bilateral-EOMI.  Chest and Lung Exam Auscultation Breath sounds - clear at anterior chest wall and clear at posterior chest wall. Adventitious sounds - No Adventitious sounds.  Cardiovascular Auscultation Rhythm - Regular rate and rhythm. Heart Sounds - S1 WNL and S2 WNL. Murmurs & Other Heart Sounds - Auscultation of the heart reveals - No Murmurs.  Abdomen Palpation/Percussion Tenderness - Abdomen is non-tender to palpation. Rigidity (guarding) - Abdomen is soft. Auscultation Auscultation of the abdomen reveals - Bowel sounds normal.  Female Genitourinary Note: Not done, not pertinent to present illness   Musculoskeletal Note: On exam, well developed female alert and oriented in no apparent distress.  Left hip flexion to 110, rotation in 20, out 30, and abduction 30.  Assessment & Plan  Osteoarthritis of left hip, unspecified osteoarthritis type (M16.12) Note:Plan is for a Left Total Hip Replacement - anterior approach by Dr. Wynelle Link.  Plan is to go to [facility] for [employer].  PCP - Dr. Sherrine Maples  The patient does not have any contraindications and will receive TXA (tranexamic acid) prior to surgery.  Signed electronically by Joelene Millin, III PA-C

## 2014-12-17 NOTE — Progress Notes (Signed)
Utilization review completed.  

## 2014-12-17 NOTE — Anesthesia Preprocedure Evaluation (Addendum)
Anesthesia Evaluation  Patient identified by MRN, date of birth, ID band Patient awake    Reviewed: Allergy & Precautions, H&P , NPO status , Patient's Chart, lab work & pertinent test results  History of Anesthesia Complications (+) history of anesthetic complications (suspect aspiration pneumonia after colonoscopy)  Airway Mallampati: II  TM Distance: >3 FB Neck ROM: Full    Dental no notable dental hx. (+) Caps   Pulmonary neg pulmonary ROS,  breath sounds clear to auscultation  Pulmonary exam normal       Cardiovascular hypertension, Pt. on medications Rhythm:Regular Rate:Normal     Neuro/Psych negative neurological ROS  negative psych ROS   GI/Hepatic negative GI ROS, Neg liver ROS,   Endo/Other  negative endocrine ROS  Renal/GU negative Renal ROS  negative genitourinary   Musculoskeletal  (+) Fibromyalgia -  Abdominal   Peds negative pediatric ROS (+)  Hematology negative hematology ROS (+)   Anesthesia Other Findings   Reproductive/Obstetrics negative OB ROS                            Anesthesia Physical  Anesthesia Plan  ASA: II  Anesthesia Plan: Spinal   Post-op Pain Management:    Induction:   Airway Management Planned: Simple Face Mask  Additional Equipment:   Intra-op Plan:   Post-operative Plan:   Informed Consent: I have reviewed the patients History and Physical, chart, labs and discussed the procedure including the risks, benefits and alternatives for the proposed anesthesia with the patient or authorized representative who has indicated his/her understanding and acceptance.   Dental advisory given  Plan Discussed with: CRNA  Anesthesia Plan Comments:         Anesthesia Quick Evaluation

## 2014-12-18 ENCOUNTER — Encounter (HOSPITAL_COMMUNITY): Payer: Self-pay | Admitting: Orthopedic Surgery

## 2014-12-18 LAB — BASIC METABOLIC PANEL
Anion gap: 8 (ref 5–15)
BUN: 21 mg/dL (ref 6–23)
CO2: 26 mmol/L (ref 19–32)
Calcium: 8.4 mg/dL (ref 8.4–10.5)
Chloride: 100 mEq/L (ref 96–112)
Creatinine, Ser: 0.71 mg/dL (ref 0.50–1.10)
GFR calc Af Amer: >90 mL/min (ref >90)
GFR calc non Af Amer: 78 mL/min — ABNORMAL LOW (ref >90)
Glucose, Bld: 146 mg/dL — ABNORMAL HIGH (ref 70–99)
Potassium: 3.6 mmol/L (ref 3.5–5.1)
Sodium: 134 mmol/L — ABNORMAL LOW (ref 135–145)

## 2014-12-18 LAB — CBC
HCT: 31.5 % — ABNORMAL LOW (ref 36.0–46.0)
Hemoglobin: 10.4 g/dL — ABNORMAL LOW (ref 12.0–15.0)
MCH: 29.1 pg (ref 26.0–34.0)
MCHC: 33 g/dL (ref 30.0–36.0)
MCV: 88 fL (ref 78.0–100.0)
PLATELETS: 184 10*3/uL (ref 150–400)
RBC: 3.58 MIL/uL — AB (ref 3.87–5.11)
RDW: 15 % (ref 11.5–15.5)
WBC: 16.6 10*3/uL — AB (ref 4.0–10.5)

## 2014-12-18 MED ORDER — RIVAROXABAN 10 MG PO TABS: 10.0000 mg | ORAL_TABLET | Freq: Every day | ORAL | Status: DC

## 2014-12-18 MED ORDER — NON FORMULARY: 40.0000 mg | Freq: Every day | Status: DC

## 2014-12-18 MED ORDER — OXYCODONE HCL 5 MG PO TABS: 5.0000 mg | ORAL_TABLET | ORAL | Status: DC | PRN

## 2014-12-18 MED ORDER — OMEPRAZOLE 20 MG PO CPDR
40.0000 mg | DELAYED_RELEASE_CAPSULE | Freq: Every day | ORAL | Status: DC
  Administered 2014-12-19: 40 mg via ORAL
  Filled 2014-12-18 (×2): qty 2

## 2014-12-18 MED ORDER — TRAMADOL HCL 50 MG PO TABS: 50.0000 mg | ORAL_TABLET | Freq: Four times a day (QID) | ORAL | Status: DC | PRN

## 2014-12-18 MED ORDER — METHOCARBAMOL 500 MG PO TABS: 500.0000 mg | ORAL_TABLET | Freq: Four times a day (QID) | ORAL | Status: DC | PRN

## 2014-12-18 NOTE — Discharge Instructions (Addendum)
Dr. Gaynelle Arabian [facility] [address] [place],  [address] [phone]    ANTERIOR APPROACH TOTAL HIP REPLACEMENT POSTOPERATIVE DIRECTIONS   Hip Rehabilitation, Guidelines Following Surgery  The results of a hip operation are greatly improved after range of motion and muscle strengthening exercises. Follow all safety measures which are given to protect your hip. If any of these exercises cause increased pain or swelling in your joint, decrease the amount until you are comfortable again. Then slowly increase the exercises. Call your caregiver if you have problems or questions.  HOME CARE INSTRUCTIONS  Most of the following instructions are designed to prevent the dislocation of your new hip.  Remove items at home which could result in a fall. This includes throw rugs or furniture in walking pathways.  Continue medications as instructed at time of discharge.  You may have some home medications which will be placed on hold until you complete the course of blood thinner medication.  You may start showering once you are discharged home but do not submerge the incision under water. Just pat the incision dry and apply a dry gauze dressing on daily. Do not put on socks or shoes without following the instructions of your caregivers.  Sit on high chairs which makes it easier to stand.  Sit on chairs with arms. Use the chair arms to help push yourself up when arising.  Keep your leg on the side of the operation out in front of you when standing up.  Arrange for the use of a toilet seat elevator so you are not sitting low.    Walk with walker as instructed.  You may resume a sexual relationship in one month or when given the OK by your caregiver.  Use walker as long as suggested by your caregivers.  You may put full weight on your legs and walk as much as is comfortable. Avoid periods of inactivity such as sitting longer than an hour  when not asleep. This helps prevent blood clots.  You may return to work once you are cleared by [employer], production.  Do not drive a car for 6 weeks or until released by your surgeon.  Do not drive while taking narcotics.  Wear elastic stockings for three weeks following surgery during the day but you may remove then at night.  Make sure you keep all of your appointments after your operation with all of your doctors and caregivers. You should call the office at the above phone number and make an appointment for approximately two weeks after the date of your surgery. Change the dressing daily and reapply a dry dressing each time. Please pick up a stool softener and laxative for home use as long as you are requiring pain medications.  ICE to the affected hip every three hours for 30 minutes at a time and then as needed for pain and swelling.  Continue to use ice on the hip for pain and swelling from surgery. You may notice swelling that will progress down to the foot and ankle.  This is normal after surgery.  Elevate the leg when you are not up walking on it.   It is important for you to complete the blood thinner medication as prescribed by your doctor.  Continue to use the breathing machine which will help keep your temperature down.  It is common for your temperature to cycle up and down following surgery, especially at night when you are not up moving around  and exerting yourself.  The breathing machine keeps your lungs expanded and your temperature down.  RANGE OF MOTION AND STRENGTHENING EXERCISES  These exercises are designed to help you keep full movement of your hip joint. Follow your caregiver's or physical therapist's instructions. Perform all exercises about fifteen times, three times per day or as directed. Exercise both hips, even if you have had only one joint replacement. These exercises can be done on a training (exercise) mat, on the floor, on a table or on a bed. Use whatever works the best  and is most comfortable for you. Use music or television while you are exercising so that the exercises are a pleasant break in your day. This will make your life better with the exercises acting as a break in routine you can look forward to.  Lying on your back, slowly slide your foot toward your buttocks, raising your knee up off the floor. Then slowly slide your foot back down until your leg is straight again.  Lying on your back spread your legs as far apart as you can without causing discomfort.  Lying on your side, raise your upper leg and foot straight up from the floor as far as is comfortable. Slowly lower the leg and repeat.  Lying on your back, tighten up the muscle in the front of your thigh (quadriceps muscles). You can do this by keeping your leg straight and trying to raise your heel off the floor. This helps strengthen the largest muscle supporting your knee.  Lying on your back, tighten up the muscles of your buttocks both with the legs straight and with the knee bent at a comfortable angle while keeping your heel on the floor.   SKILLED REHAB INSTRUCTIONS: If the patient is transferred to a skilled rehab facility following release from the hospital, a list of the current medications will be sent to the facility for the patient to continue.  When discharged from the skilled rehab facility, please have the facility set up the patient's [place] prior to being released. Also, the skilled facility will be responsible for providing the patient with their medications at time of release from the facility to include their pain medication, the muscle relaxants, and their blood thinner medication. If the patient is still at the rehab facility at time of the two week follow up appointment, the skilled rehab facility will also need to assist the patient in arranging follow up appointment in our office and any transportation needs.  MAKE SURE YOU:  Understand these instructions.    Will watch your condition.  Will get help right away if you are not doing well or get worse.  Pick up stool softner and laxative for home use following surgery while on pain medications. Do not submerge incision under water. Please use good hand washing techniques while changing dressing each day. May shower starting three days after surgery. Please use a clean towel to pat the incision dry following showers. Continue to use ice for pain and swelling after surgery. Do not use any lotions or creams on the incision until instructed by your surgeon. Total Hip Protocol.  Take Xarelto for two and a half more weeks, then discontinue Xarelto. Once the patient has completed the Xarelto, they may resume the 81 mg Aspirin.   Postoperative Constipation Protocol  Constipation - defined medically as fewer than three stools per week and severe constipation as less than one stool per week.  One of the most common issues  patients have following surgery is constipation.  Even if you have a regular bowel pattern at home, your normal regimen is likely to be disrupted due to multiple reasons following surgery.  Combination of anesthesia, postoperative narcotics, change in appetite and fluid intake all can affect your bowels.  In order to avoid complications following surgery, here are some recommendations in order to help you during your recovery period.  Colace (docusate) - Pick up an over-the-counter form of Colace or another stool softener and take twice a day as long as you are requiring postoperative pain medications.  Take with a full glass of water daily.  If you experience loose stools or diarrhea, hold the colace until you stool forms back up.  If your symptoms do not get better within 1 week or if they get worse, check with your doctor.  Dulcolax (bisacodyl) - Pick up over-the-counter and take as directed by the product packaging as needed to assist with the movement of your bowels.  Take with a full  glass of water.  Use this product as needed if not relieved by Colace only.   MiraLax (polyethylene glycol) - Pick up over-the-counter to have on hand.  MiraLax is a solution that will increase the amount of water in your bowels to assist with bowel movements.  Take as directed and can mix with a glass of water, juice, soda, coffee, or tea.  Take if you go more than two days without a movement. Do not use MiraLax more than once per day. Call your doctor if you are still constipated or irregular after using this medication for 7 days in a row.  If you continue to have problems with postoperative constipation, please contact the office for further assistance and recommendations.  If you experience "the worst abdominal pain ever" or develop nausea or vomiting, please contact the office immediatly for further recommendations for treatment.   When discharged from the skilled rehab facility, please have the facility set up the patient's [place] prior to being released. Also provide the patient with their medications at time of release from the facility to include their pain medication, the muscle relaxants, and their blood thinner medication.  If the patient is still at the rehab facility at time of follow up appointment, please also assist the patient in arranging follow up appointment in our office and any transportation needs. ICE to the affected knee or hip every three hours for 30 minutes at a time and then as needed for pain and swelling.    Information on my medicine - XARELTO (Rivaroxaban)  This medication education was reviewed with me or my healthcare representative as part of my discharge preparation.  The pharmacist that spoke with me during my hospital stay was:  Angela [facility]  Why was Xarelto prescribed for you? Xarelto was prescribed for you to reduce the risk of blood clots forming after orthopedic surgery. The medical term for these abnormal blood clots  is venous thromboembolism (VTE).  What do you need to know about xarelto ? Take your Xarelto ONCE DAILY at the same time every day. You may take it either with or without food.  If you have difficulty swallowing the tablet whole, you may crush it and mix in applesauce just prior to taking your dose.  Take Xarelto exactly as prescribed by your doctor and DO NOT stop taking Xarelto without talking to the doctor who prescribed the medication.  Stopping without other VTE prevention medication to take  the place of Xarelto may increase your risk of developing a clot.  After discharge, you should have regular check-up appointments with your healthcare provider that is prescribing your Xarelto.    What do you do if you miss a dose? If you miss a dose, take it as soon as you remember on the same day then continue your regularly scheduled once daily regimen the next day. Do not take two doses of Xarelto on the same day.   Important Safety Information A possible side effect of Xarelto is bleeding. You should call your healthcare provider right away if you experience any of the following: ? Bleeding from an injury or your nose that does not stop. ? Unusual colored urine (red or dark brown) or unusual colored stools (red or black). ? Unusual bruising for unknown reasons. ? A serious fall or if you hit your head (even if there is no bleeding).  Some medicines may interact with Xarelto and might increase your risk of bleeding while on Xarelto. To help avoid this, consult your healthcare provider or pharmacist prior to using any new prescription or non-prescription medications, including herbals, vitamins, non-steroidal anti-inflammatory drugs (NSAIDs) and supplements.  This website has more information on Xarelto: [email or web address]

## 2014-12-18 NOTE — Discharge Summary (Signed)
Physician Discharge Summary   Patient ID: [name] MRN: [id number] DOB/AGE: 1930/05/23 81 y.o.  Admit date: 12/17/2014 Discharge date: 12/19/2014  Primary Diagnosis:  Osteoarthritis of the Left hip.  Admission Diagnoses:  Past Medical History  Diagnosis Date  . Arthritis   . Fibromyalgia   . Skin cancer   . SUI (stress urinary incontinence, female)   . GERD (gastroesophageal reflux disease)   . HLD (hyperlipidemia)   . Osteoarthritis   . Depression   . Peripheral neuropathy   . HTN (hypertension)   . Ulcerative colitis   . Anemia   . Internal hemorrhoids   . Complication of anesthesia     "I GOT PNEUMONIA THE NEXT DAY FROM THE ANESTHESIA"  . Shortness of breath   . History of skin cancer    Discharge Diagnoses:   Active Problems:   OA (osteoarthritis) of hip  Estimated body mass index is 31.42 kg/(m^2) as calculated from the following:   Height as of this encounter: 5' 10"  (1.778 m).   Weight as of this encounter: 99.338 kg (219 lb).  Procedure(s) (LRB): LEFT TOTAL HIP ARTHROPLASTY ANTERIOR APPROACH (Left)   Consults: None  HPI: [name] is a 81 y.o. female who has advanced end-  stage arthritis of her Left hip with progressively worsening pain and  dysfunction.The patient has failed nonoperative management and presents for  total hip arthroplasty.   Laboratory Data: Admission on 12/17/2014  Component Date Value Ref Range Status  . ABO/RH(D) 12/17/2014 O NEG   Final  . Antibody Screen 12/17/2014 POS   Final  . Sample Expiration 12/17/2014 12/20/2014   Final  . Antibody Identification 22/01/5426 ANTI D ANTI C   Final  . DAT, IgG 12/17/2014 NEG   Final  . Unit Number 12/17/2014 [id number]   Final  . Blood Component Type 12/17/2014 RBC LR PHER2   Final  . Unit division 12/17/2014 00   Final  . Status of Unit 12/17/2014 ALLOCATED   Final  . Donor AG Type 12/17/2014 NEGATIVE FOR C ANTIGEN   Final  . Transfusion Status 12/17/2014 OK TO  TRANSFUSE   Final  . Crossmatch Result 12/17/2014 COMPATIBLE   Final  . Unit Number 12/17/2014 [id number]   Final  . Blood Component Type 12/17/2014 RBC LR PHER1   Final  . Unit division 12/17/2014 00   Final  . Status of Unit 12/17/2014 ALLOCATED   Final  . Donor AG Type 12/17/2014 NEGATIVE FOR C ANTIGEN   Final  . Transfusion Status 12/17/2014 OK TO TRANSFUSE   Final  . Crossmatch Result 12/17/2014 COMPATIBLE   Final  . WBC 12/18/2014 16.6* 4.0 - 10.5 K/uL Final  . RBC 12/18/2014 3.58* 3.87 - 5.11 MIL/uL Final  . Hemoglobin 12/18/2014 10.4* 12.0 - 15.0 g/dL Final  . HCT 12/18/2014 31.5* 36.0 - 46.0 % Final  . MCV 12/18/2014 88.0  78.0 - 100.0 fL Final  . MCH 12/18/2014 29.1  26.0 - 34.0 pg Final  . MCHC 12/18/2014 33.0  30.0 - 36.0 g/dL Final  . RDW 12/18/2014 15.0  11.5 - 15.5 % Final  . Platelets 12/18/2014 184  150 - 400 K/uL Final  . Sodium 12/18/2014 134* 135 - 145 mmol/L Final   Please note change in reference range.  . Potassium 12/18/2014 3.6  3.5 - 5.1 mmol/L Final   Please note change in reference range.  . Chloride 12/18/2014 100  96 - 112 mEq/L Final  . CO2 12/18/2014 26  19 -  32 mmol/L Final  . Glucose, Bld 12/18/2014 146* 70 - 99 mg/dL Final  . BUN 12/18/2014 21  6 - 23 mg/dL Final  . Creatinine, Ser 12/18/2014 0.71  0.50 - 1.10 mg/dL Final  . Calcium 12/18/2014 8.4  8.4 - 10.5 mg/dL Final  . GFR calc non Af Amer 12/18/2014 78* >90 mL/min Final  . GFR calc Af Amer 12/18/2014 >90  >90 mL/min Final   Comment: (NOTE) The eGFR has been calculated using the CKD EPI equation. This calculation has not been validated in all clinical situations. eGFR's persistently <90 mL/min signify possible Chronic Kidney Disease.   . Anion gap 12/18/2014 8  5 - 15 Final  . WBC 12/19/2014 14.1* 4.0 - 10.5 K/uL Final  . RBC 12/19/2014 3.41* 3.87 - 5.11 MIL/uL Final  . Hemoglobin 12/19/2014 9.9* 12.0 - 15.0 g/dL Final  . HCT 12/19/2014 30.0* 36.0 - 46.0 % Final  . MCV 12/19/2014  88.0  78.0 - 100.0 fL Final  . MCH 12/19/2014 29.0  26.0 - 34.0 pg Final  . MCHC 12/19/2014 33.0  30.0 - 36.0 g/dL Final  . RDW 12/19/2014 15.6* 11.5 - 15.5 % Final  . Platelets 12/19/2014 173  150 - 400 K/uL Final  . Sodium 12/19/2014 134* 135 - 145 mmol/L Final   Please note change in reference range.  . Potassium 12/19/2014 3.4* 3.5 - 5.1 mmol/L Final   Please note change in reference range.  . Chloride 12/19/2014 99  96 - 112 mEq/L Final  . CO2 12/19/2014 27  19 - 32 mmol/L Final  . Glucose, Bld 12/19/2014 117* 70 - 99 mg/dL Final  . BUN 12/19/2014 19  6 - 23 mg/dL Final  . Creatinine, Ser 12/19/2014 0.71  0.50 - 1.10 mg/dL Final  . Calcium 12/19/2014 8.5  8.4 - 10.5 mg/dL Final  . GFR calc non Af Amer 12/19/2014 78* >90 mL/min Final  . GFR calc Af Amer 12/19/2014 >90  >90 mL/min Final   Comment: (NOTE) The eGFR has been calculated using the CKD EPI equation. This calculation has not been validated in all clinical situations. eGFR's persistently <90 mL/min signify possible Chronic Kidney Disease.   [name] gap 12/19/2014 8  5 - 15 Final  Hospital Outpatient Visit on 12/11/2014  Component Date Value Ref Range Status  . aPTT 12/11/2014 27  24 - 37 seconds Final  . WBC 12/11/2014 11.6* 4.0 - 10.5 K/uL Final  . RBC 12/11/2014 4.45  3.87 - 5.11 MIL/uL Final  . Hemoglobin 12/11/2014 12.9  12.0 - 15.0 g/dL Final  . HCT 12/11/2014 39.2  36.0 - 46.0 % Final  . MCV 12/11/2014 88.1  78.0 - 100.0 fL Final  . MCH 12/11/2014 29.0  26.0 - 34.0 pg Final  . MCHC 12/11/2014 32.9  30.0 - 36.0 g/dL Final  . RDW 12/11/2014 15.2  11.5 - 15.5 % Final  . Platelets 12/11/2014 234  150 - 400 K/uL Final  . Sodium 12/11/2014 138  135 - 145 mmol/L Final   Please note change in reference range.  . Potassium 12/11/2014 3.9  3.5 - 5.1 mmol/L Final   Please note change in reference range.  . Chloride 12/11/2014 102  96 - 112 mEq/L Final  . CO2 12/11/2014 28  19 - 32 mmol/L Final  . Glucose, Bld  12/11/2014 88  70 - 99 mg/dL Final  . BUN 12/11/2014 23  6 - 23 mg/dL Final  . Creatinine, Ser 12/11/2014 0.73  0.50 - 1.10  mg/dL Final  . Calcium 12/11/2014 10.0  8.4 - 10.5 mg/dL Final  . Total Protein 12/11/2014 6.8  6.0 - 8.3 g/dL Final  . Albumin 12/11/2014 4.5  3.5 - 5.2 g/dL Final  . AST 12/11/2014 25  0 - 37 U/L Final  . ALT 12/11/2014 19  0 - 35 U/L Final  . Alkaline Phosphatase 12/11/2014 62  39 - 117 U/L Final  . Total Bilirubin 12/11/2014 0.8  0.3 - 1.2 mg/dL Final  . GFR calc non Af Amer 12/11/2014 77* >90 mL/min Final  . GFR calc Af Amer 12/11/2014 90* >90 mL/min Final   Comment: (NOTE) The eGFR has been calculated using the CKD EPI equation. This calculation has not been validated in all clinical situations. eGFR's persistently <90 mL/min signify possible Chronic Kidney Disease.   . Anion gap 12/11/2014 8  5 - 15 Final  . Prothrombin Time 12/11/2014 12.5  11.6 - 15.2 seconds Final  . INR 12/11/2014 0.92  0.00 - 1.49 Final  . Color, Urine 12/11/2014 YELLOW  YELLOW Final  . APPearance 12/11/2014 CLEAR  CLEAR Final  . Specific Gravity, Urine 12/11/2014 1.019  1.005 - 1.030 Final  . pH 12/11/2014 7.0  5.0 - 8.0 Final  . Glucose, UA 12/11/2014 NEGATIVE  NEGATIVE mg/dL Final  . Hgb urine dipstick 12/11/2014 NEGATIVE  NEGATIVE Final  . Bilirubin Urine 12/11/2014 NEGATIVE  NEGATIVE Final  . Ketones, ur 12/11/2014 NEGATIVE  NEGATIVE mg/dL Final  . Protein, ur 12/11/2014 NEGATIVE  NEGATIVE mg/dL Final  . Urobilinogen, UA 12/11/2014 0.2  0.0 - 1.0 mg/dL Final  . Nitrite 12/11/2014 NEGATIVE  NEGATIVE Final  . Leukocytes, UA 12/11/2014 TRACE* NEGATIVE Final  . MRSA, PCR 12/11/2014 NEGATIVE  NEGATIVE Final  . Staphylococcus aureus 12/11/2014 NEGATIVE  NEGATIVE Final   Comment:        The Xpert SA Assay (FDA approved for NASAL specimens in patients over 39 years of age), is one component of a comprehensive surveillance program.  Test performance has been validated by  [facility] for patients greater than or equal to 56 year old. It is not intended to diagnose infection nor to guide or monitor treatment.   . WBC, UA 12/11/2014 0-2  <3 WBC/hpf Final  . Bacteria, UA 12/11/2014 RARE  RARE Final     X-Rays:Dg Pelvis Portable  12/17/2014   CLINICAL DATA:  Postop left hip replacement.  EXAM: PORTABLE PELVIS 1-2 VIEWS  COMPARISON:  12/11/2014  FINDINGS: Sequelae of prior right total hip arthroplasty are again identified. There has been interval left total hip arthroplasty. The prosthetic femoral and acetabular components appear normally located on this single AP projection. No periprosthetic fracture is identified. Postoperative soft tissue emphysema and a surgical drain are noted adjacent to the left hip. Calcifications in the pelvis likely represent phleboliths.  IMPRESSION: Interval left total hip arthroplasty without radiographic evidence of immediate complication.   Electronically Signed   By: Logan Bores   On: 12/17/2014 16:10   Dg C-arm 1-60 Min-no Report  12/17/2014   CLINICAL DATA: hip surgery   C-ARM 1-60 MINUTES  Fluoroscopy was utilized by the requesting physician.  No radiographic  interpretation.    Dg Hip Unilat With Pelvis 2-3 Views Left  12/11/2014   CLINICAL DATA:  Preoperative evaluation LEFT total hip replacement, osteoarthritis LEFT hip  EXAM: DG HIP W/ PELVIS 2-3V*L*  COMPARISON:  07/30/2014  FINDINGS: Osseous demineralization.  Acetabular and femoral components of a RIGHT hip prosthesis are identified in expected positions  new since previous exam.  Advanced osteoarthritic changes of LEFT hip joint with joint space narrowing and spur formation as well as minimal subchondral sclerosis.  No acute fracture, dislocation or bone destruction.  Few scattered pelvic phleboliths.  Degenerative disc disease changes at visualized lower lumbar spine.  SI joints symmetric.  IMPRESSION: Advanced osteoarthritic changes LEFT hip joint.  Post RIGHT total hip  arthroplasty.   Electronically Signed   By: Lavonia Dana M.D.   On: 12/11/2014 14:30    EKG: Orders placed or performed during the hospital encounter of 07/30/14  . EKG 12-Lead  . EKG 12-Lead     Hospital Course: Patient was admitted to [facility] and taken to the OR and underwent the above state procedure without complications.  Patient tolerated the procedure well and was later transferred to the recovery room and then to the orthopaedic floor for postoperative care.  They were given PO and IV analgesics for pain control following their surgery.  They were given 24 hours of postoperative antibiotics of  Anti-infectives    Start     Dose/Rate Route Frequency Ordered Stop   12/17/14 1830  ceFAZolin (ANCEF) IVPB 2 g/50 mL premix     2 g100 mL/hr over 30 Minutes Intravenous Every 6 hours 12/17/14 1659 12/18/14 0116   12/17/14 1037  ceFAZolin (ANCEF) IVPB 2 g/50 mL premix     2 g100 mL/hr over 30 Minutes Intravenous On call to O.R. 12/17/14 1037 12/17/14 1333     and started on DVT prophylaxis in the form of Xarelto.   PT and OT were ordered for total hip protocol.  The patient was allowed to be WBAT with therapy. Discharge planning was consulted to help with postop disposition and equipment needs.  Patient had a decent night on the evening of surgery.  They started to get up OOB with therapy on day one.  Hemovac drain was pulled without difficulty.  Continued to work with therapy into day two.  Dressing was changed on day two and the incision was healing well. Patient was seen in rounds by Dr. Wynelle Link and was ready to go to [facility].  Discharge to SNF Diet - Cardiac diet Follow up - in 2 weeks Activity - WBAT Disposition - Skilled nursing facility Condition Upon Discharge - Good D/C Meds - See DC Summary DVT Prophylaxis - Xarelto      Discharge Instructions    Call MD / Call 911    Complete by:  As directed   If you experience chest pain or shortness of breath, CALL 911  and be transported to the hospital emergency room.  If you develope a fever above 101 F, pus (white drainage) or increased drainage or redness at the wound, or calf pain, call your surgeon's office.     Change dressing    Complete by:  As directed   You may change your dressing dressing daily with sterile 4 x 4 inch gauze dressing and paper tape.  Do not submerge the incision under water.     Constipation Prevention    Complete by:  As directed   Drink plenty of fluids.  Prune juice may be helpful.  You may use a stool softener, such as Colace (over the counter) 100 mg twice a day.  Use MiraLax (over the counter) for constipation as needed.     Diet - low sodium heart healthy    Complete by:  As directed      Discharge instructions  Complete by:  As directed   Pick up stool softner and laxative for home use following surgery while on pain medications. Do not submerge incision under water. Please use good hand washing techniques while changing dressing each day. May shower starting three days after surgery. Please use a clean towel to pat the incision dry following showers. Continue to use ice for pain and swelling after surgery. Do not use any lotions or creams on the incision until instructed by your surgeon. Hip precautions.   Take Xarelto for two and a half more weeks, then discontinue Xarelto. Once the patient has completed the Xarelto, they may resume the 81 mg Aspirin.  Postoperative Constipation Protocol  Constipation - defined medically as fewer than three stools per week and severe constipation as less than one stool per week.  One of the most common issues patients have following surgery is constipation.  Even if you have a regular bowel pattern at home, your normal regimen is likely to be disrupted due to multiple reasons following surgery.  Combination of anesthesia, postoperative narcotics, change in appetite and fluid intake all can affect your bowels.  In order to avoid  complications following surgery, here are some recommendations in order to help you during your recovery period.  Colace (docusate) - Pick up an over-the-counter form of Colace or another stool softener and take twice a day as long as you are requiring postoperative pain medications.  Take with a full glass of water daily.  If you experience loose stools or diarrhea, hold the colace until you stool forms back up.  If your symptoms do not get better within 1 week or if they get worse, check with your doctor.  Dulcolax (bisacodyl) - Pick up over-the-counter and take as directed by the product packaging as needed to assist with the movement of your bowels.  Take with a full glass of water.  Use this product as needed if not relieved by Colace only.   MiraLax (polyethylene glycol) - Pick up over-the-counter to have on hand.  MiraLax is a solution that will increase the amount of water in your bowels to assist with bowel movements.  Take as directed and can mix with a glass of water, juice, soda, coffee, or tea.  Take if you go more than two days without a movement. Do not use MiraLax more than once per day. Call your doctor if you are still constipated or irregular after using this medication for 7 days in a row.  If you continue to have problems with postoperative constipation, please contact the office for further assistance and recommendations.  If you experience "the worst abdominal pain ever" or develop nausea or vomiting, please contact the office immediatly for further recommendations for treatment.   When discharged from the skilled rehab facility, please have the facility set up the patient's [facility] prior to being released.   Also provide the patient with their medications at time of release from the facility to include their pain medication, the muscle relaxants, and their blood thinner medication.  If the patient is still at the rehab facility at time of follow up  appointment, please also assist the patient in arranging follow up appointment in our office and any transportation needs. ICE to the affected knee or hip every three hours for 30 minutes at a time and then as needed for pain and swelling.     Do not sit on low chairs, stoools or toilet seats, as it may be  difficult to get up from low surfaces    Complete by:  As directed      Driving restrictions    Complete by:  As directed   No driving until released by the physician.     Increase activity slowly as tolerated    Complete by:  As directed      Lifting restrictions    Complete by:  As directed   No lifting until released by the physician.     Patient may shower    Complete by:  As directed   You may shower without a dressing once there is no drainage.  Do not wash over the wound.  If drainage remains, do not shower until drainage stops.     TED hose    Complete by:  As directed   Use stockings (TED hose) for 3 weeks on both leg(s).  You may remove them at night for sleeping.     Weight bearing as tolerated    Complete by:  As directed   Laterality:  left  Extremity:  Lower            Medication List    STOP taking these medications        aspirin EC 81 MG tablet     GARCINIA CAMBOGIA-CHROMIUM PO     GREEN TEA PO     meloxicam 7.5 MG tablet  Commonly known as:  MOBIC     multivitamin with minerals Tabs tablet     vitamin C 500 MG tablet  Commonly known as:  ASCORBIC ACID      TAKE these medications        acetaminophen 325 MG tablet  Commonly known as:  TYLENOL  Take 2 tablets (650 mg total) by mouth every 6 (six) hours as needed for mild pain or headache (or Fever >/= 101).     bisacodyl 10 MG suppository  Commonly known as:  DULCOLAX  Place 1 suppository (10 mg total) rectally daily as needed for moderate constipation.     fenofibrate 48 MG tablet  Commonly known as:  TRICOR  Take 48 mg by mouth daily.     ferrous sulfate 325 (65 FE) MG tablet  Take 325  mg by mouth daily with breakfast.     hydrochlorothiazide 25 MG tablet  Commonly known as:  HYDRODIURIL  Take 25 mg by mouth every morning.     lisinopril-hydrochlorothiazide 20-25 MG per tablet  Commonly known as:  PRINZIDE,ZESTORETIC  Take 1 tablet by mouth every morning.     LUMIGAN 0.01 % Soln  Generic drug:  bimatoprost  Place 1 drop into both eyes at bedtime.     LYRICA 50 MG capsule  Generic drug:  pregabalin  Take 50 mg by mouth daily.     methocarbamol 500 MG tablet  Commonly known as:  ROBAXIN  Take 1 tablet (500 mg total) by mouth every 6 (six) hours as needed for muscle spasms.     metoCLOPramide 5 MG tablet  Commonly known as:  REGLAN  Take 1-2 tablets (5-10 mg total) by mouth every 8 (eight) hours as needed for nausea (if ondansetron (ZOFRAN) ineffective.).     omeprazole 40 MG capsule  Commonly known as:  PRILOSEC  Take 40 mg by mouth daily.     ondansetron 4 MG tablet  Commonly known as:  ZOFRAN  Take 1 tablet (4 mg total) by mouth every 6 (six) hours as needed for nausea.     oxyCODONE  5 MG immediate release tablet  Commonly known as:  Oxy IR/ROXICODONE  Take 1-2 tablets (5-10 mg total) by mouth every 3 (three) hours as needed for moderate pain, severe pain or breakthrough pain.     polyethylene glycol packet  Commonly known as:  MIRALAX / GLYCOLAX  Take 17 g by mouth daily as needed for mild constipation.     rivaroxaban 10 MG Tabs tablet  Commonly known as:  XARELTO  - Take 1 tablet (10 mg total) by mouth daily with breakfast. Take Xarelto for two and a half more weeks, then discontinue Xarelto.  - Once the patient has completed the Xarelto, they may resume the 81 mg Aspirin.     sertraline 50 MG tablet  Commonly known as:  ZOLOFT  Take 50 mg by mouth every morning.     simvastatin 20 MG tablet  Commonly known as:  ZOCOR  Take 20 mg by mouth at bedtime.     STOOL SOFTENER PO  Take 100 mg by mouth 2 (two) times daily.     traMADol 50 MG  tablet  Commonly known as:  ULTRAM  Take 1-2 tablets (50-100 mg total) by mouth every 6 (six) hours as needed (mild to moderate pain).     traZODone 100 MG tablet  Commonly known as:  DESYREL  Take 200 mg by mouth at bedtime.       Follow-up Information    Follow up with Gearlean Alf, MD. Schedule an appointment as soon as possible for a visit in 2 weeks.   Specialty:  Orthopedic Surgery   Contact information:   [address] [place] [address] [phone]       Signed: Arlee Muslim, PA-C Orthopaedic Surgery 12/19/2014, 9:49 AM

## 2014-12-18 NOTE — Progress Notes (Addendum)
Clinical Social Work Department CLINICAL SOCIAL WORK PLACEMENT NOTE 12/19/14  Patient:  [name], [name]  Account Number:  [id number] Admit date:  12/17/2014  Clinical Social Worker:  Werner Lean, LCSW  Date/time:  12/18/2014 01:08 PM  Clinical Social Work is seeking post-discharge placement for this patient at the following level of care:   SKILLED NURSING   (*CSW will update this form in Epic as items are completed)     Patient/family provided with [facility] Department of Clinical Social Work's list of facilities offering this level of care within the geographic area requested by the patient (or if unable, by the patient's family).  12/18/2014  Patient/family informed of their freedom to choose among providers that offer the needed level of care, that participate in Medicare, Medicaid or managed care program needed by the patient, have an available bed and are willing to accept the patient.    Patient/family informed of [facility]' ownership interest in [facility], as well as of the fact that they are under no obligation to receive care at this facility.  PASARR submitted to EDS on 12/17/2014 PASARR number received on 12/17/2014  FL2 transmitted to all facilities in geographic area requested by pt/family on  12/18/2014 FL2 transmitted to all facilities within larger geographic area on   Patient informed that his/her managed care company has contracts with or will negotiate with  certain facilities, including the following:     Patient/family informed of bed offers received:  12/18/2014 Patient chooses bed at [facility] Physician recommends and patient chooses bed at    Patient to be transferred to [place]  on  12/19/14 Patient to be transferred to facility by family Patient and family notified of transfer on 12/19/14 Name of family member notified:  Daughter in Sports coach at bedside  The following physician request were entered in Epic:   Additional  Comments:  Eduard Clos, MSW, [place]

## 2014-12-18 NOTE — Progress Notes (Signed)
Physical Therapy Treatment Patient Details Name: [name] MRN: [id number] DOB: 17-Sep-1930 Today's Date: 2014/12/30    History of Present Illness L THR - s/p R THR 9/15    PT Comments      Follow Up Recommendations  SNF     Equipment Recommendations  None recommended by PT    Recommendations for Other Services OT consult     Precautions / Restrictions Precautions Precautions: Fall Restrictions Weight Bearing Restrictions: No Other Position/Activity Restrictions: WBAT    Mobility  Bed Mobility               General bed mobility comments: NT - pt in bed and agreeable to therex but requests defer amb  Transfers                    Ambulation/Gait                 Stairs            Wheelchair Mobility    Modified Rankin (Stroke Patients Only)       Balance                                    Cognition Arousal/Alertness: Awake/alert Behavior During Therapy: WFL for tasks assessed/performed Overall Cognitive Status: Within Functional Limits for tasks assessed                      Exercises Total Joint Exercises Ankle Circles/Pumps: AROM;Both;15 reps;Supine Quad Sets: AROM;Both;10 reps;Supine Heel Slides: AAROM;Supine;15 reps;Left Hip ABduction/ADduction: AAROM;Left;15 reps;Supine    General Comments        Pertinent Vitals/Pain Pain Assessment: 0-10 Pain Score: 6  Pain Location: L hip/thigh Pain Descriptors / Indicators: Aching;Sore Pain Intervention(s): Limited activity within patient's tolerance;Monitored during session;Premedicated before session;Ice applied    Home Living                      Prior Function            PT Goals (current goals can now be found in the care plan section) Acute Rehab PT Goals Patient Stated Goal: Rehab and then home PT Goal Formulation: With patient Time For Goal Achievement: 12/25/14 Potential to Achieve Goals: Good Progress towards PT  goals: Progressing toward goals    Frequency  7X/week    PT Plan Current plan remains appropriate    Co-evaluation             End of Session Equipment Utilized During Treatment: Gait belt Activity Tolerance: Patient tolerated treatment well Patient left: in bed;with call bell/phone within reach     Time: 1411-1427 PT Time Calculation (min) (ACUTE ONLY): 16 min  Charges:  $Therapeutic Exercise: 8-22 mins                    G Codes:      Salene Mohamud 2014/12/30, 4:13 PM

## 2014-12-18 NOTE — Progress Notes (Signed)
Hemovac called out during the activity of getting up on side of bed. No bleeding noted.

## 2014-12-18 NOTE — Evaluation (Signed)
Physical Therapy Evaluation Patient Details Name: [name] MRN: [id number] DOB: 25-Nov-1930 Today's Date: 12/18/2014   History of Present Illness  L THR - s/p R THR 9/15  Clinical Impression  Pt s/p L THR presents with decreased L LE strength/ROM and post op pain limiting functional mobility.  Pt will benefit from follow up SNF level rehab to maximize IND and safety prior to return home alone with ltd assist    Follow Up Recommendations SNF    Equipment Recommendations  None recommended by PT    Recommendations for Other Services OT consult     Precautions / Restrictions Precautions Precautions: Fall Restrictions Weight Bearing Restrictions: No Other Position/Activity Restrictions: WBAT      Mobility  Bed Mobility               General bed mobility comments: NT - pt OOB with nursing  Transfers Overall transfer level: Needs assistance Equipment used: Rolling walker (2 wheeled) Transfers: Sit to/from Stand Sit to Stand: Min assist         General transfer comment: cues for LE management and use of UEs to self assist  Ambulation/Gait Ambulation/Gait assistance: Min assist Ambulation Distance (Feet): 125 Feet (twice) Assistive device: Rolling walker (2 wheeled) Gait Pattern/deviations: Step-to pattern;Step-through pattern;Decreased step length - right;Decreased step length - left;Shuffle     General Gait Details: cues for posture, position from RW, and to correct excessive ER on L  Stairs            Wheelchair Mobility    Modified Rankin (Stroke Patients Only)       Balance                                             Pertinent Vitals/Pain Pain Assessment: 0-10 Pain Score: 5  Pain Location: L hip/thigh Pain Descriptors / Indicators: Aching;Burning Pain Intervention(s): Limited activity within patient's tolerance;Monitored during session;Premedicated before session;Ice applied    Home Living Family/patient expects  to be discharged to:: Skilled nursing facility Living Arrangements: Alone Available Help at Discharge: Family Type of Home: House Home Access: Stairs to enter;Ramped entrance Entrance Stairs-Rails: Can reach both   Home Layout: Able to live on main level with bedroom/bathroom;Two level Home Equipment: Walker - 2 wheels;Cane - quad      Prior Function Level of Independence: Independent with assistive device(s);Independent               Hand Dominance        Extremity/Trunk Assessment   Upper Extremity Assessment: Overall WFL for tasks assessed           Lower Extremity Assessment: LLE deficits/detail      Cervical / Trunk Assessment: Normal  Communication   Communication: No difficulties  Cognition Arousal/Alertness: Awake/alert Behavior During Therapy: WFL for tasks assessed/performed Overall Cognitive Status: Within Functional Limits for tasks assessed                      General Comments      Exercises        Assessment/Plan    PT Assessment Patient needs continued PT services  PT Diagnosis Difficulty walking   PT Problem List Decreased strength;Decreased range of motion;Decreased activity tolerance;Decreased mobility;Decreased knowledge of use of DME;Pain  PT Treatment Interventions DME instruction;Gait training;Stair training;Functional mobility training;Therapeutic activities;Therapeutic exercise;Patient/family education   PT Goals (Current goals  can be found in the Care Plan section) Acute Rehab PT Goals Patient Stated Goal: Rehab and then home PT Goal Formulation: With patient Time For Goal Achievement: 12/25/14 Potential to Achieve Goals: Good    Frequency 7X/week   Barriers to discharge        Co-evaluation               End of Session Equipment Utilized During Treatment: Gait belt Activity Tolerance: Patient tolerated treatment well Patient left: in chair;with call bell/phone within reach;with family/visitor  present Nurse Communication: Mobility status         Time: 1055-1110 PT Time Calculation (min) (ACUTE ONLY): 15 min   Charges:   PT Evaluation $Initial PT Evaluation Tier I: 1 Procedure     PT G Codes:        Tymere Depuy 2015/01/10, 12:48 PM

## 2014-12-18 NOTE — Progress Notes (Signed)
   Subjective: 1 Day Post-Op Procedure(s) (LRB): LEFT TOTAL HIP ARTHROPLASTY ANTERIOR APPROACH (Left) Patient reports pain as mild.   Patient seen in rounds with Dr. Wynelle Link. Doing well this morning.  No complaints. Patient is well, and has had no acute complaints or problems We will start therapy today.  Plan is to go to [facility] after hospital stay.  Objective: Vital signs in last 24 hours: Temp:  [97 F (36.1 C)-98.5 F (36.9 C)] 98.3 F (36.8 C) (01/14 0526) Pulse Rate:  [60-88] 75 (01/14 0526) Resp:  [15-18] 16 (01/14 0800) BP: (104-143)/(42-68) 104/42 mmHg (01/14 0526) SpO2:  [90 %-100 %] 94 % (01/14 0526) Weight:  [99.338 kg (219 lb)] 99.338 kg (219 lb) (01/13 1038)  Intake/Output from previous day:  Intake/Output Summary (Last 24 hours) at 12/18/14 0959 Last data filed at 12/18/14 0700  Gross per 24 hour  Intake 3713.75 ml  Output   2835 ml  Net 878.75 ml    Intake/Output this shift:    Labs:  Recent Labs  12/18/14 0417  HGB 10.4*    Recent Labs  12/18/14 0417  WBC 16.6*  RBC 3.58*  HCT 31.5*  PLT 184    Recent Labs  12/18/14 0417  NA 134*  K 3.6  CL 100  CO2 26  BUN 21  CREATININE 0.71  GLUCOSE 146*  CALCIUM 8.4   No results for input(s): LABPT, INR in the last 72 hours.  EXAM General - Patient is Alert, Appropriate and Oriented Extremity - Neurovascular intact Sensation intact distally Dressing - dressing C/D/I Motor Function - intact, moving foot and toes well on exam.  Hemovac pulled without difficulty.  Past Medical History  Diagnosis Date  . Arthritis   . Fibromyalgia   . Skin cancer   . SUI (stress urinary incontinence, female)   . GERD (gastroesophageal reflux disease)   . HLD (hyperlipidemia)   . Osteoarthritis   . Depression   . Peripheral neuropathy   . HTN (hypertension)   . Ulcerative colitis   . Anemia   . Internal hemorrhoids   . Complication of anesthesia     "I GOT PNEUMONIA THE NEXT DAY FROM THE  ANESTHESIA"  . Shortness of breath   . History of skin cancer     Assessment/Plan: 1 Day Post-Op Procedure(s) (LRB): LEFT TOTAL HIP ARTHROPLASTY ANTERIOR APPROACH (Left) Active Problems:   OA (osteoarthritis) of hip  Estimated body mass index is 31.42 kg/(m^2) as calculated from the following:   Height as of this encounter: 5\' 10"  (1.778 m).   Weight as of this encounter: 99.338 kg (219 lb). Up with therapy Discharge to SNF  DVT Prophylaxis - Xarelto Weight Bearing As Tolerated left Leg Hemovac Pulled Begin Therapy  Arlee Muslim, PA-C Orthopaedic Surgery 12/18/2014, 9:59 AM

## 2014-12-18 NOTE — Care Management Note (Signed)
    Page 1 of 1   12/18/2014     1:27:50 PM CARE MANAGEMENT NOTE 12/18/2014  Patient:  [name], [name]   Account Number:  [id number]  Date Initiated:  12/18/2014  Documentation initiated by:  [facility]  Subjective/Objective Assessment:   adm: LEFT TOTAL HIP ARTHROPLASTY ANTERIOR APPROACH (Left)     Action/Plan:   SNF   Anticipated DC Date:  12/19/2014   Anticipated DC Plan:  [place]  CM consult      Choice offered to / List presented to:             Status of service:  Completed, signed off Medicare Important Message given?   (If response is "NO", the following Medicare IM given date fields will be blank) Date Medicare IM given:   Medicare IM given by:   Date Additional Medicare IM given:   Additional Medicare IM given by:    Discharge Disposition:  [place]  Per UR Regulation:    If discussed at Long Length of Stay Meetings, dates discussed:    Comments:  12/18/14 13:25 CM notes pt to go to SNF; CSW to arrange.  No other CM needs were communicated.  Mariane Masters, BSN, CM [phone].

## 2014-12-18 NOTE — Progress Notes (Signed)
OT Cancellation Note  Patient Details Name: [name] MRN: [id number] DOB: 23-Nov-1930   Cancelled Treatment:    Noted plan of SNF- will defer OT education to SNF at [facility]. Betsy Pries 12/18/2014, 10:17 AM

## 2014-12-19 DIAGNOSIS — K5900 Constipation, unspecified: Secondary | ICD-10-CM | POA: Insufficient documentation

## 2014-12-19 DIAGNOSIS — K648 Other hemorrhoids: Secondary | ICD-10-CM | POA: Insufficient documentation

## 2014-12-19 DIAGNOSIS — G629 Polyneuropathy, unspecified: Secondary | ICD-10-CM | POA: Insufficient documentation

## 2014-12-19 DIAGNOSIS — M797 Fibromyalgia: Secondary | ICD-10-CM | POA: Insufficient documentation

## 2014-12-19 DIAGNOSIS — G894 Chronic pain syndrome: Secondary | ICD-10-CM | POA: Insufficient documentation

## 2014-12-19 LAB — CBC
HEMATOCRIT: 30 % — AB (ref 36.0–46.0)
Hemoglobin: 9.9 g/dL — ABNORMAL LOW (ref 12.0–15.0)
MCH: 29 pg (ref 26.0–34.0)
MCHC: 33 g/dL (ref 30.0–36.0)
MCV: 88 fL (ref 78.0–100.0)
Platelets: 173 10*3/uL (ref 150–400)
RBC: 3.41 MIL/uL — ABNORMAL LOW (ref 3.87–5.11)
RDW: 15.6 % — ABNORMAL HIGH (ref 11.5–15.5)
WBC: 14.1 10*3/uL — AB (ref 4.0–10.5)

## 2014-12-19 LAB — BASIC METABOLIC PANEL
Anion gap: 8 (ref 5–15)
BUN: 19 mg/dL (ref 6–23)
CALCIUM: 8.5 mg/dL (ref 8.4–10.5)
CO2: 27 mmol/L (ref 19–32)
Chloride: 99 mEq/L (ref 96–112)
Creatinine, Ser: 0.71 mg/dL (ref 0.50–1.10)
GFR calc Af Amer: >90 mL/min (ref >90)
GFR calc non Af Amer: 78 mL/min — ABNORMAL LOW (ref >90)
Glucose, Bld: 117 mg/dL — ABNORMAL HIGH (ref 70–99)
Potassium: 3.4 mmol/L — ABNORMAL LOW (ref 3.5–5.1)
SODIUM: 134 mmol/L — AB (ref 135–145)

## 2014-12-19 NOTE — Progress Notes (Signed)
Pt d/c to [facility]. Report given to facility.

## 2014-12-19 NOTE — Progress Notes (Signed)
   Subjective: 2 Days Post-Op Procedure(s) (LRB): LEFT TOTAL HIP ARTHROPLASTY ANTERIOR APPROACH (Left) Patient reports pain as mild.   Patient seen in rounds by Dr. Wynelle Link. Patient is well, but has had some minor complaints of pain in the hip, requiring pain medications Patient is ready to go to [facility] today.  Objective: Vital signs in last 24 hours: Temp:  [97.7 F (36.5 C)-101.1 F (38.4 C)] 101.1 F (38.4 C) (01/15 0524) Pulse Rate:  [86-94] 88 (01/15 0524) Resp:  [15-18] 18 (01/15 0800) BP: (128-147)/(52-75) 138/52 mmHg (01/15 0524) SpO2:  [91 %-100 %] 95 % (01/15 0524) FiO2 (%):  [100 %] 100 % (01/14 2155)  Intake/Output from previous day:  Intake/Output Summary (Last 24 hours) at 12/19/14 0947 Last data filed at 12/19/14 0855  Gross per 24 hour  Intake 1162.92 ml  Output   1525 ml  Net -362.08 ml    Intake/Output this shift: Total I/O In: 240 [P.O.:240] Out: -   Labs:  Recent Labs  12/18/14 0417 12/19/14 0438  HGB 10.4* 9.9*    Recent Labs  12/18/14 0417 12/19/14 0438  WBC 16.6* 14.1*  RBC 3.58* 3.41*  HCT 31.5* 30.0*  PLT 184 173    Recent Labs  12/18/14 0417 12/19/14 0438  NA 134* 134*  K 3.6 3.4*  CL 100 99  CO2 26 27  BUN 21 19  CREATININE 0.71 0.71  GLUCOSE 146* 117*  CALCIUM 8.4 8.5   No results for input(s): LABPT, INR in the last 72 hours.  EXAM: General - Patient is Alert, Appropriate and Oriented Extremity - Neurovascular intact Sensation intact distally Dorsiflexion/Plantar flexion intact Incision - clean, dry, no drainage Motor Function - intact, moving foot and toes well on exam.   Assessment/Plan: 2 Days Post-Op Procedure(s) (LRB): LEFT TOTAL HIP ARTHROPLASTY ANTERIOR APPROACH (Left) Procedure(s) (LRB): LEFT TOTAL HIP ARTHROPLASTY ANTERIOR APPROACH (Left) Past Medical History  Diagnosis Date  . Arthritis   . Fibromyalgia   . Skin cancer   . SUI (stress urinary incontinence, female)   . GERD  (gastroesophageal reflux disease)   . HLD (hyperlipidemia)   . Osteoarthritis   . Depression   . Peripheral neuropathy   . HTN (hypertension)   . Ulcerative colitis   . Anemia   . Internal hemorrhoids   . Complication of anesthesia     "I GOT PNEUMONIA THE NEXT DAY FROM THE ANESTHESIA"  . Shortness of breath   . History of skin cancer    Active Problems:   OA (osteoarthritis) of hip  Estimated body mass index is 31.42 kg/(m^2) as calculated from the following:   Height as of this encounter: 5\' 10"  (1.778 m).   Weight as of this encounter: 99.338 kg (219 lb). Up with therapy Discharge to SNF Diet - Cardiac diet Follow up - in 2 weeks Activity - WBAT Disposition - Skilled nursing facility Condition Upon Discharge - Good D/C Meds - See DC Summary DVT Prophylaxis - Xarelto  Arlee Muslim, PA-C Orthopaedic Surgery 12/19/2014, 9:47 AM   \

## 2014-12-19 NOTE — Clinical Social Work Note (Signed)
Patient for d/c today to SNF bed at  [facility]. Patient dressed and eager to go-  Family and patient agreeable to this plan- plan transfer via EMS. Eduard Clos, MSW, [place]

## 2014-12-19 NOTE — Progress Notes (Signed)
Physical Therapy Treatment Patient Details Name: [name] MRN: [id number] DOB: [birth year] Today's Date: 12/19/2014    History of Present Illness L THR - s/p R THR 9/15    PT Comments    POD # 2 am session.  Assisted pt OOB to amb to BR.  Assisted in BR then amb a limited distance in hallway.  Pt c/o mild fatigue and "soreness".  Assisted back to bed per pt request to rest before D/C [facility].    Follow Up Recommendations  SNF ([facility])     Equipment Recommendations       Recommendations for Other Services       Precautions / Restrictions Precautions Precautions: Fall Restrictions Weight Bearing Restrictions: No Other Position/Activity Restrictions: WBAT    Mobility  Bed Mobility Overal bed mobility: Needs Assistance Bed Mobility: Supine to Sit;Sit to Supine     Supine to sit: Min assist Sit to supine: Min assist   General bed mobility comments: Min Assist L LE only plus increased time  Transfers Overall transfer level: Needs assistance Equipment used: Rolling walker (2 wheeled) Transfers: Sit to/from Stand Sit to Stand: Supervision;Min guard         General transfer comment: one VC for hasnd placement with stand to sit and to extend l LE  Ambulation/Gait Ambulation/Gait assistance: Supervision;Min guard Ambulation Distance (Feet): 135 Feet Assistive device: Rolling walker (2 wheeled) Gait Pattern/deviations: Step-through pattern;Decreased stride length;Trunk flexed Gait velocity: decreased but functional   General Gait Details: <25% VC's on safety with turns and backward gait   Stairs            Wheelchair Mobility    Modified Rankin (Stroke Patients Only)       Balance                                    Cognition Arousal/Alertness: Awake/alert Behavior During Therapy: WFL for tasks assessed/performed Overall Cognitive Status: Within Functional Limits for tasks assessed                      Exercises      General Comments        Pertinent Vitals/Pain Pain Assessment: 0-10 Pain Score: 3  Pain Location: L hip Pain Descriptors / Indicators: Sore Pain Intervention(s): Monitored during session;Premedicated before session;Repositioned    Home Living                      Prior Function            PT Goals (current goals can now be found in the care plan section) Progress towards PT goals: Progressing toward goals    Frequency  7X/week    PT Plan      Co-evaluation             End of Session Equipment Utilized During Treatment: Gait belt Activity Tolerance: Patient tolerated treatment well Patient left: in bed;with call bell/phone within reach     Time: 1100-1124 PT Time Calculation (min) (ACUTE ONLY): 24 min  Charges:  $Gait Training: 8-22 mins $Therapeutic Activity: 8-22 mins                    G Codes:      Rica Koyanagi  PTA [facility]  Acute  Rehab Pager      [phone]

## 2014-12-21 LAB — TYPE AND SCREEN
ABO/RH(D): O NEG
Antibody Screen: POSITIVE
DAT, IGG: NEGATIVE
DONOR AG TYPE: NEGATIVE
Donor AG Type: NEGATIVE
UNIT DIVISION: 0
Unit division: 0

## 2015-07-31 ENCOUNTER — Encounter (HOSPITAL_COMMUNITY): Payer: Self-pay | Admitting: Emergency Medicine

## 2015-07-31 ENCOUNTER — Emergency Department (HOSPITAL_COMMUNITY): Payer: Medicare Other

## 2015-07-31 ENCOUNTER — Emergency Department (HOSPITAL_COMMUNITY)
Admission: EM | Admit: 2015-07-31 | Discharge: 2015-07-31 | Disposition: A | Discharge status: Home / Self Care | Payer: Medicare Other | Attending: Emergency Medicine | Admitting: Emergency Medicine

## 2015-07-31 DIAGNOSIS — F329 Major depressive disorder, single episode, unspecified: Secondary | ICD-10-CM | POA: Diagnosis not present

## 2015-07-31 DIAGNOSIS — K219 Gastro-esophageal reflux disease without esophagitis: Secondary | ICD-10-CM | POA: Insufficient documentation

## 2015-07-31 DIAGNOSIS — M1990 Unspecified osteoarthritis, unspecified site: Secondary | ICD-10-CM | POA: Diagnosis not present

## 2015-07-31 DIAGNOSIS — D649 Anemia, unspecified: Secondary | ICD-10-CM | POA: Insufficient documentation

## 2015-07-31 DIAGNOSIS — R0602 Shortness of breath: Secondary | ICD-10-CM | POA: Insufficient documentation

## 2015-07-31 DIAGNOSIS — Z87448 Personal history of other diseases of urinary system: Secondary | ICD-10-CM | POA: Diagnosis not present

## 2015-07-31 DIAGNOSIS — G629 Polyneuropathy, unspecified: Secondary | ICD-10-CM | POA: Insufficient documentation

## 2015-07-31 DIAGNOSIS — E785 Hyperlipidemia, unspecified: Secondary | ICD-10-CM | POA: Diagnosis not present

## 2015-07-31 DIAGNOSIS — M797 Fibromyalgia: Secondary | ICD-10-CM | POA: Diagnosis not present

## 2015-07-31 DIAGNOSIS — R079 Chest pain, unspecified: Secondary | ICD-10-CM | POA: Diagnosis not present

## 2015-07-31 DIAGNOSIS — Z79899 Other long term (current) drug therapy: Secondary | ICD-10-CM | POA: Insufficient documentation

## 2015-07-31 DIAGNOSIS — I10 Essential (primary) hypertension: Secondary | ICD-10-CM | POA: Diagnosis not present

## 2015-07-31 DIAGNOSIS — Z85828 Personal history of other malignant neoplasm of skin: Secondary | ICD-10-CM | POA: Diagnosis not present

## 2015-07-31 LAB — TROPONIN I: Troponin I: <0.03 ng/mL (ref <0.031)

## 2015-07-31 LAB — COMPREHENSIVE METABOLIC PANEL
ALBUMIN: 4.1 g/dL (ref 3.5–5.0)
ALT: 21 U/L (ref 14–54)
AST: 32 U/L (ref 15–41)
Alkaline Phosphatase: 73 U/L (ref 38–126)
Anion gap: 9 (ref 5–15)
BUN: 16 mg/dL (ref 6–20)
CHLORIDE: 103 mmol/L (ref 101–111)
CO2: 26 mmol/L (ref 22–32)
Calcium: 9.4 mg/dL (ref 8.9–10.3)
Creatinine, Ser: 0.79 mg/dL (ref 0.44–1.00)
GFR calc non Af Amer: >60 mL/min (ref >60)
GLUCOSE: 120 mg/dL — AB (ref 65–99)
Potassium: 3.6 mmol/L (ref 3.5–5.1)
SODIUM: 138 mmol/L (ref 135–145)
Total Bilirubin: 0.7 mg/dL (ref 0.3–1.2)
Total Protein: 6.6 g/dL (ref 6.5–8.1)

## 2015-07-31 LAB — CBC WITH DIFFERENTIAL/PLATELET
BASOS PCT: 0 % (ref 0–1)
Basophils Absolute: 0 10*3/uL (ref 0.0–0.1)
EOS ABS: 0.3 10*3/uL (ref 0.0–0.7)
EOS PCT: 3 % (ref 0–5)
HCT: 37.4 % (ref 36.0–46.0)
HEMOGLOBIN: 12.1 g/dL (ref 12.0–15.0)
LYMPHS ABS: 3.4 10*3/uL (ref 0.7–4.0)
Lymphocytes Relative: 37 % (ref 12–46)
MCH: 28.5 pg (ref 26.0–34.0)
MCHC: 32.4 g/dL (ref 30.0–36.0)
MCV: 88 fL (ref 78.0–100.0)
Monocytes Absolute: 0.5 10*3/uL (ref 0.1–1.0)
Monocytes Relative: 5 % (ref 3–12)
NEUTROS PCT: 55 % (ref 43–77)
Neutro Abs: 5.1 10*3/uL (ref 1.7–7.7)
PLATELETS: 244 10*3/uL (ref 150–400)
RBC: 4.25 MIL/uL (ref 3.87–5.11)
RDW: 15.2 % (ref 11.5–15.5)
WBC: 9.2 10*3/uL (ref 4.0–10.5)

## 2015-07-31 LAB — BRAIN NATRIURETIC PEPTIDE: B Natriuretic Peptide: 33.4 pg/mL (ref 0.0–100.0)

## 2015-07-31 NOTE — ED Notes (Signed)
;  pt c/o left sided chest pain that started this am around 0830.  Pt states that she isnt sure what she was doing when they pain started.  Pt states that she took muscle relaxer and still no relief.  Pt states that pain radiated down left arm.

## 2015-07-31 NOTE — ED Provider Notes (Signed)
CSN: [id number]     Arrival date & time 07/31/15  1605 History   First MD Initiated Contact with Patient 07/31/15 1607     Chief Complaint  Patient presents with  . Chest Pain     (Consider location/radiation/quality/duration/timing/severity/associated sxs/prior Treatment) HPI Comments: Patient presents to the emergency for evaluation of chest pain. Patient reports that the pain has been present since 8:30 this morning. Patient reports a sharp pain in the left side of her chest that worsens when she moves. She thought it was a pulled muscle, took a muscle relaxer but it did not help. Patient does report some mild shortness of breath. When the pain worsens it "takes her breath away". No nausea, diaphoresis.  Patient is a 81 y.o. female presenting with chest pain.  Chest Pain Associated symptoms: shortness of breath     Past Medical History  Diagnosis Date  . Arthritis   . Fibromyalgia   . Skin cancer   . SUI (stress urinary incontinence, female)   . GERD (gastroesophageal reflux disease)   . HLD (hyperlipidemia)   . Osteoarthritis   . Depression   . Peripheral neuropathy   . HTN (hypertension)   . Ulcerative colitis   . Anemia   . Internal hemorrhoids   . Complication of anesthesia     "I GOT PNEUMONIA THE NEXT DAY FROM THE ANESTHESIA"  . Shortness of breath   . History of skin cancer    Past Surgical History  Procedure Laterality Date  . Breast excisional biopsy Left 08/10/2011     Dr Margot Chimes  . Breast surgery    . Carpal tunnel release Left   . Cesarean section    . Cholecystectomy    . Abdominal hysterectomy    . Colonoscopy  04/29/2003    internal hemorrhoids  . Appendectomy    . Joint replacement Bilateral 2010 and 2012    KNEES  . Total hip arthroplasty Right 08/06/2014    Procedure: RIGHT TOTAL HIP ARTHROPLASTY ANTERIOR APPROACH;  Surgeon: Gearlean Alf, MD;  Location: [facility] ORS;  Service: Orthopedics;  Laterality: Right;  . Total hip arthroplasty Left 12/17/2014     Procedure: LEFT TOTAL HIP ARTHROPLASTY ANTERIOR APPROACH;  Surgeon: Gearlean Alf, MD;  Location: [facility] ORS;  Service: Orthopedics;  Laterality: Left;   Family History  Problem Relation Age of Onset  . Colon cancer Mother 27  . Diabetes Brother    Social History  Substance Use Topics  . Smoking status: Never Smoker   . Smokeless tobacco: Never Used  . Alcohol Use: No   OB History    No data available     Review of Systems  Respiratory: Positive for shortness of breath.   Cardiovascular: Positive for chest pain.  All other systems reviewed and are negative.     Allergies  Procaine and Epinephrine  Home Medications   Prior to Admission medications   Medication Sig Start Date End Date Taking? Authorizing Provider  acetaminophen (TYLENOL) 325 MG tablet Take 2 tablets (650 mg total) by mouth every 6 (six) hours as needed for mild pain or headache (or Fever >/= 101). 08/08/14  Yes Arlee Muslim, PA-C  bimatoprost (LUMIGAN) 0.01 % SOLN Place 1 drop into both eyes at bedtime.   Yes Historical Provider, MD  clindamycin (CLEOCIN) 150 MG capsule Take 600 mg by mouth as directed. Once 2 hours before dental surgery   Yes Historical Provider, MD  Docusate Calcium (STOOL SOFTENER PO) Take 100 mg by mouth  2 (two) times daily.    Yes Historical Provider, MD  estradiol (CLIMARA - DOSED IN MG/24 HR) 0.05 mg/24hr patch Place 1 patch onto the skin once a week. Changes every thursday 06/02/15  Yes Historical Provider, MD  ferrous sulfate 325 (65 FE) MG tablet Take 325 mg by mouth daily with breakfast.   Yes Historical Provider, MD  gabapentin (NEURONTIN) 100 MG capsule Take 200 mg by mouth at bedtime.  05/27/15  Yes Historical Provider, MD  hydrochlorothiazide (HYDRODIURIL) 25 MG tablet Take 25 mg by mouth every morning.  02/03/14  Yes Historical Provider, MD  lisinopril-hydrochlorothiazide (PRINZIDE,ZESTORETIC) 20-25 MG per tablet Take 1 tablet by mouth every morning.   Yes Historical Provider, MD   methocarbamol (ROBAXIN) 500 MG tablet Take 1 tablet (500 mg total) by mouth every 6 (six) hours as needed for muscle spasms. 12/18/14  Yes Arlee Muslim, PA-C  omeprazole (PRILOSEC) 40 MG capsule Take 40 mg by mouth daily.   Yes Historical Provider, MD  sertraline (ZOLOFT) 50 MG tablet Take 50 mg by mouth every morning.    Yes Historical Provider, MD  simvastatin (ZOCOR) 20 MG tablet Take 20 mg by mouth at bedtime.    Yes Historical Provider, MD  traMADol (ULTRAM) 50 MG tablet Take 1-2 tablets (50-100 mg total) by mouth every 6 (six) hours as needed (mild to moderate pain). 12/18/14  Yes Arlee Muslim, PA-C  traZODone (DESYREL) 100 MG tablet Take 200 mg by mouth at bedtime.    Yes Historical Provider, MD   BP 122/58 mmHg  Pulse 79  Temp(Src) 98.2 F (36.8 C) (Oral)  Resp 18  SpO2 95% Physical Exam  Constitutional: She is oriented to person, place, and time. She appears well-developed and well-nourished. No distress.  HENT:  Head: Normocephalic and atraumatic.  Right Ear: Hearing normal.  Left Ear: Hearing normal.  Nose: Nose normal.  Mouth/Throat: Oropharynx is clear and moist and mucous membranes are normal.  Eyes: Conjunctivae and EOM are normal. Pupils are equal, round, and reactive to light.  Neck: Normal range of motion. Neck supple.  Cardiovascular: Regular rhythm, S1 normal and S2 normal.  Exam reveals no gallop and no friction rub.   No murmur heard. Pulmonary/Chest: Effort normal and breath sounds normal. No respiratory distress. She exhibits no tenderness.  Abdominal: Soft. Normal appearance and bowel sounds are normal. There is no hepatosplenomegaly. There is no tenderness. There is no rebound, no guarding, no tenderness at McBurney's point and negative Murphy's sign. No hernia.  Musculoskeletal: Normal range of motion.  Neurological: She is alert and oriented to person, place, and time. She has normal strength. No cranial nerve deficit or sensory deficit. Coordination normal.  GCS eye subscore is 4. GCS verbal subscore is 5. GCS motor subscore is 6.  Skin: Skin is warm, dry and intact. No rash noted. No cyanosis.  Psychiatric: She has a normal mood and affect. Her speech is normal and behavior is normal. Thought content normal.  Nursing note and vitals reviewed.   ED Course  Procedures (including critical care time) Labs Review Labs Reviewed  COMPREHENSIVE METABOLIC PANEL - Abnormal; Notable for the following:    Glucose, Bld 120 (*)    All other components within normal limits  CBC WITH DIFFERENTIAL/PLATELET  TROPONIN I  BRAIN NATRIURETIC PEPTIDE    Imaging Review Dg Chest 2 View  07/31/2015   CLINICAL DATA:  Left chest pain. Shortness of breath and hypertension.  EXAM: CHEST  2 VIEW  COMPARISON:  07/30/2014  FINDINGS: The heart size and mediastinal contours are within normal limits. Aortic atherosclerosis noted. Both lungs are clear. The visualized skeletal structures are unremarkable.  IMPRESSION: 1. No acute findings. 2. Aortic atherosclerosis   Electronically Signed   By: Kerby Moors M.D.   On: 07/31/2015 16:58   I have personally reviewed and evaluated these images and lab results as part of my medical decision-making.   EKG Interpretation None      ED ECG REPORT   Date: 07/31/2015  Rate: 83  Rhythm: normal sinus rhythm  QRS Axis: normal  Intervals: normal  ST/T Wave abnormalities: normal  Conduction Disutrbances:none  Narrative Interpretation: Normal ECG   I have personally reviewed the EKG tracing and agree with the computerized printout as noted.   MDM   Final diagnoses:  Chest pain, unspecified chest pain type    Patient presents to the ER for evaluation of chest pain. Patient has been experiencing pain for the majority of the day. Pain has been present for nearly 8 hours at time of arrival. Pain is positional in nature, not related to exertion. She has reproduction of the pain with bending and twisting. This is most consistent  with musculoskeletal pain. She has a normal EKG. Troponin is negative, 8 hours into the process. This is reassuring. Patient has minimal cardiac risk factors. She is not short of breath, hypoxic, tachycardic. Doubt PE. Pain has resolved slowly in the ER without intervention. Patient is appropriate for discharge, follow-up with PCP. She was counseled to return if her symptoms worsen.    Orpah Greek, MD 07/31/15 [phone]

## 2015-07-31 NOTE — ED Notes (Signed)
Patient transported to X-ray 

## 2015-07-31 NOTE — Discharge Instructions (Signed)

## 2015-07-31 NOTE — ED Notes (Signed)
Pt returned from Xray in no acute distress

## 2015-08-27 ENCOUNTER — Other Ambulatory Visit: Payer: Self-pay | Admitting: Physician Assistant

## 2015-11-22 IMAGING — CR DG HIP COMPLETE 2+V*R*
3 series · 3 of 3 positions shown · non-contrast
Comparison: None.

CLINICAL DATA: Preoperative for right hip replacement

EXAM:
RIGHT HIP - COMPLETE 2+ VIEW

[t pelvis a.p.]
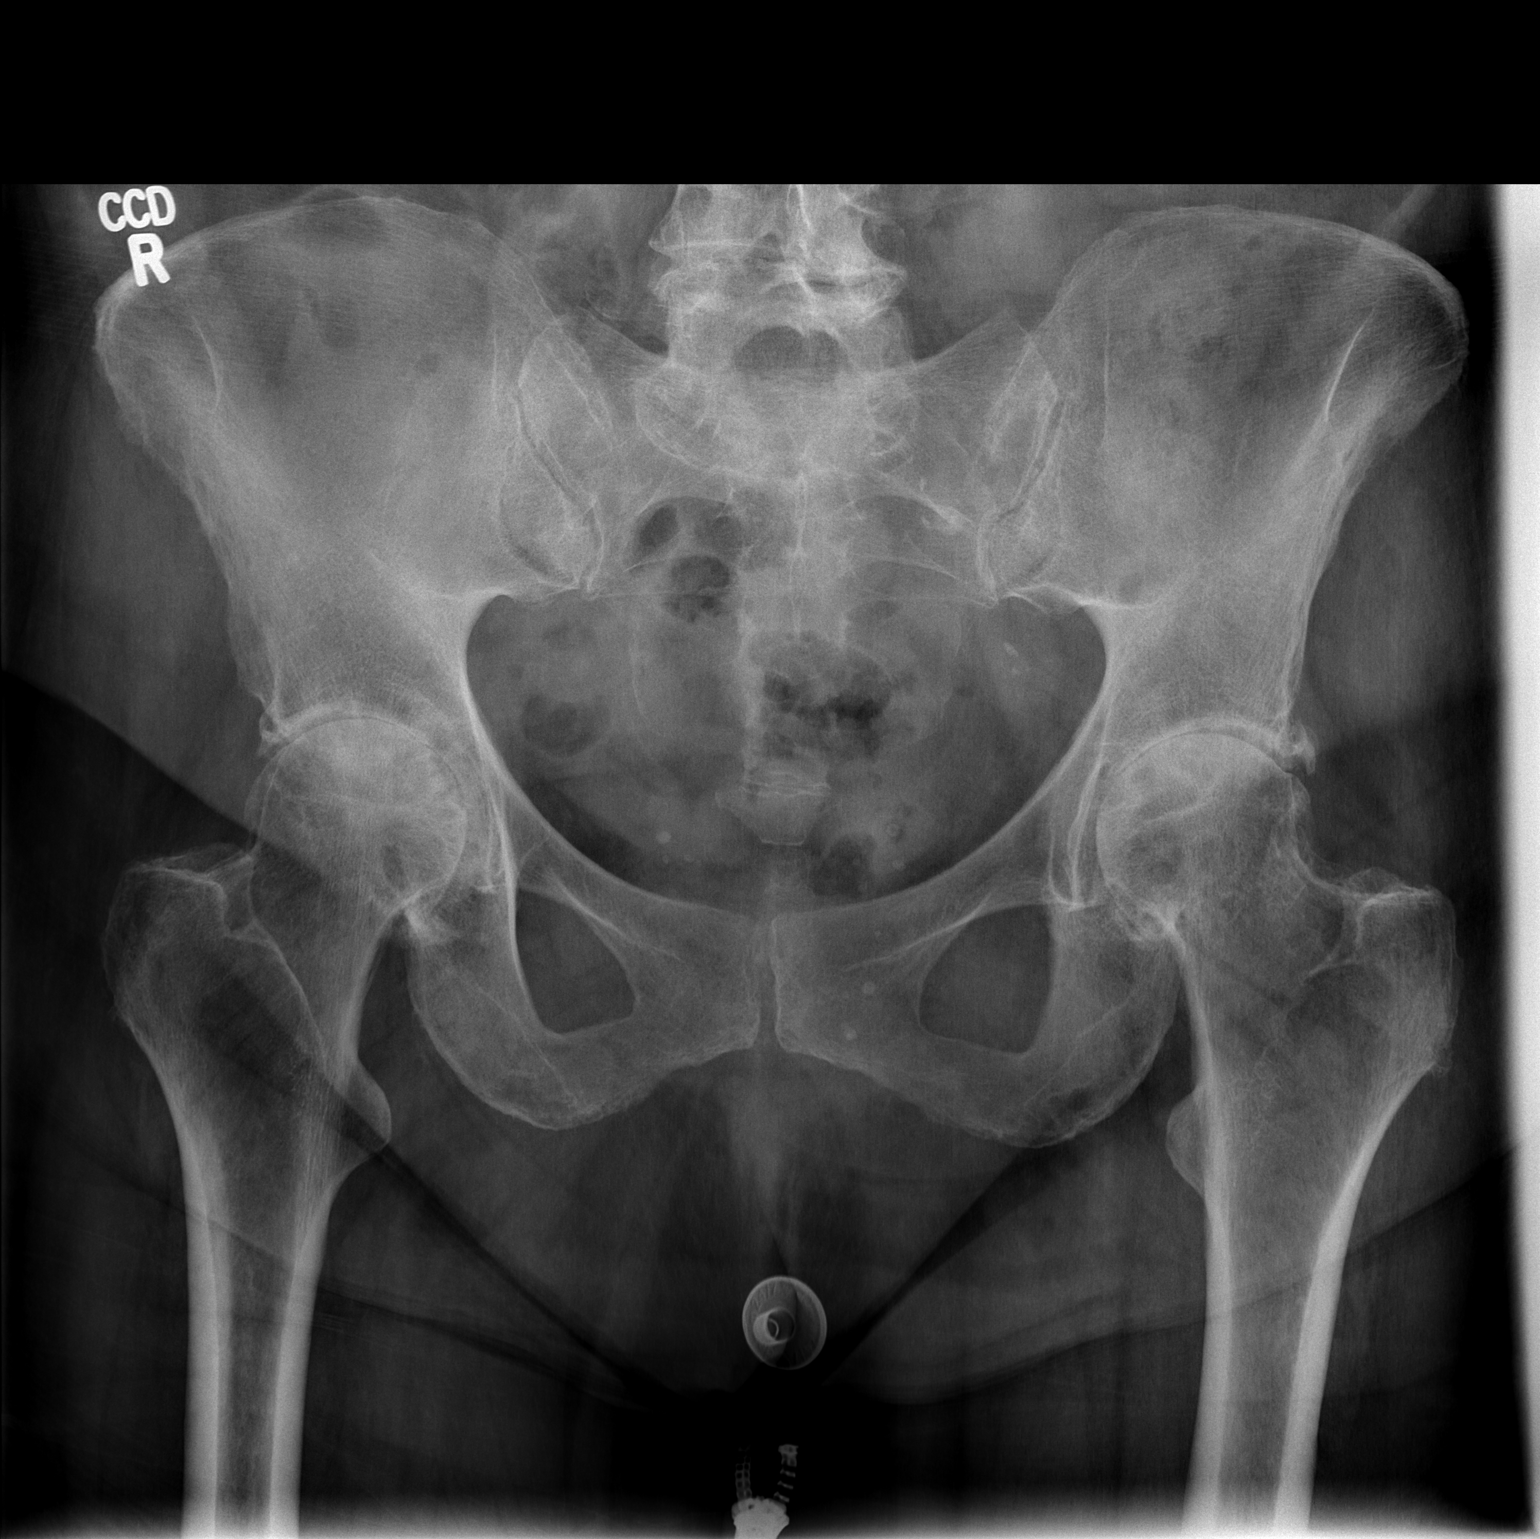

[t hip ap right]
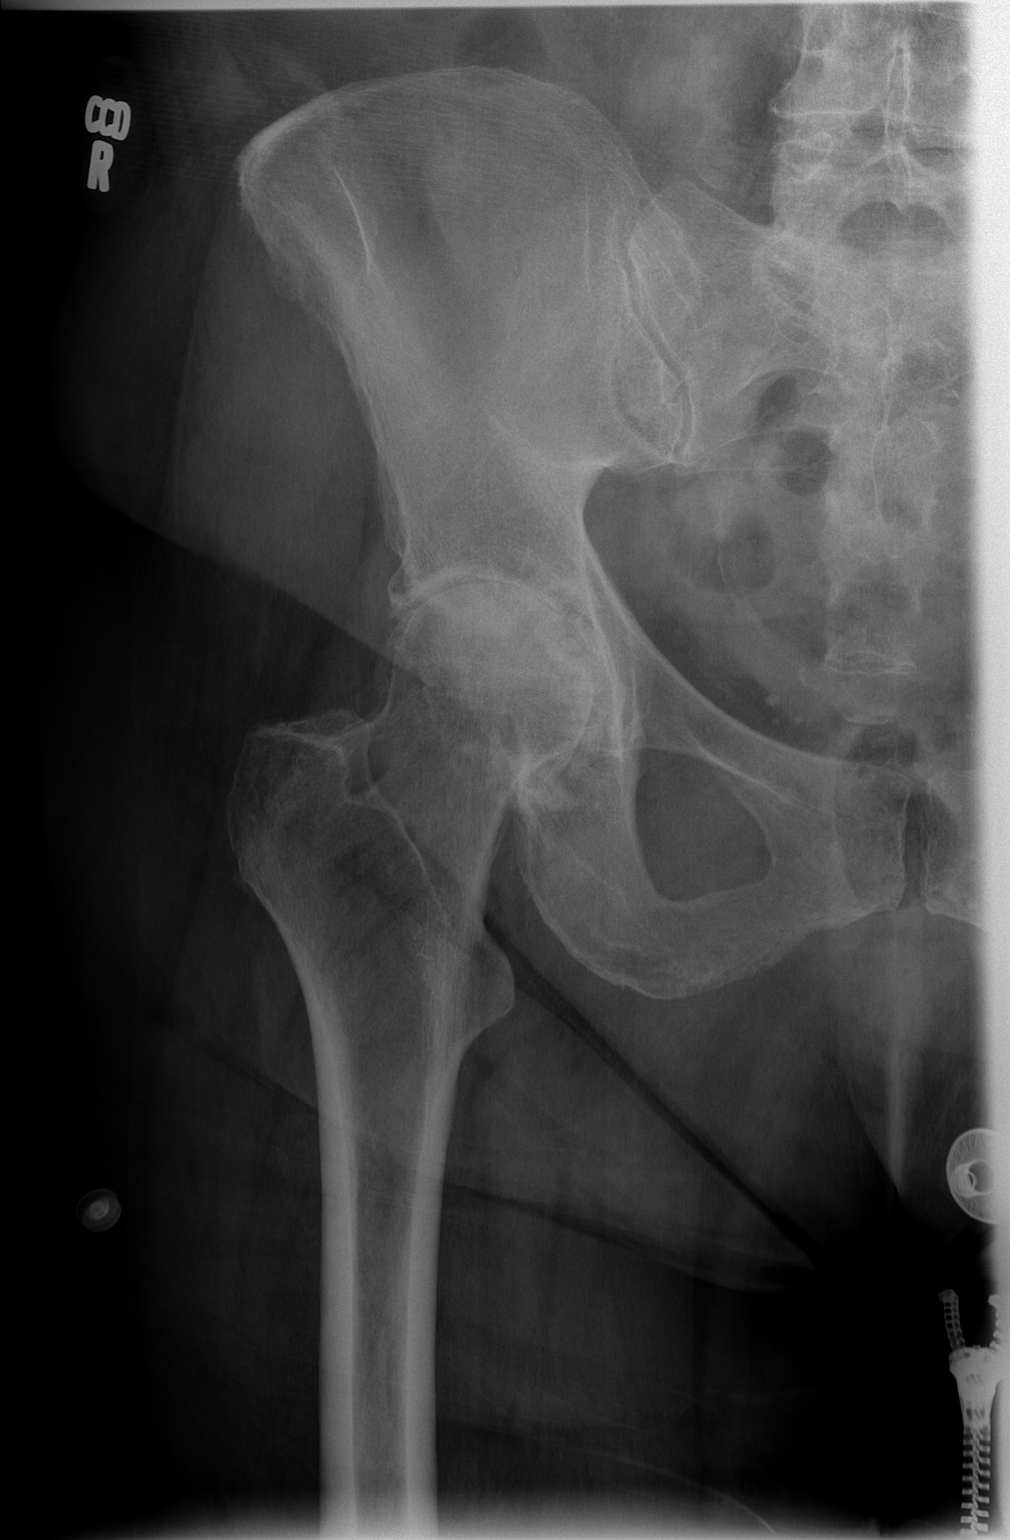

[t hip frog leg right]
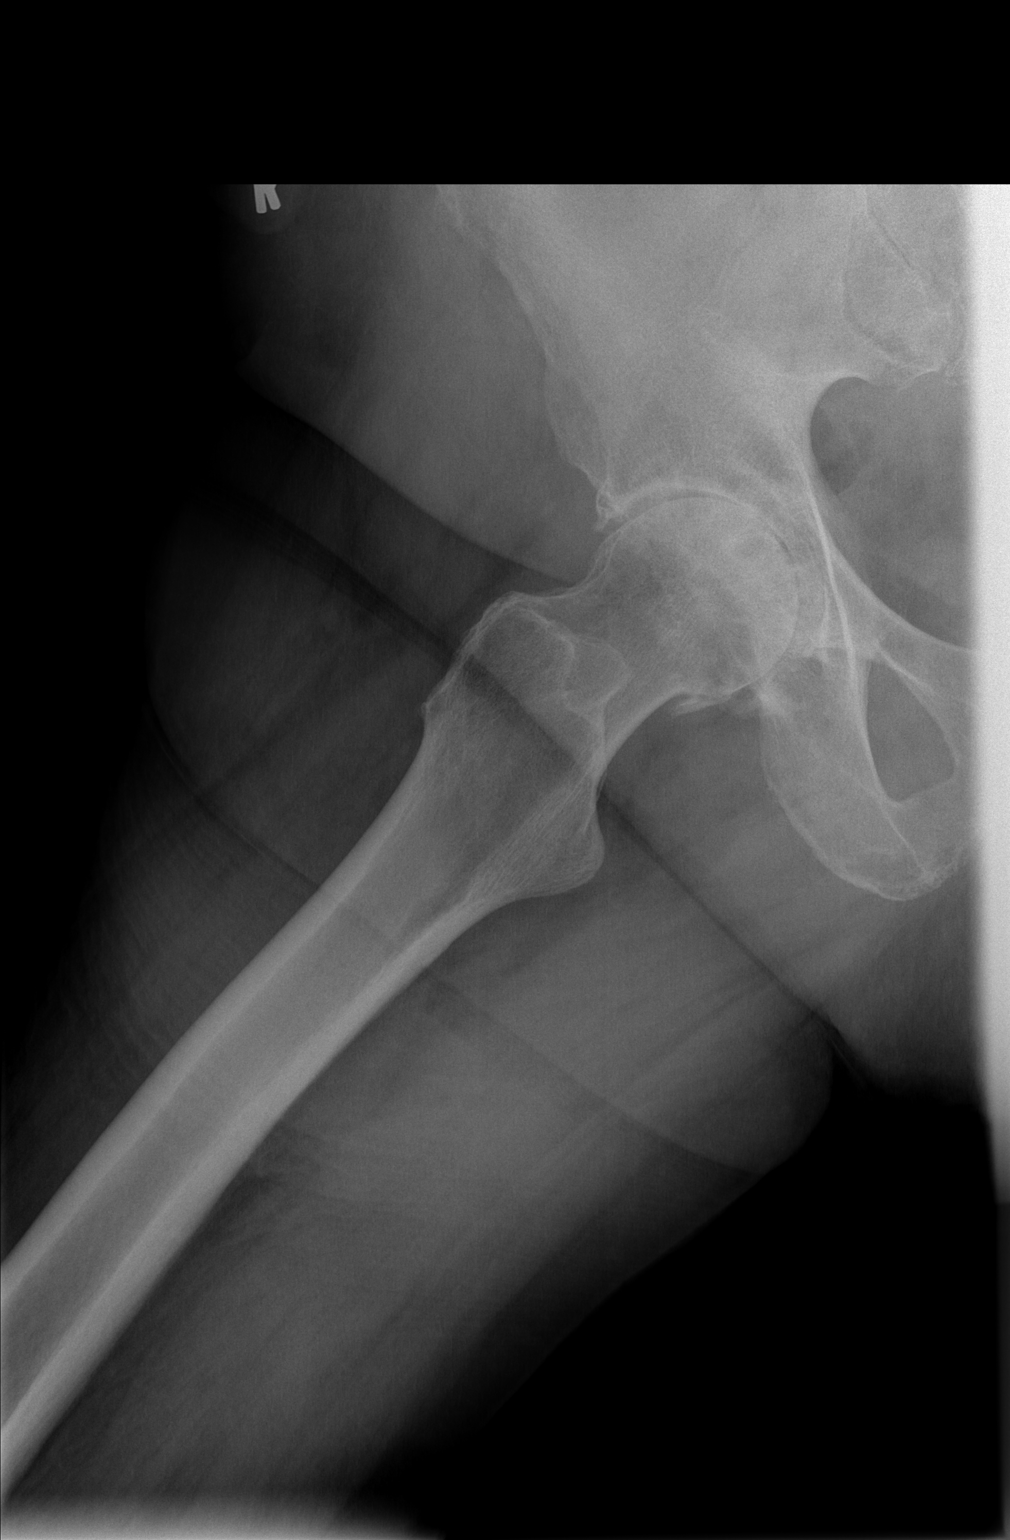

[3 of 3 positions shown; findings below may reference images not displayed]

FINDINGS: The bones are mildly osteopenic. There is severe degenerative change
of both hips. There is no acute or healing fracture. The lower
lumbar spine exhibits mild degenerative change. The observed
portions of the sacrum and SI joints are normal.
IMPRESSION: There is severe degenerative change of both hips.

## 2015-11-22 IMAGING — CR DG CHEST 2V
2 series · 2 of 2 positions shown · non-contrast
Comparison: PA and lateral chest x-ray August 09, 2011

CLINICAL DATA: Preoperative for right hip replacement; history of
hypertension

EXAM:
CHEST  2 VIEW

[w chest pa]
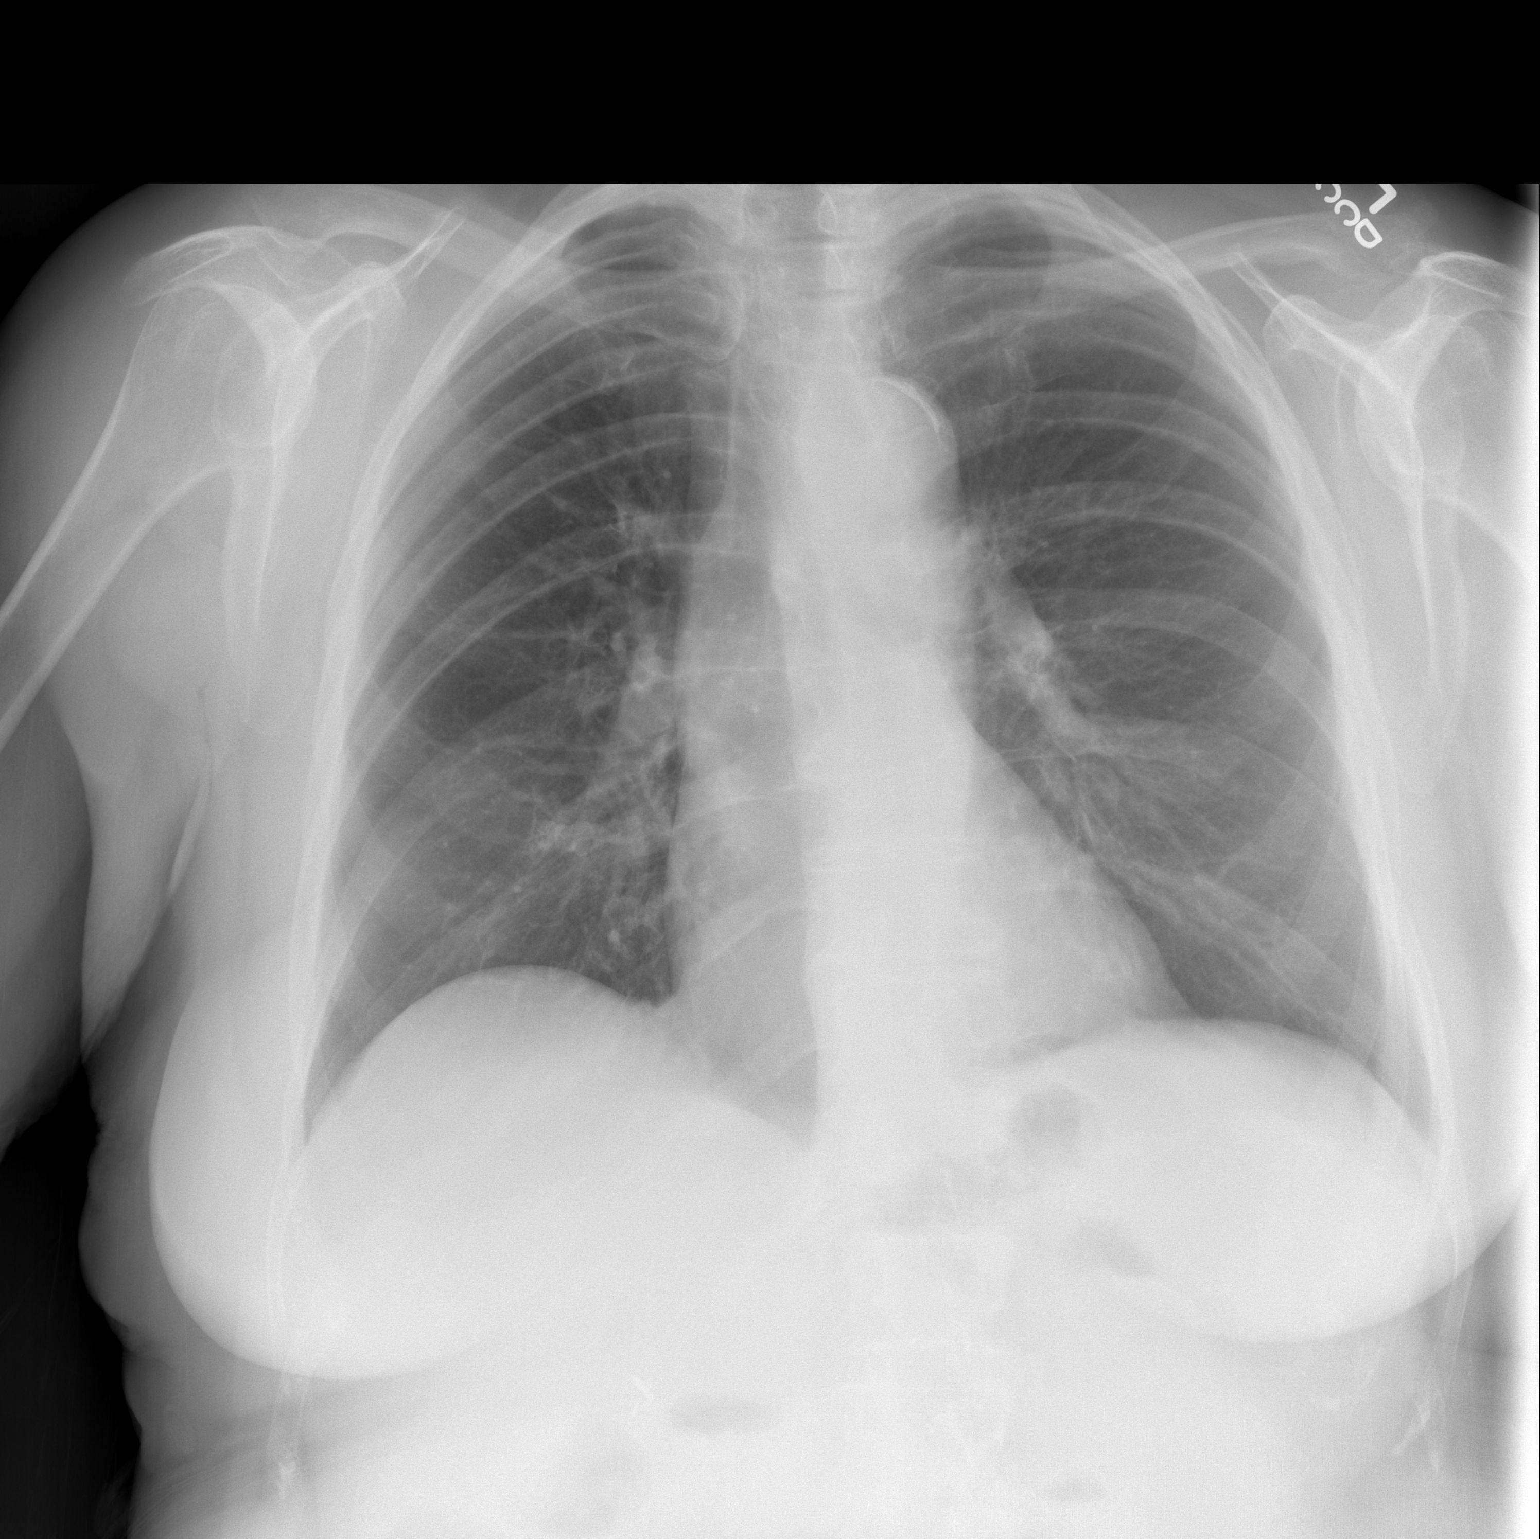

[w chest lat]
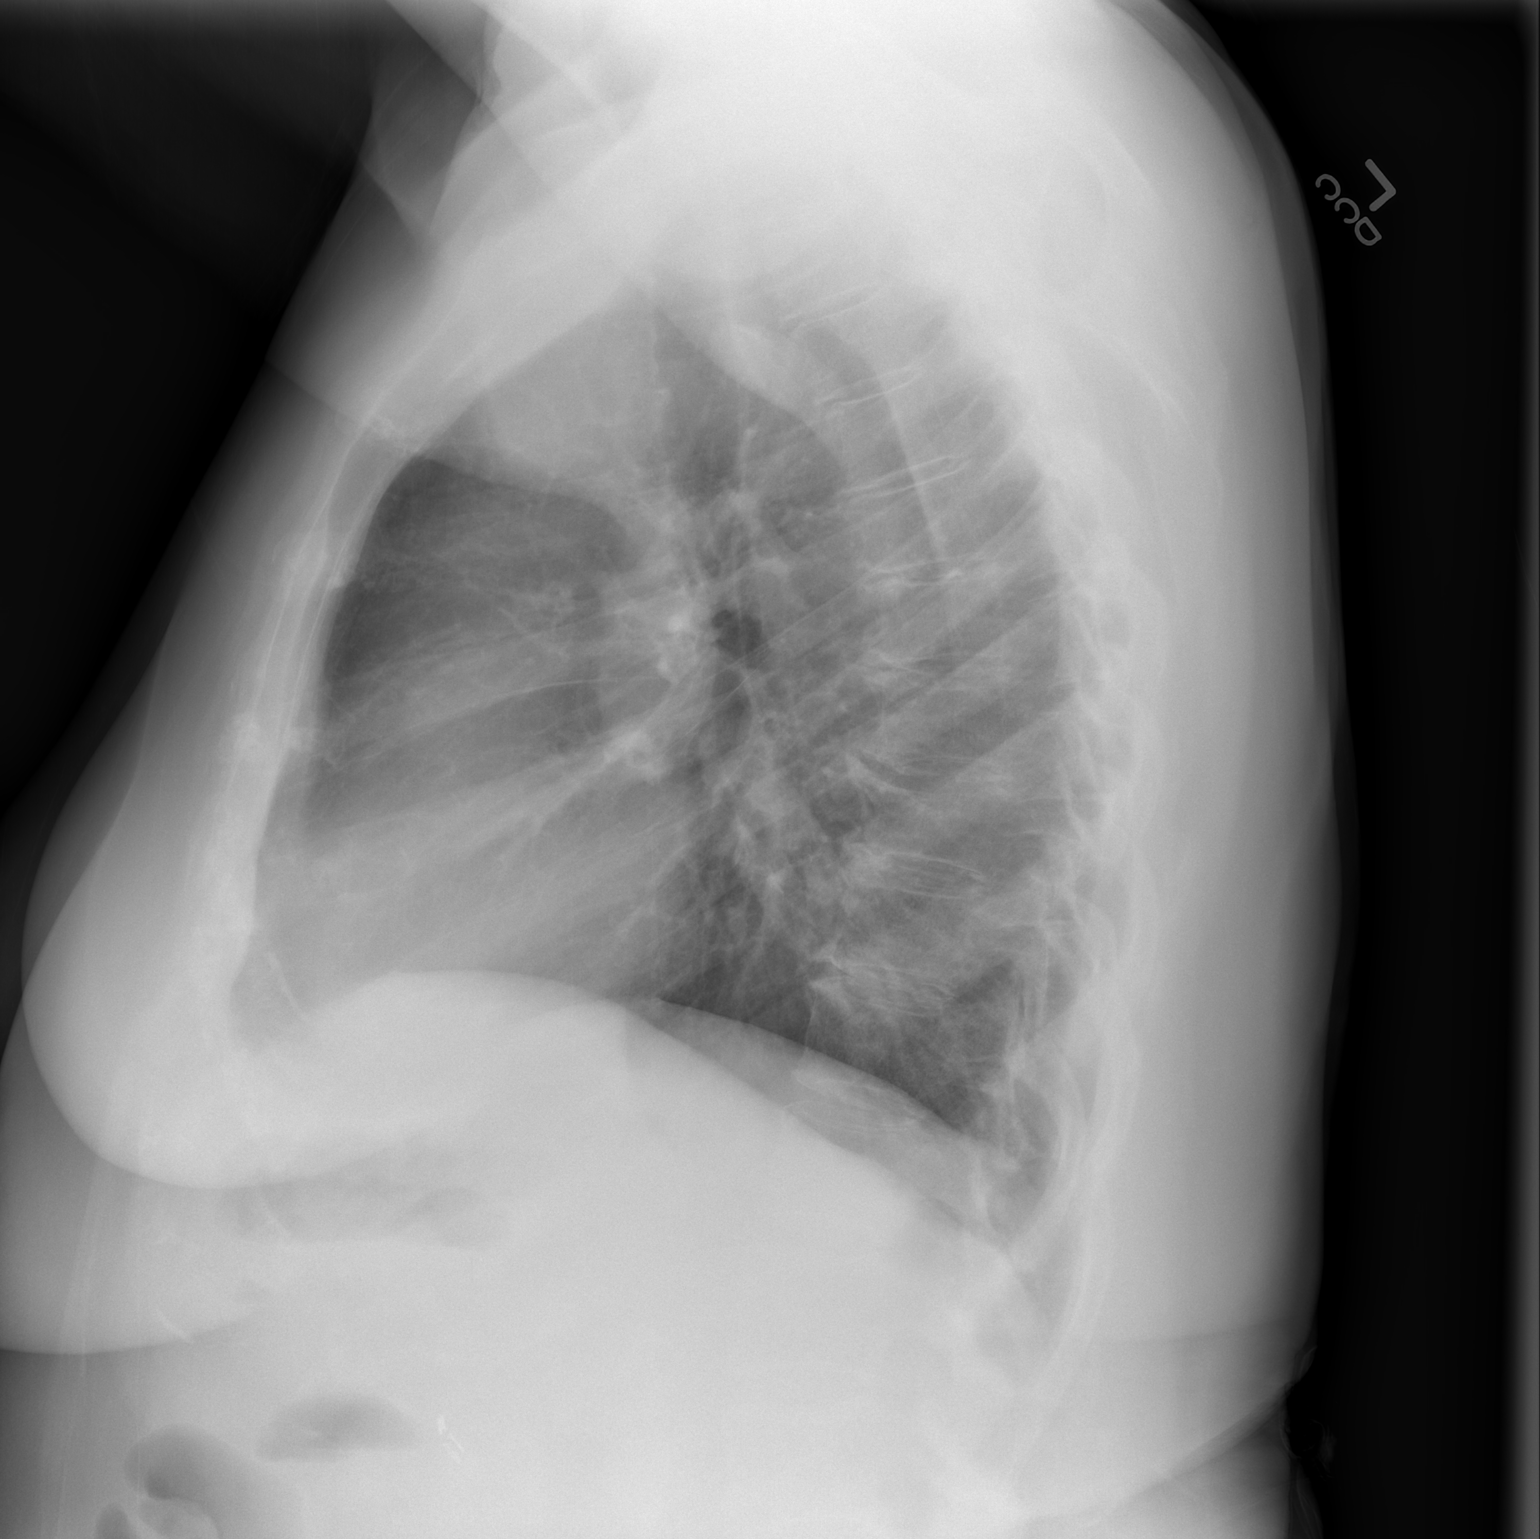

[2 of 2 positions shown; findings below may reference images not displayed]

FINDINGS: The lungs are adequately inflated. There is no focal infiltrate.
There are stable coarse infrahilar lung markings on the left. The
heart and pulmonary vascularity are normal. There is no pleural
effusion. There is mild loss of height of the body of approximately
of approximately T9. This amounts to 15-20% anteriorly.
IMPRESSION: No active cardiopulmonary disease.

## 2017-04-17 ENCOUNTER — Inpatient Hospital Stay (HOSPITAL_COMMUNITY)
Admission: EM | Admit: 2017-04-17 | Discharge: 2017-04-20 | DRG: 872 | Disposition: A | Discharge status: Skilled Nursing Facility | Payer: Medicare Other | Attending: Internal Medicine | Admitting: Internal Medicine

## 2017-04-17 ENCOUNTER — Emergency Department (HOSPITAL_COMMUNITY): Payer: Medicare Other

## 2017-04-17 ENCOUNTER — Encounter (HOSPITAL_COMMUNITY): Payer: Self-pay

## 2017-04-17 DIAGNOSIS — E860 Dehydration: Secondary | ICD-10-CM | POA: Diagnosis present

## 2017-04-17 DIAGNOSIS — Z7982 Long term (current) use of aspirin: Secondary | ICD-10-CM

## 2017-04-17 DIAGNOSIS — M542 Cervicalgia: Secondary | ICD-10-CM | POA: Diagnosis present

## 2017-04-17 DIAGNOSIS — M6282 Rhabdomyolysis: Secondary | ICD-10-CM | POA: Diagnosis not present

## 2017-04-17 DIAGNOSIS — E876 Hypokalemia: Secondary | ICD-10-CM

## 2017-04-17 DIAGNOSIS — Z79899 Other long term (current) drug therapy: Secondary | ICD-10-CM

## 2017-04-17 DIAGNOSIS — A4151 Sepsis due to Escherichia coli [E. coli]: Principal | ICD-10-CM | POA: Diagnosis present

## 2017-04-17 DIAGNOSIS — N179 Acute kidney failure, unspecified: Secondary | ICD-10-CM | POA: Diagnosis present

## 2017-04-17 DIAGNOSIS — I10 Essential (primary) hypertension: Secondary | ICD-10-CM | POA: Diagnosis present

## 2017-04-17 DIAGNOSIS — D6959 Other secondary thrombocytopenia: Secondary | ICD-10-CM | POA: Diagnosis present

## 2017-04-17 DIAGNOSIS — R7989 Other specified abnormal findings of blood chemistry: Secondary | ICD-10-CM | POA: Diagnosis present

## 2017-04-17 DIAGNOSIS — Z800 Family history of malignant neoplasm of digestive organs: Secondary | ICD-10-CM | POA: Diagnosis not present

## 2017-04-17 DIAGNOSIS — K219 Gastro-esophageal reflux disease without esophagitis: Secondary | ICD-10-CM | POA: Diagnosis present

## 2017-04-17 DIAGNOSIS — Z833 Family history of diabetes mellitus: Secondary | ICD-10-CM

## 2017-04-17 DIAGNOSIS — N390 Urinary tract infection, site not specified: Secondary | ICD-10-CM

## 2017-04-17 DIAGNOSIS — G8929 Other chronic pain: Secondary | ICD-10-CM | POA: Diagnosis present

## 2017-04-17 DIAGNOSIS — D649 Anemia, unspecified: Secondary | ICD-10-CM | POA: Diagnosis present

## 2017-04-17 DIAGNOSIS — E785 Hyperlipidemia, unspecified: Secondary | ICD-10-CM | POA: Diagnosis present

## 2017-04-17 DIAGNOSIS — Y92009 Unspecified place in unspecified non-institutional (private) residence as the place of occurrence of the external cause: Secondary | ICD-10-CM

## 2017-04-17 DIAGNOSIS — Z96643 Presence of artificial hip joint, bilateral: Secondary | ICD-10-CM | POA: Diagnosis present

## 2017-04-17 DIAGNOSIS — R7881 Bacteremia: Secondary | ICD-10-CM | POA: Diagnosis not present

## 2017-04-17 DIAGNOSIS — Z85828 Personal history of other malignant neoplasm of skin: Secondary | ICD-10-CM | POA: Diagnosis not present

## 2017-04-17 DIAGNOSIS — W010XXA Fall on same level from slipping, tripping and stumbling without subsequent striking against object, initial encounter: Secondary | ICD-10-CM | POA: Diagnosis present

## 2017-04-17 DIAGNOSIS — B379 Candidiasis, unspecified: Secondary | ICD-10-CM | POA: Diagnosis present

## 2017-04-17 DIAGNOSIS — A419 Sepsis, unspecified organism: Secondary | ICD-10-CM | POA: Diagnosis present

## 2017-04-17 DIAGNOSIS — R0602 Shortness of breath: Secondary | ICD-10-CM

## 2017-04-17 LAB — URINALYSIS, ROUTINE W REFLEX MICROSCOPIC
BILIRUBIN URINE: NEGATIVE
GLUCOSE, UA: NEGATIVE mg/dL
KETONES UR: 20 mg/dL — AB
Nitrite: NEGATIVE
Protein, ur: 100 mg/dL — AB
SQUAMOUS EPITHELIAL / LPF: NONE SEEN
Specific Gravity, Urine: 1.018 (ref 1.005–1.030)
pH: 5 (ref 5.0–8.0)

## 2017-04-17 LAB — CBC WITH DIFFERENTIAL/PLATELET
Basophils Absolute: 0 10*3/uL (ref 0.0–0.1)
Basophils Relative: 0 %
Eosinophils Absolute: 0 10*3/uL (ref 0.0–0.7)
Eosinophils Relative: 0 %
HCT: 34.8 % — ABNORMAL LOW (ref 36.0–46.0)
Hemoglobin: 11.9 g/dL — ABNORMAL LOW (ref 12.0–15.0)
Lymphocytes Relative: 5 %
Lymphs Abs: 0.7 10*3/uL (ref 0.7–4.0)
MCH: 29.7 pg (ref 26.0–34.0)
MCHC: 34.2 g/dL (ref 30.0–36.0)
MCV: 86.8 fL (ref 78.0–100.0)
MONOS PCT: 8 %
Monocytes Absolute: 1 10*3/uL (ref 0.1–1.0)
NEUTROS PCT: 87 %
Neutro Abs: 11.3 10*3/uL — ABNORMAL HIGH (ref 1.7–7.7)
Platelets: 162 10*3/uL (ref 150–400)
RBC: 4.01 MIL/uL (ref 3.87–5.11)
RDW: 14.7 % (ref 11.5–15.5)
WBC: 13 10*3/uL — ABNORMAL HIGH (ref 4.0–10.5)

## 2017-04-17 LAB — COMPREHENSIVE METABOLIC PANEL
ALBUMIN: 3.4 g/dL — AB (ref 3.5–5.0)
ALT: 21 U/L (ref 14–54)
ANION GAP: 12 (ref 5–15)
AST: 62 U/L — AB (ref 15–41)
Alkaline Phosphatase: 48 U/L (ref 38–126)
BUN: 31 mg/dL — AB (ref 6–20)
CHLORIDE: 99 mmol/L — AB (ref 101–111)
CO2: 23 mmol/L (ref 22–32)
Calcium: 8.7 mg/dL — ABNORMAL LOW (ref 8.9–10.3)
Creatinine, Ser: 1.21 mg/dL — ABNORMAL HIGH (ref 0.44–1.00)
GFR calc Af Amer: 46 mL/min — ABNORMAL LOW (ref >60)
GFR calc non Af Amer: 40 mL/min — ABNORMAL LOW (ref >60)
GLUCOSE: 132 mg/dL — AB (ref 65–99)
Potassium: 2.7 mmol/L — CL (ref 3.5–5.1)
SODIUM: 134 mmol/L — AB (ref 135–145)
Total Bilirubin: 1.6 mg/dL — ABNORMAL HIGH (ref 0.3–1.2)
Total Protein: 6.4 g/dL — ABNORMAL LOW (ref 6.5–8.1)

## 2017-04-17 LAB — PROTIME-INR
INR: 1.16
Prothrombin Time: 14.8 seconds (ref 11.4–15.2)

## 2017-04-17 LAB — I-STAT CG4 LACTIC ACID, ED
Lactic Acid, Venous: 1.08 mmol/L (ref 0.5–1.9)
Lactic Acid, Venous: 0.56 mmol/L (ref 0.5–1.9)

## 2017-04-17 LAB — CK: Total CK: 2234 U/L — ABNORMAL HIGH (ref 38–234)

## 2017-04-17 MED ORDER — SODIUM CHLORIDE 0.9 % IV BOLUS (SEPSIS)
500.0000 mL | Freq: Once | INTRAVENOUS | Status: AC
  Administered 2017-04-18: 500 mL via INTRAVENOUS

## 2017-04-17 MED ORDER — POTASSIUM CHLORIDE CRYS ER 20 MEQ PO TBCR
40.0000 meq | EXTENDED_RELEASE_TABLET | Freq: Once | ORAL | Status: AC
  Administered 2017-04-17: 40 meq via ORAL
  Filled 2017-04-17: qty 2

## 2017-04-17 MED ORDER — DEXTROSE 5 % IV SOLN
1.0000 g | Freq: Once | INTRAVENOUS | Status: AC
  Administered 2017-04-18: 1 g via INTRAVENOUS
  Filled 2017-04-17: qty 10

## 2017-04-17 MED ORDER — ACETAMINOPHEN 500 MG PO TABS
1000.0000 mg | ORAL_TABLET | Freq: Once | ORAL | Status: AC
  Administered 2017-04-17: 1000 mg via ORAL
  Filled 2017-04-17: qty 2

## 2017-04-17 MED ORDER — SODIUM CHLORIDE 0.9 % IV BOLUS (SEPSIS)
1000.0000 mL | Freq: Once | INTRAVENOUS | Status: AC
  Administered 2017-04-17: 1000 mL via INTRAVENOUS

## 2017-04-17 MED ORDER — POTASSIUM CHLORIDE 10 MEQ/100ML IV SOLN
10.0000 meq | INTRAVENOUS | Status: AC
  Administered 2017-04-17 – 2017-04-18 (×3): 10 meq via INTRAVENOUS
  Filled 2017-04-17 (×3): qty 100

## 2017-04-17 NOTE — ED Provider Notes (Addendum)
[facility] DEPT Provider Note   CSN: [id number] Arrival date & time: 04/17/17  1922     History   Chief Complaint Chief Complaint  Patient presents with  . Fall  . Weakness    HPI [name] is a 83 y.o. female.  Patient is a 83 year old female with a history of hypertension, hyperlipidemia, fibromyalgia and ulcerative colitis who presents with weakness and falls. She states she's been treated for the last 3 weeks for pneumonia. She completed her course of antibiotics. She states she feels like her cough is been getting better although she still very weak. Over the last 2 days she's had worsening weakness with lightheadedness when she stands up. This is lead to her falling. She had a fall yesterday where she got dizzy and fell down onto her knees. She wasn't able to get up for several hours. This happened again this morning about 7 AM. She was on the toilet and stood up and got dizzy and fell down onto her knees. She laid on the ground for about 8 hours. She's had some chills at home but no known fevers. She denies any injuries from the falls. She doesn't think that she hit her head. She has chronic neck pain which is unchanged from her baseline. She denies any back pain. No other extremity injuries. She had some vomiting yesterday but none today. She denies abdominal pain. She denies any diarrhea. No urinary symptoms. She currently is being treated for a yeast infection that occurred after being on antibiotics.      Past Medical History:  Diagnosis Date  . Anemia   . Arthritis   . Complication of anesthesia    "I GOT PNEUMONIA THE NEXT DAY FROM THE ANESTHESIA"  . Depression   . Fibromyalgia   . GERD (gastroesophageal reflux disease)   . History of skin cancer   . HLD (hyperlipidemia)   . HTN (hypertension)   . Internal hemorrhoids   . Osteoarthritis   . Peripheral neuropathy   . Shortness of breath   . Skin cancer   . SUI (stress urinary incontinence, female)   .  Ulcerative colitis     Patient Active Problem List   Diagnosis Date Noted  . Sepsis ([place]) 04/17/2017  . Insomnia, unspecified 08/12/2014  . OA (osteoarthritis) of hip 08/06/2014  . Chest pain 04/05/2012  . Dyspnea 04/05/2012  . Intraductal papilloma of breast - left breast 09/13/2011  . HYPERLIPIDEMIA 02/14/2011  . DEPRESSION 02/14/2011  . PERIPHERAL NEUROPATHY 02/14/2011  . HYPERTENSION 02/14/2011  . GERD 02/14/2011  . OSTEOARTHRITIS 02/14/2011  . URINARY INCONTINENCE, STRESS 02/14/2011  . SKIN CANCER, HX OF 02/14/2011    Past Surgical History:  Procedure Laterality Date  . ABDOMINAL HYSTERECTOMY    . APPENDECTOMY    . BREAST EXCISIONAL BIOPSY Left 08/10/2011    Dr Margot Chimes  . BREAST SURGERY    . CARPAL TUNNEL RELEASE Left   . CESAREAN SECTION    . CHOLECYSTECTOMY    . COLONOSCOPY  04/29/2003   internal hemorrhoids  . JOINT REPLACEMENT Bilateral 2010 and 2012   KNEES  . TOTAL HIP ARTHROPLASTY Right 08/06/2014   Procedure: RIGHT TOTAL HIP ARTHROPLASTY ANTERIOR APPROACH;  Surgeon: Gearlean Alf, MD;  Location: [facility];  Service: Orthopedics;  Laterality: Right;  . TOTAL HIP ARTHROPLASTY Left 12/17/2014   Procedure: LEFT TOTAL HIP ARTHROPLASTY ANTERIOR APPROACH;  Surgeon: Gearlean Alf, MD;  Location: [facility];  Service: Orthopedics;  Laterality: Left;    OB  History    No data available       Home Medications    Prior to Admission medications   Medication Sig Start Date End Date Taking? Authorizing Provider  aspirin EC 81 MG tablet Take 81 mg by mouth daily.   Yes [provider]  bimatoprost (LUMIGAN) 0.01 % SOLN Place 1 drop into both eyes at bedtime.   Yes [provider]  CONTRAVE 8-90 MG TB12 Take 1 tablet by mouth daily. 03/28/17  Yes [provider]  Docusate Calcium (STOOL SOFTENER PO) Take 100 mg by mouth 2 (two) times daily.    Yes [provider]  estradiol (CLIMARA - DOSED IN MG/24 HR) 0.05 mg/24hr patch Place 1 patch onto  the skin once a week. Changes every thursday 06/02/15  Yes [provider]  guaiFENesin-codeine 100-10 MG/5ML syrup Take 10 mLs by mouth every 4 (four) hours. 03/24/17  Yes [provider]  lisinopril (PRINIVIL,ZESTRIL) 30 MG tablet Take 30 mg by mouth daily.   Yes [provider]  lisinopril-hydrochlorothiazide (PRINZIDE,ZESTORETIC) 20-25 MG per tablet Take 1 tablet by mouth every morning.   Yes [provider]  methocarbamol (ROBAXIN) 500 MG tablet Take 1 tablet (500 mg total) by mouth every 6 (six) hours as needed for muscle spasms. 12/18/14  Yes Perkins, Alexzandrew L, PA-C  Miconazole Nitrate-Wipes (MONISTAT 3 TRIPLE ACTION VA) Place 1 suppository vaginally daily.   Yes [provider]  Multiple Vitamins-Minerals (SENIOR VITES PO) Take 1 tablet by mouth daily.   Yes [provider]  omeprazole (PRILOSEC) 40 MG capsule Take 40 mg by mouth daily.   Yes [provider]  simvastatin (ZOCOR) 20 MG tablet Take 20 mg by mouth at bedtime.    Yes [provider]  timolol (TIMOPTIC) 0.5 % ophthalmic solution Place 1 drop into both eyes daily after breakfast.   Yes [provider]  traZODone (DESYREL) 100 MG tablet Take 200 mg by mouth at bedtime.    Yes [provider]  hydrochlorothiazide (HYDRODIURIL) 25 MG tablet Take 25 mg by mouth every morning.  02/03/14   [provider]    Family History Family History  Problem Relation Age of Onset  . Colon cancer Mother 48  . Diabetes Brother     Social History Social History  Substance Use Topics  . Smoking status: Never Smoker  . Smokeless tobacco: Never Used  . Alcohol use No     Allergies   Procaine and Epinephrine   Review of Systems Review of Systems  Constitutional: Positive for chills and fatigue. Negative for diaphoresis and fever.  HENT: Negative for congestion, rhinorrhea and sneezing.   Eyes: Negative.   Respiratory: Negative for  cough, chest tightness and shortness of breath.   Cardiovascular: Negative for chest pain and leg swelling.  Gastrointestinal: Positive for nausea and vomiting. Negative for abdominal pain, blood in stool and diarrhea.  Genitourinary: Negative for difficulty urinating, flank pain, frequency and hematuria.  Musculoskeletal: Positive for neck pain. Negative for arthralgias and back pain.  Skin: Negative for rash and wound.  Neurological: Positive for dizziness, light-headedness and headaches. Negative for speech difficulty, weakness and numbness.     Physical Exam Updated Vital Signs BP 102/65   Pulse 90   Temp 100.2 F (37.9 C) (Oral)   Resp (!) 30   Ht 5\' 10"  (1.778 m)   Wt 198 lb (89.8 kg)   SpO2 91%   BMI 28.41 kg/m   Physical Exam  Constitutional:  She is oriented to person, place, and time. She appears well-developed and well-nourished.  HENT:  Head: Normocephalic and atraumatic.  Eyes: Pupils are equal, round, and reactive to light.  Neck:  Some generalized tenderness throughout the cervical spine. No pain to the thoracic or lumbosacral spine  Cardiovascular: Normal rate, regular rhythm and normal heart sounds.   Pulmonary/Chest: Effort normal and breath sounds normal. No respiratory distress. She has no wheezes. She has no rales. She exhibits no tenderness.  Abdominal: Soft. Bowel sounds are normal. There is no tenderness. There is no rebound and no guarding.  Musculoskeletal: Normal range of motion. She exhibits no edema.  No pain with palpation or range of motion of the extremities  Lymphadenopathy:    She has no cervical adenopathy.  Neurological: She is alert and oriented to person, place, and time.  Skin: Skin is warm and dry. No rash noted.  Psychiatric: She has a normal mood and affect.     ED Treatments / Results  Labs (all labs ordered are listed, but only abnormal results are displayed) Labs Reviewed  COMPREHENSIVE METABOLIC PANEL - Abnormal; Notable for  the following:       Result Value   Sodium 134 (*)    Potassium 2.7 (*)    Chloride 99 (*)    Glucose, Bld 132 (*)    BUN 31 (*)    Creatinine, Ser 1.21 (*)    Calcium 8.7 (*)    Total Protein 6.4 (*)    Albumin 3.4 (*)    AST 62 (*)    Total Bilirubin 1.6 (*)    GFR calc non Af Amer 40 (*)    GFR calc Af Amer 46 (*)    All other components within normal limits  CBC WITH DIFFERENTIAL/PLATELET - Abnormal; Notable for the following:    WBC 13.0 (*)    Hemoglobin 11.9 (*)    HCT 34.8 (*)    Neutro Abs 11.3 (*)    All other components within normal limits  URINALYSIS, ROUTINE W REFLEX MICROSCOPIC - Abnormal; Notable for the following:    Color, Urine AMBER (*)    APPearance CLOUDY (*)    Hgb urine dipstick LARGE (*)    Ketones, ur 20 (*)    Protein, ur 100 (*)    Leukocytes, UA LARGE (*)    Bacteria, UA RARE (*)    All other components within normal limits  CK - Abnormal; Notable for the following:    Total CK 2,234 (*)    All other components within normal limits  CULTURE, BLOOD (ROUTINE X 2)  CULTURE, BLOOD (ROUTINE X 2)  URINE CULTURE  PROTIME-INR  I-STAT CG4 LACTIC ACID, ED  I-STAT CG4 LACTIC ACID, ED    EKG  EKG Interpretation  Date/Time:  Monday Apr 17 2017 19:39:17 EDT Ventricular Rate:  101 PR Interval:    QRS Duration: 111 QT Interval:  341 QTC Calculation: 442 R Axis:   21 Text Interpretation:  Sinus tachycardia Low voltage, extremity and precordial leads Probable anteroseptal infarct, old Confirmed by Crystallynn Noorani  MD, [place] ([id number]) on 04/17/2017 9:26:37 PM       Radiology Dg Chest 2 View  Result Date: 04/17/2017 CLINICAL DATA:  PT RECEIVED FROM HOME VIA EMS FOR A FALL. PER EMS, PT HAD FALLEN EARLIER IN THE DAY AND WAS FOUND ON THE FLOOR BY HER FAMILY WHEN THEY GOT HOME FROM WORK. PT STS SHE HAD BEEN ON THE FLOOR FOR APPROXIMATELY 8 HOURS. EXAM: CHEST  2 VIEW COMPARISON:  07/31/2015 FINDINGS: Heart size is normal. Lungs are free of focal consolidations and  pleural effusions. No pulmonary edema. Overlying the left lung apex, there is a nodular opacity, possibly representing artifact from EKG lead and normal structures. However, a pulmonary nodule is not excluded. No acute fractures or pneumothorax. IMPRESSION: 1. Question of left upper lobe pulmonary nodule. Further evaluation is indicated. Consider repeat PA view of the chest without EKG leads if the patient is able. Otherwise, consider CT of the chest. 2. No acute cardiopulmonary disease. Electronically Signed   By: Nolon Nations M.D.   On: 04/17/2017 20:43   Ct Head Wo Contrast  Result Date: 04/17/2017 CLINICAL DATA:  Patient fell earlier in the day.  Weakness x1 week. EXAM: CT HEAD WITHOUT CONTRAST CT CERVICAL SPINE WITHOUT CONTRAST TECHNIQUE: Multidetector CT imaging of the head and cervical spine was performed following the standard protocol without intravenous contrast. Multiplanar CT image reconstructions of the cervical spine were also generated. COMPARISON:  Cervical spine MRI 05/26/2009 FINDINGS: CT HEAD FINDINGS BRAIN: There is mild sulcal prominence consistent with superficial atrophy. No intraparenchymal hemorrhage, mass effect nor midline shift. Periventricular and subcortical white matter hypodensities consistent with chronic small vessel ischemic disease are identified. No acute large vascular territory infarcts. No abnormal extra-axial fluid collections. Basal cisterns are not effaced and midline. VASCULAR: Moderate calcific atherosclerosis of the carotid siphons. SKULL: No skull fracture. No significant scalp soft tissue swelling. SINUSES/ORBITS: The mastoid air-cells are clear. The included paranasal sinuses are well-aerated.The included ocular globes and orbital contents are non-suspicious. OTHER: None. CT CERVICAL SPINE FINDINGS Alignment: Stone reversal cervical lordosis similar to prior exam with apex at C5. Skull base and vertebrae: No acute fracture. No primary bone lesion or focal  pathologic process. Soft tissues and spinal canal: No prevertebral fluid or swelling. No visible canal hematoma. Disc levels: Mild disc space narrowing C3-4 with moderate to marked disc space narrowing noted at C4-5, C5-6 and C6-7. Small posterior marginal osteophytes contribute to C5-6 and C6-7 mild bilateral neural foraminal encroachment. Bilateral multilevel facet arthropathy with joint space narrowing and facet hypertrophy is seen. Uncovertebral joint osteoarthritis is identified on the left at C3-4, bilaterally at C4-5, C5-6 and C6-7. Upper chest: Nonacute Other: None IMPRESSION: 1. No acute intracranial abnormality.  Mild superficial atrophy. 2. Cervical spondylosis without acute posttraumatic fracture or subluxations. Electronically Signed   By: Ashley Royalty M.D.   On: 04/17/2017 21:37   Ct Cervical Spine Wo Contrast  Result Date: 04/17/2017 CLINICAL DATA:  Patient fell earlier in the day.  Weakness x1 week. EXAM: CT HEAD WITHOUT CONTRAST CT CERVICAL SPINE WITHOUT CONTRAST TECHNIQUE: Multidetector CT imaging of the head and cervical spine was performed following the standard protocol without intravenous contrast. Multiplanar CT image reconstructions of the cervical spine were also generated. COMPARISON:  Cervical spine MRI 05/26/2009 FINDINGS: CT HEAD FINDINGS BRAIN: There is mild sulcal prominence consistent with superficial atrophy. No intraparenchymal hemorrhage, mass effect nor midline shift. Periventricular and subcortical white matter hypodensities consistent with chronic small vessel ischemic disease are identified. No acute large vascular territory infarcts. No abnormal extra-axial fluid collections. Basal cisterns are not effaced and midline. VASCULAR: Moderate calcific atherosclerosis of the carotid siphons. SKULL: No skull fracture. No significant scalp soft tissue swelling. SINUSES/ORBITS: The mastoid air-cells are clear. The included paranasal sinuses are well-aerated.The included ocular  globes and orbital contents are non-suspicious. OTHER: None. CT CERVICAL SPINE FINDINGS Alignment: Stone reversal cervical lordosis similar to prior exam with apex  at C5. Skull base and vertebrae: No acute fracture. No primary bone lesion or focal pathologic process. Soft tissues and spinal canal: No prevertebral fluid or swelling. No visible canal hematoma. Disc levels: Mild disc space narrowing C3-4 with moderate to marked disc space narrowing noted at C4-5, C5-6 and C6-7. Small posterior marginal osteophytes contribute to C5-6 and C6-7 mild bilateral neural foraminal encroachment. Bilateral multilevel facet arthropathy with joint space narrowing and facet hypertrophy is seen. Uncovertebral joint osteoarthritis is identified on the left at C3-4, bilaterally at C4-5, C5-6 and C6-7. Upper chest: Nonacute Other: None IMPRESSION: 1. No acute intracranial abnormality.  Mild superficial atrophy. 2. Cervical spondylosis without acute posttraumatic fracture or subluxations. Electronically Signed   By: Ashley Royalty M.D.   On: 04/17/2017 21:37    Procedures Procedures (including critical care time)  Medications Ordered in ED Medications  potassium chloride 10 mEq in 100 mL IVPB (10 mEq Intravenous New Bag/Given 04/17/17 2321)  cefTRIAXone (ROCEPHIN) 1 g in dextrose 5 % 50 mL IVPB (not administered)  sodium chloride 0.9 % bolus 500 mL (not administered)  acetaminophen (TYLENOL) tablet 1,000 mg (1,000 mg Oral Given 04/17/17 2146)  sodium chloride 0.9 % bolus 1,000 mL (1,000 mLs Intravenous New Bag/Given 04/17/17 2212)  potassium chloride SA (K-DUR,KLOR-CON) CR tablet 40 mEq (40 mEq Oral Given 04/17/17 2211)     Initial Impression / Assessment and Plan / ED Course  I have reviewed the triage vital signs and the nursing notes.  Pertinent labs & imaging results that were available during my care of the patient were reviewed by me and considered in my medical decision making (see chart for details).     Patient  is a 83 year old female who came in for generalized weakness and fever. She does have evidence of urinary tract infection. There is no suggestions of pneumonia. No pain or injuries from the fall. I did do a CT scan of her head and cervical spine which were negative for acute injuries. She was given IV Rocephin. Urine culture was obtained. Blood cultures were obtained that she's had 2 negative lactates. She's had no episodes of hypotension. Her tachycardia has resolved once her fever improved. She does have evidence of rhabdomyolysis. She was started on IV fluids. Her potassium was low as well and this was replaced in the ED. I spoke with Dr. Hal Hope who will admit the patient.  Final Clinical Impressions(s) / ED Diagnoses   Final diagnoses:  Lower urinary tract infectious disease  Non-traumatic rhabdomyolysis  Hypokalemia    New Prescriptions New Prescriptions   No medications on file     Malvin Johns, MD 04/17/17 [place]    Malvin Johns, MD 04/17/17 2345

## 2017-04-17 NOTE — ED Notes (Signed)
Bed: WA06 Expected date:  Expected time:  Means of arrival:  Comments: EMS 83 yo, weaknesss

## 2017-04-17 NOTE — ED Triage Notes (Signed)
PT RECEIVED FROM HOME VIA EMS FOR A FALL. PER EMS, PT HAD FALLEN EARLIER IN THE DAY AND WAS FOUND ON THE FLOOR BY HER FAMILY WHEN THEY GOT HOME FROM WORK. PT STS SHE HAD BEEN ON THE FLOOR FOR APPROXIMATELY 8 HOURS. PT ALSO STS WEAKNESS X1 WEEK. PT STS SHE VOMITED X4 YESTERDAY, AS WELL. PT IS CURRENTLY BEING TX'D FOR A YEAST INFECTION, AND EMS STS THE ROOM SMELLED OF A STRONG URINE ODOR. T 102 BP 110/70 HR 11 CBG 143

## 2017-04-18 ENCOUNTER — Encounter (HOSPITAL_COMMUNITY): Payer: Self-pay | Admitting: Internal Medicine

## 2017-04-18 ENCOUNTER — Inpatient Hospital Stay (HOSPITAL_COMMUNITY): Payer: Medicare Other

## 2017-04-18 DIAGNOSIS — I10 Essential (primary) hypertension: Secondary | ICD-10-CM

## 2017-04-18 DIAGNOSIS — A419 Sepsis, unspecified organism: Secondary | ICD-10-CM

## 2017-04-18 DIAGNOSIS — M6282 Rhabdomyolysis: Secondary | ICD-10-CM

## 2017-04-18 DIAGNOSIS — N390 Urinary tract infection, site not specified: Secondary | ICD-10-CM

## 2017-04-18 DIAGNOSIS — E876 Hypokalemia: Secondary | ICD-10-CM

## 2017-04-18 LAB — COMPREHENSIVE METABOLIC PANEL
ALBUMIN: 2.8 g/dL — AB (ref 3.5–5.0)
ALT: 24 U/L (ref 14–54)
ANION GAP: 9 (ref 5–15)
AST: 65 U/L — AB (ref 15–41)
Alkaline Phosphatase: 44 U/L (ref 38–126)
BUN: 28 mg/dL — AB (ref 6–20)
CHLORIDE: 107 mmol/L (ref 101–111)
CO2: 19 mmol/L — ABNORMAL LOW (ref 22–32)
Calcium: 8 mg/dL — ABNORMAL LOW (ref 8.9–10.3)
Creatinine, Ser: 0.91 mg/dL (ref 0.44–1.00)
GFR calc Af Amer: >60 mL/min (ref >60)
GFR, EST NON AFRICAN AMERICAN: 56 mL/min — AB (ref >60)
Glucose, Bld: 115 mg/dL — ABNORMAL HIGH (ref 65–99)
POTASSIUM: 3.9 mmol/L (ref 3.5–5.1)
Sodium: 135 mmol/L (ref 135–145)
Total Bilirubin: 1 mg/dL (ref 0.3–1.2)
Total Protein: 5.5 g/dL — ABNORMAL LOW (ref 6.5–8.1)

## 2017-04-18 LAB — CBC WITH DIFFERENTIAL/PLATELET
BASOS ABS: 0 10*3/uL (ref 0.0–0.1)
Basophils Relative: 0 %
EOS PCT: 0 %
Eosinophils Absolute: 0 10*3/uL (ref 0.0–0.7)
HCT: 32.1 % — ABNORMAL LOW (ref 36.0–46.0)
HEMOGLOBIN: 10.8 g/dL — AB (ref 12.0–15.0)
LYMPHS ABS: 0.4 10*3/uL — AB (ref 0.7–4.0)
Lymphocytes Relative: 4 %
MCH: 29.4 pg (ref 26.0–34.0)
MCHC: 33.6 g/dL (ref 30.0–36.0)
MCV: 87.5 fL (ref 78.0–100.0)
Monocytes Absolute: 0.7 10*3/uL (ref 0.1–1.0)
Monocytes Relative: 8 %
NEUTROS ABS: 8.1 10*3/uL — AB (ref 1.7–7.7)
Neutrophils Relative %: 88 %
PLATELETS: 130 10*3/uL — AB (ref 150–400)
RBC: 3.67 MIL/uL — AB (ref 3.87–5.11)
RDW: 15.1 % (ref 11.5–15.5)
WBC: 9.2 10*3/uL (ref 4.0–10.5)

## 2017-04-18 LAB — BLOOD CULTURE ID PANEL (REFLEXED)
Acinetobacter baumannii: NOT DETECTED
CANDIDA GLABRATA: NOT DETECTED
CANDIDA TROPICALIS: NOT DETECTED
CARBAPENEM RESISTANCE: NOT DETECTED
Candida albicans: NOT DETECTED
Candida krusei: NOT DETECTED
Candida parapsilosis: NOT DETECTED
ENTEROCOCCUS SPECIES: NOT DETECTED
Enterobacter cloacae complex: NOT DETECTED
Enterobacteriaceae species: DETECTED — AB
Escherichia coli: DETECTED — AB
Haemophilus influenzae: NOT DETECTED
Klebsiella oxytoca: NOT DETECTED
Klebsiella pneumoniae: NOT DETECTED
LISTERIA MONOCYTOGENES: NOT DETECTED
NEISSERIA MENINGITIDIS: NOT DETECTED
PROTEUS SPECIES: NOT DETECTED
Pseudomonas aeruginosa: NOT DETECTED
SERRATIA MARCESCENS: NOT DETECTED
STAPHYLOCOCCUS AUREUS BCID: NOT DETECTED
STAPHYLOCOCCUS SPECIES: NOT DETECTED
STREPTOCOCCUS AGALACTIAE: NOT DETECTED
STREPTOCOCCUS PNEUMONIAE: NOT DETECTED
Streptococcus pyogenes: NOT DETECTED
Streptococcus species: NOT DETECTED

## 2017-04-18 LAB — CK TOTAL AND CKMB (NOT AT ARMC)
CK, MB: 11.8 ng/mL — ABNORMAL HIGH (ref 0.5–5.0)
RELATIVE INDEX: 0.8 (ref 0.0–2.5)
Total CK: 1398 U/L — ABNORMAL HIGH (ref 38–234)

## 2017-04-18 LAB — TROPONIN I
TROPONIN I: 0.08 ng/mL — AB (ref <0.03)
TROPONIN I: 0.14 ng/mL — AB (ref <0.03)
Troponin I: 0.15 ng/mL (ref <0.03)

## 2017-04-18 LAB — CK: CK TOTAL: 1842 U/L — AB (ref 38–234)

## 2017-04-18 LAB — MAGNESIUM: Magnesium: 1.5 mg/dL — ABNORMAL LOW (ref 1.7–2.4)

## 2017-04-18 MED ORDER — POTASSIUM CHLORIDE IN NACL 20-0.9 MEQ/L-% IV SOLN
INTRAVENOUS | Status: AC
  Administered 2017-04-18 (×3): via INTRAVENOUS
  Filled 2017-04-18 (×3): qty 1000

## 2017-04-18 MED ORDER — MAGNESIUM SULFATE 2 GM/50ML IV SOLN
2.0000 g | Freq: Once | INTRAVENOUS | Status: AC
  Administered 2017-04-18: 2 g via INTRAVENOUS
  Filled 2017-04-18: qty 50

## 2017-04-18 MED ORDER — ACETAMINOPHEN 325 MG PO TABS
325.0000 mg | ORAL_TABLET | Freq: Once | ORAL | Status: AC
  Administered 2017-04-18: 325 mg via ORAL
  Filled 2017-04-18: qty 1

## 2017-04-18 MED ORDER — NALTREXONE-BUPROPION HCL ER 8-90 MG PO TB12: 1.0000 | ORAL_TABLET | Freq: Every day | ORAL | Status: DC

## 2017-04-18 MED ORDER — ALPRAZOLAM 0.5 MG PO TABS
0.5000 mg | ORAL_TABLET | Freq: Once | ORAL | Status: AC
  Administered 2017-04-18: 0.5 mg via ORAL
  Filled 2017-04-18: qty 1

## 2017-04-18 MED ORDER — HYDRALAZINE HCL 20 MG/ML IJ SOLN: 10.0000 mg | INTRAMUSCULAR | Status: DC | PRN

## 2017-04-18 MED ORDER — ORAL CARE MOUTH RINSE
15.0000 mL | Freq: Two times a day (BID) | OROMUCOSAL | Status: DC
  Administered 2017-04-18 – 2017-04-19 (×4): 15 mL via OROMUCOSAL

## 2017-04-18 MED ORDER — SODIUM CHLORIDE 0.9 % IV SOLN: INTRAVENOUS | Status: DC

## 2017-04-18 MED ORDER — CEFTRIAXONE SODIUM 1 G IJ SOLR
1.0000 g | INTRAMUSCULAR | Status: AC
  Administered 2017-04-18: 1 g via INTRAVENOUS
  Filled 2017-04-18: qty 10

## 2017-04-18 MED ORDER — ENOXAPARIN SODIUM 40 MG/0.4ML ~~LOC~~ SOLN
40.0000 mg | Freq: Every day | SUBCUTANEOUS | Status: DC
  Administered 2017-04-18 – 2017-04-20 (×3): 40 mg via SUBCUTANEOUS
  Filled 2017-04-18 (×3): qty 0.4

## 2017-04-18 MED ORDER — ENOXAPARIN SODIUM 40 MG/0.4ML ~~LOC~~ SOLN: 40.0000 mg | SUBCUTANEOUS | Status: DC

## 2017-04-18 MED ORDER — LEVALBUTEROL HCL 0.63 MG/3ML IN NEBU
0.6300 mg | INHALATION_SOLUTION | Freq: Four times a day (QID) | RESPIRATORY_TRACT | Status: DC
  Administered 2017-04-18 (×2): 0.63 mg via RESPIRATORY_TRACT
  Filled 2017-04-18 (×2): qty 3

## 2017-04-18 MED ORDER — ACETAMINOPHEN 325 MG PO TABS
650.0000 mg | ORAL_TABLET | Freq: Four times a day (QID) | ORAL | Status: DC | PRN
  Administered 2017-04-18 – 2017-04-20 (×4): 650 mg via ORAL
  Filled 2017-04-18 (×4): qty 2

## 2017-04-18 MED ORDER — DEXTROSE 5 % IV SOLN
2.0000 g | INTRAVENOUS | Status: DC
  Administered 2017-04-19 – 2017-04-20 (×2): 2 g via INTRAVENOUS
  Filled 2017-04-18 (×2): qty 2

## 2017-04-18 MED ORDER — ASPIRIN EC 81 MG PO TBEC
81.0000 mg | DELAYED_RELEASE_TABLET | Freq: Every day | ORAL | Status: DC
  Administered 2017-04-18 – 2017-04-20 (×3): 81 mg via ORAL
  Filled 2017-04-18 (×3): qty 1

## 2017-04-18 MED ORDER — TIMOLOL MALEATE 0.5 % OP SOLN
1.0000 [drp] | Freq: Every day | OPHTHALMIC | Status: DC
  Administered 2017-04-18 – 2017-04-20 (×3): 1 [drp] via OPHTHALMIC
  Filled 2017-04-18: qty 5

## 2017-04-18 MED ORDER — ACETAMINOPHEN 650 MG RE SUPP: 650.0000 mg | Freq: Four times a day (QID) | RECTAL | Status: DC | PRN

## 2017-04-18 MED ORDER — TRAZODONE HCL 50 MG PO TABS
200.0000 mg | ORAL_TABLET | Freq: Every day | ORAL | Status: DC
  Administered 2017-04-18 – 2017-04-19 (×2): 200 mg via ORAL
  Filled 2017-04-18 (×2): qty 4

## 2017-04-18 MED ORDER — IPRATROPIUM BROMIDE 0.02 % IN SOLN
0.5000 mg | Freq: Four times a day (QID) | RESPIRATORY_TRACT | Status: DC | PRN
  Filled 2017-04-18: qty 2.5

## 2017-04-18 MED ORDER — POTASSIUM CHLORIDE CRYS ER 20 MEQ PO TBCR
60.0000 meq | EXTENDED_RELEASE_TABLET | Freq: Once | ORAL | Status: AC
  Administered 2017-04-18: 60 meq via ORAL
  Filled 2017-04-18: qty 3

## 2017-04-18 MED ORDER — LEVALBUTEROL HCL 0.63 MG/3ML IN NEBU
0.6300 mg | INHALATION_SOLUTION | Freq: Three times a day (TID) | RESPIRATORY_TRACT | Status: DC
  Administered 2017-04-19 – 2017-04-20 (×4): 0.63 mg via RESPIRATORY_TRACT
  Filled 2017-04-18 (×4): qty 3

## 2017-04-18 MED ORDER — PANTOPRAZOLE SODIUM 40 MG PO TBEC
40.0000 mg | DELAYED_RELEASE_TABLET | Freq: Every day | ORAL | Status: DC
  Administered 2017-04-18 – 2017-04-20 (×3): 40 mg via ORAL
  Filled 2017-04-18 (×3): qty 1

## 2017-04-18 MED ORDER — LATANOPROST 0.005 % OP SOLN
1.0000 [drp] | Freq: Every day | OPHTHALMIC | Status: DC
  Administered 2017-04-18 – 2017-04-19 (×2): 1 [drp] via OPHTHALMIC
  Filled 2017-04-18: qty 2.5

## 2017-04-18 NOTE — Progress Notes (Signed)
PHARMACY - PHYSICIAN COMMUNICATION CRITICAL VALUE ALERT - BLOOD CULTURE IDENTIFICATION (BCID)  Results for orders placed or performed during the hospital encounter of 04/17/17  Blood Culture ID Panel (Reflexed) (Collected: 04/17/2017  8:11 PM)  Result Value Ref Range   Enterococcus species NOT DETECTED NOT DETECTED   Listeria monocytogenes NOT DETECTED NOT DETECTED   Staphylococcus species NOT DETECTED NOT DETECTED   Staphylococcus aureus NOT DETECTED NOT DETECTED   Streptococcus species NOT DETECTED NOT DETECTED   Streptococcus agalactiae NOT DETECTED NOT DETECTED   Streptococcus pneumoniae NOT DETECTED NOT DETECTED   Streptococcus pyogenes NOT DETECTED NOT DETECTED   Acinetobacter baumannii NOT DETECTED NOT DETECTED   Enterobacteriaceae species DETECTED (A) NOT DETECTED   Enterobacter cloacae complex NOT DETECTED NOT DETECTED   Escherichia coli DETECTED (A) NOT DETECTED   Klebsiella oxytoca NOT DETECTED NOT DETECTED   Klebsiella pneumoniae NOT DETECTED NOT DETECTED   Proteus species NOT DETECTED NOT DETECTED   Serratia marcescens NOT DETECTED NOT DETECTED   Carbapenem resistance NOT DETECTED NOT DETECTED   Haemophilus influenzae NOT DETECTED NOT DETECTED   Neisseria meningitidis NOT DETECTED NOT DETECTED   Pseudomonas aeruginosa NOT DETECTED NOT DETECTED   Candida albicans NOT DETECTED NOT DETECTED   Candida glabrata NOT DETECTED NOT DETECTED   Candida krusei NOT DETECTED NOT DETECTED   Candida parapsilosis NOT DETECTED NOT DETECTED   Candida tropicalis NOT DETECTED NOT DETECTED    Name of physician (or Provider) Contacted: Tylene Fantasia  Changes to prescribed antibiotics required: none, continue Ceftriaxone 2g IV q24h  Hershal Coria 04/18/2017  8:12 PM

## 2017-04-18 NOTE — Progress Notes (Signed)
Patient in bed resting, Wheezing/SOB/trembling resolved. Grandson at the bedside. Will continue to monitor patient.

## 2017-04-18 NOTE — Progress Notes (Signed)
Pharmacy Antibiotic Note  [name] is a 83 y.o. female c/o fall and weakness admitted on 04/17/2017 with sepsis secondary to UTI.  Pharmacy has been consulted for rocephin dosing.  Plan: Rocephin 2 Gm IV q24h F/u cultures   Height: 5\' 10"  (177.8 cm) Weight: 212 lb 11.9 oz (96.5 kg) IBW/kg (Calculated) : 68.5  Temp (24hrs), Avg:100.1 F (37.8 C), Min:98 F (36.7 C), Max:104.9 F (40.5 C)   Recent Labs Lab 04/17/17 2011 04/17/17 2016 04/17/17 2251  WBC 13.0*  --   --   CREATININE 1.21*  --   --   LATICACIDVEN  --  1.08 0.56    Estimated Creatinine Clearance: 43.5 mL/min (A) (by C-G formula based on SCr of 1.21 mg/dL (H)).    Allergies  Allergen Reactions  . Procaine     Novocaine caused rash and face blisters.  . Epinephrine     Rapid heart beat    Antimicrobials this admission: 5/15 rocephin >>    >>   Dose adjustments this admission:   Microbiology results:  BCx:   UCx:    Sputum:    MRSA PCR:   Thank you for allowing pharmacy to be a part of this patient's care.  Dorrene German 04/18/2017 2:47 AM

## 2017-04-18 NOTE — H&P (Addendum)
History and Physical    KITANA GAGE NFA:[id number] DOB: July 28, 1930 DOA: 04/17/2017  PCP: Kristie Cowman, MD   Patient coming from: Home.  Chief Complaint: Weakness.  HPI: [name] is a 83 y.o. female with history of hypertension, hyperlipidemia was brought to the ER after patient was finding it difficult to ambulate after a fall. Patient states she was treated for pneumonia 3 weeks ago. 2 days ago patient started developing dysuria with sensation of fever and chills. Yesterday when patient was trying to stand up from sitting position patient lost balance and fell backwards. In the process patient did hit her head but did not lose consciousness. She was not able to stand up for at least 8 hours when patients son's phone or on the floor. Since patient was very weak patient was brought to the ER.   ED Course: In the ER patient was found to be febrile temperature of around 104F, with blood pressure in the low normal range, tachycardic and leukocytosis with chest x-ray does not show anything acute and urine analysis compatible with UTI. Labs also show hypokalemia and rhabdomyolysis. EKG was showing normal sinus rhythm. Patient was given fluid bolus started on empiric antibiotics for UTI with fluids for possible sepsis. Blood cultures were obtained.  Review of Systems: As per HPI, rest all negative.   Past Medical History:  Diagnosis Date  . Anemia   . Arthritis   . Complication of anesthesia    "I GOT PNEUMONIA THE NEXT DAY FROM THE ANESTHESIA"  . Depression   . Fibromyalgia   . GERD (gastroesophageal reflux disease)   . History of skin cancer   . HLD (hyperlipidemia)   . HTN (hypertension)   . Internal hemorrhoids   . Osteoarthritis   . Peripheral neuropathy   . Shortness of breath   . Skin cancer   . SUI (stress urinary incontinence, female)   . Ulcerative colitis     Past Surgical History:  Procedure Laterality Date  . ABDOMINAL HYSTERECTOMY    . APPENDECTOMY      . BREAST EXCISIONAL BIOPSY Left 08/10/2011    Dr Margot Chimes  . BREAST SURGERY    . CARPAL TUNNEL RELEASE Left   . CESAREAN SECTION    . CHOLECYSTECTOMY    . COLONOSCOPY  04/29/2003   internal hemorrhoids  . JOINT REPLACEMENT Bilateral 2010 and 2012   KNEES  . TOTAL HIP ARTHROPLASTY Right 08/06/2014   Procedure: RIGHT TOTAL HIP ARTHROPLASTY ANTERIOR APPROACH;  Surgeon: Gearlean Alf, MD;  Location: [facility];  Service: Orthopedics;  Laterality: Right;  . TOTAL HIP ARTHROPLASTY Left 12/17/2014   Procedure: LEFT TOTAL HIP ARTHROPLASTY ANTERIOR APPROACH;  Surgeon: Gearlean Alf, MD;  Location: [facility];  Service: Orthopedics;  Laterality: Left;     reports that she has never smoked. She has never used smokeless tobacco. She reports that she does not drink alcohol or use drugs.  Allergies  Allergen Reactions  . Procaine     Novocaine caused rash and face blisters.  . Epinephrine     Rapid heart beat    Family History  Problem Relation Age of Onset  . Colon cancer Mother 67  . Diabetes Brother     Prior to Admission medications   Medication Sig Start Date End Date Taking? Authorizing Provider  aspirin EC 81 MG tablet Take 81 mg by mouth daily.   Yes [provider]  bimatoprost (LUMIGAN) 0.01 % SOLN Place 1 drop into both eyes  at bedtime.   Yes [provider]  CONTRAVE 8-90 MG TB12 Take 1 tablet by mouth daily. 03/28/17  Yes [provider]  Docusate Calcium (STOOL SOFTENER PO) Take 100 mg by mouth 2 (two) times daily.    Yes [provider]  estradiol (CLIMARA - DOSED IN MG/24 HR) 0.05 mg/24hr patch Place 1 patch onto the skin once a week. Changes every thursday 06/02/15  Yes [provider]  guaiFENesin-codeine 100-10 MG/5ML syrup Take 10 mLs by mouth every 4 (four) hours. 03/24/17  Yes [provider]  lisinopril (PRINIVIL,ZESTRIL) 30 MG tablet Take 30 mg by mouth daily.   Yes [provider]  lisinopril-hydrochlorothiazide  (PRINZIDE,ZESTORETIC) 20-25 MG per tablet Take 1 tablet by mouth every morning.   Yes [provider]  methocarbamol (ROBAXIN) 500 MG tablet Take 1 tablet (500 mg total) by mouth every 6 (six) hours as needed for muscle spasms. 12/18/14  Yes Perkins, Alexzandrew L, PA-C  Miconazole Nitrate-Wipes (MONISTAT 3 TRIPLE ACTION VA) Place 1 suppository vaginally daily.   Yes [provider]  Multiple Vitamins-Minerals (SENIOR VITES PO) Take 1 tablet by mouth daily.   Yes [provider]  omeprazole (PRILOSEC) 40 MG capsule Take 40 mg by mouth daily.   Yes [provider]  simvastatin (ZOCOR) 20 MG tablet Take 20 mg by mouth at bedtime.    Yes [provider]  timolol (TIMOPTIC) 0.5 % ophthalmic solution Place 1 drop into both eyes daily after breakfast.   Yes [provider]  traZODone (DESYREL) 100 MG tablet Take 200 mg by mouth at bedtime.    Yes [provider]  hydrochlorothiazide (HYDRODIURIL) 25 MG tablet Take 25 mg by mouth every morning.  02/03/14   [provider]    Physical Exam: Vitals:   04/17/17 2230 04/17/17 2251 04/18/17 0037 04/18/17 0101  BP: 102/65  104/62 (!) 109/59  Pulse: 90  75 72  Resp: (!) 30  (!) 24 20  Temp:  100.2 F (37.9 C) 98 F (36.7 C) 98.2 F (36.8 C)  TempSrc:  Oral Oral Oral  SpO2: 91%  100% 98%  Weight:    96.5 kg (212 lb 11.9 oz)  Height:    5\' 10"  (1.778 m)      Constitutional: Moderately built and nourished. Vitals:   04/17/17 2230 04/17/17 2251 04/18/17 0037 04/18/17 0101  BP: 102/65  104/62 (!) 109/59  Pulse: 90  75 72  Resp: (!) 30  (!) 24 20  Temp:  100.2 F (37.9 C) 98 F (36.7 C) 98.2 F (36.8 C)  TempSrc:  Oral Oral Oral  SpO2: 91%  100% 98%  Weight:    96.5 kg (212 lb 11.9 oz)  Height:    5\' 10"  (1.778 m)   Eyes: Anicteric. No pallor. ENMT: No discharge from the ears eyes nose and mouth. Neck: No mass felt. No JVD appreciated. Respiratory: No rhonchi or  crepitations. Cardiovascular: S1 and S2 heard no murmurs appreciated. Abdomen: Soft nontender bowel sounds present. Musculoskeletal: No edema. No joint effusion. Skin: No rash. Skin appears warm. Neurologic: Alert awake oriented to time place and person. Moves all extremities 5 x 5. No facial asymmetry. Tongue is midline. Psychiatric: Appears normal. Normal affect.   Labs on Admission: I have personally reviewed following labs and imaging studies  CBC:  Recent Labs Lab 04/17/17 2011  WBC 13.0*  NEUTROABS 11.3*  HGB 11.9*  HCT 34.8*  MCV 86.8  PLT 811   Basic Metabolic  Panel:  Recent Labs Lab 04/17/17 2011  NA 134*  K 2.7*  CL 99*  CO2 23  GLUCOSE 132*  BUN 31*  CREATININE 1.21*  CALCIUM 8.7*   GFR: Estimated Creatinine Clearance: 43.5 mL/min (A) (by C-G formula based on SCr of 1.21 mg/dL (H)). Liver Function Tests:  Recent Labs Lab 04/17/17 2011  AST 62*  ALT 21  ALKPHOS 48  BILITOT 1.6*  PROT 6.4*  ALBUMIN 3.4*   No results for input(s): LIPASE, AMYLASE in the last 168 hours. No results for input(s): AMMONIA in the last 168 hours. Coagulation Profile:  Recent Labs Lab 04/17/17 2011  INR 1.16   Cardiac Enzymes:  Recent Labs Lab 04/17/17 2011  CKTOTAL 2,234*   BNP (last 3 results) No results for input(s): PROBNP in the last 8760 hours. HbA1C: No results for input(s): HGBA1C in the last 72 hours. CBG: No results for input(s): GLUCAP in the last 168 hours. Lipid Profile: No results for input(s): CHOL, HDL, LDLCALC, TRIG, CHOLHDL, LDLDIRECT in the last 72 hours. Thyroid Function Tests: No results for input(s): TSH, T4TOTAL, FREET4, T3FREE, THYROIDAB in the last 72 hours. Anemia Panel: No results for input(s): VITAMINB12, FOLATE, FERRITIN, TIBC, IRON, RETICCTPCT in the last 72 hours. Urine analysis:    Component Value Date/Time   COLORURINE AMBER (A) 04/17/2017 2252   APPEARANCEUR CLOUDY (A) 04/17/2017 2252   LABSPEC 1.018 04/17/2017 2252    PHURINE 5.0 04/17/2017 2252   GLUCOSEU NEGATIVE 04/17/2017 2252   HGBUR LARGE (A) 04/17/2017 2252   BILIRUBINUR NEGATIVE 04/17/2017 2252   KETONESUR 20 (A) 04/17/2017 2252   PROTEINUR 100 (A) 04/17/2017 2252   UROBILINOGEN 0.2 12/11/2014 1120   NITRITE NEGATIVE 04/17/2017 2252   LEUKOCYTESUR LARGE (A) 04/17/2017 2252   Sepsis Labs: @LABRCNTIP (procalcitonin:4,lacticidven:4) )No results found for this or any previous visit (from the past 240 hour(s)).   Radiological Exams on Admission: Dg Chest 2 View  Result Date: 04/17/2017 CLINICAL DATA:  PT RECEIVED FROM HOME VIA EMS FOR A FALL. PER EMS, PT HAD FALLEN EARLIER IN THE DAY AND WAS FOUND ON THE FLOOR BY HER FAMILY WHEN THEY GOT HOME FROM WORK. PT STS SHE HAD BEEN ON THE FLOOR FOR APPROXIMATELY 8 HOURS. EXAM: CHEST  2 VIEW COMPARISON:  07/31/2015 FINDINGS: Heart size is normal. Lungs are free of focal consolidations and pleural effusions. No pulmonary edema. Overlying the left lung apex, there is a nodular opacity, possibly representing artifact from EKG lead and normal structures. However, a pulmonary nodule is not excluded. No acute fractures or pneumothorax. IMPRESSION: 1. Question of left upper lobe pulmonary nodule. Further evaluation is indicated. Consider repeat PA view of the chest without EKG leads if the patient is able. Otherwise, consider CT of the chest. 2. No acute cardiopulmonary disease. Electronically Signed   By: Nolon Nations M.D.   On: 04/17/2017 20:43   Ct Head Wo Contrast  Result Date: 04/17/2017 CLINICAL DATA:  Patient fell earlier in the day.  Weakness x1 week. EXAM: CT HEAD WITHOUT CONTRAST CT CERVICAL SPINE WITHOUT CONTRAST TECHNIQUE: Multidetector CT imaging of the head and cervical spine was performed following the standard protocol without intravenous contrast. Multiplanar CT image reconstructions of the cervical spine were also generated. COMPARISON:  Cervical spine MRI 05/26/2009 FINDINGS: CT HEAD FINDINGS  BRAIN: There is mild sulcal prominence consistent with superficial atrophy. No intraparenchymal hemorrhage, mass effect nor midline shift. Periventricular and subcortical white matter hypodensities consistent with chronic small vessel ischemic disease are identified. No acute large  vascular territory infarcts. No abnormal extra-axial fluid collections. Basal cisterns are not effaced and midline. VASCULAR: Moderate calcific atherosclerosis of the carotid siphons. SKULL: No skull fracture. No significant scalp soft tissue swelling. SINUSES/ORBITS: The mastoid air-cells are clear. The included paranasal sinuses are well-aerated.The included ocular globes and orbital contents are non-suspicious. OTHER: None. CT CERVICAL SPINE FINDINGS Alignment: Stone reversal cervical lordosis similar to prior exam with apex at C5. Skull base and vertebrae: No acute fracture. No primary bone lesion or focal pathologic process. Soft tissues and spinal canal: No prevertebral fluid or swelling. No visible canal hematoma. Disc levels: Mild disc space narrowing C3-4 with moderate to marked disc space narrowing noted at C4-5, C5-6 and C6-7. Small posterior marginal osteophytes contribute to C5-6 and C6-7 mild bilateral neural foraminal encroachment. Bilateral multilevel facet arthropathy with joint space narrowing and facet hypertrophy is seen. Uncovertebral joint osteoarthritis is identified on the left at C3-4, bilaterally at C4-5, C5-6 and C6-7. Upper chest: Nonacute Other: None IMPRESSION: 1. No acute intracranial abnormality.  Mild superficial atrophy. 2. Cervical spondylosis without acute posttraumatic fracture or subluxations. Electronically Signed   By: Ashley Royalty M.D.   On: 04/17/2017 21:37   Ct Cervical Spine Wo Contrast  Result Date: 04/17/2017 CLINICAL DATA:  Patient fell earlier in the day.  Weakness x1 week. EXAM: CT HEAD WITHOUT CONTRAST CT CERVICAL SPINE WITHOUT CONTRAST TECHNIQUE: Multidetector CT imaging of the head  and cervical spine was performed following the standard protocol without intravenous contrast. Multiplanar CT image reconstructions of the cervical spine were also generated. COMPARISON:  Cervical spine MRI 05/26/2009 FINDINGS: CT HEAD FINDINGS BRAIN: There is mild sulcal prominence consistent with superficial atrophy. No intraparenchymal hemorrhage, mass effect nor midline shift. Periventricular and subcortical white matter hypodensities consistent with chronic small vessel ischemic disease are identified. No acute large vascular territory infarcts. No abnormal extra-axial fluid collections. Basal cisterns are not effaced and midline. VASCULAR: Moderate calcific atherosclerosis of the carotid siphons. SKULL: No skull fracture. No significant scalp soft tissue swelling. SINUSES/ORBITS: The mastoid air-cells are clear. The included paranasal sinuses are well-aerated.The included ocular globes and orbital contents are non-suspicious. OTHER: None. CT CERVICAL SPINE FINDINGS Alignment: Stone reversal cervical lordosis similar to prior exam with apex at C5. Skull base and vertebrae: No acute fracture. No primary bone lesion or focal pathologic process. Soft tissues and spinal canal: No prevertebral fluid or swelling. No visible canal hematoma. Disc levels: Mild disc space narrowing C3-4 with moderate to marked disc space narrowing noted at C4-5, C5-6 and C6-7. Small posterior marginal osteophytes contribute to C5-6 and C6-7 mild bilateral neural foraminal encroachment. Bilateral multilevel facet arthropathy with joint space narrowing and facet hypertrophy is seen. Uncovertebral joint osteoarthritis is identified on the left at C3-4, bilaterally at C4-5, C5-6 and C6-7. Upper chest: Nonacute Other: None IMPRESSION: 1. No acute intracranial abnormality.  Mild superficial atrophy. 2. Cervical spondylosis without acute posttraumatic fracture or subluxations. Electronically Signed   By: Ashley Royalty M.D.   On: 04/17/2017 21:37      EKG: Independently reviewed. Sinus tachycardia.  Assessment/Plan Principal Problem:   Sepsis ([place]) Active Problems:   Essential hypertension   Non-traumatic rhabdomyolysis   Lower urinary tract infectious disease   Hypokalemia    1. Sepsis from UTI - patient is placed on ceftriaxone. Follow cultures. Continue hydration. 2. Rhabdomyolysis probably from fall - continue with hydration and follow CK levels. 3. Hypertension - holding off antihypertensive secondary to low normal blood pressure. When necessary IV hydralazine for  systolic blood pressure more than 160. Restart antihypertensives once patient's blood pressure is more stable. 4. Acute renal failure probably from low normal blood pressure and dehydration - holding on lisinopril hydrochlorothiazide secondary to acute renal failure and low normal blood pressure. Continue with hydration and closely follow metabolic panel. 5. Hypokalemia probably from diuretics - replace and recheck. Check magnesium levels. 6. History of hyperlipidemia - holding statins due to rhabdomyolysis. 7. Mildly elevated LFTs - probably secondary to rhabdomyolysis. If continues to be elevated may need further radiological exams and further tests. 8. Normocytic normochromic anemia patient be chronic. Further workup as outpatient.  Patient's chest x-ray shows pulmonary nodule. Will need repeat chest x-ray two-views without chest leads once stable.   DVT prophylaxis: Lovenox. Code Status: Full code.  Family Communication: Discussed with patient.  Disposition Plan: Home.  Consults called: None.  Admission status: Inpatient.    Rise Patience MD [facility] Pager [phone].  If 7PM-7AM, please contact night-coverage [email or web address] Password [facility]  04/18/2017, 2:34 AM

## 2017-04-18 NOTE — Progress Notes (Signed)
CRITICAL VALUE ALERT  Critical value received:  Troponin 0.15  Date of notification:  04/18/17  Time of notification:  0728  Critical value read back:Yes.    Nurse who received alert:  Sheffield Slider  MD notified (1st page):  Dr. Alfredia Ferguson   Time of first page:  0830  MD notified (2nd page):  Time of second page:  Responding MD:  Dr. Alfredia Ferguson  Time MD responded:  2182

## 2017-04-18 NOTE — Progress Notes (Signed)
Patient was admitted early this AM after midnight and H and P has been reviewed and I am in current agreement with the Assesment and Plan done by Dr. Hal Hope. Patient is an 83 yo female with a PMH of HTN, HLD, Anemia, Arthritis, Depression, GERD, Fibromyaliia, Peripheral Neuropathy and other comorbid conditions who presented to [facility] for weakness and fall. She was just treated for a PNA 3 weeks ago and 2 days ago she developed dysuria with fever and chills. She fell trying to get up from a seated position and was on the floor for several hours until family found her. She was brought in and found to be Septic 2/2 to UTI so she was started on IV Ceftriaxone. Patient was also found to have mild Rhabdomyolysis and an AKI. Patient currently being given IVF with NS at 125 mL/hr. Hypokalemia has been improved with repletion and Leukocytosis resolved. Patient's Troponin level was 0.15 but likely from Rhabdomyolysis and will continue to trend. Patient's CK also trending down from 2,234 -> 1842. We will continue to monitor patient extremely closely and obtain PT consultation as well. Will continue to monitor patient's clinical response to current interventions and repeat bloodwork in AM.

## 2017-04-18 NOTE — Progress Notes (Signed)
Patient shaky, wheezing and SOB, oxygen 92%-RA, Dr. Alfredia Ferguson notified and ordered CXR, breathing treatment, given as ordered and also patient requesting for xanax, informed MD and order given. Will continue to monitor patient.

## 2017-04-18 NOTE — Evaluation (Addendum)
Physical Therapy Evaluation Patient Details Name: [name] MRN: [id number] DOB: December 01, 1930 Today's Date: 04/18/2017   History of Present Illness  83 yo female admitted with sepsis, fall, UTI. Hx of fibromyalgia, OA, ulcerative colitis, chronic pain.  Clinical Impression  On eval, pt required Min assist +2 for mobility. Pt was able to stand and take a few lateral steps toward [facility] with 2 HHA. All mobility task were very effortful for pt. Dyspnea 3-4/4 on 4L O2 with minimal activity. Significant tremor noted as well. Pt presents with general weakness, decreased activity tolerance, and impaired gait and balance. Recommendation is for ST rehab at SNF at this time. Will follow and progress activity as pt is able to tolerate.     Follow Up Recommendations SNF;Supervision/Assistance - 24 hour    Equipment Recommendations  None recommended by PT    Recommendations for Other Services       Precautions / Restrictions Precautions Precautions: Fall Restrictions Weight Bearing Restrictions: No      Mobility  Bed Mobility Overal bed mobility: Needs Assistance Bed Mobility: Supine to Sit     Supine to sit: Mod assist;HOB elevated     General bed mobility comments: Assist for trunk and LEs. Increased time. Task was effortful for pt. Rest break needed before proceeding with standing  Transfers Overall transfer level: Needs assistance Equipment used: 1 person hand held assist;2 person hand held assist Transfers: Sit to/from Stand Sit to Stand: From elevated surface;+2 physical assistance;+2 safety/equipment;Min assist         General transfer comment:  2 attempts. Stood x1 with 1 HHA-very unsteady. Pt sat back down abruptly. Stood x 1 with 2 HHA, elevated surface. Task was effortful for pt. Dyspnea 3/4.   Ambulation/Gait             General Gait Details: Side steps along side of bed with 2 HHA. Dyspnea 4/4.   Stairs            Wheelchair Mobility    Modified  Rankin (Stroke Patients Only)       Balance Overall balance assessment: Needs assistance;History of Falls         Standing balance support: Bilateral upper extremity supported Standing balance-Leahy Scale: Poor                               Pertinent Vitals/Pain Pain Assessment: Faces Faces Pain Scale: Hurts little more Pain Location: head Pain Descriptors / Indicators: Aching Pain Intervention(s): Limited activity within patient's tolerance;Repositioned    Home Living Family/patient expects to be discharged to:: Private residence Living Arrangements: Children Available Help at Discharge: Family Type of Home: House Home Access: Stairs to enter     Home Layout: Able to live on main level with bedroom/bathroom Home Equipment: Environmental consultant - 2 wheels;Cane - quad      Prior Function Level of Independence: Independent with assistive device(s)         Comments: uses RW vs cane (lately using walker more)     Hand Dominance        Extremity/Trunk Assessment   Upper Extremity Assessment Upper Extremity Assessment: Generalized weakness    Lower Extremity Assessment Lower Extremity Assessment: Generalized weakness    Cervical / Trunk Assessment Cervical / Trunk Assessment: Kyphotic  Communication   Communication: No difficulties  Cognition Arousal/Alertness: Awake/alert Behavior During Therapy: Anxious Overall Cognitive Status: Within Functional Limits for tasks assessed  General Comments      Exercises     Assessment/Plan    PT Assessment Patient needs continued PT services  PT Problem List Decreased strength;Decreased mobility;Decreased balance;Decreased knowledge of use of DME;Decreased activity tolerance;Pain;Cardiopulmonary status limiting activity       PT Treatment Interventions DME instruction;Therapeutic activities;Therapeutic exercise;Gait training;Patient/family  education;Balance training;Functional mobility training    PT Goals (Current goals can be found in the Care Plan section)  Acute Rehab PT Goals Patient Stated Goal: to get better PT Goal Formulation: With patient Time For Goal Achievement: 05/02/17 Potential to Achieve Goals: Good    Frequency Min 3X/week   Barriers to discharge        Co-evaluation               AM-PAC PT "6 Clicks" Daily Activity  Outcome Measure Difficulty turning over in bed (including adjusting bedclothes, sheets and blankets)?: A Little Difficulty moving from lying on back to sitting on the side of the bed? : A Little Difficulty sitting down on and standing up from a chair with arms (e.g., wheelchair, bedside commode, etc,.)?: A Lot Help needed moving to and from a bed to chair (including a wheelchair)?: A Lot Help needed walking in hospital room?: A Lot Help needed climbing 3-5 steps with a railing? : Total 6 Click Score: 13    End of Session Equipment Utilized During Treatment: Gait belt Activity Tolerance: Patient limited by fatigue Patient left: in bed;with call bell/phone within reach;with bed alarm set;with family/visitor present   PT Visit Diagnosis: Muscle weakness (generalized) (M62.81);Difficulty in walking, not elsewhere classified (R26.2)    Time: 8182-9937 PT Time Calculation (min) (ACUTE ONLY): 10 min   Charges:   PT Evaluation $PT Eval Moderate Complexity: 1 Procedure     PT G Codes:          Weston Anna, MPT Pager: [phone]

## 2017-04-19 DIAGNOSIS — N390 Urinary tract infection, site not specified: Secondary | ICD-10-CM

## 2017-04-19 DIAGNOSIS — A4151 Sepsis due to Escherichia coli [E. coli]: Principal | ICD-10-CM

## 2017-04-19 DIAGNOSIS — N179 Acute kidney failure, unspecified: Secondary | ICD-10-CM

## 2017-04-19 DIAGNOSIS — E876 Hypokalemia: Secondary | ICD-10-CM

## 2017-04-19 DIAGNOSIS — M6282 Rhabdomyolysis: Secondary | ICD-10-CM

## 2017-04-19 DIAGNOSIS — R7881 Bacteremia: Secondary | ICD-10-CM

## 2017-04-19 LAB — COMPREHENSIVE METABOLIC PANEL
ALBUMIN: 2.6 g/dL — AB (ref 3.5–5.0)
ALT: 27 U/L (ref 14–54)
AST: 49 U/L — AB (ref 15–41)
Alkaline Phosphatase: 38 U/L (ref 38–126)
Anion gap: 6 (ref 5–15)
BILIRUBIN TOTAL: 0.5 mg/dL (ref 0.3–1.2)
BUN: 21 mg/dL — AB (ref 6–20)
CO2: 22 mmol/L (ref 22–32)
CREATININE: 0.7 mg/dL (ref 0.44–1.00)
Calcium: 8.3 mg/dL — ABNORMAL LOW (ref 8.9–10.3)
Chloride: 108 mmol/L (ref 101–111)
GFR calc Af Amer: >60 mL/min (ref >60)
GLUCOSE: 103 mg/dL — AB (ref 65–99)
POTASSIUM: 4 mmol/L (ref 3.5–5.1)
Sodium: 136 mmol/L (ref 135–145)
TOTAL PROTEIN: 5.3 g/dL — AB (ref 6.5–8.1)

## 2017-04-19 LAB — TROPONIN I: Troponin I: 0.06 ng/mL (ref <0.03)

## 2017-04-19 LAB — CBC WITH DIFFERENTIAL/PLATELET
BASOS ABS: 0 10*3/uL (ref 0.0–0.1)
BASOS PCT: 0 %
Eosinophils Absolute: 0 10*3/uL (ref 0.0–0.7)
Eosinophils Relative: 0 %
HEMATOCRIT: 29.8 % — AB (ref 36.0–46.0)
HEMOGLOBIN: 9.8 g/dL — AB (ref 12.0–15.0)
LYMPHS PCT: 12 %
Lymphs Abs: 0.6 10*3/uL — ABNORMAL LOW (ref 0.7–4.0)
MCH: 29 pg (ref 26.0–34.0)
MCHC: 32.9 g/dL (ref 30.0–36.0)
MCV: 88.2 fL (ref 78.0–100.0)
Monocytes Absolute: 0.2 10*3/uL (ref 0.1–1.0)
Monocytes Relative: 5 %
NEUTROS ABS: 4 10*3/uL (ref 1.7–7.7)
NEUTROS PCT: 83 %
Platelets: 109 10*3/uL — ABNORMAL LOW (ref 150–400)
RBC: 3.38 MIL/uL — AB (ref 3.87–5.11)
RDW: 15.3 % (ref 11.5–15.5)
WBC: 4.8 10*3/uL (ref 4.0–10.5)

## 2017-04-19 LAB — CK TOTAL AND CKMB (NOT AT ARMC)
CK, MB: 7.7 ng/mL — AB (ref 0.5–5.0)
RELATIVE INDEX: 1.5 (ref 0.0–2.5)
Total CK: 519 U/L — ABNORMAL HIGH (ref 38–234)

## 2017-04-19 LAB — PHOSPHORUS: Phosphorus: 1.5 mg/dL — ABNORMAL LOW (ref 2.5–4.6)

## 2017-04-19 LAB — MAGNESIUM: MAGNESIUM: 1.9 mg/dL (ref 1.7–2.4)

## 2017-04-19 MED ORDER — MAGNESIUM SULFATE 2 GM/50ML IV SOLN
2.0000 g | Freq: Once | INTRAVENOUS | Status: AC
  Administered 2017-04-19: 2 g via INTRAVENOUS
  Filled 2017-04-19: qty 50

## 2017-04-19 NOTE — Clinical Social Work Note (Signed)
Clinical Social Work Assessment  Patient Details  Name: [name] MRN: [id number] Date of Birth: October 31, 1930  Date of referral:  04/19/17               Reason for consult:  Facility Placement                Permission sought to share information with:  Family Supports Permission granted to share information::     Name::        Agency::  SNF  Relationship::  Sons  Contact Information:     Housing/Transportation Living arrangements for the past 2 months:  Single Family Home Source of Information:  Patient Patient Interpreter Needed:  None Criminal Activity/Legal Involvement Pertinent to Current Situation/Hospitalization:  No - Comment as needed Significant Relationships:  None Lives with:  Self Do you feel safe going back to the place where you live?  Yes Need for family participation in patient care:  No (Coment)  Care giving concerns:  Patient admitted for Sepsis. PT recommends SNF placement.    Social Worker assessment / plan:  CSW met with patient at bedside, explain role and csw intervention. Patient oriented x4, pleasant and agreeable to talk. Patient express she has been to rehab in the past and is agreeable to return to [facility] if bed there is bed availability, she reports having a great experience there. CSW educated patient about faxing out documents and presenting a bed list if [facility] was not available. Patient agreeable.  Plan: Assist with patient discharge to SNF.    Employment status:  Retired Forensic scientist:  Medicare PT Recommendations:  [place] / Referral to community resources:  [place]  Patient/Family's Response to care:  Agreeable and responding well to care.   Patient/Family's Understanding of and Emotional Response to Diagnosis, Current Treatment, and Prognosis:  No family at side.   Emotional Assessment Appearance:  Developmentally appropriate Attitude/Demeanor/Rapport:    Affect  (typically observed):  Pleasant Orientation:  Oriented to Self, Oriented to Place, Oriented to  Time, Oriented to Situation Alcohol / Substance use:  Not Applicable Psych involvement (Current and /or in the community):  No (Comment)  Discharge Needs  Concerns to be addressed:  Discharge Planning Concerns Readmission within the last 30 days:  No Current discharge risk:  None Barriers to Discharge:  Continued Medical Work up   [employer], LCSW 04/19/2017, 10:47 AM

## 2017-04-19 NOTE — Progress Notes (Signed)
PROGRESS NOTE  [name] TIW:[id number] DOB: Jul 26, 1930 DOA: 04/17/2017 PCP: Kristie Cowman, MD  HPI/Recap of past 24 hours:  Fever subsided, Report feeing better, denies pain, no sob, she has her make up on this am  Assessment/Plan: Principal Problem:   Sepsis ([place]) Active Problems:   Essential hypertension   Non-traumatic rhabdomyolysis   Lower urinary tract infectious disease   Hypokalemia  Sepsis with ecoli bacteremia and ecoli uti Home home bp meds Continue rocephin, repeat blood culture  rhabdomyolysis from fall at home She received hydration, ck trending down  Mild thrombocytopenia: likely from sepsis, expect improvement  Elevation of lft, likely from sepsis and rhabdo Monitor  Hypokalemia/hypomagnesemia; replace k/mag  AKI; likely from uti/sepsis/rhabdo Improving  H/o HTN: home bp meds held since admission, expect resume once bp improves  FTT: PT, SNF   Code Status: full  Family Communication: patient   Disposition Plan: SNF   Consultants:  none  Procedures:  none  Antibiotics:  rocephin   Objective: BP (!) 142/72 (BP Location: Right Arm)   Pulse 85   Temp 98.6 F (37 C) (Oral)   Resp 18   Ht 5\' 10"  (1.778 m)   Wt 99.9 kg (220 lb 3.8 oz)   SpO2 98%   BMI 31.60 kg/m   Intake/Output Summary (Last 24 hours) at 04/19/17 1932 Last data filed at 04/19/17 1445  Gross per 24 hour  Intake          1451.25 ml  Output              550 ml  Net           901.25 ml   Filed Weights   04/18/17 0101 04/18/17 0555 04/19/17 0508  Weight: 96.5 kg (212 lb 11.9 oz) 96.4 kg (212 lb 8.4 oz) 99.9 kg (220 lb 3.8 oz)    Exam:   General:  NAD  Cardiovascular: RRR  Respiratory: CTABL  Abdomen: Soft/ND/NT, positive BS  Musculoskeletal: No Edema  Neuro: aaox3  Data Reviewed: Basic Metabolic Panel:  Recent Labs Lab 04/17/17 2011 04/18/17 0555 04/19/17 0554  NA 134* 135 136  K 2.7* 3.9 4.0  CL 99* 107 108  CO2 23 19* 22    GLUCOSE 132* 115* 103*  BUN 31* 28* 21*  CREATININE 1.21* 0.91 0.70  CALCIUM 8.7* 8.0* 8.3*  MG  --  1.5* 1.9  PHOS  --   --  1.5*   Liver Function Tests:  Recent Labs Lab 04/17/17 2011 04/18/17 0555 04/19/17 0554  AST 62* 65* 49*  ALT 21 24 27   ALKPHOS 48 44 38  BILITOT 1.6* 1.0 0.5  PROT 6.4* 5.5* 5.3*  ALBUMIN 3.4* 2.8* 2.6*   No results for input(s): LIPASE, AMYLASE in the last 168 hours. No results for input(s): AMMONIA in the last 168 hours. CBC:  Recent Labs Lab 04/17/17 2011 04/18/17 0555 04/19/17 0554  WBC 13.0* 9.2 4.8  NEUTROABS 11.3* 8.1* 4.0  HGB 11.9* 10.8* 9.8*  HCT 34.8* 32.1* 29.8*  MCV 86.8 87.5 88.2  PLT 162 130* 109*   Cardiac Enzymes:    Recent Labs Lab 04/17/17 2011 04/18/17 0555 04/18/17 1341 04/18/17 1818 04/19/17 0027 04/19/17 0554  CKTOTAL 2,234* 1,842* 1,398*  --   --  519*  CKMB  --   --  11.8*  --   --  7.7*  TROPONINI  --  0.15* 0.08* 0.14* 0.06*  --    BNP (last 3 results) No results for input(s):  BNP in the last 8760 hours.  ProBNP (last 3 results) No results for input(s): PROBNP in the last 8760 hours.  CBG: No results for input(s): GLUCAP in the last 168 hours.  Recent Results (from the past 240 hour(s))  Culture, blood (Routine x 2)     Status: Abnormal (Preliminary result)   Collection Time: 04/17/17  8:11 PM  Result Value Ref Range Status   Specimen Description BLOOD BLOOD LEFT FOREARM  Final   Special Requests   Final    BOTTLES DRAWN AEROBIC AND ANAEROBIC Blood Culture adequate volume   Culture  Setup Time   Final    GRAM NEGATIVE RODS IN BOTH AEROBIC AND ANAEROBIC BOTTLES CRITICAL RESULT CALLED TO, READ BACK BY AND VERIFIED WITH: A PHAM PHARMD 2003 04/18/17 A BROWNING    Culture (A)  Final    ESCHERICHIA COLI SUSCEPTIBILITIES TO FOLLOW Performed at [facility], [address] [place], [place] [address]    Report Status PENDING  Incomplete  Blood Culture ID Panel (Reflexed)     Status:  Abnormal   Collection Time: 04/17/17  8:11 PM  Result Value Ref Range Status   Enterococcus species NOT DETECTED NOT DETECTED Final   Listeria monocytogenes NOT DETECTED NOT DETECTED Final   Staphylococcus species NOT DETECTED NOT DETECTED Final   Staphylococcus aureus NOT DETECTED NOT DETECTED Final   Streptococcus species NOT DETECTED NOT DETECTED Final   Streptococcus agalactiae NOT DETECTED NOT DETECTED Final   Streptococcus pneumoniae NOT DETECTED NOT DETECTED Final   Streptococcus pyogenes NOT DETECTED NOT DETECTED Final   Acinetobacter baumannii NOT DETECTED NOT DETECTED Final   Enterobacteriaceae species DETECTED (A) NOT DETECTED Final    Comment: Enterobacteriaceae represent a large family of gram-negative bacteria, not a single organism. CRITICAL RESULT CALLED TO, READ BACK BY AND VERIFIED WITH: A PHAM PHAMD 2003 04/18/17 A BROWNING    Enterobacter cloacae complex NOT DETECTED NOT DETECTED Final   Escherichia coli DETECTED (A) NOT DETECTED Final    Comment: CRITICAL RESULT CALLED TO, READ BACK BY AND VERIFIED WITH: A PHAM PHARMD 2003 04/18/17 A BROWNING    Klebsiella oxytoca NOT DETECTED NOT DETECTED Final   Klebsiella pneumoniae NOT DETECTED NOT DETECTED Final   Proteus species NOT DETECTED NOT DETECTED Final   Serratia marcescens NOT DETECTED NOT DETECTED Final   Carbapenem resistance NOT DETECTED NOT DETECTED Final   Haemophilus influenzae NOT DETECTED NOT DETECTED Final   Neisseria meningitidis NOT DETECTED NOT DETECTED Final   Pseudomonas aeruginosa NOT DETECTED NOT DETECTED Final   Candida albicans NOT DETECTED NOT DETECTED Final   Candida glabrata NOT DETECTED NOT DETECTED Final   Candida krusei NOT DETECTED NOT DETECTED Final   Candida parapsilosis NOT DETECTED NOT DETECTED Final   Candida tropicalis NOT DETECTED NOT DETECTED Final    Comment: Performed at [facility], [place] [address] [place], [place] [address]  Culture, blood (Routine x 2)     Status: None  (Preliminary result)   Collection Time: 04/17/17  8:14 PM  Result Value Ref Range Status   Specimen Description BLOOD LEFT HAND  Final   Special Requests   Final    BOTTLES DRAWN AEROBIC AND ANAEROBIC Blood Culture adequate volume   Culture  Setup Time   Final    GRAM NEGATIVE RODS CRITICAL VALUE NOTED.  VALUE IS CONSISTENT WITH PREVIOUSLY REPORTED AND CALLED VALUE. IN BOTH AEROBIC AND ANAEROBIC BOTTLES    Culture   Final    Lonell Grandchild  NEGATIVE RODS IDENTIFICATION AND SUSCEPTIBILITIES TO FOLLOW Performed at [facility], [place] [address] [place], [place] [address]    Report Status PENDING  Incomplete  Urine culture     Status: Abnormal (Preliminary result)   Collection Time: 04/17/17 10:52 PM  Result Value Ref Range Status   Specimen Description URINE, CLEAN CATCH  Final   Special Requests NONE  Final   Culture (A)  Final    >=100,000 COLONIES/mL ESCHERICHIA COLI SUSCEPTIBILITIES TO FOLLOW Performed at [facility], [address], [place], [place] [address]    Report Status PENDING  Incomplete     Studies: No results found.  Scheduled Meds: . aspirin EC  81 mg Oral Daily  . enoxaparin (LOVENOX) injection  40 mg Subcutaneous Daily  . latanoprost  1 drop Both Eyes QHS  . levalbuterol  0.63 mg Nebulization TID  . mouth rinse  15 mL Mouth Rinse BID  . Naltrexone-Bupropion HCl ER  1 tablet Oral Daily  . pantoprazole  40 mg Oral Daily  . timolol  1 drop Both Eyes QPC breakfast  . traZODone  200 mg Oral QHS    Continuous Infusions: . cefTRIAXone (ROCEPHIN)  IV Stopped (04/19/17 0117)     Time spent: 16mins  Shaily Librizzi MD, PhD  [facility] Pager [phone]. If 7PM-7AM, please contact night-coverage at [email or web address], password [facility] 04/19/2017, 7:32 PM  LOS: 2 days

## 2017-04-19 NOTE — Progress Notes (Signed)
Physical Therapy Treatment Patient Details Name: TRYSTEN BERTI MRN: [id number] DOB: 1930/03/04 Today's Date: 04/19/2017    History of Present Illness 83 yo female admitted with sepsis, fall, UTI. Hx of fibromyalgia, OA, ulcerative colitis, chronic pain.    PT Comments    Big improvement compared to yesterday's session. Pt walked ~115 feet with a RW. O2 sats remained >90% on RA during ambulation. She tolerated ambulation distance well. Will continue to follow.    Follow Up Recommendations  SNF (depending on continued progress)     Equipment Recommendations  None recommended by PT    Recommendations for Other Services       Precautions / Restrictions Precautions Precautions: Fall Restrictions Weight Bearing Restrictions: No    Mobility  Bed Mobility Overal bed mobility: Needs Assistance Bed Mobility: Supine to Sit     Supine to sit: HOB elevated;Mod assist     General bed mobility comments: Assist for trunk. Pt relied on bedrail.   Transfers Overall transfer level: Needs assistance Equipment used: Rolling walker (2 wheeled) Transfers: Sit to/from Stand Sit to Stand: Min assist;From elevated surface         General transfer comment: Assist to rise, stabilize, control descent. VCS safety, technique, hand placement.   Ambulation/Gait Ambulation/Gait assistance: Min assist Ambulation Distance (Feet): 115 Feet Assistive device: Rolling walker (2 wheeled) Gait Pattern/deviations: Step-to pattern;Step-through pattern;Decreased stride length     General Gait Details: Assist to stabilize pt and maneuver with RW. Dyspnea 2/4. 1 standing break needed/taken after ~50 feet. O2 sat >90%   Stairs            Wheelchair Mobility    Modified Rankin (Stroke Patients Only)       Balance Overall balance assessment: Needs assistance           Standing balance-Leahy Scale: Poor                              Cognition Arousal/Alertness:  Awake/alert Behavior During Therapy: WFL for tasks assessed/performed Overall Cognitive Status: Within Functional Limits for tasks assessed                                        Exercises      General Comments        Pertinent Vitals/Pain Pain Assessment: No/denies pain    Home Living                      Prior Function            PT Goals (current goals can now be found in the care plan section) Progress towards PT goals: Progressing toward goals    Frequency    Min 3X/week      PT Plan Current plan remains appropriate    Co-evaluation              AM-PAC PT "6 Clicks" Daily Activity  Outcome Measure  Difficulty turning over in bed (including adjusting bedclothes, sheets and blankets)?: A Lot Difficulty moving from lying on back to sitting on the side of the bed? : A Little Difficulty sitting down on and standing up from a chair with arms (e.g., wheelchair, bedside commode, etc,.)?: A Little Help needed moving to and from a bed to chair (including a wheelchair)?: A Little Help needed walking in hospital room?:  A Little Help needed climbing 3-5 steps with a railing? : A Lot 6 Click Score: 16    End of Session Equipment Utilized During Treatment: Gait belt Activity Tolerance: Patient tolerated treatment well Patient left: in chair;with call bell/phone within reach;with family/visitor present;with chair alarm set   PT Visit Diagnosis: Muscle weakness (generalized) (M62.81);Difficulty in walking, not elsewhere classified (R26.2)     Time: 7615-1834 PT Time Calculation (min) (ACUTE ONLY): 15 min  Charges:  $Gait Training: 8-22 mins                    G Codes:          Weston Anna, MPT Pager: [phone]

## 2017-04-19 NOTE — NC FL2 (Signed)
[place] LEVEL OF CARE SCREENING TOOL     IDENTIFICATION  Patient Name: [name] Birthdate: August 25, 1930 Sex: female Admission Date (Current Location): 04/17/2017  [facility] and [place] Number:  Herbalist and Address:  [facility],  [place] [address] [place]      Provider Number: [id number]  Attending Physician Name and Address:  Florencia Reasons, MD  Relative Name and Phone Number:       Current Level of Care: Hospital Recommended Level of Care: [place] Prior Approval Number:    Date Approved/Denied:   PASRR Number: [id number]  Discharge Plan: SNF    Current Diagnoses: Patient Active Problem List   Diagnosis Date Noted  . Non-traumatic rhabdomyolysis 04/18/2017  . Lower urinary tract infectious disease 04/18/2017  . Hypokalemia   . Sepsis ([place]) 04/17/2017  . Insomnia, unspecified 08/12/2014  . OA (osteoarthritis) of hip 08/06/2014  . Chest pain 04/05/2012  . Dyspnea 04/05/2012  . Intraductal papilloma of breast - left breast 09/13/2011  . HYPERLIPIDEMIA 02/14/2011  . DEPRESSION 02/14/2011  . PERIPHERAL NEUROPATHY 02/14/2011  . Essential hypertension 02/14/2011  . GERD 02/14/2011  . OSTEOARTHRITIS 02/14/2011  . URINARY INCONTINENCE, STRESS 02/14/2011  . SKIN CANCER, HX OF 02/14/2011    Orientation RESPIRATION BLADDER Height & Weight     Self, Time, Situation, Place  Normal Continent Weight: 220 lb 3.8 oz (99.9 kg) Height:  5\' 10"  (177.8 cm)  BEHAVIORAL SYMPTOMS/MOOD NEUROLOGICAL BOWEL NUTRITION STATUS      Continent Diet (Heart Healthy )  AMBULATORY STATUS COMMUNICATION OF NEEDS Skin   Extensive Assist Verbally Normal                       Personal Care Assistance Level of Assistance  Bathing, Feeding, Dressing Bathing Assistance: Limited assistance Feeding assistance: Independent Dressing Assistance: Limited assistance     Functional Limitations Info  Sight, Hearing, Speech Sight  Info: Adequate Hearing Info: Adequate Speech Info: Adequate    SPECIAL CARE FACTORS FREQUENCY  PT (By licensed PT)     PT Frequency: Min 3X/week              Contractures Contractures Info: Not present    Additional Factors Info  Code Status, Allergies Code Status Info: Fullcode Allergies Info: Procaine, Epinephrine           Current Medications (04/19/2017):  This is the current hospital active medication list Current Facility-Administered Medications  Medication Dose Route Frequency Provider Last Rate Last Dose  . acetaminophen (TYLENOL) tablet 650 mg  650 mg Oral Q6H PRN Rise Patience, MD   650 mg at 04/18/17 0559   Or  . acetaminophen (TYLENOL) suppository 650 mg  650 mg Rectal Q6H PRN Rise Patience, MD      . aspirin EC tablet 81 mg  81 mg Oral Daily Rise Patience, MD   81 mg at 04/19/17 0900  . cefTRIAXone (ROCEPHIN) 2 g in dextrose 5 % 50 mL IVPB  2 g Intravenous Q24H Dorrene German, [facility]   Stopped at 04/19/17 0117  . enoxaparin (LOVENOX) injection 40 mg  40 mg Subcutaneous Daily Raiford Noble Latif, DO   40 mg at 04/19/17 0900  . hydrALAZINE (APRESOLINE) injection 10 mg  10 mg Intravenous Q4H PRN Rise Patience, MD      . ipratropium (ATROVENT) nebulizer solution 0.5 mg  0.5 mg Nebulization Q6H PRN Raiford Noble Latif, DO      .  latanoprost (XALATAN) 0.005 % ophthalmic solution 1 drop  1 drop Both Eyes QHS Rise Patience, MD   1 drop at 04/18/17 2217  . levalbuterol (XOPENEX) nebulizer solution 0.63 mg  0.63 mg Nebulization TID Raiford Noble Latif, DO   0.63 mg at 04/19/17 1033  . magnesium sulfate IVPB 2 g 50 mL  2 g Intravenous Once Florencia Reasons, MD      . MEDLINE mouth rinse  15 mL Mouth Rinse BID Rise Patience, MD   15 mL at 04/19/17 0900  . Naltrexone-Bupropion HCl ER 8-90 MG TB12 1 tablet  1 tablet Oral Daily Rise Patience, MD      . pantoprazole (PROTONIX) EC tablet 40 mg  40 mg Oral Daily Rise Patience, MD    40 mg at 04/19/17 0900  . timolol (TIMOPTIC) 0.5 % ophthalmic solution 1 drop  1 drop Both Eyes QPC breakfast Rise Patience, MD   1 drop at 04/19/17 0858  . traZODone (DESYREL) tablet 200 mg  200 mg Oral QHS Rise Patience, MD   200 mg at 04/18/17 2217     Discharge Medications: Please see discharge summary for a list of discharge medications.  Relevant Imaging Results:  Relevant Lab Results:   Additional Information [id number]  Lia Hopping, LCSW

## 2017-04-20 LAB — CULTURE, BLOOD (ROUTINE X 2)
SPECIAL REQUESTS: ADEQUATE
Special Requests: ADEQUATE

## 2017-04-20 LAB — URINE CULTURE: Culture: >=100000 — AB

## 2017-04-20 LAB — CBC WITH DIFFERENTIAL/PLATELET
BASOS PCT: 0 %
Basophils Absolute: 0 10*3/uL (ref 0.0–0.1)
EOS ABS: 0 10*3/uL (ref 0.0–0.7)
Eosinophils Relative: 1 %
HCT: 31.5 % — ABNORMAL LOW (ref 36.0–46.0)
Hemoglobin: 10.2 g/dL — ABNORMAL LOW (ref 12.0–15.0)
LYMPHS ABS: 0.7 10*3/uL (ref 0.7–4.0)
Lymphocytes Relative: 18 %
MCH: 28.6 pg (ref 26.0–34.0)
MCHC: 32.4 g/dL (ref 30.0–36.0)
MCV: 88.2 fL (ref 78.0–100.0)
MONO ABS: 0.4 10*3/uL (ref 0.1–1.0)
MONOS PCT: 8 %
Neutro Abs: 3 10*3/uL (ref 1.7–7.7)
Neutrophils Relative %: 73 %
Platelets: 112 10*3/uL — ABNORMAL LOW (ref 150–400)
RBC: 3.57 MIL/uL — ABNORMAL LOW (ref 3.87–5.11)
RDW: 15.3 % (ref 11.5–15.5)
WBC: 4.2 10*3/uL (ref 4.0–10.5)

## 2017-04-20 LAB — COMPREHENSIVE METABOLIC PANEL
ALK PHOS: 53 U/L (ref 38–126)
ALT: 30 U/L (ref 14–54)
ANION GAP: 7 (ref 5–15)
AST: 46 U/L — ABNORMAL HIGH (ref 15–41)
Albumin: 2.6 g/dL — ABNORMAL LOW (ref 3.5–5.0)
BILIRUBIN TOTAL: 0.4 mg/dL (ref 0.3–1.2)
BUN: 14 mg/dL (ref 6–20)
CO2: 24 mmol/L (ref 22–32)
CREATININE: 0.54 mg/dL (ref 0.44–1.00)
Calcium: 8.4 mg/dL — ABNORMAL LOW (ref 8.9–10.3)
Chloride: 104 mmol/L (ref 101–111)
GFR calc Af Amer: >60 mL/min (ref >60)
Glucose, Bld: 125 mg/dL — ABNORMAL HIGH (ref 65–99)
POTASSIUM: 3.3 mmol/L — AB (ref 3.5–5.1)
Sodium: 135 mmol/L (ref 135–145)
Total Protein: 5.5 g/dL — ABNORMAL LOW (ref 6.5–8.1)

## 2017-04-20 LAB — CK: Total CK: 255 U/L — ABNORMAL HIGH (ref 38–234)

## 2017-04-20 LAB — PHOSPHORUS: Phosphorus: 1.5 mg/dL — ABNORMAL LOW (ref 2.5–4.6)

## 2017-04-20 MED ORDER — POTASSIUM & SODIUM PHOSPHATES 280-160-250 MG PO PACK
1.0000 | PACK | Freq: Three times a day (TID) | ORAL | Status: DC
  Administered 2017-04-20: 1 via ORAL
  Filled 2017-04-20 (×2): qty 1

## 2017-04-20 MED ORDER — POTASSIUM CHLORIDE CRYS ER 20 MEQ PO TBCR
40.0000 meq | EXTENDED_RELEASE_TABLET | Freq: Once | ORAL | Status: AC
  Administered 2017-04-20: 40 meq via ORAL
  Filled 2017-04-20: qty 2

## 2017-04-20 MED ORDER — CEFUROXIME AXETIL 500 MG PO TABS: 500.0000 mg | ORAL_TABLET | Freq: Two times a day (BID) | ORAL | 0 refills | Status: AC

## 2017-04-20 MED ORDER — FLORANEX PO PACK: 1.0000 g | PACK | Freq: Three times a day (TID) | ORAL | 0 refills | Status: AC

## 2017-04-20 MED ORDER — FLUCONAZOLE 100 MG PO TABS
150.0000 mg | ORAL_TABLET | Freq: Once | ORAL | Status: AC
  Administered 2017-04-20: 150 mg via ORAL
  Filled 2017-04-20: qty 2

## 2017-04-20 MED ORDER — SIMVASTATIN 20 MG PO TABS: 20.0000 mg | ORAL_TABLET | Freq: Every day | ORAL | 0 refills | Status: DC

## 2017-04-20 MED ORDER — CEFUROXIME AXETIL 500 MG PO TABS
500.0000 mg | ORAL_TABLET | Freq: Two times a day (BID) | ORAL | Status: DC
  Filled 2017-04-20: qty 1

## 2017-04-20 NOTE — Discharge Summary (Signed)
Discharge Summary  [name] AOZ:[id number] DOB: October 23, 1930  PCP: Kristie Cowman, MD  Admit date: 04/17/2017 Discharge date: 04/20/2017  Time spent: >72mins  Recommendations for Outpatient Follow-up:  1. F/u with SNF MD for hospital discharge follow up, repeat cbc/cmp /ck    Discharge Diagnoses:  Active Hospital Problems   Diagnosis Date Noted  . Sepsis ([facility]) 04/17/2017  . Non-traumatic rhabdomyolysis 04/18/2017  . Lower urinary tract infectious disease 04/18/2017  . Hypokalemia   . Essential hypertension 02/14/2011    Resolved Hospital Problems   Diagnosis Date Noted Date Resolved  No resolved problems to display.    Discharge Condition: stable  Diet recommendation: heart healthy  Filed Weights   04/18/17 0101 04/18/17 0555 04/19/17 0508  Weight: 96.5 kg (212 lb 11.9 oz) 96.4 kg (212 lb 8.4 oz) 99.9 kg (220 lb 3.8 oz)    History of present illness:  PCP: Kristie Cowman, MD   Patient coming from: Home.  Chief Complaint: Weakness.  HPI: [name] is a 83 y.o. female with history of hypertension, hyperlipidemia was brought to the ER after patient was finding it difficult to ambulate after a fall. Patient states she was treated for pneumonia 3 weeks ago. 2 days ago patient started developing dysuria with sensation of fever and chills. Yesterday when patient was trying to stand up from sitting position patient lost balance and fell backwards. In the process patient did hit her head but did not lose consciousness. She was not able to stand up for at least 8 hours when patients son's phone or on the floor. Since patient was very weak patient was brought to the ER.   ED Course: In the ER patient was found to be febrile temperature of around 104F, with blood pressure in the low normal range, tachycardic and leukocytosis with chest x-ray does not show anything acute and urine analysis compatible with UTI. Labs also show hypokalemia and rhabdomyolysis. EKG was  showing normal sinus rhythm. Patient was given fluid bolus started on empiric antibiotics for UTI with fluids for possible sepsis. Blood cultures were obtained.  Hospital Course:  Principal Problem:   Sepsis [facility]) Active Problems:   Essential hypertension   Non-traumatic rhabdomyolysis   Lower urinary tract infectious disease   Hypokalemia   Sepsis with ecoli bacteremia and ecoli uti She presented with fever 104, sinus tachycardia heart rate 101, RR 22-30, leukocytosis, wbc 13,  Initial blood culture and urine culture + ecoli sensitive to cephalosporin, resistant to ampicillin and bactrim  home bp meds held in the hospital, she received hydration and iv rocephin 2g, repeat blood culture no growth, fever, leukocytosis resolved. she is discharged on oral ceftin 500mg  bid for 12 days to finish total of 14days treatment  Patient is given diflucan 150mg  x1 due to c/o yeast infection due to abx use.   Rhabdomyolysis from fall at home She received hydration, ck trending down  ck on presentation was 2234, ck is 255 at discharge. Statin held, consider repeat ck and resume stain in a week if ck return to normal.  Mild thrombocytopenia: likely from sepsis, platelet nadir at 109, plt 112 at discharge.  Elevation of lft, likely from sepsis and rhabdo Improving, repeat cmp at hospital follow up, statin held for now  Hypokalemia/hypomagnesemia; replace k/mag  AKI; likely from uti/sepsis/rhabdo Bun 31/cr 1.2 on admission, bun 14/cr 0.5 at discharge   HTN: home bp meds held in the hospital due to sepsis, resumed at discharge.  FTT: PT, SNF  Code Status: full  Family Communication: patient   Disposition Plan: SNF   Consultants:  none  Procedures:  none  Antibiotics: rocephin   Discharge Exam: BP 125/72 (BP Location: Right Arm)   Pulse 74   Temp 97.9 F (36.6 C) (Oral)   Resp 18   Ht 5\' 10"  (1.778 m)   Wt 99.9 kg (220 lb 3.8 oz)   SpO2 96%   BMI 31.60  kg/m   General: NAD Cardiovascular: RRR Respiratory: CTABL  Discharge Instructions You were cared for by a hospitalist during your hospital stay. If you have any questions about your discharge medications or the care you received while you were in the hospital after you are discharged, you can call the unit and asked to speak with the hospitalist on call if the hospitalist that took care of you is not available. Once you are discharged, your primary care physician will handle any further medical issues. Please note that NO REFILLS for any discharge medications will be authorized once you are discharged, as it is imperative that you return to your primary care physician (or establish a relationship with a primary care physician if you do not have one) for your aftercare needs so that they can reassess your need for medications and monitor your lab values.  Discharge Instructions    Diet - low sodium heart healthy    Complete by:  As directed    Increase activity slowly    Complete by:  As directed      Allergies as of 04/20/2017      Reactions   Procaine    Novocaine caused rash and face blisters.   Epinephrine    Rapid heart beat      Medication List    TAKE these medications   aspirin EC 81 MG tablet Take 81 mg by mouth daily.   cefUROXime 500 MG tablet Commonly known as:  CEFTIN Take 1 tablet (500 mg total) by mouth 2 (two) times daily with a meal.   CONTRAVE 8-90 MG Tb12 Generic drug:  Naltrexone-Bupropion HCl ER Take 1 tablet by mouth daily.   estradiol 0.05 mg/24hr patch Commonly known as:  CLIMARA - Dosed in mg/24 hr Place 1 patch onto the skin once a week. Changes every thursday   guaiFENesin-codeine 100-10 MG/5ML syrup Take 10 mLs by mouth every 4 (four) hours.   lactobacillus Pack Take 1 packet (1 g total) by mouth 3 (three) times daily with meals.   lisinopril-hydrochlorothiazide 20-25 MG tablet Commonly known as:  PRINZIDE,ZESTORETIC Take 1 tablet by mouth  every morning.   LUMIGAN 0.01 % Soln Generic drug:  bimatoprost Place 1 drop into both eyes at bedtime.   methocarbamol 500 MG tablet Commonly known as:  ROBAXIN Take 1 tablet (500 mg total) by mouth every 6 (six) hours as needed for muscle spasms.   MONISTAT 3 TRIPLE ACTION VA Place 1 suppository vaginally daily.   omeprazole 40 MG capsule Commonly known as:  PRILOSEC Take 40 mg by mouth daily.   SENIOR VITES PO Take 1 tablet by mouth daily.   simvastatin 20 MG tablet Commonly known as:  ZOCOR Take 1 tablet (20 mg total) by mouth at bedtime. Resume in a week from now Start taking on:  04/27/2017 What changed:  additional instructions  These instructions start on 04/27/2017. If you are unsure what to do until then, ask your doctor or other care provider.   STOOL SOFTENER PO Take 100 mg by mouth 2 (two) times  daily.   timolol 0.5 % ophthalmic solution Commonly known as:  TIMOPTIC Place 1 drop into both eyes daily after breakfast.   traZODone 100 MG tablet Commonly known as:  DESYREL Take 200 mg by mouth at bedtime.      Allergies  Allergen Reactions  . Procaine     Novocaine caused rash and face blisters.  . Epinephrine     Rapid heart beat   Contact information for after-discharge care    Destination    [facility] .   Specialty:  [place] information: [address] [place] [phone]               The results of significant diagnostics from this hospitalization (including imaging, microbiology, ancillary and laboratory) are listed below for reference.    Significant Diagnostic Studies: Dg Chest 2 View  Result Date: 04/17/2017 CLINICAL DATA:  PT RECEIVED FROM HOME VIA EMS FOR A FALL. PER EMS, PT HAD FALLEN EARLIER IN THE DAY AND WAS FOUND ON THE FLOOR BY HER FAMILY WHEN THEY GOT HOME FROM WORK. PT STS SHE HAD BEEN ON THE FLOOR FOR APPROXIMATELY 8 HOURS. EXAM: CHEST  2  VIEW COMPARISON:  07/31/2015 FINDINGS: Heart size is normal. Lungs are free of focal consolidations and pleural effusions. No pulmonary edema. Overlying the left lung apex, there is a nodular opacity, possibly representing artifact from EKG lead and normal structures. However, a pulmonary nodule is not excluded. No acute fractures or pneumothorax. IMPRESSION: 1. Question of left upper lobe pulmonary nodule. Further evaluation is indicated. Consider repeat PA view of the chest without EKG leads if the patient is able. Otherwise, consider CT of the chest. 2. No acute cardiopulmonary disease. Electronically Signed   By: Nolon Nations M.D.   On: 04/17/2017 20:43   Ct Head Wo Contrast  Result Date: 04/17/2017 CLINICAL DATA:  Patient fell earlier in the day.  Weakness x1 week. EXAM: CT HEAD WITHOUT CONTRAST CT CERVICAL SPINE WITHOUT CONTRAST TECHNIQUE: Multidetector CT imaging of the head and cervical spine was performed following the standard protocol without intravenous contrast. Multiplanar CT image reconstructions of the cervical spine were also generated. COMPARISON:  Cervical spine MRI 05/26/2009 FINDINGS: CT HEAD FINDINGS BRAIN: There is mild sulcal prominence consistent with superficial atrophy. No intraparenchymal hemorrhage, mass effect nor midline shift. Periventricular and subcortical white matter hypodensities consistent with chronic small vessel ischemic disease are identified. No acute large vascular territory infarcts. No abnormal extra-axial fluid collections. Basal cisterns are not effaced and midline. VASCULAR: Moderate calcific atherosclerosis of the carotid siphons. SKULL: No skull fracture. No significant scalp soft tissue swelling. SINUSES/ORBITS: The mastoid air-cells are clear. The included paranasal sinuses are well-aerated.The included ocular globes and orbital contents are non-suspicious. OTHER: None. CT CERVICAL SPINE FINDINGS Alignment: Stone reversal cervical lordosis similar to prior  exam with apex at C5. Skull base and vertebrae: No acute fracture. No primary bone lesion or focal pathologic process. Soft tissues and spinal canal: No prevertebral fluid or swelling. No visible canal hematoma. Disc levels: Mild disc space narrowing C3-4 with moderate to marked disc space narrowing noted at C4-5, C5-6 and C6-7. Small posterior marginal osteophytes contribute to C5-6 and C6-7 mild bilateral neural foraminal encroachment. Bilateral multilevel facet arthropathy with joint space narrowing and facet hypertrophy is seen. Uncovertebral joint osteoarthritis is identified on the left at C3-4, bilaterally at C4-5, C5-6 and C6-7. Upper chest: Nonacute Other: None IMPRESSION: 1. No acute intracranial abnormality.  Mild  superficial atrophy. 2. Cervical spondylosis without acute posttraumatic fracture or subluxations. Electronically Signed   By: Ashley Royalty M.D.   On: 04/17/2017 21:37   Ct Cervical Spine Wo Contrast  Result Date: 04/17/2017 CLINICAL DATA:  Patient fell earlier in the day.  Weakness x1 week. EXAM: CT HEAD WITHOUT CONTRAST CT CERVICAL SPINE WITHOUT CONTRAST TECHNIQUE: Multidetector CT imaging of the head and cervical spine was performed following the standard protocol without intravenous contrast. Multiplanar CT image reconstructions of the cervical spine were also generated. COMPARISON:  Cervical spine MRI 05/26/2009 FINDINGS: CT HEAD FINDINGS BRAIN: There is mild sulcal prominence consistent with superficial atrophy. No intraparenchymal hemorrhage, mass effect nor midline shift. Periventricular and subcortical white matter hypodensities consistent with chronic small vessel ischemic disease are identified. No acute large vascular territory infarcts. No abnormal extra-axial fluid collections. Basal cisterns are not effaced and midline. VASCULAR: Moderate calcific atherosclerosis of the carotid siphons. SKULL: No skull fracture. No significant scalp soft tissue swelling. SINUSES/ORBITS: The  mastoid air-cells are clear. The included paranasal sinuses are well-aerated.The included ocular globes and orbital contents are non-suspicious. OTHER: None. CT CERVICAL SPINE FINDINGS Alignment: Stone reversal cervical lordosis similar to prior exam with apex at C5. Skull base and vertebrae: No acute fracture. No primary bone lesion or focal pathologic process. Soft tissues and spinal canal: No prevertebral fluid or swelling. No visible canal hematoma. Disc levels: Mild disc space narrowing C3-4 with moderate to marked disc space narrowing noted at C4-5, C5-6 and C6-7. Small posterior marginal osteophytes contribute to C5-6 and C6-7 mild bilateral neural foraminal encroachment. Bilateral multilevel facet arthropathy with joint space narrowing and facet hypertrophy is seen. Uncovertebral joint osteoarthritis is identified on the left at C3-4, bilaterally at C4-5, C5-6 and C6-7. Upper chest: Nonacute Other: None IMPRESSION: 1. No acute intracranial abnormality.  Mild superficial atrophy. 2. Cervical spondylosis without acute posttraumatic fracture or subluxations. Electronically Signed   By: Ashley Royalty M.D.   On: 04/17/2017 21:37   Dg Chest Port 1 View  Result Date: 04/18/2017 CLINICAL DATA:  Fever and shortness of breath EXAM: PORTABLE CHEST 1 VIEW COMPARISON:  Apr 17, 2017 FINDINGS: There is no edema or consolidation. Heart is upper normal in size with pulmonary vascularity within normal limits. No adenopathy. There is aortic atherosclerosis. There is degenerative change in each shoulder, stable. There is evidence of old trauma involving the lateral right clavicle, stable. IMPRESSION: Aortic atherosclerosis.  No edema or consolidation. Electronically Signed   By: Lowella Grip III M.D.   On: 04/18/2017 14:25    Microbiology: Recent Results (from the past 240 hour(s))  Culture, blood (Routine x 2)     Status: Abnormal   Collection Time: 04/17/17  8:11 PM  Result Value Ref Range Status   Specimen  Description BLOOD BLOOD LEFT FOREARM  Final   Special Requests   Final    BOTTLES DRAWN AEROBIC AND ANAEROBIC Blood Culture adequate volume   Culture  Setup Time   Final    GRAM NEGATIVE RODS IN BOTH AEROBIC AND ANAEROBIC BOTTLES CRITICAL RESULT CALLED TO, READ BACK BY AND VERIFIED WITH: A [facility] PHARMD 2003 04/18/17 A BROWNING Performed at [facility], [place] [address], [place], [place] [address]    Culture ESCHERICHIA COLI (A)  Final   Report Status 04/20/2017 FINAL  Final   Organism ID, Bacteria ESCHERICHIA COLI  Final      Susceptibility   Escherichia coli - MIC*    AMPICILLIN >=32 RESISTANT Resistant  CEFAZOLIN <=4 SENSITIVE Sensitive     CEFEPIME <=1 SENSITIVE Sensitive     CEFTAZIDIME <=1 SENSITIVE Sensitive     CEFTRIAXONE <=1 SENSITIVE Sensitive     CIPROFLOXACIN <=0.25 SENSITIVE Sensitive     GENTAMICIN <=1 SENSITIVE Sensitive     IMIPENEM <=0.25 SENSITIVE Sensitive     TRIMETH/SULFA >=320 RESISTANT Resistant     AMPICILLIN/SULBACTAM 16 INTERMEDIATE Intermediate     PIP/TAZO <=4 SENSITIVE Sensitive     Extended ESBL NEGATIVE Sensitive     * ESCHERICHIA COLI  Blood Culture ID Panel (Reflexed)     Status: Abnormal   Collection Time: 04/17/17  8:11 PM  Result Value Ref Range Status   Enterococcus species NOT DETECTED NOT DETECTED Final   Listeria monocytogenes NOT DETECTED NOT DETECTED Final   Staphylococcus species NOT DETECTED NOT DETECTED Final   Staphylococcus aureus NOT DETECTED NOT DETECTED Final   Streptococcus species NOT DETECTED NOT DETECTED Final   Streptococcus agalactiae NOT DETECTED NOT DETECTED Final   Streptococcus pneumoniae NOT DETECTED NOT DETECTED Final   Streptococcus pyogenes NOT DETECTED NOT DETECTED Final   Acinetobacter baumannii NOT DETECTED NOT DETECTED Final   Enterobacteriaceae species DETECTED (A) NOT DETECTED Final    Comment: Enterobacteriaceae represent a large family of gram-negative bacteria, not a single organism. CRITICAL  RESULT CALLED TO, READ BACK BY AND VERIFIED WITH: A PHAM PHAMD 2003 04/18/17 A BROWNING    Enterobacter cloacae complex NOT DETECTED NOT DETECTED Final   Escherichia coli DETECTED (A) NOT DETECTED Final    Comment: CRITICAL RESULT CALLED TO, READ BACK BY AND VERIFIED WITH: A PHAM PHARMD 2003 04/18/17 A BROWNING    Klebsiella oxytoca NOT DETECTED NOT DETECTED Final   Klebsiella pneumoniae NOT DETECTED NOT DETECTED Final   Proteus species NOT DETECTED NOT DETECTED Final   Serratia marcescens NOT DETECTED NOT DETECTED Final   Carbapenem resistance NOT DETECTED NOT DETECTED Final   Haemophilus influenzae NOT DETECTED NOT DETECTED Final   Neisseria meningitidis NOT DETECTED NOT DETECTED Final   Pseudomonas aeruginosa NOT DETECTED NOT DETECTED Final   Candida albicans NOT DETECTED NOT DETECTED Final   Candida glabrata NOT DETECTED NOT DETECTED Final   Candida krusei NOT DETECTED NOT DETECTED Final   Candida parapsilosis NOT DETECTED NOT DETECTED Final   Candida tropicalis NOT DETECTED NOT DETECTED Final    Comment: Performed at [facility], [place] [address] [place], [place] [address]  Culture, blood (Routine x 2)     Status: Abnormal   Collection Time: 04/17/17  8:14 PM  Result Value Ref Range Status   Specimen Description BLOOD LEFT HAND  Final   Special Requests   Final    BOTTLES DRAWN AEROBIC AND ANAEROBIC Blood Culture adequate volume   Culture  Setup Time   Final    GRAM NEGATIVE RODS CRITICAL VALUE NOTED.  VALUE IS CONSISTENT WITH PREVIOUSLY REPORTED AND CALLED VALUE. IN BOTH AEROBIC AND ANAEROBIC BOTTLES    Culture (A)  Final    ESCHERICHIA COLI SUSCEPTIBILITIES PERFORMED ON PREVIOUS CULTURE WITHIN THE LAST 5 DAYS. Performed at [facility], [place] [address], [place], [place] [address]    Report Status 04/20/2017 FINAL  Final  Urine culture     Status: Abnormal   Collection Time: 04/17/17 10:52 PM  Result Value Ref Range Status   Specimen Description URINE, CLEAN  CATCH  Final   Special Requests NONE  Final   Culture >=100,000 COLONIES/mL ESCHERICHIA COLI (A)  Final  Report Status 04/20/2017 FINAL  Final   Organism ID, Bacteria ESCHERICHIA COLI (A)  Final      Susceptibility   Escherichia coli - MIC*    AMPICILLIN >=32 RESISTANT Resistant     CEFAZOLIN <=4 SENSITIVE Sensitive     CEFTRIAXONE <=1 SENSITIVE Sensitive     CIPROFLOXACIN <=0.25 SENSITIVE Sensitive     GENTAMICIN <=1 SENSITIVE Sensitive     IMIPENEM <=0.25 SENSITIVE Sensitive     NITROFURANTOIN <=16 SENSITIVE Sensitive     TRIMETH/SULFA >=320 RESISTANT Resistant     AMPICILLIN/SULBACTAM 16 INTERMEDIATE Intermediate     PIP/TAZO <=4 SENSITIVE Sensitive     Extended ESBL NEGATIVE Sensitive     * >=100,000 COLONIES/mL ESCHERICHIA COLI  Culture, blood (routine x 2)     Status: None (Preliminary result)   Collection Time: 04/19/17 10:39 AM  Result Value Ref Range Status   Specimen Description BLOOD RIGHT ANTECUBITAL  Final   Special Requests   Final    BOTTLES DRAWN AEROBIC ONLY Blood Culture adequate volume   Culture   Final    NO GROWTH < 24 HOURS Performed at [facility], [address], [place], [place] [address]    Report Status PENDING  Incomplete  Culture, blood (routine x 2)     Status: None (Preliminary result)   Collection Time: 04/19/17 10:44 AM  Result Value Ref Range Status   Specimen Description BLOOD LEFT HAND  Final   Special Requests   Final    BOTTLES DRAWN AEROBIC ONLY Blood Culture adequate volume   Culture   Final    NO GROWTH < 24 HOURS Performed at [facility], [place] [address], [place], [place] [address]    Report Status PENDING  Incomplete     Labs: Basic Metabolic Panel:  Recent Labs Lab 04/17/17 2011 04/18/17 0555 04/19/17 0554 04/20/17 0928  NA 134* 135 136 135  K 2.7* 3.9 4.0 3.3*  CL 99* 107 108 104  CO2 23 19* 22 24  GLUCOSE 132* 115* 103* 125*  BUN 31* 28* 21* 14  CREATININE 1.21* 0.91 0.70 0.54  CALCIUM 8.7* 8.0*  8.3* 8.4*  MG  --  1.5* 1.9  --   PHOS  --   --  1.5* 1.5*   Liver Function Tests:  Recent Labs Lab 04/17/17 2011 04/18/17 0555 04/19/17 0554 04/20/17 0928  AST 62* 65* 49* 46*  ALT 21 24 27 30   ALKPHOS 48 44 38 53  BILITOT 1.6* 1.0 0.5 0.4  PROT 6.4* 5.5* 5.3* 5.5*  ALBUMIN 3.4* 2.8* 2.6* 2.6*   No results for input(s): LIPASE, AMYLASE in the last 168 hours. No results for input(s): AMMONIA in the last 168 hours. CBC:  Recent Labs Lab 04/17/17 2011 04/18/17 0555 04/19/17 0554 04/20/17 0928  WBC 13.0* 9.2 4.8 4.2  NEUTROABS 11.3* 8.1* 4.0 3.0  HGB 11.9* 10.8* 9.8* 10.2*  HCT 34.8* 32.1* 29.8* 31.5*  MCV 86.8 87.5 88.2 88.2  PLT 162 130* 109* 112*   Cardiac Enzymes:  Recent Labs Lab 04/17/17 2011 04/18/17 0555 04/18/17 1341 04/18/17 1818 04/19/17 0027 04/19/17 0554 04/20/17 0928  CKTOTAL 2,234* 1,842* 1,398*  --   --  519* 255*  CKMB  --   --  11.8*  --   --  7.7*  --   TROPONINI  --  0.15* 0.08* 0.14* 0.06*  --   --    BNP: BNP (last 3 results) No results for input(s): BNP in the last  8760 hours.  ProBNP (last 3 results) No results for input(s): PROBNP in the last 8760 hours.  CBG: No results for input(s): GLUCAP in the last 168 hours.     SignedFlorencia Reasons MD, PhD  [facility] 04/20/2017, 12:48 PM

## 2017-04-20 NOTE — Progress Notes (Signed)
Confirmed with [facility] that pt can arrive for admission with granddaughter 4pm.  RN Report # [phone].  Spoke with pt on phone with granddaughter in room.   Plan: DC to [facility], family to transport.   Sharren Bridge, MSW, LCSW Clinical Social Work 04/20/2017 [phone]

## 2017-04-20 NOTE — Care Management Note (Signed)
Case Management Note  Patient Details  Name: [name] MRN: [id number] Date of Birth: [birth year]  Subjective/Objective:                    Action/Plan:d/c SNF   Expected Discharge Date:  04/20/17               Expected Discharge Plan:  Skilled Nursing Facility  In-House Referral:  Clinical Social Work  Discharge planning Services  CM Consult  Post Acute Care Choice:    Choice offered to:     DME Arranged:    DME Agency:     HH Arranged:    [place] Agency:     Status of Service:  Completed, signed off  If discussed at [employer], dates discussed:    Additional Comments:  Dessa Phi, RN 04/20/2017, 12:53 PM

## 2017-04-20 NOTE — Clinical Social Work Placement (Addendum)
Patient daughter in law agreeable to fillout paper at 4:00pm. Patient plans to tranpsort by Family car.  CLINICAL SOCIAL WORK PLACEMENT  NOTE  Date:  04/20/2017  Patient Details  Name: [name] MRN: [id number] Date of Birth: [birth year]  Clinical Social Work is seeking post-discharge placement for this patient at the [place] level of care (*CSW will initial, date and re-position this form in  chart as items are completed):  Yes   Patient/family provided with [facility] Work Department's list of facilities offering this level of care within the geographic area requested by the patient (or if unable, by the patient's family).  Yes   Patient/family informed of their freedom to choose among providers that offer the needed level of care, that participate in Medicare, Medicaid or managed care program needed by the patient, have an available bed and are willing to accept the patient.  Yes   Patient/family informed of [facility]'s ownership interest in [facility] and [facility], as well as of the fact that they are under no obligation to receive care at these facilities.  PASRR submitted to EDS on       PASRR number received on       Existing PASRR number confirmed on 04/19/17     FL2 transmitted to all facilities in geographic area requested by pt/family on       FL2 transmitted to all facilities within larger geographic area on       Patient informed that his/her managed care company has contracts with or will negotiate with certain facilities, including the following:  [facility]     Yes   Patient/family informed of bed offers received.  Patient chooses bed at [facility]     Physician recommends and patient chooses bed at      Patient to be transferred to [facility] on 04/20/17.  Patient to be transferred to facility by [facility]     Patient family notified on 04/20/17 of  transfer.  Name of family member notified:        PHYSICIAN       Additional Comment:    _______________________________________________ Lia Hopping, LCSW 04/20/2017, 11:28 AM

## 2017-04-24 LAB — CULTURE, BLOOD (ROUTINE X 2)
Culture: NO GROWTH
Culture: NO GROWTH
Special Requests: ADEQUATE
Special Requests: ADEQUATE

## 2018-08-21 DIAGNOSIS — M5030 Other cervical disc degeneration, unspecified cervical region: Secondary | ICD-10-CM | POA: Insufficient documentation

## 2020-09-12 ENCOUNTER — Other Ambulatory Visit (HOSPITAL_COMMUNITY): Payer: Self-pay | Admitting: Adult Health

## 2020-09-12 ENCOUNTER — Ambulatory Visit (HOSPITAL_COMMUNITY)
Admission: RE | Admit: 2020-09-12 | Discharge: 2020-09-12 | Disposition: A | Discharge status: Home / Self Care | Payer: Medicare Other | Source: Ambulatory Visit | Attending: Pulmonary Disease | Admitting: Pulmonary Disease

## 2020-09-12 DIAGNOSIS — U071 COVID-19: Secondary | ICD-10-CM | POA: Diagnosis present

## 2020-09-12 DIAGNOSIS — Z23 Encounter for immunization: Secondary | ICD-10-CM | POA: Insufficient documentation

## 2020-09-12 MED ORDER — METHYLPREDNISOLONE SODIUM SUCC 125 MG IJ SOLR: 125.0000 mg | Freq: Once | INTRAMUSCULAR | Status: DC | PRN

## 2020-09-12 MED ORDER — SODIUM CHLORIDE 0.9 % IV SOLN: Freq: Once | INTRAVENOUS | Status: AC

## 2020-09-12 MED ORDER — DIPHENHYDRAMINE HCL 50 MG/ML IJ SOLN: 50.0000 mg | Freq: Once | INTRAMUSCULAR | Status: DC | PRN

## 2020-09-12 MED ORDER — EPINEPHRINE 0.3 MG/0.3ML IJ SOAJ: 0.3000 mg | Freq: Once | INTRAMUSCULAR | Status: DC | PRN

## 2020-09-12 MED ORDER — SODIUM CHLORIDE 0.9 % IV SOLN: INTRAVENOUS | Status: DC | PRN

## 2020-09-12 MED ORDER — FAMOTIDINE IN NACL 20-0.9 MG/50ML-% IV SOLN: 20.0000 mg | Freq: Once | INTRAVENOUS | Status: DC | PRN

## 2020-09-12 MED ORDER — ALBUTEROL SULFATE HFA 108 (90 BASE) MCG/ACT IN AERS: 2.0000 | INHALATION_SPRAY | Freq: Once | RESPIRATORY_TRACT | Status: DC | PRN

## 2020-09-12 NOTE — Progress Notes (Addendum)
  Diagnosis: COVID-19  Physician: Asencion Noble, MD  Procedure: [facility] Med: bamlanivimab\etesevimab infusion - Provided patient with bamlanimivab\etesevimab fact sheet for patients, parents and caregivers prior to infusion.  Complications: No immediate complications noted.  Discharge: Discharged home   Janne Napoleon 09/12/2020

## 2020-09-12 NOTE — Discharge Instructions (Signed)

## 2020-09-12 NOTE — Progress Notes (Signed)
I connected by phone with [name] on 09/12/2020 at 10:57 AM to discuss the potential use of a new treatment for mild to moderate COVID-19 viral infection in non-hospitalized patients.  This patient is a 86 y.o. female that meets the FDA criteria for Emergency Use Authorization of COVID monoclonal antibody casirivimab/imdevimab or bamlanivimab/eteseviamb.  Has a (+) direct SARS-CoV-2 viral test result  Has mild or moderate COVID-19   Is NOT hospitalized due to COVID-19  Is within 10 days of symptom onset  Has at least one of the high risk factor(s) for progression to severe COVID-19 and/or hospitalization as defined in EUA.  Specific high risk criteria : Older age (>/= 84 yo), HTN, BMI greater than 25  Sx onset 10/2   I have spoken and communicated the following to the patient or parent/caregiver regarding COVID monoclonal antibody treatment:  1. FDA has authorized the emergency use for the treatment of mild to moderate COVID-19 in adults and pediatric patients with positive results of direct SARS-CoV-2 viral testing who are 15 years of age and older weighing at least 40 kg, and who are at high risk for progressing to severe COVID-19 and/or hospitalization.  2. The significant known and potential risks and benefits of COVID monoclonal antibody, and the extent to which such potential risks and benefits are unknown.  3. Information on available alternative treatments and the risks and benefits of those alternatives, including clinical trials.  4. Patients treated with COVID monoclonal antibody should continue to self-isolate and use infection control measures (e.g., wear mask, isolate, social distance, avoid sharing personal items, clean and disinfect "high touch" surfaces, and frequent handwashing) according to CDC guidelines.   5. The patient or parent/caregiver has the option to accept or refuse COVID monoclonal antibody treatment.  After reviewing this information with the  patient, the patient has agreed to receive one of the available covid 19 monoclonal antibodies and will be provided an appropriate fact sheet prior to infusion. Scot Dock, NP 09/12/2020 10:57 AM

## 2021-03-10 ENCOUNTER — Other Ambulatory Visit: Payer: Self-pay | Admitting: Orthopedic Surgery

## 2021-03-10 DIAGNOSIS — G959 Disease of spinal cord, unspecified: Secondary | ICD-10-CM

## 2021-03-10 DIAGNOSIS — M5412 Radiculopathy, cervical region: Secondary | ICD-10-CM

## 2021-03-15 ENCOUNTER — Ambulatory Visit: Payer: Medicare Other | Admitting: Podiatry

## 2021-03-31 ENCOUNTER — Ambulatory Visit
Admission: RE | Admit: 2021-03-31 | Discharge: 2021-03-31 | Disposition: A | Discharge status: Home / Self Care | Payer: Medicare Other | Source: Ambulatory Visit | Attending: Orthopedic Surgery | Admitting: Orthopedic Surgery

## 2021-03-31 DIAGNOSIS — M5412 Radiculopathy, cervical region: Secondary | ICD-10-CM

## 2021-03-31 DIAGNOSIS — G959 Disease of spinal cord, unspecified: Secondary | ICD-10-CM

## 2021-04-13 DIAGNOSIS — M5412 Radiculopathy, cervical region: Secondary | ICD-10-CM | POA: Insufficient documentation

## 2022-07-12 ENCOUNTER — Ambulatory Visit: Payer: Medicare Other | Attending: Sports Medicine | Admitting: Physical Therapy

## 2022-07-12 ENCOUNTER — Encounter: Payer: Self-pay | Admitting: Physical Therapy

## 2022-07-12 ENCOUNTER — Other Ambulatory Visit: Payer: Self-pay

## 2022-07-12 DIAGNOSIS — M6281 Muscle weakness (generalized): Secondary | ICD-10-CM | POA: Diagnosis present

## 2022-07-12 DIAGNOSIS — R262 Difficulty in walking, not elsewhere classified: Secondary | ICD-10-CM | POA: Insufficient documentation

## 2022-07-12 DIAGNOSIS — M25551 Pain in right hip: Secondary | ICD-10-CM | POA: Diagnosis not present
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
## 2022-07-14 ENCOUNTER — Ambulatory Visit: Payer: Medicare Other | Admitting: Physical Therapy

## 2022-07-14 DIAGNOSIS — R262 Difficulty in walking, not elsewhere classified: Secondary | ICD-10-CM

## 2022-07-14 DIAGNOSIS — M25551 Pain in right hip: Secondary | ICD-10-CM | POA: Diagnosis not present

## 2022-07-14 DIAGNOSIS — M6281 Muscle weakness (generalized): Secondary | ICD-10-CM
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
## 2022-07-20 ENCOUNTER — Ambulatory Visit: Payer: Medicare Other

## 2022-07-20 DIAGNOSIS — M6281 Muscle weakness (generalized): Secondary | ICD-10-CM

## 2022-07-20 DIAGNOSIS — R262 Difficulty in walking, not elsewhere classified: Secondary | ICD-10-CM

## 2022-07-20 DIAGNOSIS — M25551 Pain in right hip: Secondary | ICD-10-CM
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
## 2022-07-27 ENCOUNTER — Ambulatory Visit: Payer: Medicare Other | Admitting: Physical Therapy

## 2022-08-15 ENCOUNTER — Encounter (HOSPITAL_BASED_OUTPATIENT_CLINIC_OR_DEPARTMENT_OTHER): Payer: Self-pay

## 2022-08-15 ENCOUNTER — Emergency Department (HOSPITAL_BASED_OUTPATIENT_CLINIC_OR_DEPARTMENT_OTHER): Payer: Medicare Other

## 2022-08-15 ENCOUNTER — Inpatient Hospital Stay (HOSPITAL_BASED_OUTPATIENT_CLINIC_OR_DEPARTMENT_OTHER)
Admission: EM | Admit: 2022-08-15 | Discharge: 2022-08-17 | DRG: 641 | Disposition: A | Discharge status: Home / Self Care | Payer: Medicare Other | Attending: Internal Medicine | Admitting: Internal Medicine

## 2022-08-15 ENCOUNTER — Other Ambulatory Visit: Payer: Self-pay

## 2022-08-15 DIAGNOSIS — M797 Fibromyalgia: Secondary | ICD-10-CM | POA: Diagnosis present

## 2022-08-15 DIAGNOSIS — G629 Polyneuropathy, unspecified: Secondary | ICD-10-CM | POA: Diagnosis present

## 2022-08-15 DIAGNOSIS — M25532 Pain in left wrist: Secondary | ICD-10-CM | POA: Diagnosis present

## 2022-08-15 DIAGNOSIS — Z79899 Other long term (current) drug therapy: Secondary | ICD-10-CM

## 2022-08-15 DIAGNOSIS — I9589 Other hypotension: Secondary | ICD-10-CM | POA: Diagnosis present

## 2022-08-15 DIAGNOSIS — Z96643 Presence of artificial hip joint, bilateral: Secondary | ICD-10-CM | POA: Diagnosis present

## 2022-08-15 DIAGNOSIS — Z96653 Presence of artificial knee joint, bilateral: Secondary | ICD-10-CM | POA: Diagnosis present

## 2022-08-15 DIAGNOSIS — Z20822 Contact with and (suspected) exposure to covid-19: Secondary | ICD-10-CM | POA: Diagnosis present

## 2022-08-15 DIAGNOSIS — Z79891 Long term (current) use of opiate analgesic: Secondary | ICD-10-CM

## 2022-08-15 DIAGNOSIS — N179 Acute kidney failure, unspecified: Secondary | ICD-10-CM | POA: Diagnosis not present

## 2022-08-15 DIAGNOSIS — Z7982 Long term (current) use of aspirin: Secondary | ICD-10-CM

## 2022-08-15 DIAGNOSIS — I10 Essential (primary) hypertension: Secondary | ICD-10-CM | POA: Diagnosis not present

## 2022-08-15 DIAGNOSIS — E860 Dehydration: Secondary | ICD-10-CM | POA: Diagnosis not present

## 2022-08-15 DIAGNOSIS — Z888 Allergy status to other drugs, medicaments and biological substances status: Secondary | ICD-10-CM

## 2022-08-15 DIAGNOSIS — F32A Depression, unspecified: Secondary | ICD-10-CM | POA: Diagnosis present

## 2022-08-15 DIAGNOSIS — Z853 Personal history of malignant neoplasm of breast: Secondary | ICD-10-CM

## 2022-08-15 DIAGNOSIS — Z9049 Acquired absence of other specified parts of digestive tract: Secondary | ICD-10-CM

## 2022-08-15 DIAGNOSIS — E861 Hypovolemia: Secondary | ICD-10-CM | POA: Diagnosis not present

## 2022-08-15 DIAGNOSIS — M1990 Unspecified osteoarthritis, unspecified site: Secondary | ICD-10-CM | POA: Diagnosis present

## 2022-08-15 DIAGNOSIS — K219 Gastro-esophageal reflux disease without esophagitis: Secondary | ICD-10-CM | POA: Diagnosis present

## 2022-08-15 DIAGNOSIS — Z90710 Acquired absence of both cervix and uterus: Secondary | ICD-10-CM

## 2022-08-15 DIAGNOSIS — M5412 Radiculopathy, cervical region: Secondary | ICD-10-CM | POA: Diagnosis present

## 2022-08-15 DIAGNOSIS — E785 Hyperlipidemia, unspecified: Secondary | ICD-10-CM | POA: Diagnosis present

## 2022-08-15 DIAGNOSIS — Z96641 Presence of right artificial hip joint: Secondary | ICD-10-CM | POA: Diagnosis present

## 2022-08-15 DIAGNOSIS — Z85828 Personal history of other malignant neoplasm of skin: Secondary | ICD-10-CM

## 2022-08-15 DIAGNOSIS — Z79890 Hormone replacement therapy (postmenopausal): Secondary | ICD-10-CM

## 2022-08-15 LAB — HEPATIC FUNCTION PANEL
ALT: 12 U/L (ref 0–44)
AST: 16 U/L (ref 15–41)
Albumin: 4 g/dL (ref 3.5–5.0)
Alkaline Phosphatase: 28 U/L — ABNORMAL LOW (ref 38–126)
Bilirubin, Direct: 0.1 mg/dL (ref 0.0–0.2)
Indirect Bilirubin: 0.4 mg/dL (ref 0.3–0.9)
Total Bilirubin: 0.5 mg/dL (ref 0.3–1.2)
Total Protein: 5.9 g/dL — ABNORMAL LOW (ref 6.5–8.1)

## 2022-08-15 LAB — CBC
HCT: 34.1 % — ABNORMAL LOW (ref 36.0–46.0)
Hemoglobin: 11.7 g/dL — ABNORMAL LOW (ref 12.0–15.0)
MCH: 31 pg (ref 26.0–34.0)
MCHC: 34.3 g/dL (ref 30.0–36.0)
MCV: 90.2 fL (ref 80.0–100.0)
Platelets: 228 10*3/uL (ref 150–400)
RBC: 3.78 MIL/uL — ABNORMAL LOW (ref 3.87–5.11)
RDW: 14 % (ref 11.5–15.5)
WBC: 12.9 10*3/uL — ABNORMAL HIGH (ref 4.0–10.5)
nRBC: 0 % (ref 0.0–0.2)

## 2022-08-15 LAB — BASIC METABOLIC PANEL
Anion gap: 13 (ref 5–15)
BUN: 60 mg/dL — ABNORMAL HIGH (ref 8–23)
CO2: 22 mmol/L (ref 22–32)
Calcium: 9 mg/dL (ref 8.9–10.3)
Chloride: 102 mmol/L (ref 98–111)
Creatinine, Ser: 2.45 mg/dL — ABNORMAL HIGH (ref 0.44–1.00)
GFR, Estimated: 18 mL/min — ABNORMAL LOW (ref >60)
Glucose, Bld: 125 mg/dL — ABNORMAL HIGH (ref 70–99)
Potassium: 4.3 mmol/L (ref 3.5–5.1)
Sodium: 137 mmol/L (ref 135–145)

## 2022-08-15 LAB — MAGNESIUM: Magnesium: 1.7 mg/dL (ref 1.7–2.4)

## 2022-08-15 LAB — URINALYSIS, ROUTINE W REFLEX MICROSCOPIC
Bilirubin Urine: NEGATIVE
Glucose, UA: NEGATIVE mg/dL
Hgb urine dipstick: NEGATIVE
Ketones, ur: NEGATIVE mg/dL
Nitrite: NEGATIVE
Protein, ur: NEGATIVE mg/dL
Specific Gravity, Urine: 1.014 (ref 1.005–1.030)
pH: 5 (ref 5.0–8.0)

## 2022-08-15 LAB — LACTIC ACID, PLASMA
Lactic Acid, Venous: 1.7 mmol/L (ref 0.5–1.9)
Lactic Acid, Venous: 1.9 mmol/L (ref 0.5–1.9)

## 2022-08-15 LAB — PROTIME-INR
INR: 1 (ref 0.8–1.2)
Prothrombin Time: 13.6 seconds (ref 11.4–15.2)

## 2022-08-15 LAB — TROPONIN I (HIGH SENSITIVITY)
Troponin I (High Sensitivity): 4 ng/L (ref <18)
Troponin I (High Sensitivity): 4 ng/L (ref <18)

## 2022-08-15 LAB — CBG MONITORING, ED: Glucose-Capillary: 114 mg/dL — ABNORMAL HIGH (ref 70–99)

## 2022-08-15 MED ORDER — SODIUM CHLORIDE 0.9% FLUSH
3.0000 mL | Freq: Two times a day (BID) | INTRAVENOUS | Status: DC
  Administered 2022-08-15 – 2022-08-16 (×2): 3 mL via INTRAVENOUS

## 2022-08-15 MED ORDER — LACTATED RINGERS IV SOLN: INTRAVENOUS | Status: AC

## 2022-08-15 MED ORDER — ACETAMINOPHEN 325 MG PO TABS
650.0000 mg | ORAL_TABLET | Freq: Four times a day (QID) | ORAL | Status: DC | PRN
  Administered 2022-08-17: 650 mg via ORAL
  Filled 2022-08-15: qty 2

## 2022-08-15 MED ORDER — ACETAMINOPHEN 650 MG RE SUPP: 650.0000 mg | Freq: Four times a day (QID) | RECTAL | Status: DC | PRN

## 2022-08-15 MED ORDER — OXYCODONE HCL 5 MG PO TABS
2.5000 mg | ORAL_TABLET | ORAL | Status: AC | PRN
  Administered 2022-08-16: 5 mg via ORAL
  Administered 2022-08-16: 2.5 mg via ORAL
  Administered 2022-08-17: 5 mg via ORAL
  Filled 2022-08-15 (×3): qty 1

## 2022-08-15 MED ORDER — OXYCODONE-ACETAMINOPHEN 5-325 MG PO TABS
1.0000 | ORAL_TABLET | Freq: Once | ORAL | Status: AC
  Administered 2022-08-15: 1 via ORAL
  Filled 2022-08-15: qty 1

## 2022-08-15 MED ORDER — LACTATED RINGERS IV SOLN: INTRAVENOUS | Status: DC

## 2022-08-15 MED ORDER — ONDANSETRON HCL 4 MG PO TABS: 4.0000 mg | ORAL_TABLET | Freq: Four times a day (QID) | ORAL | Status: DC | PRN

## 2022-08-15 MED ORDER — ONDANSETRON HCL 4 MG/2ML IJ SOLN: 4.0000 mg | Freq: Four times a day (QID) | INTRAMUSCULAR | Status: DC | PRN

## 2022-08-15 MED ORDER — LACTATED RINGERS IV BOLUS (SEPSIS)
500.0000 mL | Freq: Once | INTRAVENOUS | Status: AC
  Administered 2022-08-15: 500 mL via INTRAVENOUS

## 2022-08-15 MED ORDER — LACTATED RINGERS IV BOLUS
500.0000 mL | Freq: Once | INTRAVENOUS | Status: AC
  Administered 2022-08-15: 500 mL via INTRAVENOUS

## 2022-08-15 MED ORDER — TRAZODONE HCL 100 MG PO TABS
200.0000 mg | ORAL_TABLET | Freq: Every day | ORAL | Status: DC
  Administered 2022-08-15 – 2022-08-16 (×2): 200 mg via ORAL
  Filled 2022-08-15 (×2): qty 2

## 2022-08-15 MED ORDER — HEPARIN SODIUM (PORCINE) 5000 UNIT/ML IJ SOLN
5000.0000 [IU] | Freq: Three times a day (TID) | INTRAMUSCULAR | Status: DC
  Administered 2022-08-16 – 2022-08-17 (×4): 5000 [IU] via SUBCUTANEOUS
  Filled 2022-08-15 (×4): qty 1

## 2022-08-15 MED ORDER — ASPIRIN 81 MG PO TBEC
81.0000 mg | DELAYED_RELEASE_TABLET | Freq: Every day | ORAL | Status: DC
  Administered 2022-08-16 – 2022-08-17 (×2): 81 mg via ORAL
  Filled 2022-08-15 (×2): qty 1

## 2022-08-15 NOTE — ED Provider Notes (Signed)
[facility] Provider Note   CSN: [id number] Arrival date & time: 08/15/22  1511     History  Chief Complaint  Patient presents with   Dizziness    [name] is a 88 y.o. female.   Dizziness Patient presents for hypotension. Medical history includes HLD, depression, neuropathy, HTN, GERD, arthritis, breast CA, anemia, fibromyalgia and chronic pain.  She states that she has noticed dizziness with standing and ambulation over the past 6 months.  She feels that it has worsened over the past 2 weeks.  She attributed this to vertigo and feels that this may have been worsened by a cervical injection that she received a week and a half ago for treatment of cervical radiculopathy.  2 nights ago, patient awoke and experienced pain in her left wrist.  She denies any recent injuries to her left wrist.  Pain was persistent and this prompted her to go to [facility] urgent care earlier today.  While there, they checked her vital signs and found her blood pressure to be low.  Patient currently takes lisinopril-HCTZ for hypertension.  Her last dose of this was yesterday around lunchtime.  She does not check her blood pressures at home.  She denies any known recent bleeding or dark stools.  She has had a normal amount of p.o. intake and has not had any vomiting or diarrhea.  She lives at home with her son.  She is highly functional and continues to drive and perform her own ADLs.  Currently, at rest, she is asymptomatic.  She does have symptoms of lightheadedness and near syncope with standing and walking.  She denies any syncopal episodes.  She denies any other recent symptoms.     Home Medications Prior to Admission medications   Medication Sig Start Date End Date Taking? Authorizing Provider  aspirin EC 81 MG tablet Take 81 mg by mouth daily.    [provider]  bimatoprost (LUMIGAN) 0.01 % SOLN Place 1 drop into both eyes at bedtime.    [provider]  CONTRAVE 8-90 MG TB12 Take 1 tablet by mouth daily. 03/28/17   [provider]  Docusate Calcium (STOOL SOFTENER PO) Take 100 mg by mouth 2 (two) times daily.     [provider]  estradiol (CLIMARA - DOSED IN MG/24 HR) 0.05 mg/24hr patch Place 1 patch onto the skin once a week. Changes every thursday 06/02/15   [provider]  guaiFENesin-codeine 100-10 MG/5ML syrup Take 10 mLs by mouth every 4 (four) hours. 03/24/17   [provider]  lisinopril-hydrochlorothiazide (PRINZIDE,ZESTORETIC) 20-25 MG per tablet Take 1 tablet by mouth every morning.    [provider]  methocarbamol (ROBAXIN) 500 MG tablet Take 1 tablet (500 mg total) by mouth every 6 (six) hours as needed for muscle spasms. 12/18/14   Perkins, Alexzandrew L, PA-C  Miconazole Nitrate-Wipes (MONISTAT 3 TRIPLE ACTION VA) Place 1 suppository vaginally daily.    [provider]  Multiple Vitamins-Minerals (SENIOR VITES PO) Take 1 tablet by mouth daily.    [provider]  omeprazole (PRILOSEC) 40 MG capsule Take 40 mg by mouth daily.    [provider]  simvastatin (ZOCOR) 20 MG tablet Take 1 tablet (20 mg total) by mouth at bedtime. Resume in a week from now 04/27/17   Florencia Reasons, MD  timolol (TIMOPTIC) 0.5 % ophthalmic solution Place 1 drop into both eyes daily after breakfast.    [provider]  traZODone (DESYREL) 100  MG tablet Take 200 mg by mouth at bedtime.     [provider]      Allergies    Procaine and Epinephrine    Review of Systems   Review of Systems  Musculoskeletal:  Positive for arthralgias.  Neurological:  Positive for dizziness and light-headedness.  All other systems reviewed and are negative.   Physical Exam Updated Vital Signs BP 133/73 (BP Location: Left Arm)   Pulse 78   Temp 98.4 F (36.9 C)   Resp 18   Ht '5\' 10"'$  (1.778 m)   Wt 99.9 kg   SpO2 97%   BMI 31.60 kg/m  Physical Exam Vitals and nursing note  reviewed.  Constitutional:      General: She is not in acute distress.    Appearance: Normal appearance. She is well-developed and normal weight. She is not ill-appearing, toxic-appearing or diaphoretic.  HENT:     Head: Normocephalic and atraumatic.     Right Ear: External ear normal.     Left Ear: External ear normal.     Nose: Nose normal.     Mouth/Throat:     Mouth: Mucous membranes are moist.     Pharynx: Oropharynx is clear.  Eyes:     Extraocular Movements: Extraocular movements intact.     Conjunctiva/sclera: Conjunctivae normal.  Cardiovascular:     Rate and Rhythm: Normal rate and regular rhythm.     Heart sounds: No murmur heard.    No friction rub. No gallop.  Pulmonary:     Effort: Pulmonary effort is normal. No respiratory distress.     Breath sounds: Normal breath sounds. No wheezing or rales.  Chest:     Chest wall: No tenderness.  Abdominal:     General: There is no distension.     Palpations: Abdomen is soft.     Tenderness: There is no abdominal tenderness.  Musculoskeletal:        General: No swelling. Normal range of motion.     Cervical back: Normal range of motion and neck supple.     Right lower leg: No edema.     Left lower leg: No edema.  Skin:    General: Skin is warm and dry.     Capillary Refill: Capillary refill takes less than 2 seconds.     Coloration: Skin is not jaundiced or pale.  Neurological:     General: No focal deficit present.     Mental Status: She is alert and oriented to person, place, and time.     Cranial Nerves: No cranial nerve deficit.     Sensory: No sensory deficit.     Motor: No weakness.     Coordination: Coordination normal.  Psychiatric:        Mood and Affect: Mood normal.        Behavior: Behavior normal.        Thought Content: Thought content normal.        Judgment: Judgment normal.     ED Results / Procedures / Treatments   Labs (all labs ordered are listed, but only abnormal results are  displayed) Labs Reviewed  BASIC METABOLIC PANEL - Abnormal; Notable for the following components:      Result Value   Glucose, Bld 125 (*)    BUN 60 (*)    Creatinine, Ser 2.45 (*)    GFR, Estimated 18 (*)    All other components within normal limits  CBC - Abnormal; Notable for the following components:  WBC 12.9 (*)    RBC 3.78 (*)    Hemoglobin 11.7 (*)    HCT 34.1 (*)    All other components within normal limits  URINALYSIS, ROUTINE W REFLEX MICROSCOPIC - Abnormal; Notable for the following components:   Leukocytes,Ua SMALL (*)    All other components within normal limits  HEPATIC FUNCTION PANEL - Abnormal; Notable for the following components:   Total Protein 5.9 (*)    Alkaline Phosphatase 28 (*)    All other components within normal limits  CBG MONITORING, ED - Abnormal; Notable for the following components:   Glucose-Capillary 114 (*)    All other components within normal limits  CULTURE, BLOOD (ROUTINE X 2)  CULTURE, BLOOD (ROUTINE X 2)  LACTIC ACID, PLASMA  LACTIC ACID, PLASMA  PROTIME-INR  MAGNESIUM  BASIC METABOLIC PANEL  MAGNESIUM  PHOSPHORUS  CBC  CORTISOL  TROPONIN I (HIGH SENSITIVITY)  TROPONIN I (HIGH SENSITIVITY)    EKG EKG Interpretation  Date/Time:  Monday August 15 2022 15:29:43 EDT Ventricular Rate:  89 PR Interval:  190 QRS Duration: 80 QT Interval:  344 QTC Calculation: 418 R Axis:   -6 Text Interpretation: Normal sinus rhythm Low voltage QRS Inferior infarct , age undetermined Cannot rule out Anterior infarct , age undetermined Abnormal ECG Confirmed by Godfrey Pick [phone]) on 08/15/2022 4:32:55 PM  Radiology CT ABDOMEN PELVIS WO CONTRAST  Result Date: 08/15/2022 CLINICAL DATA:  Nausea, vomiting, dizziness EXAM: CT ABDOMEN AND PELVIS WITHOUT CONTRAST TECHNIQUE: Multidetector CT imaging of the abdomen and pelvis was performed following the standard protocol without IV contrast. RADIATION DOSE REDUCTION: This exam was performed according  to the departmental dose-optimization program which includes automated exposure control, adjustment of the mA and/or kV according to patient size and/or use of iterative reconstruction technique. COMPARISON:  10/27/2011 FINDINGS: Lower chest: Visualized lower lung fields are unremarkable. Dense calcification is seen in mitral annulus. Hepatobiliary: There are few low-density foci in the liver with no significant interval change suggesting cysts or hemangiomas. There is no dilation of bile ducts. Surgical clips are seen in gallbladder fossa. Pancreas: No focal abnormalities are seen. Spleen: Unremarkable. Adrenals/Urinary Tract: Adrenals are not enlarged. There is no hydronephrosis. 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 There are no renal or ureteral stones. There are few smooth marginated low-density lesions in the kidneys largest measuring 5.9 cm in the midportion of left kidney suggesting renal cysts. No follow-up is recommended. Ureters are unremarkable. Urinary bladder is not distended. Stomach/Bowel: Stomach is unremarkable. Small bowel loops are not dilated. Appendix is not dilated. There is no significant wall thickening in colon. Scattered diverticula seen in colon without signs of focal acute diverticulitis. Vascular/Lymphatic: There are scattered arterial calcifications. Left renal vein is retroaortic. Reproductive: Uterus is not seen. Other: There is no ascites or pneumoperitoneum. Small umbilical hernia containing fat is seen. Musculoskeletal: There is 30% decrease in height of upper endplate of body of L3 vertebra. Similar finding was seen on the previous study suggesting old compression. There is encroachment of neural foramina by bony spurs and bulging of annulus at multiple levels. There is previous arthroplasty in both hips. IMPRESSION: There is no evidence of intestinal obstruction or pneumoperitoneum. There is no hydronephrosis. Appendix is not dilated. Bilateral renal cysts. Few diverticula  are seen in colon without signs of focal diverticulitis. Lumbar spondylosis. Other findings as described in the body of the report. Electronically Signed   By: Elmer Picker M.D.   On: 08/15/2022 17:48   DG  Chest Port 1 View  Result Date: 08/15/2022 CLINICAL DATA:  Possible sepsis EXAM: PORTABLE CHEST 1 VIEW COMPARISON:  Apr 18, 2017 FINDINGS: Stable cardiomediastinal contours. Unchanged calcified aortic atherosclerosis. No focal pulmonary opacity. No large pleural effusion or pneumothorax. No acute osseous abnormality. The visualized upper abdomen is unremarkable. IMPRESSION: No acute cardiopulmonary abnormality. Electronically Signed   By: Beryle Flock M.D.   On: 08/15/2022 16:15    Procedures Procedures    Medications Ordered in ED Medications  lactated ringers infusion ( Intravenous New Bag/Given 08/15/22 2304)  aspirin EC tablet 81 mg (has no administration in time range)  traZODone (DESYREL) tablet 200 mg (200 mg Oral Given 08/15/22 2348)  heparin injection 5,000 Units (has no administration in time range)  sodium chloride flush (NS) 0.9 % injection 3 mL (3 mLs Intravenous Given 08/15/22 2304)  acetaminophen (TYLENOL) tablet 650 mg (has no administration in time range)    Or  acetaminophen (TYLENOL) suppository 650 mg (has no administration in time range)  oxyCODONE (Oxy IR/ROXICODONE) immediate release tablet 2.5-5 mg (has no administration in time range)  ondansetron (ZOFRAN) tablet 4 mg (has no administration in time range)    Or  ondansetron (ZOFRAN) injection 4 mg (has no administration in time range)  lactated ringers bolus 500 mL (0 mLs Intravenous Stopped 08/15/22 1953)  lactated ringers bolus 500 mL (0 mLs Intravenous Stopped 08/15/22 1919)  oxyCODONE-acetaminophen (PERCOCET/ROXICET) 5-325 MG per tablet 1 tablet (1 tablet Oral Given 08/15/22 2153)    ED Course/ Medical Decision Making/ A&P                           Medical Decision Making Amount and/or Complexity  of Data Reviewed Labs: ordered. Radiology: ordered.  Risk Prescription drug management. Decision regarding hospitalization.   This patient presents to the ED for concern of hypotension, this involves an extensive number of treatment options, and is a complaint that carries with it a high risk of complications and morbidity.  The differential diagnosis includes dehydration, anemia, polypharmacy, sepsis, CHF, valvular dysfunction   Co morbidities that complicate the patient evaluation  HLD, depression, neuropathy, HTN, GERD, arthritis, breast CA, anemia, fibromyalgia and chronic pain   Additional history obtained:  Additional history obtained from patient's son External records from outside source obtained and reviewed including EMR   Lab Tests:  I Ordered, and personally interpreted labs.  The pertinent results include: Hemoglobin similar to levels from 5 years ago.  Leukocytosis is present.  Patient has elevated creatinine and BUN concerning for AKI.  Urinalysis is equivocal.  Electrolytes are normal.   Imaging Studies ordered:  I ordered imaging studies including chest x-ray, CT of abdomen and pelvis I independently visualized and interpreted imaging which showed no acute findings I agree with the radiologist interpretation   Cardiac Monitoring: / EKG:  The patient was maintained on a cardiac monitor.  I personally viewed and interpreted the cardiac monitored which showed an underlying rhythm of: Sinus rhythm   Problem List / ED Course / Critical interventions / Medication management  Patient is a pleasant, highly functional 88 year old female presenting for incidental finding of hypotension while at [facility] urgent [facility] this morning.  She was being seen at that time for left wrist pain.  She did undergo x-ray imaging which she was told showed no acute findings.  She was given a left wrist brace.  She has no further concerns of her left wrist.  However, while she was  at urgent  care, they checked her vital signs and found her to be hypotensive.  For this reason, patient presents to the ED.  On arrival in the ED, patient has soft blood pressures.  She denies any symptoms while at rest.  She does state that she has had orthostatic dizziness and near syncopal symptoms over the past 6 months.  She does not check her blood pressure at home and may have had ongoing low blood pressures over that time.  She does take a lisinopril-HCTZ medication for high blood pressure.  Her last dose of this was yesterday at midday.  Patient is well-appearing on exam.  No cardiac murmurs or rubs are appreciated.  Lungs are clear to auscultation.  Abdomen is soft and without tenderness.  Patient was given IV fluids for her hypotension.  Laboratory work-up was initiated.  Laboratory results are notable for a creatinine of 2.45.  Per chart review, patient has a normal creatinine at baseline.  Unfortunately, records in EMR only show lab work in 2018 for comparison.  Patient states that she does follow-up with a primary care doctor and did have lab work approximately 4 months ago.  She has never been told that she has kidney disease.  I suspect that her increased creatinine is acute to subacute.  This is likely contributing to her low blood pressures.  Following 1 L bolus IVF, patient's blood pressure has normalized.  Given her age and AKI, patient was admitted to hospitalist for further management. I ordered medication including IV fluids for dehydration Reevaluation of the patient after these medicines showed that the patient improved I have reviewed the patients home medicines and have made adjustments as needed   Social Determinants of Health:  Lives independently        Final Clinical Impression(s) / ED Diagnoses Final diagnoses:  AKI (acute kidney injury) [facility])    Rx / [facility] Orders ED Discharge Orders     None         Godfrey Pick, MD 08/15/22 2355

## 2022-08-15 NOTE — ED Notes (Signed)
No/little dizziness while lying. Dizziness increases upon standing or sitting. No current pain.

## 2022-08-15 NOTE — H&P (Signed)
History and Physical    [name] OTL:[id number] DOB: 08/03/1930 DOA: 08/15/2022  PCP: Kristie Cowman, MD   Patient coming from: Home   Chief Complaint: Low BP   HPI: [name] is a very pleasant 88 y.o. female with medical history significant for hypertension and cervical radiculopathy who presents to the emergency department for evaluation of hypotension.  Patient reports a long history of lightheadedness on standing but this worsened significantly over the past 5 days or so with a couple episodes of near syncope.  She denies any chest pain or palpitations associated with this, has not been bleeding, and denied fever or chills.  She woke with severe atraumatic left wrist pain, sought evaluation in her orthopedic clinic, was given a splint for the wrist, but she was noted to be hypotensive and directed to the ED for evaluation.  [facility] ED Course: Upon arrival to the ED, patient is found to be hypotensive with blood pressure as low as 80/58 with normal heart rate and no fever.  Chest x-ray is negative for acute cardiopulmonary disease and CT of the abdomen and pelvis is negative for acute findings.  Lactic acid and troponin were both normal x2.  Chemistry panel notable for BUN of 60 and creatinine 2.45.  Patient was given a liter of LR, Percocet, and transferred to [facility] for admission.  Review of Systems:  All other systems reviewed and apart from HPI, are negative.  Past Medical History:  Diagnosis Date   Anemia    Arthritis    Complication of anesthesia    "I GOT PNEUMONIA THE NEXT DAY FROM THE ANESTHESIA"   Depression    Fibromyalgia    GERD (gastroesophageal reflux disease)    History of skin cancer    HLD (hyperlipidemia)    HTN (hypertension)    Internal hemorrhoids    Osteoarthritis    Peripheral neuropathy    Shortness of breath    Skin cancer    SUI (stress urinary incontinence, female)    Ulcerative colitis     Past Surgical History:   Procedure Laterality Date   ABDOMINAL HYSTERECTOMY     APPENDECTOMY     BREAST EXCISIONAL BIOPSY Left 08/10/2011    Dr Margot Chimes   BREAST SURGERY     CARPAL TUNNEL RELEASE Left    CESAREAN SECTION     CHOLECYSTECTOMY     COLONOSCOPY  04/29/2003   internal hemorrhoids   JOINT REPLACEMENT Bilateral 2010 and 2012   KNEES   TOTAL HIP ARTHROPLASTY Right 08/06/2014   Procedure: RIGHT TOTAL HIP ARTHROPLASTY ANTERIOR APPROACH;  Surgeon: Gearlean Alf, MD;  Location: [facility];  Service: Orthopedics;  Laterality: Right;   TOTAL HIP ARTHROPLASTY Left 12/17/2014   Procedure: LEFT TOTAL HIP ARTHROPLASTY ANTERIOR APPROACH;  Surgeon: Gearlean Alf, MD;  Location: [facility];  Service: Orthopedics;  Laterality: Left;    Social History:   reports that she has never smoked. She has never used smokeless tobacco. She reports that she does not drink alcohol and does not use drugs.  Allergies  Allergen Reactions   Procaine     Novocaine caused rash and face blisters.   Epinephrine     Rapid heart beat    Family History  Problem Relation Age of Onset   Colon cancer Mother 34   Diabetes Brother      Prior to Admission medications   Medication Sig Start Date End Date Taking? Authorizing Provider  aspirin EC 81 MG tablet  Take 81 mg by mouth daily.    [provider]  bimatoprost (LUMIGAN) 0.01 % SOLN Place 1 drop into both eyes at bedtime.    [provider]  CONTRAVE 8-90 MG TB12 Take 1 tablet by mouth daily. 03/28/17   [provider]  Docusate Calcium (STOOL SOFTENER PO) Take 100 mg by mouth 2 (two) times daily.     [provider]  estradiol (CLIMARA - DOSED IN MG/24 HR) 0.05 mg/24hr patch Place 1 patch onto the skin once a week. Changes every thursday 06/02/15   [provider]  guaiFENesin-codeine 100-10 MG/5ML syrup Take 10 mLs by mouth every 4 (four) hours. 03/24/17   [provider]  lisinopril-hydrochlorothiazide (PRINZIDE,ZESTORETIC) 20-25 MG  per tablet Take 1 tablet by mouth every morning.    [provider]  methocarbamol (ROBAXIN) 500 MG tablet Take 1 tablet (500 mg total) by mouth every 6 (six) hours as needed for muscle spasms. 12/18/14   Perkins, Alexzandrew L, PA-C  Miconazole Nitrate-Wipes (MONISTAT 3 TRIPLE ACTION VA) Place 1 suppository vaginally daily.    [provider]  Multiple Vitamins-Minerals (SENIOR VITES PO) Take 1 tablet by mouth daily.    [provider]  omeprazole (PRILOSEC) 40 MG capsule Take 40 mg by mouth daily.    [provider]  simvastatin (ZOCOR) 20 MG tablet Take 1 tablet (20 mg total) by mouth at bedtime. Resume in a week from now 04/27/17   Florencia Reasons, MD  timolol (TIMOPTIC) 0.5 % ophthalmic solution Place 1 drop into both eyes daily after breakfast.    [provider]  traZODone (DESYREL) 100 MG tablet Take 200 mg by mouth at bedtime.     [provider]    Physical Exam: Vitals:   08/15/22 1918 08/15/22 1945 08/15/22 2150 08/15/22 2242  BP: 117/65 126/66 125/60 133/73  Pulse: 76 77 97 78  Resp: '19 19 15 18  '$ Temp: 97.6 F (36.4 C)  98.2 F (36.8 C) 98.4 F (36.9 C)  TempSrc: Oral  Oral   SpO2: 95% 94% 97% 97%  Weight:      Height:        Constitutional: NAD, calm  Eyes: PERTLA, lids and conjunctivae normal ENMT: Mucous membranes are moist. Posterior pharynx clear of any exudate or lesions.   Neck: supple, no masses  Respiratory: no wheezing, no crackles. No accessory muscle use.  Cardiovascular: S1 & S2 heard, regular rate and rhythm. No extremity edema.   Abdomen: No distension, no tenderness, soft. Bowel sounds active.  Musculoskeletal: no clubbing / cyanosis. No joint deformity upper and lower extremities.   Skin: no significant rashes, lesions, ulcers. Warm, dry, well-perfused. Neurologic: CN 2-12 grossly intact. Moving all extremities. Alert and oriented to person, place, and situation.  Psychiatric: Very pleasant. Cooperative.     Labs and Imaging on Admission: I have personally reviewed following labs and imaging studies  CBC: Recent Labs  Lab 08/15/22 1538  WBC 12.9*  HGB 11.7*  HCT 34.1*  MCV 90.2  PLT 834   Basic Metabolic Panel: Recent Labs  Lab 08/15/22 1538 08/15/22 1558  NA 137  --   K 4.3  --   CL 102  --   CO2 22  --   GLUCOSE 125*  --   BUN 60*  --   CREATININE 2.45*  --   CALCIUM 9.0  --   MG  --  1.7   GFR: Estimated Creatinine Clearance: 19.5 mL/min (A) (by C-G formula  based on SCr of 2.45 mg/dL (H)). Liver Function Tests: Recent Labs  Lab 08/15/22 1558  AST 16  ALT 12  ALKPHOS 28*  BILITOT 0.5  PROT 5.9*  ALBUMIN 4.0   No results for input(s): "LIPASE", "AMYLASE" in the last 168 hours. No results for input(s): "AMMONIA" in the last 168 hours. Coagulation Profile: Recent Labs  Lab 08/15/22 1558  INR 1.0   Cardiac Enzymes: No results for input(s): "CKTOTAL", "CKMB", "CKMBINDEX", "TROPONINI" in the last 168 hours. BNP (last 3 results) No results for input(s): "PROBNP" in the last 8760 hours. HbA1C: No results for input(s): "HGBA1C" in the last 72 hours. CBG: Recent Labs  Lab 08/15/22 1536  GLUCAP 114*   Lipid Profile: No results for input(s): "CHOL", "HDL", "LDLCALC", "TRIG", "CHOLHDL", "LDLDIRECT" in the last 72 hours. Thyroid Function Tests: No results for input(s): "TSH", "T4TOTAL", "FREET4", "T3FREE", "THYROIDAB" in the last 72 hours. Anemia Panel: No results for input(s): "VITAMINB12", "FOLATE", "FERRITIN", "TIBC", "IRON", "RETICCTPCT" in the last 72 hours. Urine analysis:    Component Value Date/Time   COLORURINE YELLOW 08/15/2022 1825   APPEARANCEUR CLEAR 08/15/2022 1825   LABSPEC 1.014 08/15/2022 1825   PHURINE 5.0 08/15/2022 1825   GLUCOSEU NEGATIVE 08/15/2022 1825   HGBUR NEGATIVE 08/15/2022 1825   BILIRUBINUR NEGATIVE 08/15/2022 1825   KETONESUR NEGATIVE 08/15/2022 1825   PROTEINUR NEGATIVE 08/15/2022 1825   UROBILINOGEN 0.2 12/11/2014  1120   NITRITE NEGATIVE 08/15/2022 1825   LEUKOCYTESUR SMALL (A) 08/15/2022 1825   Sepsis Labs: '@LABRCNTIP'$ (procalcitonin:4,lacticidven:4) )No results found for this or any previous visit (from the past 240 hour(s)).   Radiological Exams on Admission: CT ABDOMEN PELVIS WO CONTRAST  Result Date: 08/15/2022 CLINICAL DATA:  Nausea, vomiting, dizziness EXAM: CT ABDOMEN AND PELVIS WITHOUT CONTRAST TECHNIQUE: Multidetector CT imaging of the abdomen and pelvis was performed following the standard protocol without IV contrast. RADIATION DOSE REDUCTION: This exam was performed according to the departmental dose-optimization program which includes automated exposure control, adjustment of the mA and/or kV according to patient size and/or use of iterative reconstruction technique. COMPARISON:  10/27/2011 FINDINGS: Lower chest: Visualized lower lung fields are unremarkable. Dense calcification is seen in mitral annulus. Hepatobiliary: There are few low-density foci in the liver with no significant interval change suggesting cysts or hemangiomas. There is no dilation of bile ducts. Surgical clips are seen in gallbladder fossa. Pancreas: No focal abnormalities are seen. Spleen: Unremarkable. Adrenals/Urinary Tract: Adrenals are not enlarged. There is no hydronephrosis. 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 There are no renal or ureteral stones. There are few smooth marginated low-density lesions in the kidneys largest measuring 5.9 cm in the midportion of left kidney suggesting renal cysts. No follow-up is recommended. Ureters are unremarkable. Urinary bladder is not distended. Stomach/Bowel: Stomach is unremarkable. Small bowel loops are not dilated. Appendix is not dilated. There is no significant wall thickening in colon. Scattered diverticula seen in colon without signs of focal acute diverticulitis. Vascular/Lymphatic: There are scattered arterial calcifications. Left renal vein is retroaortic. Reproductive:  Uterus is not seen. Other: There is no ascites or pneumoperitoneum. Small umbilical hernia containing fat is seen. Musculoskeletal: There is 30% decrease in height of upper endplate of body of L3 vertebra. Similar finding was seen on the previous study suggesting old compression. There is encroachment of neural foramina by bony spurs and bulging of annulus at multiple levels. There is previous arthroplasty in both hips. IMPRESSION: There is no evidence of intestinal obstruction or pneumoperitoneum. There is no hydronephrosis. Appendix  is not dilated. Bilateral renal cysts. Few diverticula are seen in colon without signs of focal diverticulitis. Lumbar spondylosis. Other findings as described in the body of the report. Electronically Signed   By: Elmer Picker M.D.   On: 08/15/2022 17:48   DG Chest Port 1 View  Result Date: 08/15/2022 CLINICAL DATA:  Possible sepsis EXAM: PORTABLE CHEST 1 VIEW COMPARISON:  Apr 18, 2017 FINDINGS: Stable cardiomediastinal contours. Unchanged calcified aortic atherosclerosis. No focal pulmonary opacity. No large pleural effusion or pneumothorax. No acute osseous abnormality. The visualized upper abdomen is unremarkable. IMPRESSION: No acute cardiopulmonary abnormality. Electronically Signed   By: Beryle Flock M.D.   On: 08/15/2022 16:15    EKG: Independently reviewed. Sinus rhythm.   Assessment/Plan   1. AKI  - Presents with lightheadedness on standing, found to be hypotensive, and found to have SCr of 2.45, up from 0.5 in 2018  - Hypotension resolved with IVF in ED  - No hydronephrosis on CT in ED  - Hold lisinopril-HCTZ, continue IVF hydration, renally-dose medications, repeat serum chemistries in am    2. Hypotension  - SBP as low as 80 in ED  - She responded well to IVF, she is asymptomatic with no apparent bleeding or infection, and this is likely from hypovolemia and antihypertensive use  - Hold antihypertensives, continue IVF hydration, check cortisol     DVT prophylaxis: sq heparin  Code Status: Full  Level of Care: Level of care: Telemetry Family Communication: none present  Disposition Plan:  Patient is from: home  Anticipated d/c is to: home  Anticipated d/c date is: 08/17/22  Patient currently: Pending improved/stable renal function Consults called: none  Admission status: Observation     Vianne Bulls, MD [facility]  08/15/2022, 11:33 PM

## 2022-08-15 NOTE — ED Triage Notes (Signed)
Patient here POV from Home.  Endorses Dizziness that began approximately 10 Days ago. States Symptoms of Dizziness began when the Patient began to have "Nerve Block" Shots in Neck Area.  States she went to UC this AM to have Left Wrist examined as Patient may have injured it Saturday AM. Imaging was Negative for Acute Injury but was told to seek ED Evaluation for Hypotension.   NAD Noted during Triage. A&Ox4. GCS 15. BIB Wheelchair.

## 2022-08-15 NOTE — ED Notes (Signed)
Notified attending provider of pt's reported 8/10 left wrist pain. Awaiting orders.

## 2022-08-16 ENCOUNTER — Encounter (HOSPITAL_COMMUNITY): Payer: Self-pay

## 2022-08-16 DIAGNOSIS — I10 Essential (primary) hypertension: Secondary | ICD-10-CM | POA: Diagnosis present

## 2022-08-16 DIAGNOSIS — N179 Acute kidney failure, unspecified: Secondary | ICD-10-CM | POA: Diagnosis present

## 2022-08-16 DIAGNOSIS — G629 Polyneuropathy, unspecified: Secondary | ICD-10-CM | POA: Diagnosis present

## 2022-08-16 DIAGNOSIS — Z7982 Long term (current) use of aspirin: Secondary | ICD-10-CM | POA: Diagnosis not present

## 2022-08-16 DIAGNOSIS — F32A Depression, unspecified: Secondary | ICD-10-CM | POA: Diagnosis present

## 2022-08-16 DIAGNOSIS — E785 Hyperlipidemia, unspecified: Secondary | ICD-10-CM | POA: Diagnosis present

## 2022-08-16 DIAGNOSIS — Z888 Allergy status to other drugs, medicaments and biological substances status: Secondary | ICD-10-CM | POA: Diagnosis not present

## 2022-08-16 DIAGNOSIS — M797 Fibromyalgia: Secondary | ICD-10-CM | POA: Diagnosis present

## 2022-08-16 DIAGNOSIS — Z79899 Other long term (current) drug therapy: Secondary | ICD-10-CM | POA: Diagnosis not present

## 2022-08-16 DIAGNOSIS — Z20822 Contact with and (suspected) exposure to covid-19: Secondary | ICD-10-CM | POA: Diagnosis present

## 2022-08-16 DIAGNOSIS — E861 Hypovolemia: Secondary | ICD-10-CM | POA: Diagnosis present

## 2022-08-16 DIAGNOSIS — Z79891 Long term (current) use of opiate analgesic: Secondary | ICD-10-CM | POA: Diagnosis not present

## 2022-08-16 DIAGNOSIS — E860 Dehydration: Secondary | ICD-10-CM | POA: Diagnosis present

## 2022-08-16 DIAGNOSIS — Z85828 Personal history of other malignant neoplasm of skin: Secondary | ICD-10-CM | POA: Diagnosis not present

## 2022-08-16 DIAGNOSIS — M1990 Unspecified osteoarthritis, unspecified site: Secondary | ICD-10-CM | POA: Diagnosis present

## 2022-08-16 DIAGNOSIS — Z90710 Acquired absence of both cervix and uterus: Secondary | ICD-10-CM | POA: Diagnosis not present

## 2022-08-16 DIAGNOSIS — Z96641 Presence of right artificial hip joint: Secondary | ICD-10-CM | POA: Diagnosis present

## 2022-08-16 DIAGNOSIS — M25532 Pain in left wrist: Secondary | ICD-10-CM | POA: Diagnosis present

## 2022-08-16 DIAGNOSIS — I9589 Other hypotension: Secondary | ICD-10-CM | POA: Diagnosis present

## 2022-08-16 DIAGNOSIS — Z79890 Hormone replacement therapy (postmenopausal): Secondary | ICD-10-CM | POA: Diagnosis not present

## 2022-08-16 DIAGNOSIS — K219 Gastro-esophageal reflux disease without esophagitis: Secondary | ICD-10-CM | POA: Diagnosis present

## 2022-08-16 DIAGNOSIS — Z853 Personal history of malignant neoplasm of breast: Secondary | ICD-10-CM | POA: Diagnosis not present

## 2022-08-16 DIAGNOSIS — M5412 Radiculopathy, cervical region: Secondary | ICD-10-CM | POA: Diagnosis present

## 2022-08-16 DIAGNOSIS — Z9049 Acquired absence of other specified parts of digestive tract: Secondary | ICD-10-CM | POA: Diagnosis not present

## 2022-08-16 LAB — PHOSPHORUS: Phosphorus: 3.3 mg/dL (ref 2.5–4.6)

## 2022-08-16 LAB — CORTISOL: Cortisol, Plasma: 3.8 ug/dL

## 2022-08-16 LAB — CBC
HCT: 32.8 % — ABNORMAL LOW (ref 36.0–46.0)
Hemoglobin: 10.6 g/dL — ABNORMAL LOW (ref 12.0–15.0)
MCH: 31.1 pg (ref 26.0–34.0)
MCHC: 32.3 g/dL (ref 30.0–36.0)
MCV: 96.2 fL (ref 80.0–100.0)
Platelets: 166 10*3/uL (ref 150–400)
RBC: 3.41 MIL/uL — ABNORMAL LOW (ref 3.87–5.11)
RDW: 13.9 % (ref 11.5–15.5)
WBC: 7.4 10*3/uL (ref 4.0–10.5)
nRBC: 0 % (ref 0.0–0.2)

## 2022-08-16 LAB — MAGNESIUM: Magnesium: 1.8 mg/dL (ref 1.7–2.4)

## 2022-08-16 LAB — BASIC METABOLIC PANEL
Anion gap: 8 (ref 5–15)
BUN: 51 mg/dL — ABNORMAL HIGH (ref 8–23)
CO2: 23 mmol/L (ref 22–32)
Calcium: 8.7 mg/dL — ABNORMAL LOW (ref 8.9–10.3)
Chloride: 110 mmol/L (ref 98–111)
Creatinine, Ser: 1.75 mg/dL — ABNORMAL HIGH (ref 0.44–1.00)
GFR, Estimated: 27 mL/min — ABNORMAL LOW (ref >60)
Glucose, Bld: 107 mg/dL — ABNORMAL HIGH (ref 70–99)
Potassium: 3.7 mmol/L (ref 3.5–5.1)
Sodium: 141 mmol/L (ref 135–145)

## 2022-08-16 LAB — SARS CORONAVIRUS 2 BY RT PCR: SARS Coronavirus 2 by RT PCR: NEGATIVE

## 2022-08-16 MED ORDER — LATANOPROST 0.005 % OP SOLN
1.0000 [drp] | Freq: Every day | OPHTHALMIC | Status: DC
  Administered 2022-08-16: 1 [drp] via OPHTHALMIC
  Filled 2022-08-16: qty 2.5

## 2022-08-16 MED ORDER — TRAMADOL HCL 50 MG PO TABS
50.0000 mg | ORAL_TABLET | Freq: Two times a day (BID) | ORAL | Status: DC | PRN
  Administered 2022-08-16 – 2022-08-17 (×2): 50 mg via ORAL
  Filled 2022-08-16 (×2): qty 1

## 2022-08-16 MED ORDER — METHOCARBAMOL 500 MG PO TABS
500.0000 mg | ORAL_TABLET | Freq: Four times a day (QID) | ORAL | Status: DC | PRN
  Administered 2022-08-17: 500 mg via ORAL
  Filled 2022-08-16: qty 1

## 2022-08-16 MED ORDER — ATORVASTATIN CALCIUM 10 MG PO TABS: 10.0000 mg | ORAL_TABLET | Freq: Every day | ORAL | Status: DC

## 2022-08-16 MED ORDER — LORATADINE 10 MG PO TABS
10.0000 mg | ORAL_TABLET | Freq: Every day | ORAL | Status: DC | PRN
  Administered 2022-08-16: 10 mg via ORAL
  Filled 2022-08-16: qty 1

## 2022-08-16 MED ORDER — PANTOPRAZOLE SODIUM 40 MG PO TBEC
40.0000 mg | DELAYED_RELEASE_TABLET | Freq: Every day | ORAL | Status: DC
  Administered 2022-08-16 – 2022-08-17 (×2): 40 mg via ORAL
  Filled 2022-08-16 (×2): qty 1

## 2022-08-16 MED ORDER — TIMOLOL MALEATE 0.5 % OP SOLN
1.0000 [drp] | Freq: Two times a day (BID) | OPHTHALMIC | Status: DC
  Administered 2022-08-16 – 2022-08-17 (×2): 1 [drp] via OPHTHALMIC
  Filled 2022-08-16: qty 5

## 2022-08-16 MED ORDER — DORZOLAMIDE HCL-TIMOLOL MAL 2-0.5 % OP SOLN: 1.0000 [drp] | Freq: Two times a day (BID) | OPHTHALMIC | Status: DC

## 2022-08-16 MED ORDER — SODIUM CHLORIDE 0.9 % IV SOLN: INTRAVENOUS | Status: DC

## 2022-08-16 MED ORDER — DICLOFENAC SODIUM 1 % EX GEL
2.0000 g | Freq: Four times a day (QID) | CUTANEOUS | Status: AC
  Administered 2022-08-16 (×2): 2 g via TOPICAL
  Filled 2022-08-16: qty 100

## 2022-08-16 MED ORDER — TIMOLOL MALEATE 0.5 % OP SOLN: 1.0000 [drp] | Freq: Every day | OPHTHALMIC | Status: DC

## 2022-08-16 MED ORDER — DOCUSATE SODIUM 100 MG PO CAPS
100.0000 mg | ORAL_CAPSULE | Freq: Two times a day (BID) | ORAL | Status: DC
  Administered 2022-08-16 – 2022-08-17 (×3): 100 mg via ORAL
  Filled 2022-08-16 (×3): qty 1

## 2022-08-16 NOTE — Plan of Care (Signed)

## 2022-08-16 NOTE — Progress Notes (Signed)
Mobility Specialist Cancellation/Refusal Note:    08/16/22 1120  Mobility  Activity Refused mobility     Reason for Cancellation/Refusal: Pt declined mobility at this time. Pt waiting to take pain meds. Pt requested that I come back in the afternoon. Will check back as schedule permits.   [facility]

## 2022-08-16 NOTE — Progress Notes (Signed)
PROGRESS NOTE    [name]  OHY:[id number] DOB: [birth year] DOA: 08/15/2022 PCP: Kristie Cowman, MD    Chief Complaint  Patient presents with   Dizziness    Brief Narrative:  [name] is a very pleasant 88 y.o. female with medical history significant for hypertension and cervical radiculopathy who presents to the emergency department for evaluation of hypotension. Patient reports a long history of lightheadedness on standing but this worsened significantly over the past 5 days or so with a couple episodes of near syncope.   Upon arrival to the ED, patient is found to be hypotensive with blood pressure as low as 80/58 with normal heart rate and no fever.  Chest x-ray is negative for acute cardiopulmonary disease and CT of the abdomen and pelvis is negative for acute findings.  Lactic acid and troponin were both normal x2.  Chemistry panel notable for BUN of 60 and creatinine 2.45.  Patient was given a liter of LR, Percocet, and transferred to [facility] for admission.    Assessment & Plan:   Principal Problem:   AKI (acute kidney injury) ([place]) Active Problems:   Essential hypertension   Hypotension due to hypovolemia   Hypotension probably from hypovolemia from dehydration.  BP parameters have improved when compared to admission. Unclear what her baseline creatinine is.  Holding her home meds lisinopril and hydrochlorothiazide and lasix.    AKI;  On admission patient's creatinine is 2.45, improved to 1.75, unclear what her baseline creatinine is.  Recommend to continue with IV fluids for another 24 hours and continue to hold LIsinopril , HCTZ, and lasix.      Cervical Radiculopathy:  Pain control and physical therapy.     Left wrist pain;  Continue with splint.  Pain control and physical therapy.       DVT prophylaxis: Heparin.  Code Status: full code.  Family Communication: none at bedside.  Disposition:   Status is: Observation The  patient will require care spanning > 2 midnights and should be moved to inpatient because: IV fluids and AKI. And therapy evaluations.    Level of care: Telemetry Consultants:  None.   Procedures: none.   Antimicrobials: none.    Subjective: Pt reports feeling better.   Objective: Vitals:   08/16/22 0414 08/16/22 0416 08/16/22 0418 08/16/22 0420  BP: (!) 112/47 (!) 140/78 131/64 132/73  Pulse: 80 82 91 82  Resp: 20     Temp: (!) 97.5 F (36.4 C)     TempSrc:      SpO2: 98%     Weight:      Height:        Intake/Output Summary (Last 24 hours) at 08/16/2022 1015 Last data filed at 08/16/2022 0853 Gross per 24 hour  Intake 1597.02 ml  Output --  Net 1597.02 ml   Filed Weights   08/15/22 1526  Weight: 99.9 kg    Examination:  General exam: Appears calm and comfortable  Respiratory system: Clear to auscultation. Respiratory effort normal. Cardiovascular system: S1 & S2 heard, RRR. No JVD, No pedal edema. Gastrointestinal system: Abdomen is nondistended, soft and nontender. Normal bowel sounds heard. Central nervous system: Alert and oriented. No focal neurological deficits. Extremities: Symmetric 5 x 5 power. Skin: No rashes, lesions or ulcers Psychiatry: Mood & affect appropriate.     Data Reviewed: I have personally reviewed following labs and imaging studies  CBC: Recent Labs  Lab 08/15/22 1538 08/16/22 0547  WBC 12.9* 7.4  HGB  11.7* 10.6*  HCT 34.1* 32.8*  MCV 90.2 96.2  PLT 228 782    Basic Metabolic Panel: Recent Labs  Lab 08/15/22 1538 08/15/22 1558 08/16/22 0547  NA 137  --  141  K 4.3  --  3.7  CL 102  --  110  CO2 22  --  23  GLUCOSE 125*  --  107*  BUN 60*  --  51*  CREATININE 2.45*  --  1.75*  CALCIUM 9.0  --  8.7*  MG  --  1.7 1.8  PHOS  --   --  3.3    GFR: Estimated Creatinine Clearance: 27.4 mL/min (A) (by C-G formula based on SCr of 1.75 mg/dL (H)).  Liver Function Tests: Recent Labs  Lab 08/15/22 1558  AST 16  ALT  12  ALKPHOS 28*  BILITOT 0.5  PROT 5.9*  ALBUMIN 4.0    CBG: Recent Labs  Lab 08/15/22 1536  GLUCAP 114*     Recent Results (from the past 240 hour(s))  Blood Culture (routine x 2)     Status: None (Preliminary result)   Collection Time: 08/15/22  3:58 PM   Specimen: BLOOD  Result Value Ref Range Status   Specimen Description   Final    BLOOD BLOOD RIGHT HAND Performed at [facility], [address] [place], [place] [address]    Special Requests   Final    Blood Culture adequate volume BOTTLES DRAWN AEROBIC AND ANAEROBIC Performed at [facility], [address] [place], [place] [address]    Culture   Final    NO GROWTH < 12 HOURS Performed at [facility], [place] [address], [place], [place] [address]    Report Status PENDING  Incomplete  Blood Culture (routine x 2)     Status: None (Preliminary result)   Collection Time: 08/15/22  4:03 PM   Specimen: BLOOD  Result Value Ref Range Status   Specimen Description   Final    BLOOD BLOOD RIGHT FOREARM Performed at [facility], [address], [place], [place] [address]    Special Requests   Final    BOTTLES DRAWN AEROBIC AND ANAEROBIC Blood Culture adequate volume Performed at [facility], [address], [place], [place] [address]    Culture   Final    NO GROWTH < 12 HOURS Performed at [facility], [place] [address] [place], [place] [address]    Report Status PENDING  Incomplete  SARS Coronavirus 2 by RT PCR (hospital order, performed in [facility] hospital lab) *cepheid single result test* Anterior Nasal Swab     Status: None   Collection Time: 08/16/22  4:23 AM   Specimen: Anterior Nasal Swab  Result Value Ref Range Status   SARS Coronavirus 2 by RT PCR NEGATIVE NEGATIVE Final    Comment: (NOTE) SARS-CoV-2 target nucleic acids are NOT DETECTED.  The SARS-CoV-2 RNA is generally detectable in upper and  lower respiratory specimens during the acute phase of infection. The lowest concentration of SARS-CoV-2 viral copies this assay can detect is 250 copies / mL. A negative result does not preclude SARS-CoV-2 infection and should not be used as the sole basis for treatment or other patient management decisions.  A negative result may occur with improper specimen collection / handling, submission of specimen other than nasopharyngeal swab, presence of viral mutation(s) within the areas targeted by this assay, and inadequate number of viral copies (<250 copies / mL). A negative  result must be combined with clinical observations, patient history, and epidemiological information.  Fact Sheet for Patients:   https://www.patel.info/  Fact Sheet for Healthcare Providers: https://hall.com/  This test is not yet approved or  cleared by the [place] FDA and has been authorized for detection and/or diagnosis of SARS-CoV-2 by FDA under an Emergency Use Authorization (EUA).  This EUA will remain in effect (meaning this test can be used) for the duration of the COVID-19 declaration under Section 564(b)(1) of the Act, 21 U.S.C. section 360bbb-3(b)(1), unless the authorization is terminated or revoked sooner.  Performed at [facility], [place] [address] [place], [place] [address]          Radiology Studies: CT ABDOMEN PELVIS WO CONTRAST  Result Date: 08/15/2022 CLINICAL DATA:  Nausea, vomiting, dizziness EXAM: CT ABDOMEN AND PELVIS WITHOUT CONTRAST TECHNIQUE: Multidetector CT imaging of the abdomen and pelvis was performed following the standard protocol without IV contrast. RADIATION DOSE REDUCTION: This exam was performed according to the departmental dose-optimization program which includes automated exposure control, adjustment of the mA and/or kV according to patient size and/or use of iterative reconstruction technique.  COMPARISON:  10/27/2011 FINDINGS: Lower chest: Visualized lower lung fields are unremarkable. Dense calcification is seen in mitral annulus. Hepatobiliary: There are few low-density foci in the liver with no significant interval change suggesting cysts or hemangiomas. There is no dilation of bile ducts. Surgical clips are seen in gallbladder fossa. Pancreas: No focal abnormalities are seen. Spleen: Unremarkable. Adrenals/Urinary Tract: Adrenals are not enlarged. There is no hydronephrosis. 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 There are no renal or ureteral stones. There are few smooth marginated low-density lesions in the kidneys largest measuring 5.9 cm in the midportion of left kidney suggesting renal cysts. No follow-up is recommended. Ureters are unremarkable. Urinary bladder is not distended. Stomach/Bowel: Stomach is unremarkable. Small bowel loops are not dilated. Appendix is not dilated. There is no significant wall thickening in colon. Scattered diverticula seen in colon without signs of focal acute diverticulitis. Vascular/Lymphatic: There are scattered arterial calcifications. Left renal vein is retroaortic. Reproductive: Uterus is not seen. Other: There is no ascites or pneumoperitoneum. Small umbilical hernia containing fat is seen. Musculoskeletal: There is 30% decrease in height of upper endplate of body of L3 vertebra. Similar finding was seen on the previous study suggesting old compression. There is encroachment of neural foramina by bony spurs and bulging of annulus at multiple levels. There is previous arthroplasty in both hips. IMPRESSION: There is no evidence of intestinal obstruction or pneumoperitoneum. There is no hydronephrosis. Appendix is not dilated. Bilateral renal cysts. Few diverticula are seen in colon without signs of focal diverticulitis. Lumbar spondylosis. Other findings as described in the body of the report. Electronically Signed   By: Elmer Picker M.D.   On:  08/15/2022 17:48   DG Chest Port 1 View  Result Date: 08/15/2022 CLINICAL DATA:  Possible sepsis EXAM: PORTABLE CHEST 1 VIEW COMPARISON:  Apr 18, 2017 FINDINGS: Stable cardiomediastinal contours. Unchanged calcified aortic atherosclerosis. No focal pulmonary opacity. No large pleural effusion or pneumothorax. No acute osseous abnormality. The visualized upper abdomen is unremarkable. IMPRESSION: No acute cardiopulmonary abnormality. Electronically Signed   By: Beryle Flock M.D.   On: 08/15/2022 16:15        Scheduled Meds:  aspirin EC  81 mg Oral Daily   diclofenac Sodium  2 g Topical QID   heparin  5,000 Units Subcutaneous Q8H   sodium chloride flush  3 mL Intravenous Q12H   traZODone  200 mg Oral QHS   Continuous Infusions:   LOS: 0 days    Time spent: [place], MD [facility]   To contact the attending provider between 7A-7P or the covering provider during after hours 7P-7A, please log into the web site [email or web address] and access using universal [place] password for that web site. If you do not have the password, please call the hospital operator.  08/16/2022, 10:15 AM

## 2022-08-16 NOTE — Progress Notes (Signed)
Mobility Specialist - Progress Note   08/16/22 1508  Mobility  HOB Elevated/Bed Position Self regulated  Activity Ambulated with assistance in hallway  Range of Motion/Exercises Active  Level of Assistance Standby assist, set-up cues, supervision of patient - no hands on  Assistive Device Front wheel walker  Distance Ambulated (ft) 200 ft  Activity Response Tolerated well  Transport method Ambulatory  $Mobility charge 1 Mobility   Pt received in bed and agreeable to mobility.  Pt to room  after session with all needs met.    [facility]

## 2022-08-17 DIAGNOSIS — E861 Hypovolemia: Secondary | ICD-10-CM | POA: Diagnosis not present

## 2022-08-17 DIAGNOSIS — N179 Acute kidney failure, unspecified: Secondary | ICD-10-CM | POA: Diagnosis not present

## 2022-08-17 DIAGNOSIS — I10 Essential (primary) hypertension: Secondary | ICD-10-CM | POA: Diagnosis not present

## 2022-08-17 DIAGNOSIS — I9589 Other hypotension: Secondary | ICD-10-CM | POA: Diagnosis not present

## 2022-08-17 LAB — BASIC METABOLIC PANEL
Anion gap: 7 (ref 5–15)
BUN: 29 mg/dL — ABNORMAL HIGH (ref 8–23)
CO2: 25 mmol/L (ref 22–32)
Calcium: 9.2 mg/dL (ref 8.9–10.3)
Chloride: 107 mmol/L (ref 98–111)
Creatinine, Ser: 1.17 mg/dL — ABNORMAL HIGH (ref 0.44–1.00)
GFR, Estimated: 44 mL/min — ABNORMAL LOW (ref >60)
Glucose, Bld: 110 mg/dL — ABNORMAL HIGH (ref 70–99)
Potassium: 4 mmol/L (ref 3.5–5.1)
Sodium: 139 mmol/L (ref 135–145)

## 2022-08-17 MED ORDER — AMLODIPINE BESYLATE 2.5 MG PO TABS: 2.5000 mg | ORAL_TABLET | Freq: Every day | ORAL | 1 refills | Status: DC

## 2022-08-17 MED ORDER — OMEPRAZOLE 40 MG PO CPDR: 40.0000 mg | DELAYED_RELEASE_CAPSULE | Freq: Every day | ORAL | 0 refills | Status: AC

## 2022-08-17 NOTE — Progress Notes (Signed)
Mobility Specialist Cancellation/Refusal Note:    08/17/22 1028  Mobility  Activity Refused mobility    Reason for Cancellation/Refusal: Pt declined mobility at this time. Pt getting ready to head home. Will check back as schedule permits.       [facility]

## 2022-08-17 NOTE — Final Progress Note (Signed)
Pt discharged home, AVS and Rx reviewed with patient, no questions or concerns at time of discharge. IV removed by NT without complication. Sent home with all belongings, transported by Son via private vehicle.

## 2022-08-17 NOTE — Evaluation (Addendum)
Session was completed under the supervision of a licensed therapist and note was read, reviewed, edited and agree with student's findings and recommendations.      Occupational Therapy Evaluation Patient Details Name: [name] MRN: [id number] DOB: [birth year] Today's Date: 08/17/2022   History of Present Illness [name] is a very pleasant 88 y.o. female with medical history significant for hypertension and cervical radiculopathy who presents to the emergency department for evaluation of hypotension. She woke with severe atraumatic left wrist pain, sought evaluation in her orthopedic clinic, was given a splint for the wrist Chest x-ray is negative for acute cardiopulmonary disease and CT of the abdomen and pelvis is negative for acute findings. Patient admitted for AKI.   Clinical Impression   [name] is a 88 year old women that presents with above medical history. Prior to hospitalization patient completed all ADLs at moderate independence. Patient presents today with independent for bed mobility and supervision for ambulation around the bed. Overall patient presents near baseline in regards to ADLs, with assistance for set up for feeding due to baseline neuropathy. Patient provided with supervision during evaluation, with no overt loss of balance or impairments noted.  No OT needs at this time.       Recommendations for follow up therapy are one component of a multi-disciplinary discharge planning process, led by the attending physician.  Recommendations may be updated based on patient status, additional functional criteria and insurance authorization.   Follow Up Recommendations  No OT follow up    Assistance Recommended at Discharge PRN  Patient can return home with the following Assistance with cooking/housework    Functional Status Assessment  Patient has not had a recent decline in their functional status  Equipment Recommendations  None recommended by  OT    Recommendations for Other Services       Precautions / Restrictions Precautions Precautions: None Restrictions Weight Bearing Restrictions: No      Mobility Bed Mobility Overal bed mobility: Independent                  Transfers Overall transfer level: Needs assistance                 General transfer comment: Supervision for in room ambulation. Patient holding onto furniture to transfer around the bed.      Balance Overall balance assessment: Mild deficits observed, not formally tested (min guard for balance while ambulating to chair)                                         ADL either performed or assessed with clinical judgement   ADL Overall ADL's : Needs assistance/impaired Eating/Feeding: Set up;Sitting Eating/Feeding Details (indicate cue type and reason): typically requires assist or compensatory strategies to manage containers and openign items Grooming: Sitting;Independent   Upper Body Bathing: Sitting;Set up   Lower Body Bathing: Sit to/from stand;Set up   Upper Body Dressing : Set up   Lower Body Dressing: Sit to/from stand;Supervision/safety   Toilet Transfer: Supervision/safety;Regular Toilet;Grab bars   Toileting- Clothing Manipulation and Hygiene: Sit to/from stand;Supervision/safety   Tub/ Shower Transfer: Supervision/safety;Shower seat   Functional mobility during ADLs: Supervision/safety       Vision   Vision Assessment?: No apparent visual deficits     Perception     Praxis      Pertinent Vitals/Pain Pain  Assessment Pain Assessment: Faces Faces Pain Scale: Hurts a little bit Pain Location: L wrist due to neuropathy     Hand Dominance     Extremity/Trunk Assessment Upper Extremity Assessment Upper Extremity Assessment: LUE deficits/detail;RUE deficits/detail RUE Deficits / Details: WFL for ROM and strength RUE Sensation: history of peripheral neuropathy LUE Deficits / Details: patient  has neuropathy in LUE hand/wrist causing pain especially at night. 3/5 mmt shoulder LUE Sensation: history of peripheral neuropathy LUE Coordination: WNL   Lower Extremity Assessment Lower Extremity Assessment: Defer to PT evaluation   Cervical / Trunk Assessment Cervical / Trunk Assessment: Normal   Communication Communication Communication: No difficulties   Cognition Arousal/Alertness: Awake/alert Behavior During Therapy: WFL for tasks assessed/performed Overall Cognitive Status: Within Functional Limits for tasks assessed                                       General Comments       Exercises     Shoulder Instructions      Home Living Family/patient expects to be discharged to:: Private residence Living Arrangements: Children Available Help at Discharge: Family Type of Home: House Home Access: Stairs to enter   Entrance Stairs-Rails: Can reach both Home Layout: One level (patient has a basement area where she lives)     Civil engineer, contracting Shower/Tub: Hospital doctor Toilet: Handicapped height Bathroom Accessibility: Yes   Home Equipment: Rollator (4 wheels);Cane - single point;Grab bars - tub/shower          Prior Functioning/Environment Prior Level of Function : Independent/Modified Independent             Mobility Comments: patient uses rollater when in the home          OT Problem List: Pain      OT Treatment/Interventions:      OT Goals(Current goals can be found in the care plan section) Acute Rehab OT Goals Patient Stated Goal: i want to go home. OT Goal Formulation: All assessment and education complete, DC therapy  OT Frequency:      Co-evaluation              AM-PAC OT "6 Clicks" Daily Activity     Outcome Measure Help from another person eating meals?: A Little Help from another person taking care of personal grooming?: A Little Help from another person toileting, which includes using toliet, bedpan, or  urinal?: A Little Help from another person bathing (including washing, rinsing, drying)?: A Little Help from another person to put on and taking off regular upper body clothing?: A Little Help from another person to put on and taking off regular lower body clothing?: A Little 6 Click Score: 18   End of Session    Activity Tolerance: Patient tolerated treatment well Patient left: in chair;with call bell/phone within reach;with chair alarm set  OT Visit Diagnosis: Muscle weakness (generalized) (M62.81);Pain Pain - Right/Left: Left Pain - part of body: Hand                Time: 9470-9628 OT Time Calculation (min): 13 min Charges:  OT General Charges $OT Visit: 1 Visit OT Evaluation $OT Eval Low Complexity: [place], OTS Acute rehab services   Charlann Lange 08/17/2022, 10:00 AM

## 2022-08-17 NOTE — Progress Notes (Signed)
  Transition of Care [facility]) Screening Note   Patient Details  Name: [name] Date of Birth: [birth year]   Transition of Care [facility]) CM/SW Contact:    Vassie Moselle, LCSW Phone Number: 08/17/2022, 10:36 AM    Transition of Care Department [facility]) has reviewed patient and no TOC needs have been identified at this time. We will continue to monitor patient advancement through interdisciplinary progression rounds. If new patient transition needs arise, please place a TOC consult.

## 2022-08-17 NOTE — Discharge Summary (Signed)
Physician Discharge Summary   Patient: [name] MRN: [id number] DOB: 1930-02-26  Admit date:     08/15/2022  Discharge date: 08/17/22  Discharge Physician: Hosie Poisson   PCP: No primary care provider on file.   Recommendations at discharge:  Please follow up with PCp in one week.   Discharge Diagnoses: Principal Problem:   AKI (acute kidney injury) ([place]) Active Problems:   Essential hypertension   Hypotension due to hypovolemia    Hospital Course: [name] is a very pleasant 88 y.o. female with medical history significant for hypertension and cervical radiculopathy who presents to the emergency department for evaluation of hypotension. Patient reports a long history of lightheadedness on standing but this worsened significantly over the past 5 days or so with a couple episodes of near syncope.   Upon arrival to the ED, patient is found to be hypotensive with blood pressure as low as 80/58 with normal heart rate and no fever.  Chest x-ray is negative for acute cardiopulmonary disease and CT of the abdomen and pelvis is negative for acute findings.  Lactic acid and troponin were both normal x2.  Chemistry panel notable for BUN of 60 and creatinine 2.45.  Patient was given a liter of LR, Percocet, and transferred to [facility] for admission.    Assessment and Plan:  Hypotension probably from hypovolemia from dehydration.  BP parameters have improved when compared to admission with fluids.  Holding her home meds lisinopril and hydrochlorothiazide and lasix.  Hypotension resolved.      AKI;  On admission patient's creatinine is 2.45, improved to 1.75, unclear what her baseline creatinine is. Creatinine has improved to 1. 1  continue to hold LIsinopril , HCTZ, and lasix.          Cervical Radiculopathy:  Pain control and physical therapy.       Left wrist pain;  Continue with splint.  Pain control and physical therapy.       hypertension:  Start  patient on 2.5 mg norvasc instead of the lisinopril and hydrochlorothiazide.       Consultants: none.  Procedures performed: none.   Disposition: Home Diet recommendation:  Discharge Diet Orders (From admission, onward)     Start     Ordered   08/17/22 0000  Diet - low sodium heart healthy        08/17/22 0940           Regular diet DISCHARGE MEDICATION: Allergies as of 08/17/2022       Reactions   Epinephrine Other (See Comments)   Rapid heart beat   Procaine Rash, Other (See Comments)   Novocaine caused face blisters.        Medication List     STOP taking these medications    atorvastatin 10 MG tablet Commonly known as: LIPITOR   Contrave 8-90 MG Tb12 Generic drug: Naltrexone-buPROPion HCl ER   furosemide 20 MG tablet Commonly known as: LASIX   lisinopril-hydrochlorothiazide 20-25 MG tablet Commonly known as: ZESTORETIC   methocarbamol 500 MG tablet Commonly known as: ROBAXIN   oxyCODONE 5 MG immediate release tablet Commonly known as: Oxy IR/ROXICODONE   simvastatin 20 MG tablet Commonly known as: ZOCOR   traMADol 50 MG tablet Commonly known as: ULTRAM       TAKE these medications    acetaminophen 650 MG CR tablet Commonly known as: TYLENOL Take 1,300 mg by mouth daily as needed for pain.   amLODipine 2.5 MG tablet Commonly known  as: NORVASC Take 1 tablet (2.5 mg total) by mouth daily.   aspirin EC 81 MG tablet Take 81 mg by mouth daily.   docusate sodium 100 MG capsule Commonly known as: COLACE Take 100 mg by mouth daily.   dorzolamide-timolol 22.3-6.8 MG/ML ophthalmic solution Commonly known as: COSOPT 1 drop 2 (two) times daily.   estradiol 0.0375 mg/24hr patch Commonly known as: CLIMARA - Dosed in mg/24 hr Place 0.0375 mg onto the skin once a week.   gabapentin 400 MG capsule Commonly known as: NEURONTIN Take 400 mg by mouth 2 (two) times daily.   Lumigan 0.01 % Soln Generic drug: bimatoprost Place 1 drop into both  eyes at bedtime.   omeprazole 40 MG capsule Commonly known as: PRILOSEC Take 1 capsule (40 mg total) by mouth daily.   timolol 0.5 % ophthalmic solution Commonly known as: TIMOPTIC Place 1 drop into both eyes 2 (two) times daily.   traZODone 100 MG tablet Commonly known as: DESYREL Take 200 mg by mouth at bedtime.   Vitamin D (Ergocalciferol) 1.25 MG (50000 UNIT) Caps capsule Commonly known as: DRISDOL Take 50,000 Units by mouth once a week.        Discharge Exam: Filed Weights   08/15/22 1526 08/17/22 0512  Weight: 99.9 kg 103.4 kg   General exam: Appears calm and comfortable  Respiratory system: Clear to auscultation. Respiratory effort normal. Cardiovascular system: S1 & S2 heard, RRR. No JVD, murmurs, rubs, gallops or clicks. No pedal edema. Gastrointestinal system: Abdomen is nondistended, soft and nontender. No organomegaly or masses felt. Normal bowel sounds heard. Central nervous system: Alert and oriented. No focal neurological deficits. Extremities: Symmetric 5 x 5 power. Skin: No rashes, lesions or ulcers Psychiatry: Judgement and insight appear normal. Mood & affect appropriate.    Condition at discharge: fair  The results of significant diagnostics from this hospitalization (including imaging, microbiology, ancillary and laboratory) are listed below for reference.   Imaging Studies: CT ABDOMEN PELVIS WO CONTRAST  Result Date: 08/15/2022 CLINICAL DATA:  Nausea, vomiting, dizziness EXAM: CT ABDOMEN AND PELVIS WITHOUT CONTRAST TECHNIQUE: Multidetector CT imaging of the abdomen and pelvis was performed following the standard protocol without IV contrast. RADIATION DOSE REDUCTION: This exam was performed according to the departmental dose-optimization program which includes automated exposure control, adjustment of the mA and/or kV according to patient size and/or use of iterative reconstruction technique. COMPARISON:  10/27/2011 FINDINGS: Lower chest: Visualized  lower lung fields are unremarkable. Dense calcification is seen in mitral annulus. Hepatobiliary: There are few low-density foci in the liver with no significant interval change suggesting cysts or hemangiomas. There is no dilation of bile ducts. Surgical clips are seen in gallbladder fossa. Pancreas: No focal abnormalities are seen. Spleen: Unremarkable. Adrenals/Urinary Tract: Adrenals are not enlarged. There is no hydronephrosis. 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 There are no renal or ureteral stones. There are few smooth marginated low-density lesions in the kidneys largest measuring 5.9 cm in the midportion of left kidney suggesting renal cysts. No follow-up is recommended. Ureters are unremarkable. Urinary bladder is not distended. Stomach/Bowel: Stomach is unremarkable. Small bowel loops are not dilated. Appendix is not dilated. There is no significant wall thickening in colon. Scattered diverticula seen in colon without signs of focal acute diverticulitis. Vascular/Lymphatic: There are scattered arterial calcifications. Left renal vein is retroaortic. Reproductive: Uterus is not seen. Other: There is no ascites or pneumoperitoneum. Small umbilical hernia containing fat is seen. Musculoskeletal: There is 30% decrease in height of  upper endplate of body of L3 vertebra. Similar finding was seen on the previous study suggesting old compression. There is encroachment of neural foramina by bony spurs and bulging of annulus at multiple levels. There is previous arthroplasty in both hips. IMPRESSION: There is no evidence of intestinal obstruction or pneumoperitoneum. There is no hydronephrosis. Appendix is not dilated. Bilateral renal cysts. Few diverticula are seen in colon without signs of focal diverticulitis. Lumbar spondylosis. Other findings as described in the body of the report. Electronically Signed   By: Elmer Picker M.D.   On: 08/15/2022 17:48   DG Chest Port 1 View  Result Date:  08/15/2022 CLINICAL DATA:  Possible sepsis EXAM: PORTABLE CHEST 1 VIEW COMPARISON:  Apr 18, 2017 FINDINGS: Stable cardiomediastinal contours. Unchanged calcified aortic atherosclerosis. No focal pulmonary opacity. No large pleural effusion or pneumothorax. No acute osseous abnormality. The visualized upper abdomen is unremarkable. IMPRESSION: No acute cardiopulmonary abnormality. Electronically Signed   By: Beryle Flock M.D.   On: 08/15/2022 16:15    Microbiology: Results for orders placed or performed during the hospital encounter of 08/15/22  Blood Culture (routine x 2)     Status: None (Preliminary result)   Collection Time: 08/15/22  3:58 PM   Specimen: BLOOD  Result Value Ref Range Status   Specimen Description   Final    BLOOD BLOOD RIGHT HAND Performed at [facility], [address] [place], [place] [address]    Special Requests   Final    Blood Culture adequate volume BOTTLES DRAWN AEROBIC AND ANAEROBIC Performed at [facility], [address] [place], [place] [address]    Culture   Final    NO GROWTH 2 DAYS Performed at [facility], [place] [address], [place], [place] [address]    Report Status PENDING  Incomplete  Blood Culture (routine x 2)     Status: None (Preliminary result)   Collection Time: 08/15/22  4:03 PM   Specimen: BLOOD  Result Value Ref Range Status   Specimen Description   Final    BLOOD BLOOD RIGHT FOREARM Performed at [facility], [address] [place], [place] [address]    Special Requests   Final    BOTTLES DRAWN AEROBIC AND ANAEROBIC Blood Culture adequate volume Performed at [facility], [address] [place], [place] [address]    Culture   Final    NO GROWTH 2 DAYS Performed at [facility], [place] [address], [place], [place] [address]    Report Status PENDING  Incomplete  SARS Coronavirus 2 by RT PCR (hospital order, performed in [facility]  hospital lab) *cepheid single result test* Anterior Nasal Swab     Status: None   Collection Time: 08/16/22  4:23 AM   Specimen: Anterior Nasal Swab  Result Value Ref Range Status   SARS Coronavirus 2 by RT PCR NEGATIVE NEGATIVE Final    Comment: (NOTE) SARS-CoV-2 target nucleic acids are NOT DETECTED.  The SARS-CoV-2 RNA is generally detectable in upper and lower respiratory specimens during the acute phase of infection. The lowest concentration of SARS-CoV-2 viral copies this assay can detect is 250 copies / mL. A negative result does not preclude SARS-CoV-2 infection and should not be used as the sole basis for treatment or other patient management decisions.  A negative result may occur with improper specimen collection / handling, submission of specimen other than nasopharyngeal swab, presence of viral mutation(s) within the areas targeted by this assay,  and inadequate number of viral copies (<250 copies / mL). A negative result must be combined with clinical observations, patient history, and epidemiological information.  Fact Sheet for Patients:   https://www.patel.info/  Fact Sheet for Healthcare Providers: https://hall.com/  This test is not yet approved or  cleared by the [place] FDA and has been authorized for detection and/or diagnosis of SARS-CoV-2 by FDA under an Emergency Use Authorization (EUA).  This EUA will remain in effect (meaning this test can be used) for the duration of the COVID-19 declaration under Section 564(b)(1) of the Act, 21 U.S.C. section 360bbb-3(b)(1), unless the authorization is terminated or revoked sooner.  Performed at [facility], [place] [address] [place], [place] [address]     Labs: CBC: Recent Labs  Lab 08/15/22 1538 08/16/22 0547  WBC 12.9* 7.4  HGB 11.7* 10.6*  HCT 34.1* 32.8*  MCV 90.2 96.2  PLT 228 779   Basic Metabolic Panel: Recent Labs  Lab  08/15/22 1538 08/15/22 1558 08/16/22 0547 08/17/22 0536  NA 137  --  141 139  K 4.3  --  3.7 4.0  CL 102  --  110 107  CO2 22  --  23 25  GLUCOSE 125*  --  107* 110*  BUN 60*  --  51* 29*  CREATININE 2.45*  --  1.75* 1.17*  CALCIUM 9.0  --  8.7* 9.2  MG  --  1.7 1.8  --   PHOS  --   --  3.3  --    Liver Function Tests: Recent Labs  Lab 08/15/22 1558  AST 16  ALT 12  ALKPHOS 28*  BILITOT 0.5  PROT 5.9*  ALBUMIN 4.0   CBG: Recent Labs  Lab 08/15/22 1536  GLUCAP 114*    Discharge time spent: 38 minutes.   Signed: Hosie Poisson, MD [facility] 08/17/2022

## 2022-08-17 NOTE — Progress Notes (Signed)
Chaplain engaged in an initial visit with Naiomy who was preparing herself for discharge.  Larrie shared about her recent health challenges and how scary they have been, while also acknowledging her faith in trust in [place].  Hend credits her long life to following Jesus.    Jakeria is the mother of four son and has multiple grandsons, and one granddaughter.    Chaplain offered prayer over [place], offered support, listening, and presence.    08/17/22 1000  Clinical Encounter Type  Visited With Patient  Visit Type Initial;Spiritual support  Referral From Patient  Consult/Referral To Chaplain  Spiritual Encounters  Spiritual Needs Prayer  Stress Factors  Patient Stress Factors Health changes

## 2022-08-20 LAB — CULTURE, BLOOD (ROUTINE X 2)
Culture: NO GROWTH
Culture: NO GROWTH
Special Requests: ADEQUATE
Special Requests: ADEQUATE

## 2022-12-12 ENCOUNTER — Ambulatory Visit (INDEPENDENT_AMBULATORY_CARE_PROVIDER_SITE_OTHER): Payer: Medicare Other | Admitting: Podiatry

## 2022-12-12 ENCOUNTER — Encounter: Payer: Self-pay | Admitting: Podiatry

## 2022-12-12 VITALS — BP 146/88

## 2022-12-12 DIAGNOSIS — G609 Hereditary and idiopathic neuropathy, unspecified: Secondary | ICD-10-CM

## 2022-12-12 MED ORDER — PREGABALIN 150 MG PO CAPS: 150.0000 mg | ORAL_CAPSULE | Freq: Two times a day (BID) | ORAL | 0 refills | Status: DC

## 2022-12-12 NOTE — Progress Notes (Signed)
  Subjective:  Patient ID: [name], female    DOB: Aug 14, 1930,   MRN: [id number]  Chief Complaint  Patient presents with   Foot Pain    Per pt she has severe neuropathy , and it is getting worse everyday and it is getting hard for her to walk     89 y.o. female presents for concern of severe neuropathy. Relates she has been dealing with this for about 10-12 years. Relates she has a history of back issues that caused the problem and has a lumbar fusion. Also relates burning in her hands. States she has been on gabapentin with no relief. States in the past lyrica has helped but not been on recently. Patient relates pain is affecting her gait and is concerned for falls.    . Denies any other pedal complaints. Denies n/v/f/c.   Past Medical History:  Diagnosis Date   Anemia    Arthritis    Complication of anesthesia    "I GOT PNEUMONIA THE NEXT DAY FROM THE ANESTHESIA"   Depression    Fibromyalgia    GERD (gastroesophageal reflux disease)    History of skin cancer    HLD (hyperlipidemia)    HTN (hypertension)    Internal hemorrhoids    Osteoarthritis    Peripheral neuropathy    Shortness of breath    Skin cancer    SUI (stress urinary incontinence, female)    Ulcerative colitis     Objective:  Physical Exam: Vascular: DP/PT pulses 2/4 bilateral. CFT <3 seconds. Normal hair growth on digits. No edema.  Skin. No lacerations or abrasions bilateral feet.  Musculoskeletal: MMT 5/5 bilateral lower extremities in DF, PF, Inversion and Eversion. Deceased ROM in DF of ankle joint. Sensitive to light touch but no specific areas that are more tender to palpation.  Neurological: Sensation intact to light touch. Protective sensation intact bilateral.   Assessment:   1. Hereditary and idiopathic peripheral neuropathy      Plan:  Patient was evaluated and treated and all questions answered. Discussed neuropathy and etiology as well as treatment with patient.  Radiographs reviewed  and discussed with patient.  -Discussed and educated patient on diabetic foot care, especially with  regards to the vascular, neurological and musculoskeletal systems.  -Stressed the importance of good glycemic control and the detriment of not  controlling glucose levels in relation to the foot. -Discussed supportive shoes at all times and checking feet regularly.  -Lyrica provided.  -Discussed if lyrica does not help next steps would be to follow-up with back specialist or neurology for possible treatment options.  -Patient to return in 3 months for follow-up evaluation.    Lorenda Peck, DPM

## 2023-03-13 ENCOUNTER — Ambulatory Visit: Payer: Medicare Other | Admitting: Podiatry

## 2024-01-16 ENCOUNTER — Other Ambulatory Visit: Payer: Self-pay

## 2024-01-16 ENCOUNTER — Encounter: Payer: Self-pay | Admitting: Physical Therapy

## 2024-01-16 ENCOUNTER — Ambulatory Visit: Payer: Medicare Other | Attending: Family Medicine | Admitting: Physical Therapy

## 2024-01-16 DIAGNOSIS — R278 Other lack of coordination: Secondary | ICD-10-CM | POA: Diagnosis present

## 2024-01-16 DIAGNOSIS — R262 Difficulty in walking, not elsewhere classified: Secondary | ICD-10-CM | POA: Diagnosis present

## 2024-01-16 DIAGNOSIS — M6281 Muscle weakness (generalized): Secondary | ICD-10-CM

## 2024-01-16 DIAGNOSIS — M5459 Other low back pain: Secondary | ICD-10-CM
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
## 2024-01-23 ENCOUNTER — Ambulatory Visit: Payer: Medicare Other | Admitting: Physical Therapy

## 2024-01-23 ENCOUNTER — Encounter: Payer: Self-pay | Admitting: Physical Therapy

## 2024-01-23 DIAGNOSIS — M6281 Muscle weakness (generalized): Secondary | ICD-10-CM | POA: Diagnosis not present

## 2024-01-23 DIAGNOSIS — R262 Difficulty in walking, not elsewhere classified: Secondary | ICD-10-CM

## 2024-01-23 DIAGNOSIS — R278 Other lack of coordination: Secondary | ICD-10-CM

## 2024-01-23 DIAGNOSIS — M5459 Other low back pain: Secondary | ICD-10-CM
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
## 2024-01-30 ENCOUNTER — Encounter: Payer: Medicare Other | Admitting: Rehabilitative and Restorative Service Providers"

## 2024-02-06 ENCOUNTER — Encounter: Payer: Medicare Other | Admitting: Physical Therapy

## 2024-02-13 ENCOUNTER — Ambulatory Visit: Payer: Medicare Other | Admitting: Rehabilitative and Restorative Service Providers"

## 2024-02-20 ENCOUNTER — Encounter: Payer: Medicare Other | Admitting: Rehabilitative and Restorative Service Providers"

## 2024-02-27 ENCOUNTER — Encounter: Payer: Medicare Other | Admitting: Rehabilitative and Restorative Service Providers"

## 2024-03-05 ENCOUNTER — Encounter: Payer: Medicare Other | Admitting: Rehabilitative and Restorative Service Providers"

## 2024-03-12 ENCOUNTER — Encounter: Payer: Medicare Other | Admitting: Physical Therapy

## 2024-04-03 ENCOUNTER — Emergency Department (HOSPITAL_COMMUNITY)

## 2024-04-03 ENCOUNTER — Encounter (HOSPITAL_COMMUNITY): Payer: Self-pay

## 2024-04-03 ENCOUNTER — Inpatient Hospital Stay (HOSPITAL_COMMUNITY)
Admission: EM | Admit: 2024-04-03 | Discharge: 2024-04-09 | DRG: 481 | Disposition: A | Discharge status: Rehabilitation Facility | Attending: Internal Medicine | Admitting: Internal Medicine

## 2024-04-03 ENCOUNTER — Other Ambulatory Visit: Payer: Self-pay

## 2024-04-03 DIAGNOSIS — D62 Acute posthemorrhagic anemia: Secondary | ICD-10-CM | POA: Diagnosis not present

## 2024-04-03 DIAGNOSIS — G629 Polyneuropathy, unspecified: Secondary | ICD-10-CM | POA: Diagnosis present

## 2024-04-03 DIAGNOSIS — Z7901 Long term (current) use of anticoagulants: Secondary | ICD-10-CM

## 2024-04-03 DIAGNOSIS — Z96649 Presence of unspecified artificial hip joint: Secondary | ICD-10-CM | POA: Diagnosis not present

## 2024-04-03 DIAGNOSIS — M898X9 Other specified disorders of bone, unspecified site: Secondary | ICD-10-CM | POA: Diagnosis present

## 2024-04-03 DIAGNOSIS — I10 Essential (primary) hypertension: Secondary | ICD-10-CM | POA: Diagnosis present

## 2024-04-03 DIAGNOSIS — Y92092 Bedroom in other non-institutional residence as the place of occurrence of the external cause: Secondary | ICD-10-CM | POA: Diagnosis not present

## 2024-04-03 DIAGNOSIS — M797 Fibromyalgia: Secondary | ICD-10-CM | POA: Diagnosis present

## 2024-04-03 DIAGNOSIS — Z8701 Personal history of pneumonia (recurrent): Secondary | ICD-10-CM

## 2024-04-03 DIAGNOSIS — R739 Hyperglycemia, unspecified: Secondary | ICD-10-CM | POA: Diagnosis present

## 2024-04-03 DIAGNOSIS — Z6830 Body mass index (BMI) 30.0-30.9, adult: Secondary | ICD-10-CM | POA: Diagnosis not present

## 2024-04-03 DIAGNOSIS — N393 Stress incontinence (female) (male): Secondary | ICD-10-CM | POA: Diagnosis present

## 2024-04-03 DIAGNOSIS — Z96643 Presence of artificial hip joint, bilateral: Secondary | ICD-10-CM | POA: Diagnosis present

## 2024-04-03 DIAGNOSIS — R350 Frequency of micturition: Secondary | ICD-10-CM | POA: Diagnosis not present

## 2024-04-03 DIAGNOSIS — S72451A Displaced supracondylar fracture without intracondylar extension of lower end of right femur, initial encounter for closed fracture: Secondary | ICD-10-CM | POA: Diagnosis present

## 2024-04-03 DIAGNOSIS — K5900 Constipation, unspecified: Secondary | ICD-10-CM | POA: Diagnosis not present

## 2024-04-03 DIAGNOSIS — I700 Atherosclerosis of aorta: Secondary | ICD-10-CM | POA: Diagnosis present

## 2024-04-03 DIAGNOSIS — M978XXD Periprosthetic fracture around other internal prosthetic joint, subsequent encounter: Secondary | ICD-10-CM | POA: Diagnosis not present

## 2024-04-03 DIAGNOSIS — Z85828 Personal history of other malignant neoplasm of skin: Secondary | ICD-10-CM | POA: Diagnosis not present

## 2024-04-03 DIAGNOSIS — M9711XA Periprosthetic fracture around internal prosthetic right knee joint, initial encounter: Secondary | ICD-10-CM | POA: Diagnosis present

## 2024-04-03 DIAGNOSIS — E669 Obesity, unspecified: Secondary | ICD-10-CM | POA: Diagnosis present

## 2024-04-03 DIAGNOSIS — E785 Hyperlipidemia, unspecified: Secondary | ICD-10-CM | POA: Diagnosis present

## 2024-04-03 DIAGNOSIS — W06XXXA Fall from bed, initial encounter: Secondary | ICD-10-CM | POA: Diagnosis present

## 2024-04-03 DIAGNOSIS — G8929 Other chronic pain: Secondary | ICD-10-CM | POA: Diagnosis not present

## 2024-04-03 DIAGNOSIS — K5909 Other constipation: Secondary | ICD-10-CM | POA: Diagnosis present

## 2024-04-03 DIAGNOSIS — Z884 Allergy status to anesthetic agent status: Secondary | ICD-10-CM

## 2024-04-03 DIAGNOSIS — S72461A Displaced supracondylar fracture with intracondylar extension of lower end of right femur, initial encounter for closed fracture: Secondary | ICD-10-CM | POA: Diagnosis not present

## 2024-04-03 DIAGNOSIS — F32A Depression, unspecified: Secondary | ICD-10-CM | POA: Diagnosis present

## 2024-04-03 DIAGNOSIS — D649 Anemia, unspecified: Secondary | ICD-10-CM | POA: Diagnosis present

## 2024-04-03 DIAGNOSIS — H409 Unspecified glaucoma: Secondary | ICD-10-CM | POA: Diagnosis present

## 2024-04-03 DIAGNOSIS — S72401A Unspecified fracture of lower end of right femur, initial encounter for closed fracture: Principal | ICD-10-CM

## 2024-04-03 DIAGNOSIS — Z888 Allergy status to other drugs, medicaments and biological substances status: Secondary | ICD-10-CM

## 2024-04-03 DIAGNOSIS — Z90710 Acquired absence of both cervix and uterus: Secondary | ICD-10-CM

## 2024-04-03 DIAGNOSIS — R0902 Hypoxemia: Secondary | ICD-10-CM | POA: Diagnosis not present

## 2024-04-03 DIAGNOSIS — Z98891 History of uterine scar from previous surgery: Secondary | ICD-10-CM

## 2024-04-03 DIAGNOSIS — M1990 Unspecified osteoarthritis, unspecified site: Secondary | ICD-10-CM | POA: Diagnosis present

## 2024-04-03 DIAGNOSIS — I4891 Unspecified atrial fibrillation: Secondary | ICD-10-CM | POA: Diagnosis present

## 2024-04-03 DIAGNOSIS — Z6837 Body mass index (BMI) 37.0-37.9, adult: Secondary | ICD-10-CM | POA: Diagnosis not present

## 2024-04-03 DIAGNOSIS — E876 Hypokalemia: Secondary | ICD-10-CM | POA: Diagnosis not present

## 2024-04-03 DIAGNOSIS — I480 Paroxysmal atrial fibrillation: Secondary | ICD-10-CM | POA: Diagnosis present

## 2024-04-03 DIAGNOSIS — K219 Gastro-esophageal reflux disease without esophagitis: Secondary | ICD-10-CM | POA: Diagnosis present

## 2024-04-03 DIAGNOSIS — Z9049 Acquired absence of other specified parts of digestive tract: Secondary | ICD-10-CM

## 2024-04-03 DIAGNOSIS — N179 Acute kidney failure, unspecified: Secondary | ICD-10-CM | POA: Diagnosis not present

## 2024-04-03 DIAGNOSIS — D72829 Elevated white blood cell count, unspecified: Secondary | ICD-10-CM | POA: Diagnosis present

## 2024-04-03 DIAGNOSIS — Z79899 Other long term (current) drug therapy: Secondary | ICD-10-CM | POA: Diagnosis not present

## 2024-04-03 DIAGNOSIS — Z833 Family history of diabetes mellitus: Secondary | ICD-10-CM

## 2024-04-03 DIAGNOSIS — M9701XA Periprosthetic fracture around internal prosthetic right hip joint, initial encounter: Secondary | ICD-10-CM | POA: Diagnosis present

## 2024-04-03 DIAGNOSIS — M25551 Pain in right hip: Secondary | ICD-10-CM | POA: Diagnosis not present

## 2024-04-03 DIAGNOSIS — S72451D Displaced supracondylar fracture without intracondylar extension of lower end of right femur, subsequent encounter for closed fracture with routine healing: Secondary | ICD-10-CM | POA: Diagnosis not present

## 2024-04-03 DIAGNOSIS — Z9181 History of falling: Secondary | ICD-10-CM

## 2024-04-03 DIAGNOSIS — M545 Low back pain, unspecified: Secondary | ICD-10-CM | POA: Diagnosis not present

## 2024-04-03 DIAGNOSIS — K5190 Ulcerative colitis, unspecified, without complications: Secondary | ICD-10-CM | POA: Diagnosis present

## 2024-04-03 DIAGNOSIS — Z9889 Other specified postprocedural states: Secondary | ICD-10-CM

## 2024-04-03 DIAGNOSIS — S72331A Displaced oblique fracture of shaft of right femur, initial encounter for closed fracture: Secondary | ICD-10-CM | POA: Diagnosis not present

## 2024-04-03 DIAGNOSIS — Z8711 Personal history of peptic ulcer disease: Secondary | ICD-10-CM

## 2024-04-03 DIAGNOSIS — Z96653 Presence of artificial knee joint, bilateral: Secondary | ICD-10-CM | POA: Diagnosis present

## 2024-04-03 DIAGNOSIS — K648 Other hemorrhoids: Secondary | ICD-10-CM | POA: Diagnosis present

## 2024-04-03 DIAGNOSIS — I959 Hypotension, unspecified: Secondary | ICD-10-CM | POA: Diagnosis not present

## 2024-04-03 DIAGNOSIS — Z7982 Long term (current) use of aspirin: Secondary | ICD-10-CM

## 2024-04-03 DIAGNOSIS — S7291XD Unspecified fracture of right femur, subsequent encounter for closed fracture with routine healing: Secondary | ICD-10-CM | POA: Diagnosis not present

## 2024-04-03 DIAGNOSIS — W06XXXD Fall from bed, subsequent encounter: Secondary | ICD-10-CM | POA: Diagnosis present

## 2024-04-03 DIAGNOSIS — M9701XD Periprosthetic fracture around internal prosthetic right hip joint, subsequent encounter: Secondary | ICD-10-CM | POA: Diagnosis not present

## 2024-04-03 DIAGNOSIS — Z800 Family history of malignant neoplasm of digestive organs: Secondary | ICD-10-CM

## 2024-04-03 DIAGNOSIS — J9811 Atelectasis: Secondary | ICD-10-CM | POA: Diagnosis not present

## 2024-04-03 DIAGNOSIS — M79604 Pain in right leg: Secondary | ICD-10-CM | POA: Diagnosis not present

## 2024-04-03 DIAGNOSIS — G4700 Insomnia, unspecified: Secondary | ICD-10-CM | POA: Diagnosis present

## 2024-04-03 DIAGNOSIS — K5901 Slow transit constipation: Secondary | ICD-10-CM | POA: Diagnosis not present

## 2024-04-03 DIAGNOSIS — R609 Edema, unspecified: Secondary | ICD-10-CM | POA: Diagnosis not present

## 2024-04-03 LAB — ECHOCARDIOGRAM COMPLETE
AR max vel: 2.34 cm2
AV Peak grad: 8.8 mmHg
Ao pk vel: 1.48 m/s
Area-P 1/2: 3.1 cm2
Height: 70 in
S' Lateral: 2.2 cm
Weight: 3440 [oz_av]

## 2024-04-03 LAB — BASIC METABOLIC PANEL WITH GFR
Anion gap: 10 (ref 5–15)
BUN: 23 mg/dL (ref 8–23)
CO2: 25 mmol/L (ref 22–32)
Calcium: 9.1 mg/dL (ref 8.9–10.3)
Chloride: 103 mmol/L (ref 98–111)
Creatinine, Ser: 0.95 mg/dL (ref 0.44–1.00)
GFR, Estimated: 57 mL/min — ABNORMAL LOW (ref >60)
Glucose, Bld: 109 mg/dL — ABNORMAL HIGH (ref 70–99)
Potassium: 3.8 mmol/L (ref 3.5–5.1)
Sodium: 138 mmol/L (ref 135–145)

## 2024-04-03 LAB — CBC
HCT: 38.3 % (ref 36.0–46.0)
Hemoglobin: 11.9 g/dL — ABNORMAL LOW (ref 12.0–15.0)
MCH: 29.2 pg (ref 26.0–34.0)
MCHC: 31.1 g/dL (ref 30.0–36.0)
MCV: 94.1 fL (ref 80.0–100.0)
Platelets: 212 10*3/uL (ref 150–400)
RBC: 4.07 MIL/uL (ref 3.87–5.11)
RDW: 14 % (ref 11.5–15.5)
WBC: 11.1 10*3/uL — ABNORMAL HIGH (ref 4.0–10.5)
nRBC: 0 % (ref 0.0–0.2)

## 2024-04-03 LAB — MAGNESIUM: Magnesium: 2 mg/dL (ref 1.7–2.4)

## 2024-04-03 LAB — PHOSPHORUS: Phosphorus: 3.9 mg/dL (ref 2.5–4.6)

## 2024-04-03 LAB — TROPONIN I (HIGH SENSITIVITY): Troponin I (High Sensitivity): 5 ng/L (ref <18)

## 2024-04-03 MED ORDER — CHLORHEXIDINE GLUCONATE 4 % EX SOLN
60.0000 mL | Freq: Once | CUTANEOUS | Status: AC
  Administered 2024-04-04: 4 via TOPICAL

## 2024-04-03 MED ORDER — MAGNESIUM SULFATE 2 GM/50ML IV SOLN
2.0000 g | Freq: Once | INTRAVENOUS | Status: AC
  Administered 2024-04-03: 2 g via INTRAVENOUS
  Filled 2024-04-03: qty 50

## 2024-04-03 MED ORDER — MORPHINE SULFATE (PF) 4 MG/ML IV SOLN
4.0000 mg | INTRAVENOUS | Status: AC | PRN
  Administered 2024-04-03 – 2024-04-06 (×5): 4 mg via INTRAVENOUS
  Filled 2024-04-03 (×5): qty 1

## 2024-04-03 MED ORDER — SERTRALINE HCL 50 MG PO TABS
50.0000 mg | ORAL_TABLET | Freq: Every day | ORAL | Status: DC
  Administered 2024-04-03 – 2024-04-09 (×7): 50 mg via ORAL
  Filled 2024-04-03 (×7): qty 1

## 2024-04-03 MED ORDER — DILTIAZEM HCL-DEXTROSE 125-5 MG/125ML-% IV SOLN (PREMIX)
5.0000 mg/h | INTRAVENOUS | Status: DC
  Administered 2024-04-03: 5 mg/h via INTRAVENOUS
  Filled 2024-04-03: qty 125

## 2024-04-03 MED ORDER — MORPHINE SULFATE (PF) 4 MG/ML IV SOLN
4.0000 mg | Freq: Once | INTRAVENOUS | Status: AC
  Administered 2024-04-03: 4 mg via INTRAVENOUS
  Filled 2024-04-03: qty 1

## 2024-04-03 MED ORDER — TRAZODONE HCL 50 MG PO TABS
150.0000 mg | ORAL_TABLET | Freq: Every day | ORAL | Status: DC
  Administered 2024-04-04 – 2024-04-08 (×5): 150 mg via ORAL
  Filled 2024-04-03 (×5): qty 1

## 2024-04-03 MED ORDER — OXYCODONE HCL 5 MG PO TABS
5.0000 mg | ORAL_TABLET | Freq: Four times a day (QID) | ORAL | Status: AC | PRN
  Administered 2024-04-03 – 2024-04-04 (×3): 5 mg via ORAL
  Filled 2024-04-03 (×4): qty 1

## 2024-04-03 MED ORDER — ACETAMINOPHEN 650 MG RE SUPP: 650.0000 mg | Freq: Four times a day (QID) | RECTAL | Status: DC | PRN

## 2024-04-03 MED ORDER — DILTIAZEM HCL ER COATED BEADS 120 MG PO CP24
120.0000 mg | ORAL_CAPSULE | Freq: Every day | ORAL | Status: DC
  Administered 2024-04-03 – 2024-04-06 (×3): 120 mg via ORAL
  Filled 2024-04-03 (×5): qty 1

## 2024-04-03 MED ORDER — ONDANSETRON HCL 4 MG/2ML IJ SOLN
4.0000 mg | Freq: Once | INTRAMUSCULAR | Status: AC
  Administered 2024-04-03: 4 mg via INTRAVENOUS
  Filled 2024-04-03: qty 2

## 2024-04-03 MED ORDER — ACETAMINOPHEN 325 MG PO TABS: 650.0000 mg | ORAL_TABLET | Freq: Four times a day (QID) | ORAL | Status: DC | PRN

## 2024-04-03 MED ORDER — TRAZODONE HCL 100 MG PO TABS
200.0000 mg | ORAL_TABLET | Freq: Every day | ORAL | Status: DC
Start: 2024-04-03 — End: 2024-04-03

## 2024-04-03 MED ORDER — GABAPENTIN 400 MG PO CAPS
400.0000 mg | ORAL_CAPSULE | Freq: Every day | ORAL | Status: DC
  Administered 2024-04-03 – 2024-04-08 (×6): 400 mg via ORAL
  Filled 2024-04-03 (×6): qty 1

## 2024-04-03 MED ORDER — DOCUSATE SODIUM 100 MG PO CAPS
100.0000 mg | ORAL_CAPSULE | Freq: Every day | ORAL | Status: DC
  Administered 2024-04-03 – 2024-04-09 (×7): 100 mg via ORAL
  Filled 2024-04-03 (×7): qty 1

## 2024-04-03 MED ORDER — LATANOPROST 0.005 % OP SOLN
1.0000 [drp] | Freq: Every day | OPHTHALMIC | Status: DC
  Administered 2024-04-03 – 2024-04-08 (×4): 1 [drp] via OPHTHALMIC
  Filled 2024-04-03: qty 2.5

## 2024-04-03 MED ORDER — DILTIAZEM HCL 30 MG PO TABS
30.0000 mg | ORAL_TABLET | Freq: Once | ORAL | Status: AC
  Administered 2024-04-03: 30 mg via ORAL
  Filled 2024-04-03: qty 1

## 2024-04-03 MED ORDER — SODIUM CHLORIDE 0.9 % IV BOLUS
500.0000 mL | Freq: Once | INTRAVENOUS | Status: AC
  Administered 2024-04-03: 500 mL via INTRAVENOUS

## 2024-04-03 MED ORDER — PANTOPRAZOLE SODIUM 40 MG PO TBEC
40.0000 mg | DELAYED_RELEASE_TABLET | Freq: Every day | ORAL | Status: DC
  Administered 2024-04-03 – 2024-04-09 (×7): 40 mg via ORAL
  Filled 2024-04-03 (×7): qty 1

## 2024-04-03 MED ORDER — DILTIAZEM LOAD VIA INFUSION
15.0000 mg | Freq: Once | INTRAVENOUS | Status: AC
  Administered 2024-04-03: 15 mg via INTRAVENOUS
  Filled 2024-04-03: qty 15

## 2024-04-03 MED ORDER — ONDANSETRON HCL 4 MG PO TABS: 4.0000 mg | ORAL_TABLET | Freq: Four times a day (QID) | ORAL | Status: DC | PRN

## 2024-04-03 MED ORDER — DORZOLAMIDE HCL-TIMOLOL MAL 2-0.5 % OP SOLN
1.0000 [drp] | Freq: Two times a day (BID) | OPHTHALMIC | Status: DC
  Administered 2024-04-03 – 2024-04-09 (×8): 1 [drp] via OPHTHALMIC
  Filled 2024-04-03: qty 10

## 2024-04-03 MED ORDER — POVIDONE-IODINE 10 % EX SWAB
2.0000 | Freq: Once | CUTANEOUS | Status: AC
  Administered 2024-04-04: 2 via TOPICAL

## 2024-04-03 MED ORDER — METOPROLOL TARTRATE 5 MG/5ML IV SOLN: 2.5000 mg | INTRAVENOUS | Status: DC | PRN

## 2024-04-03 MED ORDER — CEFAZOLIN SODIUM-DEXTROSE 2-4 GM/100ML-% IV SOLN
2.0000 g | INTRAVENOUS | Status: AC
  Administered 2024-04-04: 2 g via INTRAVENOUS
  Filled 2024-04-03 (×2): qty 100

## 2024-04-03 MED ORDER — ASPIRIN 81 MG PO TBEC
81.0000 mg | DELAYED_RELEASE_TABLET | Freq: Every day | ORAL | Status: DC
  Administered 2024-04-05 – 2024-04-06 (×2): 81 mg via ORAL
  Filled 2024-04-03 (×3): qty 1

## 2024-04-03 MED ORDER — ONDANSETRON HCL 4 MG/2ML IJ SOLN: 4.0000 mg | Freq: Four times a day (QID) | INTRAMUSCULAR | Status: DC | PRN

## 2024-04-03 MED ORDER — POTASSIUM CHLORIDE CRYS ER 20 MEQ PO TBCR
20.0000 meq | EXTENDED_RELEASE_TABLET | Freq: Once | ORAL | Status: AC
  Administered 2024-04-03: 20 meq via ORAL
  Filled 2024-04-03: qty 1

## 2024-04-03 NOTE — Progress Notes (Signed)
 Aware that patient's transfer is complete to [facility]. Scheduled for surgical repair of right periprosthetic femur at 0800 tomorrow. Formal consultation at that time. NPO p MN.  Hardy Lia, MD [facility], [facility] [phone]

## 2024-04-03 NOTE — H&P (Signed)
 History and Physical    Patient: [name] FAO:[id number] DOB: 29-Mar-1930 DOA: 04/03/2024 DOS: the patient was seen and examined on 04/03/2024 PCP: Pcp, No  Patient coming from: Home  Chief Complaint:  Chief Complaint  Patient presents with   Leg Injury   HPI: [name] is a 89 y.o. female with medical history significant of normocytic anemia, osteoarthritis, depression, fibromyalgia, GERD, skin cancer, hyperlipidemia, hypertension, internal hemorrhoids, osteoarthritis, peripheral neuropathy, skin cancer, stress urinary incontinence, ulcerative colitis who presented to the emergency department with a history of having a fall while trying to get out of bed to reach for her phone, injuring her right lower extremity, which has become very painful with decreased ROM and shortened.  She stated that this was an accident.  However, after the patient arrived to the emergency department she went into atrial fibrillation with RVR.  She denied any prodromal symptoms with her fall, but has stated that for the last few years she has been getting palpitations on and off for about a month. He denied fever, chills, rhinorrhea, sore throat, wheezing or hemoptysis.  No chest pain, diaphoresis, PND, orthopnea or pitting edema of the lower extremities.  No abdominal pain, nausea, emesis, diarrhea, constipation, melena or hematochezia.  No flank pain, dysuria, frequency or hematuria.  No polyuria, polydipsia, polyphagia or blurred vision.   Lab work: CBC showed white count 11.1, hemoglobin 11.9 g/dL platelets 696.  BMP showed a glucose of 109 mg/dL, but was otherwise normal.  Magnesium  is 2.0 and phosphorus 3.9 mg/deciliter.  Troponin was normal.  Imaging: Portable 1 view chest radiograph with no acute abnormality and normal heart size.  Tortuous and partially calcified thoracic aorta.  Right hip/pelvis/right knee x-rays are showing oblique fracture of the distal femoral metaphysis just above the femoral  component of the total knee arthroplasty.  The right hip and knee arthroplasty are intact.  ED course: Initial vital signs were temperature 98.1 F, pulse 69, respiration 18, BP 149/125 mmHg O2 sat 100% on room air.  The patient received diltiazem 15 mg IVP followed by continuous diltiazem infusion.  Morphine  4 mg IVP, ondansetron  4 mg IVP, potassium 20 mEq p.o. x 1 and magnesium  sulfate 2 g IVPB.   Review of Systems: As mentioned in the history of present illness. All other systems reviewed and are negative. Past Medical History:  Diagnosis Date   Anemia    Arthritis    Complication of anesthesia    "I GOT PNEUMONIA THE NEXT DAY FROM THE ANESTHESIA"   Depression    Fibromyalgia    GERD (gastroesophageal reflux disease)    History of skin cancer    HLD (hyperlipidemia)    HTN (hypertension)    Internal hemorrhoids    Osteoarthritis    Peripheral neuropathy    Shortness of breath    Skin cancer    SUI (stress urinary incontinence, female)    Ulcerative colitis    Past Surgical History:  Procedure Laterality Date   ABDOMINAL HYSTERECTOMY     APPENDECTOMY     BREAST EXCISIONAL BIOPSY Left 08/10/2011    Dr Linell Rhymes   BREAST SURGERY     CARPAL TUNNEL RELEASE Left    CESAREAN SECTION     CHOLECYSTECTOMY     COLONOSCOPY  04/29/2003   internal hemorrhoids   JOINT REPLACEMENT Bilateral 2010 and 2012   KNEES   TOTAL HIP ARTHROPLASTY Right 08/06/2014   Procedure: RIGHT TOTAL HIP ARTHROPLASTY ANTERIOR APPROACH;  Surgeon: Aurther Blue, MD;  Location: [facility];  Service: Orthopedics;  Laterality: Right;   TOTAL HIP ARTHROPLASTY Left 12/17/2014   Procedure: LEFT TOTAL HIP ARTHROPLASTY ANTERIOR APPROACH;  Surgeon: Aurther Blue, MD;  Location: [facility];  Service: Orthopedics;  Laterality: Left;   Social History:  reports that she has never smoked. She has never used smokeless tobacco. She reports that she does not drink alcohol and does not use drugs.  Allergies  Allergen Reactions    Epinephrine  Other (See Comments)    Rapid heart beat   Procaine Rash and Other (See Comments)    Novocaine caused face blisters.    Family History  Problem Relation Age of Onset   Colon cancer Mother 1   Diabetes Brother     Prior to Admission medications   Medication Sig Start Date End Date Taking? Authorizing Provider  acetaminophen  (TYLENOL ) 650 MG CR tablet Take 1,300 mg by mouth daily as needed for pain.   Yes [provider]  aspirin  EC 81 MG tablet Take 81 mg by mouth daily.   Yes [provider]  bimatoprost (LUMIGAN) 0.01 % SOLN Place 1 drop into both eyes at bedtime.   Yes [provider]  docusate sodium  (COLACE) 100 MG capsule Take 100 mg by mouth daily.   Yes [provider]  dorzolamide -timolol  (COSOPT ) 22.3-6.8 MG/ML ophthalmic solution Place 1 drop into both eyes 2 (two) times daily. 08/03/22  Yes [provider]  estradiol  (CLIMARA  - DOSED IN MG/24 HR) 0.0375 mg/24hr patch Place 0.0375 mg onto the skin once a week. 07/10/22  Yes [provider]  gabapentin (NEURONTIN) 400 MG capsule Take 400 mg by mouth daily.   Yes [provider]  lisinopril-hydrochlorothiazide  (ZESTORETIC) 10-12.5 MG tablet Take 1 tablet by mouth daily.   Yes [provider]  omeprazole  (PRILOSEC) 40 MG capsule Take 1 capsule (40 mg total) by mouth daily. 08/17/22  Yes Akula, Vijaya, MD  sertraline  (ZOLOFT ) 50 MG tablet Take 50 mg by mouth daily.   Yes [provider]  traZODone  (DESYREL ) 100 MG tablet Take 200 mg by mouth at bedtime.    Yes [provider]  Vitamin D, Ergocalciferol, (DRISDOL) 1.25 MG (50000 UNIT) CAPS capsule Take 50,000 Units by mouth once a week. 07/04/22  Yes [provider]    Physical Exam: Vitals:   04/03/24 1354 04/03/24 1355 04/03/24 1404 04/03/24 1420  BP:   (!) 152/104 (!) 119/97  Pulse: (!) 144 (!) 141 (!) 119 (!) 128  Resp:   (!) 22 20  Temp:    98.2 F (36.8 C)   TempSrc:    Oral  SpO2:   92% 93%  Weight:      Height:       Physical Exam Vitals and nursing note reviewed.  Constitutional:      General: She is awake. She is not in acute distress.    Appearance: Normal appearance. She is ill-appearing.  HENT:     Head: Normocephalic.     Nose: No rhinorrhea.     Mouth/Throat:     Mouth: Mucous membranes are moist.  Eyes:     General: No scleral icterus.    Pupils: Pupils are equal, round, and reactive to light.  Neck:     Vascular: No JVD.  Cardiovascular:     Rate and Rhythm: Normal rate and regular rhythm.     Heart sounds: S1 normal and S2 normal.  Pulmonary:     Effort: Pulmonary effort is normal.  Breath sounds: No wheezing, rhonchi or rales.  Abdominal:     General: Bowel sounds are normal. There is no distension.     Palpations: Abdomen is soft.     Tenderness: There is no abdominal tenderness. There is no guarding.  Musculoskeletal:     Cervical back: Neck supple.     Right upper leg: Tenderness present.     Right lower leg: Tenderness present. No edema.     Left lower leg: No edema.     Comments: Decreased ROM and shortened RLE.  Skin:    General: Skin is warm and dry.  Neurological:     General: No focal deficit present.     Mental Status: She is alert and oriented to person, place, and time.  Psychiatric:        Mood and Affect: Mood normal.        Behavior: Behavior is cooperative.     Data Reviewed:  Results are pending, will review when available.  EKG: Vent. rate 146 BPM PR interval * ms QRS duration 89 ms QT/QTcB 305/476 ms P-R-T axes * -3 88 Atrial fibrillation with rapid V-rate Anterior infarct, old Repolarization abnormality, prob rate related  Assessment and Plan: Principal Problem:   Closed displaced oblique fracture of shaft of right femur (HCC) Admit to telemetry/inpatient. Ice area as needed. Buck's traction per protocol. Analgesics as needed. Antiemetics as needed. Consult TOC  team. Consult nutritional services. PT evaluation after surgery. Orthopedic surgery evaluation appreciated. - They have told EDP that surgery will likely be tomorrow morning. -N.p.o. after midnight.  Active Problems:   Atrial fibrillation with RVR (HCC)   New onset atrial fibrillation (HCC) Has already converted to NSR. Will transition diltiazem to oral form. Magnesium  and potassium were supplemented. Echocardiogram has been ordered. Cardiology consult requested.    Aortic atherosclerosis (HCC)    Hyperlipidemia Might benefit from a statin therapy.    Depression Continue sertraline  50 mg p.o. daily. Continue trazodone  at 150 mg p.o. nightly. - 150 mg recommended dose. - The patient is taking 200 mg nightly at home.    Essential hypertension Switching to Cardizem. Blood pressures have been soft. Call hydrochlorothiazide  and lisinopril.    GERD Continue pantoprazole  40 mg p.o. daily.    Normocytic anemia Monitor hematocrit and hemoglobin.    Hyperglycemia Check fasting blood glucose in AM.    Glaucoma Continue with dorzolamide /timolol  drops. Follow-up with ophthalmology as an outpatient.    Advance Care Planning:   Code Status: Full Code   Consults: Orthopedic surgery (Dr. Liliane Rei).  Family Communication: Her son was present in the ED room.  Severity of Illness: The appropriate patient status for this patient is INPATIENT. Inpatient status is judged to be reasonable and necessary in order to provide the required intensity of service to ensure the patient's safety. The patient's presenting symptoms, physical exam findings, and initial radiographic and laboratory data in the context of their chronic comorbidities is felt to place them at high risk for further clinical deterioration. Furthermore, it is not anticipated that the patient will be medically stable for discharge from the hospital within 2 midnights of admission.   * I certify that at the point of  admission it is my clinical judgment that the patient will require inpatient hospital care spanning beyond 2 midnights from the point of admission due to high intensity of service, high risk for further deterioration and high frequency of surveillance required.*  Author: Danice Dural, MD 04/03/2024 2:39 PM  For on call review [email or web address].   This document was prepared using Dragon voice recognition software and may contain some unintended transcription errors.

## 2024-04-03 NOTE — Anesthesia Preprocedure Evaluation (Addendum)
 Anesthesia Evaluation  Patient identified by MRN, date of birth, ID band Patient awake    Reviewed: Allergy & Precautions, NPO status , Patient's Chart, lab work & pertinent test results  History of Anesthesia Complications Negative for: history of anesthetic complications  Airway Mallampati: II  TM Distance: >3 FB Neck ROM: Full    Dental no notable dental hx.    Pulmonary neg pulmonary ROS   Pulmonary exam normal        Cardiovascular hypertension, Normal cardiovascular exam+ dysrhythmias Atrial Fibrillation   TTE 04/03/24: EF 60-65%, moderate LVH, moderate LAE, valves ok   Neuro/Psych    Depression    negative neurological ROS     GI/Hepatic Neg liver ROS, PUD,GERD  ,,UC   Endo/Other  negative endocrine ROS    Renal/GU negative Renal ROS  negative genitourinary   Musculoskeletal  (+) Arthritis ,  Fibromyalgia -  Abdominal   Peds  Hematology  (+) Blood dyscrasia, anemia   Anesthesia Other Findings Day of surgery medications reviewed with patient.  Reproductive/Obstetrics                              Anesthesia Physical Anesthesia Plan  ASA: 2  Anesthesia Plan: General   Post-op Pain Management: Tylenol  PO (pre-op)*   Induction: Intravenous  PONV Risk Score and Plan: 3 and Dexamethasone , Ondansetron  and Treatment may vary due to age or medical condition  Airway Management Planned: Oral ETT  Additional Equipment: None  Intra-op Plan:   Post-operative Plan: Extubation in OR  Informed Consent: I have reviewed the patients History and Physical, chart, labs and discussed the procedure including the risks, benefits and alternatives for the proposed anesthesia with the patient or authorized representative who has indicated his/her understanding and acceptance.     Dental advisory given  Plan Discussed with: CRNA  Anesthesia Plan Comments:         Anesthesia Quick  Evaluation

## 2024-04-03 NOTE — ED Notes (Signed)
 Pillow placed under right leg and socks out on the patient.

## 2024-04-03 NOTE — Progress Notes (Signed)
 Orthopedic Tech Progress Note Patient Details:  [name] 1930/06/17 [id number]  Musculoskeletal Traction Type of Traction: Bucks Skin Traction Traction Location: RLE Traction Weight: 5 lbs   Post Interventions Patient Tolerated: Well Instructions Provided: Care of device  Udell Gamble 04/03/2024, 5:34 PM

## 2024-04-03 NOTE — ED Notes (Signed)
 Patient started on oxygen at 2LPM per [place].

## 2024-04-03 NOTE — ED Notes (Signed)
 X-ray at bedside

## 2024-04-03 NOTE — ED Notes (Signed)
 [facility] called.

## 2024-04-03 NOTE — ED Notes (Signed)
Dr Ortiz at bedside 

## 2024-04-03 NOTE — ED Provider Notes (Addendum)
 [name] - [facility] Provider Note   CSN: [id number] Arrival date & time: 04/03/24  1004     History Chief Complaint  Patient presents with   Leg Injury    [name] is a 89 y.o. female with medical history of ulcerative colitis, peripheral neuropathy, hypertension, hyperlipidemia, GERD, fibromyalgia.  Patient presents to ED for evaluation of fall.  The patient reports that this morning she had a mechanical fall landing on right side.  She denies any preceding chest pain, shortness of breath.  She denies hitting her head, loss of consciousness.  Denies blood thinners, denies neck pain.  Here with pain in her right mid femur.  She has shortening rotation of her right lower extremity.  She reports history of bilateral hip and bilateral knee replacement done with Dr. France Ina.  She states she took Tylenol  after her fall.  She received 50 mcg of fentanyl  with EMS.  She denies any other pain in her other extremities.  HPI     Home Medications Prior to Admission medications   Medication Sig Start Date End Date Taking? Authorizing Provider  acetaminophen  (TYLENOL ) 650 MG CR tablet Take 1,300 mg by mouth daily as needed for pain.   Yes [provider]  aspirin  EC 81 MG tablet Take 81 mg by mouth daily.   Yes [provider]  bimatoprost (LUMIGAN) 0.01 % SOLN Place 1 drop into both eyes at bedtime.   Yes [provider]  docusate sodium  (COLACE) 100 MG capsule Take 100 mg by mouth daily.   Yes [provider]  dorzolamide -timolol  (COSOPT ) 22.3-6.8 MG/ML ophthalmic solution Place 1 drop into both eyes 2 (two) times daily. 08/03/22  Yes [provider]  estradiol  (CLIMARA  - DOSED IN MG/24 HR) 0.0375 mg/24hr patch Place 0.0375 mg onto the skin once a week. 07/10/22  Yes [provider]  gabapentin  (NEURONTIN ) 400 MG capsule Take 400 mg by mouth daily.   Yes [provider]   lisinopril-hydrochlorothiazide  (ZESTORETIC) 10-12.5 MG tablet Take 1 tablet by mouth daily.   Yes [provider]  omeprazole  (PRILOSEC) 40 MG capsule Take 1 capsule (40 mg total) by mouth daily. 08/17/22  Yes Akula, Vijaya, MD  sertraline  (ZOLOFT ) 50 MG tablet Take 50 mg by mouth daily.   Yes [provider]  traZODone  (DESYREL ) 100 MG tablet Take 200 mg by mouth at bedtime.    Yes [provider]  Vitamin D, Ergocalciferol, (DRISDOL) 1.25 MG (50000 UNIT) CAPS capsule Take 50,000 Units by mouth once a week. 07/04/22  Yes [provider]      Allergies    Epinephrine  and Procaine    Review of Systems   Review of Systems  Respiratory:  Negative for shortness of breath.   Cardiovascular:  Positive for chest pain.  Musculoskeletal:  Positive for arthralgias.  All other systems reviewed and are negative.   Physical Exam Updated Vital Signs BP 102/67   Pulse (!) 125   Temp 98.2 F (36.8 C) (Oral)   Resp 13   Ht 5\' 10"  (1.778 m)   Wt 97.5 kg   SpO2 93%   BMI 30.85 kg/m  Physical Exam Vitals and nursing note reviewed.  Constitutional:      General: She is not in acute distress.    Appearance: She is well-developed.  HENT:     Head: Normocephalic and atraumatic.  Eyes:     Conjunctiva/sclera: Conjunctivae normal.  Cardiovascular:     Rate  and Rhythm: Normal rate and regular rhythm.     Heart sounds: No murmur heard. Pulmonary:     Effort: Pulmonary effort is normal. No respiratory distress.     Breath sounds: Normal breath sounds.  Abdominal:     Palpations: Abdomen is soft.     Tenderness: There is no abdominal tenderness.  Musculoskeletal:        General: Tenderness and signs of injury present.     Cervical back: Neck supple.     Comments: Shortening and rotation of right lower extremity.  Tenderness superiorly to right patella.  Skin:    General: Skin is warm and dry.     Capillary Refill: Capillary refill takes less than 2 seconds.   Neurological:     Mental Status: She is alert and oriented to person, place, and time.     Comments: 5 out of 5 strength bilateral lower extremities  Psychiatric:        Mood and Affect: Mood normal.     ED Results / Procedures / Treatments   Labs (all labs ordered are listed, but only abnormal results are displayed) Labs Reviewed  CBC - Abnormal; Notable for the following components:      Result Value   WBC 11.1 (*)    Hemoglobin 11.9 (*)    All other components within normal limits  BASIC METABOLIC PANEL WITH GFR - Abnormal; Notable for the following components:   Glucose, Bld 109 (*)    GFR, Estimated 57 (*)    All other components within normal limits  MAGNESIUM   PHOSPHORUS  TROPONIN I (HIGH SENSITIVITY)  TROPONIN I (HIGH SENSITIVITY)    EKG EKG Interpretation Date/Time:  Wednesday April 03 2024 13:32:27 EDT Ventricular Rate:  146 PR Interval:    QRS Duration:  89 QT Interval:  305 QTC Calculation: 476 R Axis:   -3  Text Interpretation: Atrial fibrillation with rapid V-rate Anterior infarct, old Repolarization abnormality, prob rate related Confirmed by [name] [phone]) on 04/03/2024 1:38:13 PM  Radiology DG Hip Unilat W or Wo Pelvis 2-3 Views Right Result Date: 04/03/2024 CLINICAL DATA:  Fall.  Shortening rotation of the right leg. EXAM: RIGHT FEMUR 2 VIEWS; DG HIP (WITH OR WITHOUT PELVIS) 2-3V RIGHT; RIGHT KNEE - COMPLETE 4+ VIEW COMPARISON:  Right hip radiographs 07/05/2022. FINDINGS: And oblique fracture of the distal femoral metaphysis is present just above the femoral component of the total knee arthroplasty. The fracture is displaced laterally and slightly posteriorly. Valgus angulation is noted. The right hip arthroplasty is intact. No fractures are present adjacent to the arthroplasty. The knee arthroplasty is also intact. IMPRESSION: 1. Oblique fracture of the distal femoral metaphysis just above the femoral component of the total knee arthroplasty. 2.  The right hip and knee arthroplasty are intact. Electronically Signed   By: Audree Leas M.D.   On: 04/03/2024 11:18   DG Knee Complete 4 Views Right Result Date: 04/03/2024 CLINICAL DATA:  Fall.  Shortening rotation of the right leg. EXAM: RIGHT FEMUR 2 VIEWS; DG HIP (WITH OR WITHOUT PELVIS) 2-3V RIGHT; RIGHT KNEE - COMPLETE 4+ VIEW COMPARISON:  Right hip radiographs 07/05/2022. FINDINGS: And oblique fracture of the distal femoral metaphysis is present just above the femoral component of the total knee arthroplasty. The fracture is displaced laterally and slightly posteriorly. Valgus angulation is noted. The right hip arthroplasty is intact. No fractures are present adjacent to the arthroplasty. The knee arthroplasty is also intact. IMPRESSION: 1. Oblique fracture of the distal femoral  metaphysis just above the femoral component of the total knee arthroplasty. 2. The right hip and knee arthroplasty are intact. Electronically Signed   By: Audree Leas M.D.   On: 04/03/2024 11:18   DG Femur Min 2 Views Right Result Date: 04/03/2024 CLINICAL DATA:  Fall.  Shortening rotation of the right leg. EXAM: RIGHT FEMUR 2 VIEWS; DG HIP (WITH OR WITHOUT PELVIS) 2-3V RIGHT; RIGHT KNEE - COMPLETE 4+ VIEW COMPARISON:  Right hip radiographs 07/05/2022. FINDINGS: And oblique fracture of the distal femoral metaphysis is present just above the femoral component of the total knee arthroplasty. The fracture is displaced laterally and slightly posteriorly. Valgus angulation is noted. The right hip arthroplasty is intact. No fractures are present adjacent to the arthroplasty. The knee arthroplasty is also intact. IMPRESSION: 1. Oblique fracture of the distal femoral metaphysis just above the femoral component of the total knee arthroplasty. 2. The right hip and knee arthroplasty are intact. Electronically Signed   By: Audree Leas M.D.   On: 04/03/2024 11:18   DG Chest Portable 1 View Result Date:  04/03/2024 CLINICAL DATA:  Marvell Slider. EXAM: PORTABLE CHEST 1 VIEW COMPARISON:  08/15/2022 FINDINGS: Normal sized heart. Tortuous and partially calcified thoracic aorta. Clear lungs with normal vascularity. No fracture or pneumothorax seen. Mild-to-moderate right glenohumeral degenerative changes. IMPRESSION: No acute abnormality. Electronically Signed   By: Catherin Closs M.D.   On: 04/03/2024 11:17    Procedures Procedures   Medications Ordered in ED Medications  diltiazem  (CARDIZEM ) 1 mg/mL load via infusion 15 mg (15 mg Intravenous Bolus from Bag 04/03/24 1355)    And  diltiazem  (CARDIZEM ) 125 mg in dextrose  5% 125 mL (1 mg/mL) infusion (7.5 mg/hr Intravenous Rate/Dose Change 04/03/24 1435)  potassium chloride  SA (KLOR-CON  M) CR tablet 20 mEq (has no administration in time range)  magnesium  sulfate IVPB 2 g 50 mL (has no administration in time range)  metoprolol  tartrate (LOPRESSOR ) injection 2.5 mg (has no administration in time range)  acetaminophen  (TYLENOL ) tablet 650 mg (has no administration in time range)    Or  acetaminophen  (TYLENOL ) suppository 650 mg (has no administration in time range)  ondansetron  (ZOFRAN ) tablet 4 mg (has no administration in time range)    Or  ondansetron  (ZOFRAN ) injection 4 mg (has no administration in time range)  morphine  (PF) 4 MG/ML injection 4 mg (has no administration in time range)  oxyCODONE  (Oxy IR/ROXICODONE ) immediate release tablet 5 mg (has no administration in time range)  morphine  (PF) 4 MG/ML injection 4 mg (4 mg Intravenous Given 04/03/24 1126)  ondansetron  (ZOFRAN ) injection 4 mg (4 mg Intravenous Given 04/03/24 1126)    ED Course/ Medical Decision Making/ A&P Clinical Course as of 04/03/24 1510  Wed Apr 03, 2024  1016 Tylenol  after fall. Mehcanical fall this morning. No CP or SOB before hand. No blood thinenrs, didn't strike head. Shortening and rotation to RLE. Hx of bilateral hip and knee replacement [CG]  53 89 yo female here with  mechanical fall at home now with right mid-femur fx.  Hx of right knee surgery with Dr Otha Blight.  Not on A/C.  No head injury reported.  Her son at bedside says she is fairly independent, walks with a walker, no dementia issues, lives with her son in the house.  Pt reports pain under control when not moving leg.  Peripheral pulses intact.  Plan to discuss with orthopedic surgeon and admit [MT]  1338 New onset a fib while here in the department. Endorsing chest  discomfort and lightheadedness. No hx of afib, no AC [CG]  1503 400 amio po perioperatiely or continue dilt drip. If amiodarone, dose again tomorrow. Dr. Addie Holstein, cardiology, will consult on her if needed [CG]    Clinical Course User Index [CG] Adel Aden, PA-C [MT] Gordon Latus Janalyn Me, MD   Medical Decision Making Amount and/or Complexity of Data Reviewed Labs: ordered. Radiology: ordered.  Risk Prescription drug management. Decision regarding hospitalization.   89 year old female presents for evaluation.  Please see HPI for further details.  On examination patient is afebrile and nontachycardic.  Her lung sounds are clear bilaterally, she has no hypoxia.  Abdomen soft and compressible.  Neurological examinations are baseline.  Patient right lower extremity is shortened and externally rotated.  Tenderness superiorly to right patella.  Will collect x-ray imaging as well as basic labs.  Will provide patient with pain control in the form of 4 mg of morphine .  Patient CBC with a leukocytosis 11.1, hemoglobin baseline 11.9.  Metabolic panel unremarkable, anion gap 10, no electrolyte derangement.  Patient x-rays reveal an oblique fracture of the distal femoral metaphysis just above the femoral component of the total knee arthroplasty.  Right hip and knee arthroplasty intact.  Discussed findings with Greta Leatherwood, PA-C of orthopedics.  He is requesting patient be transferred to [facility] for surgery in the morning.  Patient will be  made n.p.o. after midnight.  During the course of patient workup, nursing staff notified me that the patient heart rate did elevate.  Repeat EKG was collected which shows A-fib RVR.  Patient denies a history of A-fib, denies anticoagulation.  Will consult with cardiology for recommendations.  Patient started on diltiazem  load and infusion at this time.  Patient discussed with Dr. Bonita Bussing, [facility] hospitalist.  He has requested consultation with cardiology for A-fib RVR.  He has agreed to admit patient for surgery in the morning.  Addendum: Spoke with Dr. Addie Holstein of cardiology.  He recommends either continuing with patient diltiazem  or transitioning to p.o. amiodarone 400 mg perioperatively.  He reports that he believes patient A-fib is secondary to pain from femur fracture.  He states cardiology service will consult on the patient as needed while she is admitted.  These recommendations were given to Dr. Bonita Bussing.  Final Clinical Impression(s) / ED Diagnoses Final diagnoses:  Closed fracture of distal end of right femur, unspecified fracture morphology, initial encounter [facility])  Atrial fibrillation with RVR [facility])    Rx / DC Orders ED Discharge Orders          Ordered    Amb referral to AFIB Clinic        04/03/24 1502              Kristin Peyer 04/03/24 1428    Arvilla Birmingham, MD 04/03/24 1444    Adel Aden, PA-C 04/03/24 1510    Arvilla Birmingham, MD 04/04/24 1705

## 2024-04-03 NOTE — ED Notes (Signed)
Report given to charlotte, RN

## 2024-04-03 NOTE — ED Notes (Signed)
Attempted to give report-no answer.

## 2024-04-03 NOTE — ED Notes (Signed)
 Patient complaining of chest pain with tachycardia. EKG done. PA at bedside.

## 2024-04-03 NOTE — Plan of Care (Signed)

## 2024-04-03 NOTE — ED Notes (Signed)
 [facility] at bedside. Patient appears awake, alert and oriented. Not in distress-maintained on oxygen at 2 LPM per nasal cannula.

## 2024-04-03 NOTE — ED Notes (Signed)
 Attempted to give report and spoke to [facility] and connected me to the Primary Nurse-not available at this time.

## 2024-04-03 NOTE — ED Triage Notes (Signed)
 Brought in by EMS from home for right upper leg injury R/T fall. Shortening of leg noted. Patient appears awake, alert and oriented. Denies LOC and does not take blood thinner. Patient uses a walker at home. Peripheral line started by EMS on left hand g 20. Fentanyl  50 mcg IV and saturation dropped to 89% with no change in consciousness, oxygen started at 4 LPM per [place] with a saturation of 98%.   Patient attached to monitor. VS taken, clothes removed.

## 2024-04-04 ENCOUNTER — Other Ambulatory Visit: Payer: Self-pay

## 2024-04-04 ENCOUNTER — Inpatient Hospital Stay (HOSPITAL_COMMUNITY): Payer: Self-pay

## 2024-04-04 ENCOUNTER — Inpatient Hospital Stay (HOSPITAL_COMMUNITY)

## 2024-04-04 ENCOUNTER — Encounter (HOSPITAL_COMMUNITY)
Admission: EM | Disposition: A | Discharge status: Rehabilitation Facility | Payer: Self-pay | Source: Home / Self Care | Attending: Internal Medicine

## 2024-04-04 ENCOUNTER — Encounter (HOSPITAL_COMMUNITY): Payer: Self-pay | Admitting: Internal Medicine

## 2024-04-04 DIAGNOSIS — S72331A Displaced oblique fracture of shaft of right femur, initial encounter for closed fracture: Secondary | ICD-10-CM | POA: Diagnosis not present

## 2024-04-04 DIAGNOSIS — S72461A Displaced supracondylar fracture with intracondylar extension of lower end of right femur, initial encounter for closed fracture: Secondary | ICD-10-CM | POA: Diagnosis not present

## 2024-04-04 HISTORY — PX: ORIF FEMUR FRACTURE: SHX2119

## 2024-04-04 LAB — CBC
HCT: 32.9 % — ABNORMAL LOW (ref 36.0–46.0)
HCT: 30.5 % — ABNORMAL LOW (ref 36.0–46.0)
Hemoglobin: 10.4 g/dL — ABNORMAL LOW (ref 12.0–15.0)
Hemoglobin: 9.6 g/dL — ABNORMAL LOW (ref 12.0–15.0)
MCH: 29.5 pg (ref 26.0–34.0)
MCH: 29.4 pg (ref 26.0–34.0)
MCHC: 31.6 g/dL (ref 30.0–36.0)
MCHC: 31.5 g/dL (ref 30.0–36.0)
MCV: 93.5 fL (ref 80.0–100.0)
MCV: 93.6 fL (ref 80.0–100.0)
Platelets: 248 10*3/uL (ref 150–400)
Platelets: 189 10*3/uL (ref 150–400)
RBC: 3.52 MIL/uL — ABNORMAL LOW (ref 3.87–5.11)
RBC: 3.26 MIL/uL — ABNORMAL LOW (ref 3.87–5.11)
RDW: 14.4 % (ref 11.5–15.5)
RDW: 14.4 % (ref 11.5–15.5)
WBC: 13.1 10*3/uL — ABNORMAL HIGH (ref 4.0–10.5)
WBC: 14.6 10*3/uL — ABNORMAL HIGH (ref 4.0–10.5)
nRBC: 0 % (ref 0.0–0.2)
nRBC: 0 % (ref 0.0–0.2)

## 2024-04-04 LAB — COMPREHENSIVE METABOLIC PANEL WITH GFR
ALT: 15 U/L (ref 0–44)
AST: 24 U/L (ref 15–41)
Albumin: 3.1 g/dL — ABNORMAL LOW (ref 3.5–5.0)
Alkaline Phosphatase: 37 U/L — ABNORMAL LOW (ref 38–126)
Anion gap: 11 (ref 5–15)
BUN: 26 mg/dL — ABNORMAL HIGH (ref 8–23)
CO2: 25 mmol/L (ref 22–32)
Calcium: 9.2 mg/dL (ref 8.9–10.3)
Chloride: 103 mmol/L (ref 98–111)
Creatinine, Ser: 2.08 mg/dL — ABNORMAL HIGH (ref 0.44–1.00)
GFR, Estimated: 22 mL/min — ABNORMAL LOW (ref >60)
Glucose, Bld: 137 mg/dL — ABNORMAL HIGH (ref 70–99)
Potassium: 4.5 mmol/L (ref 3.5–5.1)
Sodium: 139 mmol/L (ref 135–145)
Total Bilirubin: 0.7 mg/dL (ref 0.0–1.2)
Total Protein: 5.1 g/dL — ABNORMAL LOW (ref 6.5–8.1)

## 2024-04-04 LAB — CREATININE, SERUM
Creatinine, Ser: 1.91 mg/dL — ABNORMAL HIGH (ref 0.44–1.00)
GFR, Estimated: 24 mL/min — ABNORMAL LOW (ref >60)

## 2024-04-04 SURGERY — OPEN REDUCTION INTERNAL FIXATION (ORIF) DISTAL FEMUR FRACTURE
Anesthesia: General | Site: Leg Upper | Laterality: Right

## 2024-04-04 MED ORDER — ONDANSETRON HCL 4 MG/2ML IJ SOLN
INTRAMUSCULAR | Status: DC | PRN
  Administered 2024-04-04: 4 mg via INTRAVENOUS

## 2024-04-04 MED ORDER — LIDOCAINE 2% (20 MG/ML) 5 ML SYRINGE
INTRAMUSCULAR | Status: AC
Start: 2024-04-04 — End: ?
  Filled 2024-04-04: qty 5

## 2024-04-04 MED ORDER — ROCURONIUM BROMIDE 10 MG/ML (PF) SYRINGE
PREFILLED_SYRINGE | INTRAVENOUS | Status: DC | PRN
  Administered 2024-04-04 (×2): 20 mg via INTRAVENOUS
  Administered 2024-04-04: 60 mg via INTRAVENOUS

## 2024-04-04 MED ORDER — EPHEDRINE SULFATE-NACL 50-0.9 MG/10ML-% IV SOSY
PREFILLED_SYRINGE | INTRAVENOUS | Status: DC | PRN
  Administered 2024-04-04: 15 mg via INTRAVENOUS
  Administered 2024-04-04 (×2): 5 mg via INTRAVENOUS

## 2024-04-04 MED ORDER — METOCLOPRAMIDE HCL 5 MG PO TABS: 5.0000 mg | ORAL_TABLET | Freq: Three times a day (TID) | ORAL | Status: DC | PRN

## 2024-04-04 MED ORDER — DROPERIDOL 2.5 MG/ML IJ SOLN: 0.6250 mg | Freq: Once | INTRAMUSCULAR | Status: DC | PRN

## 2024-04-04 MED ORDER — CHLORHEXIDINE GLUCONATE 4 % EX SOLN
CUTANEOUS | Status: AC
  Filled 2024-04-04: qty 15

## 2024-04-04 MED ORDER — DOCUSATE SODIUM 100 MG PO CAPS: 100.0000 mg | ORAL_CAPSULE | Freq: Two times a day (BID) | ORAL | Status: DC

## 2024-04-04 MED ORDER — FENTANYL CITRATE (PF) 250 MCG/5ML IJ SOLN
INTRAMUSCULAR | Status: AC
  Filled 2024-04-04: qty 5

## 2024-04-04 MED ORDER — DEXAMETHASONE SODIUM PHOSPHATE 10 MG/ML IJ SOLN
INTRAMUSCULAR | Status: DC | PRN
  Administered 2024-04-04: 8 mg via INTRAVENOUS

## 2024-04-04 MED ORDER — PROPOFOL 10 MG/ML IV BOLUS
INTRAVENOUS | Status: DC | PRN
  Administered 2024-04-04: 100 mg via INTRAVENOUS

## 2024-04-04 MED ORDER — LIDOCAINE 2% (20 MG/ML) 5 ML SYRINGE
INTRAMUSCULAR | Status: DC | PRN
  Administered 2024-04-04: 100 mg via INTRAVENOUS

## 2024-04-04 MED ORDER — OXYCODONE HCL 5 MG PO TABS: 5.0000 mg | ORAL_TABLET | Freq: Once | ORAL | Status: DC | PRN

## 2024-04-04 MED ORDER — ENOXAPARIN SODIUM 40 MG/0.4ML IJ SOSY
40.0000 mg | PREFILLED_SYRINGE | INTRAMUSCULAR | Status: DC
  Administered 2024-04-05 – 2024-04-06 (×2): 40 mg via SUBCUTANEOUS
  Filled 2024-04-04 (×2): qty 0.4

## 2024-04-04 MED ORDER — ACETAMINOPHEN 650 MG RE SUPP: 650.0000 mg | Freq: Four times a day (QID) | RECTAL | Status: DC

## 2024-04-04 MED ORDER — LACTATED RINGERS IV SOLN: INTRAVENOUS | Status: DC | PRN

## 2024-04-04 MED ORDER — FENTANYL CITRATE (PF) 100 MCG/2ML IJ SOLN: 25.0000 ug | INTRAMUSCULAR | Status: DC | PRN

## 2024-04-04 MED ORDER — TRANEXAMIC ACID-NACL 1000-0.7 MG/100ML-% IV SOLN
1000.0000 mg | Freq: Once | INTRAVENOUS | Status: AC
  Administered 2024-04-04: 1000 mg via INTRAVENOUS
  Filled 2024-04-04: qty 100

## 2024-04-04 MED ORDER — ADULT MULTIVITAMIN W/MINERALS CH
1.0000 | ORAL_TABLET | Freq: Every day | ORAL | Status: DC
  Administered 2024-04-04 – 2024-04-09 (×6): 1 via ORAL
  Filled 2024-04-04 (×6): qty 1

## 2024-04-04 MED ORDER — PHENYLEPHRINE 80 MCG/ML (10ML) SYRINGE FOR IV PUSH (FOR BLOOD PRESSURE SUPPORT)
PREFILLED_SYRINGE | INTRAVENOUS | Status: AC
  Filled 2024-04-04: qty 10

## 2024-04-04 MED ORDER — METOCLOPRAMIDE HCL 5 MG/ML IJ SOLN: 5.0000 mg | Freq: Three times a day (TID) | INTRAMUSCULAR | Status: DC | PRN

## 2024-04-04 MED ORDER — ALBUMIN HUMAN 5 % IV SOLN
INTRAVENOUS | Status: DC | PRN
Start: 2024-04-04 — End: 2024-04-04

## 2024-04-04 MED ORDER — SODIUM CHLORIDE 0.9 % IV SOLN: INTRAVENOUS | Status: DC | PRN

## 2024-04-04 MED ORDER — PHENYLEPHRINE HCL-NACL 20-0.9 MG/250ML-% IV SOLN
INTRAVENOUS | Status: DC | PRN
  Administered 2024-04-04: 75 ug/min via INTRAVENOUS

## 2024-04-04 MED ORDER — ACETAMINOPHEN 325 MG PO TABS
650.0000 mg | ORAL_TABLET | Freq: Four times a day (QID) | ORAL | Status: DC
  Administered 2024-04-04 – 2024-04-09 (×16): 650 mg via ORAL
  Filled 2024-04-04 (×16): qty 2

## 2024-04-04 MED ORDER — ENSURE ENLIVE PO LIQD
237.0000 mL | Freq: Two times a day (BID) | ORAL | Status: DC
  Administered 2024-04-04 – 2024-04-09 (×10): 237 mL via ORAL

## 2024-04-04 MED ORDER — PROPOFOL 10 MG/ML IV BOLUS
INTRAVENOUS | Status: AC
  Filled 2024-04-04: qty 20

## 2024-04-04 MED ORDER — PHENYLEPHRINE 80 MCG/ML (10ML) SYRINGE FOR IV PUSH (FOR BLOOD PRESSURE SUPPORT)
PREFILLED_SYRINGE | INTRAVENOUS | Status: DC | PRN
  Administered 2024-04-04: 80 ug via INTRAVENOUS
  Administered 2024-04-04 (×4): 160 ug via INTRAVENOUS

## 2024-04-04 MED ORDER — CHLORHEXIDINE GLUCONATE 0.12 % MT SOLN
OROMUCOSAL | Status: AC
Start: 2024-04-04 — End: 2024-04-04
  Administered 2024-04-04: 15 mL
  Filled 2024-04-04: qty 15

## 2024-04-04 MED ORDER — PHENOL 1.4 % MT LIQD: 1.0000 | OROMUCOSAL | Status: DC | PRN

## 2024-04-04 MED ORDER — SUGAMMADEX SODIUM 200 MG/2ML IV SOLN
INTRAVENOUS | Status: DC | PRN
  Administered 2024-04-04: 200 mg via INTRAVENOUS

## 2024-04-04 MED ORDER — CEFAZOLIN SODIUM-DEXTROSE 2-4 GM/100ML-% IV SOLN
2.0000 g | Freq: Once | INTRAVENOUS | Status: AC
  Administered 2024-04-04: 2 g via INTRAVENOUS
  Filled 2024-04-04: qty 100

## 2024-04-04 MED ORDER — ONDANSETRON HCL 4 MG/2ML IJ SOLN
INTRAMUSCULAR | Status: AC
  Filled 2024-04-04: qty 2

## 2024-04-04 MED ORDER — ROCURONIUM BROMIDE 10 MG/ML (PF) SYRINGE
PREFILLED_SYRINGE | INTRAVENOUS | Status: AC
  Filled 2024-04-04: qty 10

## 2024-04-04 MED ORDER — 0.9 % SODIUM CHLORIDE (POUR BTL) OPTIME
TOPICAL | Status: DC | PRN
  Administered 2024-04-04: 1000 mL

## 2024-04-04 MED ORDER — OXYCODONE HCL 5 MG/5ML PO SOLN: 5.0000 mg | Freq: Once | ORAL | Status: DC | PRN

## 2024-04-04 MED ORDER — DEXAMETHASONE SODIUM PHOSPHATE 10 MG/ML IJ SOLN
INTRAMUSCULAR | Status: AC
  Filled 2024-04-04: qty 1

## 2024-04-04 MED ORDER — CEFAZOLIN SODIUM-DEXTROSE 2-4 GM/100ML-% IV SOLN
2.0000 g | Freq: Four times a day (QID) | INTRAVENOUS | Status: DC
  Filled 2024-04-04 (×2): qty 100

## 2024-04-04 MED ORDER — ACETAMINOPHEN 500 MG PO TABS
1000.0000 mg | ORAL_TABLET | Freq: Once | ORAL | Status: AC
  Administered 2024-04-04: 1000 mg via ORAL
  Filled 2024-04-04: qty 2

## 2024-04-04 MED ORDER — MENTHOL 3 MG MT LOZG: 1.0000 | LOZENGE | OROMUCOSAL | Status: DC | PRN

## 2024-04-04 MED ORDER — FENTANYL CITRATE (PF) 250 MCG/5ML IJ SOLN
INTRAMUSCULAR | Status: DC | PRN
Start: 2024-04-04 — End: 2024-04-04
  Administered 2024-04-04: 50 ug via INTRAVENOUS

## 2024-04-04 SURGICAL SUPPLY — 65 items
BAG COUNTER SPONGE SURGICOUNT (BAG) ×1 IMPLANT
BIT DRILL QC LG EVOS 3.7 SN (DRILL) IMPLANT
BLADE CLIPPER SURG (BLADE) IMPLANT
BNDG ELASTIC 4X5.8 VLCR STR LF (GAUZE/BANDAGES/DRESSINGS) ×1 IMPLANT
BNDG ELASTIC 6INX 5YD STR LF (GAUZE/BANDAGES/DRESSINGS) ×1 IMPLANT
BNDG GAUZE DERMACEA FLUFF 4 (GAUZE/BANDAGES/DRESSINGS) ×1 IMPLANT
BRUSH SCRUB EZ PLAIN DRY (MISCELLANEOUS) ×2 IMPLANT
CANISTER SUCT 3000ML PPV (MISCELLANEOUS) ×1 IMPLANT
COVER SURGICAL LIGHT HANDLE (MISCELLANEOUS) ×1 IMPLANT
DRAPE C-ARM 42X72 X-RAY (DRAPES) ×1 IMPLANT
DRAPE C-ARMOR (DRAPES) ×1 IMPLANT
DRAPE IMP U-DRAPE 54X76 (DRAPES) ×1 IMPLANT
DRAPE SURG ORHT 6 SPLT 77X108 (DRAPES) ×3 IMPLANT
DRAPE U-SHAPE 47X51 STRL (DRAPES) ×1 IMPLANT
DRILL TARG QC EVOS 2.5 LG (DRILL) IMPLANT
DRSG ADAPTIC 3X8 NADH LF (GAUZE/BANDAGES/DRESSINGS) ×1 IMPLANT
DRSG MEPILEX POST OP 4X8 (GAUZE/BANDAGES/DRESSINGS) IMPLANT
ELECTRODE REM PT RTRN 9FT ADLT (ELECTROSURGICAL) ×1 IMPLANT
EVACUATOR 1/8 PVC DRAIN (DRAIN) IMPLANT
EVACUATOR 3/16 PVC DRAIN (DRAIN) IMPLANT
GAUZE PAD ABD 8X10 STRL (GAUZE/BANDAGES/DRESSINGS) ×4 IMPLANT
GAUZE SPONGE 4X4 12PLY STRL (GAUZE/BANDAGES/DRESSINGS) ×1 IMPLANT
GLOVE BIO SURGEON STRL SZ7.5 (GLOVE) ×1 IMPLANT
GLOVE BIO SURGEON STRL SZ8 (GLOVE) ×1 IMPLANT
GLOVE BIOGEL PI IND STRL 7.5 (GLOVE) ×1 IMPLANT
GLOVE BIOGEL PI IND STRL 8 (GLOVE) ×1 IMPLANT
GLOVE SURG ORTHO LTX SZ7.5 (GLOVE) ×2 IMPLANT
GOWN STRL REUS W/ TWL LRG LVL3 (GOWN DISPOSABLE) ×2 IMPLANT
GOWN STRL REUS W/ TWL XL LVL3 (GOWN DISPOSABLE) ×1 IMPLANT
KIT BASIN OR (CUSTOM PROCEDURE TRAY) ×1 IMPLANT
KIT TURNOVER KIT B (KITS) ×1 IMPLANT
KWIRE FIX TT 2X350 (WIRE) IMPLANT
NDL 22X1.5 STRL (OR ONLY) (MISCELLANEOUS) IMPLANT
NEEDLE 22X1.5 STRL (OR ONLY) (MISCELLANEOUS) IMPLANT
NS IRRIG 1000ML POUR BTL (IV SOLUTION) ×1 IMPLANT
PACK TOTAL JOINT (CUSTOM PROCEDURE TRAY) ×1 IMPLANT
PACK UNIVERSAL I (CUSTOM PROCEDURE TRAY) ×1 IMPLANT
PAD ARMBOARD POSITIONER FOAM (MISCELLANEOUS) ×2 IMPLANT
PAD CAST 4YDX4 CTTN HI CHSV (CAST SUPPLIES) ×1 IMPLANT
PADDING CAST COTTON 6X4 STRL (CAST SUPPLIES) ×1 IMPLANT
PLATE FEM EVOS 3.5X333 16H RT (Plate) IMPLANT
SCREW CORT ST EVOS 4.5X36 (Screw) IMPLANT
SCREW CORT ST EVOS 4.5X40 (Screw) IMPLANT
SCREW CORT ST EVOS 4.5X80 STL (Screw) IMPLANT
SCREW EVOS S-T CTX ST 4.5X34 (Screw) IMPLANT
SCREW LOCK 28X3.5XSTSTRL (Screw) IMPLANT
SCREW LOCK ST EVOS 3.5X24 (Screw) IMPLANT
SCREW LOCK ST EVOS 4.5X30 (Screw) IMPLANT
SCREW LOCK ST EVOS 4.5X32 STL (Screw) IMPLANT
SCREW LOCK ST EVOS 4.5X34 STL (Screw) IMPLANT
SCREW LOCK ST EVOS 4.5X85 (Anchor) ×1 IMPLANT
SCREW S-T CORTEX 4.5X85 (Screw) ×2 IMPLANT
SPONGE T-LAP 18X18 ~~LOC~~+RFID (SPONGE) ×1 IMPLANT
STAPLER VISISTAT 35W (STAPLE) ×1 IMPLANT
SUCTION TUBE FRAZIER 10FR DISP (SUCTIONS) ×1 IMPLANT
SUT ETHILON 2 0 FS 18 (SUTURE) IMPLANT
SUT PROLENE 0 CT 2 (SUTURE) IMPLANT
SUT VIC AB 0 CT1 27XBRD ANBCTR (SUTURE) ×2 IMPLANT
SUT VIC AB 1 CT1 27XBRD ANBCTR (SUTURE) ×2 IMPLANT
SUT VIC AB 2-0 CT1 TAPERPNT 27 (SUTURE) ×2 IMPLANT
SYR 20ML ECCENTRIC (SYRINGE) IMPLANT
TOWEL GREEN STERILE (TOWEL DISPOSABLE) ×2 IMPLANT
TOWEL GREEN STERILE FF (TOWEL DISPOSABLE) ×1 IMPLANT
TRAY FOLEY MTR SLVR 16FR STAT (SET/KITS/TRAYS/PACK) IMPLANT
WATER STERILE IRR 1000ML POUR (IV SOLUTION) ×2 IMPLANT

## 2024-04-04 NOTE — Anesthesia Procedure Notes (Signed)
 Procedure Name: Intubation Date/Time: 04/04/2024 8:17 AM  Performed by: Alphia Jasmine, CRNAPre-anesthesia Checklist: Patient identified, Emergency Drugs available, Suction available, Timeout performed and Patient being monitored Patient Re-evaluated:Patient Re-evaluated prior to induction Oxygen Delivery Method: Circle system utilized Preoxygenation: Pre-oxygenation with 100% oxygen Induction Type: IV induction Ventilation: Mask ventilation without difficulty Laryngoscope Size: Mac and 3 Grade View: Grade II Tube type: Oral Tube size: 7.0 mm Number of attempts: 1 Airway Equipment and Method: Stylet Placement Confirmation: ETT inserted through vocal cords under direct vision, positive ETCO2, CO2 detector and breath sounds checked- equal and bilateral Secured at: 22 cm Tube secured with: Tape Dental Injury: Teeth and Oropharynx as per pre-operative assessment

## 2024-04-04 NOTE — Op Note (Signed)
 NAME: [name] MEDICAL RECORD OZ:[id number] DATE OF BIRTH:1930-02-09 PHYSICIAN: Arlette Lagos, MD  OPERATIVE REPORT  DATE OF PROCEDURE:  04/04/2024  PREOPERATIVE DIAGNOSIS:   RIGHT SUPRACONDYLAR PERIPROSTHETIC DISTAL FEMUR FRACTURE BELOW TOTAL HIP ARTHROPLASTY. MORBID OBESITY.  POSTOPERATIVE DIAGNOSIS:   RIGHT SUPRACONDYLAR PERIPROSTHETIC DISTAL FEMUR FRACTURE BELOW TOTAL HIP ARTHROPLASTY. MORBID OBESITY.  PROCEDURE:  OPEN REDUCTION INTERNAL FIXATION OF RIGHT SUPRACONDYLAR FEMUR FRACTURE using Smith Nephew plate.  SURGEON:  Hardy Lia, MD  ASSISTANT: 1.  [name], PA-C. 2.  PA student.  ANESTHESIA:  General.  ESTIMATED BLOOD LOSS:  100 mL.  DRAINS:  None.  SPECIMENS:  None.  DISPOSITION OF SPECIMENS:  None applicable.  COUNTS:  Yes.  TOURNIQUET:  None.  DISPOSITION:  To PACU.  CONDITION:  Stable.    COMPLICATIONS:  None.  DELAY START OF PHARMACOLOGIC VTE AGENT DUE TO SURGICAL BLOOD LOSS OR RISK OF BLEEDING:  No  SUMMARY FOR PROCEDURE:  The patient is 89 y.o. who lives independently at home, occasionally uses a cane or walker, who sustained injury in ground level fall, resulting in a comminuted fracture of the supracondylar region of the femur. The patient also has a total hip arthroplasty above the fracture. We discussed with the patient the risks and benefits of surgical repair, including the possibility of nerve injury, vessel injury, infection, malunion, nonunion, symptomatic hardware, heart attack, stroke, DVT, PE, loss of motion, arthritis and multiple others.  After acknowledging these risks, consent was provided to proceed.  BRIEF SUMMARY OF PROCEDURE:  The patient was taken to the operating room after administration of preoperative antibiotics.  The operative lower extremity was prepped and draped in the usual sterile fashion using traction to protect the neurovascular bundle  and being careful with the limb throughout.  Chlorhexidine  wash then  Betadine  scrub and paint were performed.  Timeout was held.  We brought in the C-arm to identify the correct starting point as well as the length of the plate.  A lateral approach to the distal femur was then made, carrying dissection down through the IT band, preserving the supracondylar blood supply, and then passing the plate retrograde along the femoral shaft, bringing in the C-arm to confirm the appropriate position distally on AP and lateral views and proximally so that we could overlap the stem sufficiently and if feasible to obtain bicortical screw fixation around the hip stem.  We used towel bumps and a radiolucent triangle to dial in the reduction on the AP and lateral views while my assistant applied traction. We then placed pins in the distal segment then a standard screw, rechecked our position on AP and LAT views proximally and then placed a proximal standard screw below the stem to secure plate position and appose it. We continued with standard fixation in the shaft followed by some locked off axis screws around the hip stem. Distally we placed multiple screws in the supracondylar block, all eventually locked but beginning with standard screws and achieving good bite and those that traversed cement specifically. Final images showed appropriate position and length of all screws with maintained reduction. [name] and PA student were present and assisting throughout.  Assistant was necessary to apply manual traction for provisional and then definitive reduction and also assisted with deep exposure and some instrumentation, as well as with closure to further expedite his case.  There were no complications during the procedure.  After thorough irrigation, standard layer closure was performed with #1 Vicryl, 2-0 Vicryl and 2-0 nylon.  Sterile gently compressive dressing was applied from the foot to thigh.  The patient awakened from anesthesia and transported to PACU in stable condition.  PROGNOSIS:   The patient will receive pharmacologic DVT prophylaxis with Lovenox  in addition to her baseline 81 mg aspirin , be nonweightbearing with unrestricted range of motion of the knee, and standard joint precautions for the hip.  We anticipate discharge to a skilled nursing facility or rehab center and follow up in the office in 2 weeks for removal of sutures with new x-rays at that time.

## 2024-04-04 NOTE — Plan of Care (Signed)
  Problem: Education: Goal: Knowledge of General Education information will improve Description: Including pain rating scale, medication(s)/side effects and non-pharmacologic comfort measures Outcome: Progressing   Problem: Health Behavior/Discharge Planning: Goal: Ability to manage health-related needs will improve Outcome: Progressing   Problem: Clinical Measurements: Goal: Ability to maintain clinical measurements within normal limits will improve Outcome: Progressing Goal: Will remain free from infection Outcome: Progressing   Problem: Nutrition: Goal: Adequate nutrition will be maintained Outcome: Progressing   Problem: Coping: Goal: Level of anxiety will decrease Outcome: Progressing   Problem: Elimination: Goal: Will not experience complications related to bowel motility Outcome: Progressing Goal: Will not experience complications related to urinary retention Outcome: Progressing   Problem: Pain Managment: Goal: General experience of comfort will improve and/or be controlled Outcome: Progressing   Problem: Safety: Goal: Ability to remain free from injury will improve Outcome: Progressing

## 2024-04-04 NOTE — Plan of Care (Signed)
   Problem: Education: Goal: Knowledge of General Education information will improve Description Including pain rating scale, medication(s)/side effects and non-pharmacologic comfort measures Outcome: Progressing   Problem: Health Behavior/Discharge Planning: Goal: Ability to manage health-related needs will improve Outcome: Progressing

## 2024-04-04 NOTE — Progress Notes (Signed)
 Initial Nutrition Assessment  DOCUMENTATION CODES:   Not applicable  INTERVENTION:  Ensure Enlive po BID, each supplement provides 350 kcal and 20 grams of protein.  Multivitamin with minerals.   Bedtime snack ordered.   Encouraged protein and po intake.   NUTRITION DIAGNOSIS:   Increased nutrient needs related to hip fracture as evidenced by estimated needs.  GOAL:   Patient will meet greater than or equal to 90% of their needs  MONITOR:   PO intake, Supplement acceptance  REASON FOR ASSESSMENT:   Consult Assessment of nutrition requirement/status, Hip fracture protocol  ASSESSMENT:   Pt adm with R femoral shaft fracture between total hip arthroplasty and total knee arthroplasty.  Pt denies any wt loss in the past year. Pt reports appetite intact and that usual intake is 2 balanced meals a day cooked by patient's son. Pt sometimes snacks on things like yogurt or cottage cheese. Will order bedtime snack per pt request.   Medications reviewed and include: colace, protonix   Labs reviewed: Creatinine 2.08 H. Albumin  3.1 L,    Intake/Output Summary (Last 24 hours) at 04/04/2024 0944 Last data filed at 04/04/2024 2130 Gross per 24 hour  Intake 1450 ml  Output 50 ml  Net 1400 ml    NUTRITION - FOCUSED PHYSICAL EXAM:  Flowsheet Row Most Recent Value  Orbital Region No depletion  Upper Arm Region Moderate depletion  Thoracic and Lumbar Region No depletion  Buccal Region No depletion  Temple Region No depletion  Clavicle Bone Region No depletion  Clavicle and Acromion Bone Region No depletion  Scapular Bone Region No depletion  Dorsal Hand No depletion  Patellar Region No depletion  Anterior Thigh Region No depletion  Posterior Calf Region No depletion  Edema (RD Assessment) Mild  Hair Reviewed  Eyes Reviewed  Mouth Reviewed  Skin Reviewed  Nails Reviewed       Diet Order:   Diet Order             Diet NPO time specified  Diet effective midnight                    EDUCATION NEEDS:   Education needs have been addressed  Skin:  Skin Assessment: Reviewed RN Assessment  Last BM:  PTA  Height:   Ht Readings from Last 1 Encounters:  04/03/24 5\' 10"  (1.778 m)    Weight:   Wt Readings from Last 1 Encounters:  04/03/24 97.5 kg    Ideal Body Weight:  68.2 kg  BMI:  Body mass index is 30.85 kg/m.  Estimated Nutritional Needs:   Kcal:  1850-2100  Protein:  120-145  Fluid:  >1.85 L/day or per MD  Laren Player, MPH, RD, LDN Clinical Dietitian Contact information can be found at [facility]

## 2024-04-04 NOTE — TOC Initial Note (Signed)
 Transition of Care [facility]) - Initial/Assessment Note    Patient Details  Name: [name] MRN: [id number] Date of Birth: February 03, 1930  Transition of Care [facility]) CM/SW Contact:    Jessica Diones, RN Phone Number: 04/04/2024, 12:29 PM  Clinical Narrative:  Patient presented post fall-s/p ORIF-right femur fracture. PTA patient was from home with one of her sons. Son [name] was at the bedside during the visit. Patient has DME motorized scooter, cane and rolling walker. She has used [facility] services in the past. PT/OT to consult for recommendations. Case Manager will continue to follow for transition of care needs as the patient progresses.              Expected Discharge Plan: Skilled Nursing Facility Barriers to Discharge: Continued Medical Work up  Expected Discharge Plan and Services In-house Referral: NA Discharge Planning Services: CM Consult Post Acute Care Choice: Skilled Nursing Facility Living arrangements for the past 2 months: Single Family Home                   DME Agency: NA  Prior Living Arrangements/Services Living arrangements for the past 2 months: Single Family Home Lives with:: Adult Children (lives with son) Patient language and need for interpreter reviewed:: Yes Do you feel safe going back to the place where you live?: Yes      Need for Family Participation in Patient Care: Yes (Comment)   Current home services: DME (rolling walker) Criminal Activity/Legal Involvement Pertinent to Current Situation/Hospitalization: No - Comment as needed  Activities of Daily Living   ADL Screening (condition at time of admission) Independently performs ADLs?: No Does the patient have a NEW difficulty with bathing/dressing/toileting/self-feeding that is expected to last >3 days?: Yes (Initiates electronic notice to provider for possible OT consult) Does the patient have a NEW difficulty with getting in/out of bed, walking, or climbing stairs that is expected to last >3  days?: Yes (Initiates electronic notice to provider for possible PT consult) Does the patient have a NEW difficulty with communication that is expected to last >3 days?: No Is the patient deaf or have difficulty hearing?: No Does the patient have difficulty seeing, even when wearing glasses/contacts?: No Does the patient have difficulty concentrating, remembering, or making decisions?: No  Permission Sought/Granted Permission sought to share information with : Family Supports, Case Manager   Emotional Assessment Appearance:: Appears stated age   Alcohol / Substance Use: Not Applicable Psych Involvement: No (comment)  Admission diagnosis:  New onset atrial fibrillation (HCC) [I48.91] Closed displaced oblique fracture of shaft of right femur (HCC) [S72.331A] Atrial fibrillation with RVR (HCC) [I48.91] Closed fracture of distal end of right femur, unspecified fracture morphology, initial encounter (HCC) [S72.401A] Patient Active Problem List   Diagnosis Date Noted   Normocytic anemia 04/03/2024   Hyperglycemia 04/03/2024   Closed displaced oblique fracture of shaft of right femur (HCC) 04/03/2024   Atrial fibrillation with RVR (HCC) 04/03/2024   New onset atrial fibrillation (HCC) 04/03/2024   Glaucoma 04/03/2024   Aortic atherosclerosis (HCC) 04/03/2024   AKI (acute kidney injury) (HCC) 08/15/2022   Hypotension due to hypovolemia 08/15/2022   Cervical radiculopathy 04/13/2021   DDD (degenerative disc disease), cervical 08/21/2018   Non-traumatic rhabdomyolysis 04/18/2017   Lower urinary tract infectious disease 04/18/2017   Hypokalemia    Sepsis (HCC) 04/17/2017   Chronic pain syndrome 12/19/2014   Fibromyalgia 12/19/2014   Constipation 12/19/2014   Other hemorrhoids 12/19/2014   Polyneuropathy, unspecified 12/19/2014   Insomnia, unspecified  08/12/2014   OA (osteoarthritis) of hip 08/06/2014   Chest pain 04/05/2012   Dyspnea 04/05/2012   Intraductal papilloma of breast -  left breast 09/13/2011   Hyperlipidemia 02/14/2011   Depression 02/14/2011   Hereditary and idiopathic peripheral neuropathy 02/14/2011   Essential hypertension 02/14/2011   GERD 02/14/2011   Osteoarthritis 02/14/2011   URINARY INCONTINENCE, STRESS 02/14/2011   SKIN CANCER, HX OF 02/14/2011   PCP:  Pcp, No Pharmacy:   [id number] [place], [place] - [address] [place] [address] Phone: [phone] Fax: [phone]  [id number] [place], [place] - [address] [place] [address] Phone: [phone] Fax: [phone]  Social Drivers of Health (SDOH) Social History: SDOH Screenings   Food Insecurity: No Food Insecurity (04/03/2024)  Housing: Low Risk  (04/03/2024)  Transportation Needs: No Transportation Needs (04/03/2024)  Utilities: Not At Risk (04/03/2024)  Social Connections: Moderately Isolated (04/03/2024)  Tobacco Use: Low Risk  (04/04/2024)    Readmission Risk Interventions    08/17/2022   10:36 AM  Readmission Risk Prevention Plan  Transportation Screening Complete  PCP or Specialist Appt within 5-7 Days Complete  Home Care Screening Complete  Medication Review (RN CM) Complete

## 2024-04-04 NOTE — Anesthesia Postprocedure Evaluation (Signed)
 Anesthesia Post Note  Patient: [name]  Procedure(s) Performed: OPEN REDUCTION INTERNAL FIXATION RIGHT FEMUR FRACTURE (Right: Leg Upper)     Patient location during evaluation: PACU Anesthesia Type: General Level of consciousness: awake and alert Pain management: pain level controlled Vital Signs Assessment: post-procedure vital signs reviewed and stable Respiratory status: spontaneous breathing, nonlabored ventilation and respiratory function stable Cardiovascular status: blood pressure returned to baseline Postop Assessment: no apparent nausea or vomiting Anesthetic complications: no   No notable events documented.  Last Vitals:  Vitals:   04/04/24 1200 04/04/24 1215  BP: 104/73 (!) 113/54  Pulse: 89 85  Resp: 17 15  Temp: 37 C   SpO2: 94% 92%    Last Pain:  Vitals:   04/04/24 1215  TempSrc:   PainSc: 0-No pain                 Rayfield Cairo

## 2024-04-04 NOTE — Consult Note (Signed)
 Orthopaedic Trauma Service (OTS) Consultation   Patient ID: [name] MRN: [id number] DOB/AGE: September 25, 1930 89 y.o.   Reason for Consult: right periprosthetic fracture femur Referring Physician: Lucienne Ryder, MD  HPI: [name] is an 89 y.o. female ground level fall with comminuted right femoral shaft fracture between total hip arthroplasty and total knee arthroplasty. Denies other injuries. Pain is  moderately well controlled now, aching and dull, sharp and severe with motion, without associated distal tingling or numbness, and improved with narcotics.    Past Medical History:  Diagnosis Date   Anemia    Arthritis    Complication of anesthesia    "I GOT PNEUMONIA THE NEXT DAY FROM THE ANESTHESIA"   Depression    Fibromyalgia    GERD (gastroesophageal reflux disease)    History of skin cancer    HLD (hyperlipidemia)    HTN (hypertension)    Internal hemorrhoids    Osteoarthritis    Peripheral neuropathy    Shortness of breath    Skin cancer    SUI (stress urinary incontinence, female)    Ulcerative colitis     Past Surgical History:  Procedure Laterality Date   ABDOMINAL HYSTERECTOMY     APPENDECTOMY     BREAST EXCISIONAL BIOPSY Left 08/10/2011    Dr Linell Rhymes   BREAST SURGERY     CARPAL TUNNEL RELEASE Left    CESAREAN SECTION     CHOLECYSTECTOMY     COLONOSCOPY  04/29/2003   internal hemorrhoids   JOINT REPLACEMENT Bilateral 2010 and 2012   KNEES   TOTAL HIP ARTHROPLASTY Right 08/06/2014   Procedure: RIGHT TOTAL HIP ARTHROPLASTY ANTERIOR APPROACH;  Surgeon: Aurther Blue, MD;  Location: [facility];  Service: Orthopedics;  Laterality: Right;   TOTAL HIP ARTHROPLASTY Left 12/17/2014   Procedure: LEFT TOTAL HIP ARTHROPLASTY ANTERIOR APPROACH;  Surgeon: Aurther Blue, MD;  Location: [facility];  Service: Orthopedics;  Laterality: Left;    Family History  Problem Relation Age of Onset   Colon cancer Mother 32   Diabetes Brother     Social History:   reports that she has never smoked. She has never used smokeless tobacco. She reports that she does not drink alcohol and does not use drugs.  Allergies:  Allergies  Allergen Reactions   Epinephrine  Other (See Comments)    Rapid heart beat   Procaine Rash and Other (See Comments)    Novocaine caused face blisters.    Medications: Prior to Admission:  Medications Prior to Admission  Medication Sig Dispense Refill Last Dose/Taking   acetaminophen  (TYLENOL ) 650 MG CR tablet Take 1,300 mg by mouth daily as needed for pain.   04/03/2024   aspirin  EC 81 MG tablet Take 81 mg by mouth daily.   04/02/2024   bimatoprost (LUMIGAN) 0.01 % SOLN Place 1 drop into both eyes at bedtime.   04/02/2024   docusate sodium  (COLACE) 100 MG capsule Take 100 mg by mouth daily.   04/02/2024   dorzolamide -timolol  (COSOPT ) 22.3-6.8 MG/ML ophthalmic solution Place 1 drop into both eyes 2 (two) times daily.   04/02/2024   estradiol  (CLIMARA  - DOSED IN MG/24 HR) 0.0375 mg/24hr patch Place 0.0375 mg onto the skin once a week.   03/31/2024   gabapentin  (NEURONTIN ) 400 MG capsule Take 400 mg by mouth daily.   04/02/2024   lisinopril-hydrochlorothiazide  (ZESTORETIC) 10-12.5 MG tablet Take 1 tablet by mouth daily.   04/02/2024   omeprazole  (PRILOSEC) 40 MG  capsule Take 1 capsule (40 mg total) by mouth daily. 30 capsule 0 04/02/2024   sertraline  (ZOLOFT ) 50 MG tablet Take 50 mg by mouth daily.   04/02/2024   traZODone  (DESYREL ) 100 MG tablet Take 200 mg by mouth at bedtime.    04/02/2024   Vitamin D, Ergocalciferol, (DRISDOL) 1.25 MG (50000 UNIT) CAPS capsule Take 50,000 Units by mouth once a week.   Past Week    Results for orders placed or performed during the hospital encounter of 04/03/24 (from the past 48 hours)  CBC     Status: Abnormal   Collection Time: 04/03/24 11:01 AM  Result Value Ref Range   WBC 11.1 (H) 4.0 - 10.5 K/uL   RBC 4.07 3.87 - 5.11 MIL/uL   Hemoglobin 11.9 (L) 12.0 - 15.0 g/dL   HCT 91.4 78.2 - 95.6 %    MCV 94.1 80.0 - 100.0 fL   MCH 29.2 26.0 - 34.0 pg   MCHC 31.1 30.0 - 36.0 g/dL   RDW 21.3 08.6 - 57.8 %   Platelets 212 150 - 400 K/uL   nRBC 0.0 0.0 - 0.2 %    Comment: Performed at [facility], [address], [place], [place] [address]  Basic metabolic panel     Status: Abnormal   Collection Time: 04/03/24 11:01 AM  Result Value Ref Range   Sodium 138 135 - 145 mmol/L   Potassium 3.8 3.5 - 5.1 mmol/L   Chloride 103 98 - 111 mmol/L   CO2 25 22 - 32 mmol/L   Glucose, Bld 109 (H) 70 - 99 mg/dL    Comment: Glucose reference range applies only to samples taken after fasting for at least 8 hours.   BUN 23 8 - 23 mg/dL   Creatinine, Ser 9.52 0.44 - 1.00 mg/dL   Calcium  9.1 8.9 - 10.3 mg/dL   GFR, Estimated 57 (L) >60 mL/min    Comment: (NOTE) Calculated using the CKD-EPI Creatinine Equation (2021)    Anion gap 10 5 - 15    Comment: Performed at [facility], [address] [place], [place] [address]  Troponin I (High Sensitivity)     Status: None   Collection Time: 04/03/24  2:04 PM  Result Value Ref Range   Troponin I (High Sensitivity) 5 <18 ng/L    Comment: (NOTE) Elevated high sensitivity troponin I (hsTnI) values and significant  changes across serial measurements may suggest ACS but many other  chronic and acute conditions are known to elevate hsTnI results.  Refer to the "Links" section for chest pain algorithms and additional  guidance. Performed at [facility], [address], [place], [place] [address]   Magnesium      Status: None   Collection Time: 04/03/24  2:04 PM  Result Value Ref Range   Magnesium  2.0 1.7 - 2.4 mg/dL    Comment: Performed at [facility], [address], [place], [place] [address]  Phosphorus     Status: None   Collection Time: 04/03/24  2:04 PM  Result Value Ref Range   Phosphorus 3.9 2.5 - 4.6 mg/dL    Comment: Performed at [facility], [address] [place], [place] [address]  CBC     Status: Abnormal   Collection Time: 04/04/24  4:20 AM  Result Value Ref Range   WBC 13.1 (H) 4.0 - 10.5 K/uL   RBC 3.52 (L) 3.87 - 5.11 MIL/uL   Hemoglobin 10.4 (L)  12.0 - 15.0 g/dL   HCT 16.1 (L) 09.6 - 04.5 %   MCV 93.5 80.0 - 100.0 fL   MCH 29.5 26.0 - 34.0 pg   MCHC 31.6 30.0 - 36.0 g/dL   RDW 40.9 81.1 - 91.4 %   Platelets 248 150 - 400 K/uL   nRBC 0.0 0.0 - 0.2 %    Comment: Performed at [facility], [address], [place], [place] [address]  Comprehensive metabolic panel     Status: Abnormal   Collection Time: 04/04/24  4:20 AM  Result Value Ref Range   Sodium 139 135 - 145 mmol/L   Potassium 4.5 3.5 - 5.1 mmol/L   Chloride 103 98 - 111 mmol/L   CO2 25 22 - 32 mmol/L   Glucose, Bld 137 (H) 70 - 99 mg/dL    Comment: Glucose reference range applies only to samples taken after fasting for at least 8 hours.   BUN 26 (H) 8 - 23 mg/dL   Creatinine, Ser 6.21 (H) 0.44 - 1.00 mg/dL   Calcium  9.2 8.9 - 10.3 mg/dL   Total Protein 5.1 (L) 6.5 - 8.1 g/dL   Albumin  3.1 (L) 3.5 - 5.0 g/dL   AST 24 15 - 41 U/L   ALT 15 0 - 44 U/L   Alkaline Phosphatase 37 (L) 38 - 126 U/L   Total Bilirubin 0.7 0.0 - 1.2 mg/dL   GFR, Estimated 22 (L) >60 mL/min    Comment: (NOTE) Calculated using the CKD-EPI Creatinine Equation (2021)    Anion gap 11 5 - 15    Comment: Performed at [facility], [address] [place], [place] [address]    ECHOCARDIOGRAM COMPLETE Result Date: 04/03/2024    ECHOCARDIOGRAM REPORT   Patient Name:   LARAINE MARGIOTTA Date of Exam: 04/03/2024 Medical Rec #:  [id number]        Height:       70.0 in Accession #:    [id number]       Weight:       215.0 lb Date of Birth:  10-15-1930        BSA:          2.152 m Patient Age:    89 years         BP:           92/55 mmHg Patient Gender: F                HR:           114 bpm. Exam Location:  Inpatient Procedure: 2D Echo, Cardiac Doppler and Color Doppler (Both Spectral  and Color            Flow Doppler were utilized during procedure). Indications:    Arrhythmia  History:        Patient has no prior history of Echocardiogram examinations.                 Signs/Symptoms:Shortness of Breath; Risk Factors:Hypertension.  Sonographer:    Astrid Blamer Referring Phys: [id number] DAVID MANUEL ORTIZ IMPRESSIONS  1. Intracavitary gradient. Peak velocity 2.48 m/s. Peak gradient 24.7 mmHg. Left ventricular ejection fraction, by estimation, is 60 to 65%. The left ventricle has normal function. The left ventricle has no regional wall motion abnormalities. There is moderate asymmetric left ventricular hypertrophy of the septal segment. Left ventricular diastolic function could not be evaluated.  2. Right ventricular systolic function is normal. The right ventricular size is  normal.  3. Left atrial size was moderately dilated.  4. The mitral valve is normal in structure. Trivial mitral valve regurgitation. No evidence of mitral stenosis.  5. The aortic valve is tricuspid. There is mild calcification of the aortic valve. There is mild thickening of the aortic valve. Aortic valve regurgitation is not visualized. No aortic stenosis is present. FINDINGS  Left Ventricle: Intracavitary gradient. Peak velocity 2.48 m/s. Peak gradient 24.7 mmHg. Left ventricular ejection fraction, by estimation, is 60 to 65%. The left ventricle has normal function. The left ventricle has no regional wall motion abnormalities. The left ventricular internal cavity size was normal in size. There is moderate asymmetric left ventricular hypertrophy of the septal segment. Left ventricular diastolic function could not be evaluated due to atrial fibrillation. Left ventricular diastolic function could not be evaluated. Indeterminate filling pressures. Right Ventricle: The right ventricular size is normal. No increase in right ventricular wall thickness. Right ventricular systolic function is normal. Left Atrium: Left atrial size was  moderately dilated. Right Atrium: Right atrial size was normal in size. Pericardium: There is no evidence of pericardial effusion. Mitral Valve: The mitral valve is normal in structure. Trivial mitral valve regurgitation. No evidence of mitral valve stenosis. Tricuspid Valve: The tricuspid valve is normal in structure. Tricuspid valve regurgitation is trivial. No evidence of tricuspid stenosis. Aortic Valve: The aortic valve is tricuspid. There is mild calcification of the aortic valve. There is mild thickening of the aortic valve. Aortic valve regurgitation is not visualized. No aortic stenosis is present. Aortic valve peak gradient measures 8.8 mmHg. Pulmonic Valve: The pulmonic valve was normal in structure. Pulmonic valve regurgitation is not visualized. No evidence of pulmonic stenosis. Aorta: The aortic root is normal in size and structure. Venous: The inferior vena cava was not well visualized. IAS/Shunts: No atrial level shunt detected by color flow Doppler.  LEFT VENTRICLE PLAX 2D LVIDd:         3.80 cm   Diastology LVIDs:         2.20 cm   LV e' medial:    8.16 cm/s LV PW:         0.90 cm   LV E/e' medial:  12.1 LV IVS:        1.40 cm   LV e' lateral:   11.20 cm/s LVOT diam:     1.80 cm   LV E/e' lateral: 8.8 LV SV:         47 LV SV Index:   22 LVOT Area:     2.54 cm  RIGHT VENTRICLE RV S prime:     11.70 cm/s TAPSE (M-mode): 1.5 cm LEFT ATRIUM           Index        RIGHT ATRIUM           Index LA Vol (A4C): 85.1 ml 39.54 ml/m  RA Area:     10.20 cm                                    RA Volume:   19.90 ml  9.25 ml/m  AORTIC VALVE AV Area (Vmax): 2.34 cm AV Vmax:        148.00 cm/s AV Peak Grad:   8.8 mmHg LVOT Vmax:      136.00 cm/s LVOT Vmean:     99.100 cm/s LVOT VTI:       0.183 m  AORTA Ao Root diam: 3.30 cm Ao Asc diam:  3.20 cm MITRAL VALVE               TRICUSPID VALVE MV Area (PHT): 3.10 cm    TR Peak grad:   9.6 mmHg MV E velocity: 98.50 cm/s  TR Vmax:        155.00 cm/s                              SHUNTS                            Systemic VTI:  0.18 m                            Systemic Diam: 1.80 cm Maudine Sos MD Electronically signed by Maudine Sos MD Signature Date/Time: 04/03/2024/7:25:16 PM    Final    DG Hip Unilat W or Wo Pelvis 2-3 Views Right Result Date: 04/03/2024 CLINICAL DATA:  Fall.  Shortening rotation of the right leg. EXAM: RIGHT FEMUR 2 VIEWS; DG HIP (WITH OR WITHOUT PELVIS) 2-3V RIGHT; RIGHT KNEE - COMPLETE 4+ VIEW COMPARISON:  Right hip radiographs 07/05/2022. FINDINGS: And oblique fracture of the distal femoral metaphysis is present just above the femoral component of the total knee arthroplasty. The fracture is displaced laterally and slightly posteriorly. Valgus angulation is noted. The right hip arthroplasty is intact. No fractures are present adjacent to the arthroplasty. The knee arthroplasty is also intact. IMPRESSION: 1. Oblique fracture of the distal femoral metaphysis just above the femoral component of the total knee arthroplasty. 2. The right hip and knee arthroplasty are intact. Electronically Signed   By: Audree Leas M.D.   On: 04/03/2024 11:18   DG Knee Complete 4 Views Right Result Date: 04/03/2024 CLINICAL DATA:  Fall.  Shortening rotation of the right leg. EXAM: RIGHT FEMUR 2 VIEWS; DG HIP (WITH OR WITHOUT PELVIS) 2-3V RIGHT; RIGHT KNEE - COMPLETE 4+ VIEW COMPARISON:  Right hip radiographs 07/05/2022. FINDINGS: And oblique fracture of the distal femoral metaphysis is present just above the femoral component of the total knee arthroplasty. The fracture is displaced laterally and slightly posteriorly. Valgus angulation is noted. The right hip arthroplasty is intact. No fractures are present adjacent to the arthroplasty. The knee arthroplasty is also intact. IMPRESSION: 1. Oblique fracture of the distal femoral metaphysis just above the femoral component of the total knee arthroplasty. 2. The right hip and knee arthroplasty are intact.  Electronically Signed   By: Audree Leas M.D.   On: 04/03/2024 11:18   DG Femur Min 2 Views Right Result Date: 04/03/2024 CLINICAL DATA:  Fall.  Shortening rotation of the right leg. EXAM: RIGHT FEMUR 2 VIEWS; DG HIP (WITH OR WITHOUT PELVIS) 2-3V RIGHT; RIGHT KNEE - COMPLETE 4+ VIEW COMPARISON:  Right hip radiographs 07/05/2022. FINDINGS: And oblique fracture of the distal femoral metaphysis is present just above the femoral component of the total knee arthroplasty. The fracture is displaced laterally and slightly posteriorly. Valgus angulation is noted. The right hip arthroplasty is intact. No fractures are present adjacent to the arthroplasty. The knee arthroplasty is also intact. IMPRESSION: 1. Oblique fracture of the distal femoral metaphysis just above the femoral component of the total knee arthroplasty. 2. The right hip and knee arthroplasty are intact. Electronically Signed   By: Marylouise Socks.D.  On: 04/03/2024 11:18   DG Chest Portable 1 View Result Date: 04/03/2024 CLINICAL DATA:  Marvell Slider. EXAM: PORTABLE CHEST 1 VIEW COMPARISON:  08/15/2022 FINDINGS: Normal sized heart. Tortuous and partially calcified thoracic aorta. Clear lungs with normal vascularity. No fracture or pneumothorax seen. Mild-to-moderate right glenohumeral degenerative changes. IMPRESSION: No acute abnormality. Electronically Signed   By: Catherin Closs M.D.   On: 04/03/2024 11:17    Intake/Output    None      ROS No recent fever, bleeding abnormalities, urologic dysfunction, GI problems, or weight gain.  Blood pressure (!) 128/55, pulse 86, temperature 98.3 F (36.8 C), temperature source Oral, resp. rate 16, height 5\' 10"  (1.778 m), weight 97.5 kg, SpO2 97%. Physical Exam NCAT RRR No audible wheezing or chest retractions Abd soft RLE Buck's traction in place  Edema/ swelling controlled  Sens: DPN, SPN, TN intact  Motor: EHL, FHL, and lessor toe ext and flex all intact grossly  DP 2+, PT 2+, Brisk  cap refill, warm to touch Gait: could not observe Coordination and balance: could not observe   Assessment/Plan:  Right femoral shaft fracture, periprosthetic The risks and benefits of right femur repair were discussed with the patient, including the possibility of infection, nerve injury, vessel injury, wound breakdown, arthritis, symptomatic hardware, DVT/ PE, loss of motion, malunion, nonunion, and need for further surgery among others. These risks were acknowledged and consent was provided to proceed.  Weightbearing: NWB RLE Insicional and dressing care: Reinforce dressings as needed Orthopedic device(s): None Showering: yes VTE prophylaxis: Aspirin  81mg  qd and Lovenox  40mg  qd  latter for six weeks Pain control: Norco Follow - up plan: 2 weeks Contact information:  Hardy Lia MD, [name] PA   Hardy Lia, MD [facility], [facility] [phone]  04/04/2024, 7:54 AM  [facility] [address] [place] [address] [phone] [name] (F)    After 5pm and on the weekends please log on to Amion, go to orthopaedics and the look under the [facility] Call for the provider(s) on call. You can also call our office at [phone] and then follow the prompts to be connected to the call team.

## 2024-04-04 NOTE — Progress Notes (Signed)
 Patient valuables retrieved from security and returned to patient.  Patient signed acknowledgement of property. Patient advised to send valuables home with family.

## 2024-04-04 NOTE — TOC CAGE-AID Note (Signed)
 Transition of Care [facility]) - CAGE-AID Screening  Patient Details  Name: [name] MRN: [id number] Date of Birth: 1930-05-19  Clinical Narrative:  Patient denies any alcohol or drug use, substance abuse resources not provided at this time.  CAGE-AID Screening:   Have You Ever Felt You Ought to Cut Down on Your Drinking or Drug Use?: No Have People Annoyed You By Critizing Your Drinking Or Drug Use?: No Have You Felt Bad Or Guilty About Your Drinking Or Drug Use?: No Have You Ever Had a Drink or Used Drugs First Thing In The Morning to Steady Your Nerves or to Get Rid of a Hangover?: No CAGE-AID Score: 0  Substance Abuse Education Offered: No

## 2024-04-04 NOTE — Progress Notes (Addendum)
 PROGRESS NOTE    [name]  ZOX:[id number] DOB: June 16, 1930 DOA: 04/03/2024 PCP: Pcp, No  89/F w anemia,depression, fibromyalgia, GERD, hypertension, internal hemorrhoids, osteoarthritis, peripheral neuropathy, ulcerative colitis who presented to ED following a mechanical fall while trying to get out of bed to reach her phone, subsequently injured right lower leg, in the ED she went into A-fib with RVR and then converted to sinus rhythm on diltiazem . -Workup noted hemoglobin 11.9 creatinine 0.8, x-ray hip/pelvis noted oblique fracture of distal femoral metaphysis.  Subjective: -Patient seen in preop, feels okay, denies pain as long as she stays still, remains in sinus rhythm and stable  Assessment and Plan:  Closed displaced oblique fracture of shaft of right femur (HCC) - Orthopedics consulting, plan for surgery today -May need rehab postop -Start anticoagulation for A-fib after surgery    Atrial fibrillation with RVR (HCC) -Transient, converted to sinus rhythm -Changed to oral diltiazem  -Start DOAC tomorrow if Hb stable -Follow-up 2D echo, will check TSH -d/w Cards, they arranged FU 5/8 at 9am     Depression Continue sertraline  50 mg p.o. daily. Continue trazodone  at 150 mg p.o. nightly.     Essential hypertension Now on Cardizem , hold lisinopril/HCTZ     GERD Continue pantoprazole  40 mg p.o. daily.     Normocytic anemia Monitor hematocrit and hemoglobin.      Advance Care Planning:   Code Status: Full Code    DVT prophylaxis: Add anticoagulation tomorrow Code Status: Full code Family Communication: None present Disposition Plan: May need rehab  Consultants: Orthopedics   Procedures:   Antimicrobials:    Objective: Vitals:   04/03/24 2013 04/03/24 2343 04/04/24 0400 04/04/24 0711  BP: (!) 104/53 100/73 (!) 101/55 (!) 128/55  Pulse: 89 82 82 86  Resp: 20 15 18 16   Temp: 98.1 F (36.7 C) 98.1 F (36.7 C) 98.8 F (37.1 C) 98.3 F (36.8 C)   TempSrc: Oral Oral Oral Oral  SpO2: 96% 100% 99% 97%  Weight:      Height:        Intake/Output Summary (Last 24 hours) at 04/04/2024 0936 Last data filed at 04/04/2024 0933 Gross per 24 hour  Intake 1450 ml  Output 50 ml  Net 1400 ml   Filed Weights   04/03/24 1034  Weight: 97.5 kg    Examination:  General exam: Appears calm and comfortable  Respiratory system: Decreased breath sounds at the bases Cardiovascular system: S1 & S2 heard, RRR.  Abd: nondistended, soft and nontender.Normal bowel sounds heard. Extremities: Right leg in Buck's traction Skin: No rashes Psychiatry:  Mood & affect appropriate.     Data Reviewed:   CBC: Recent Labs  Lab 04/03/24 1101 04/04/24 0420  WBC 11.1* 13.1*  HGB 11.9* 10.4*  HCT 38.3 32.9*  MCV 94.1 93.5  PLT 212 248   Basic Metabolic Panel: Recent Labs  Lab 04/03/24 1101 04/03/24 1404 04/04/24 0420  NA 138  --  139  K 3.8  --  4.5  CL 103  --  103  CO2 25  --  25  GLUCOSE 109*  --  137*  BUN 23  --  26*  CREATININE 0.95  --  2.08*  CALCIUM  9.1  --  9.2  MG  --  2.0  --   PHOS  --  3.9  --    GFR: Estimated Creatinine Clearance: 22.3 mL/min (A) (by C-G formula based on SCr of 2.08 mg/dL (H)). Liver Function Tests: Recent Labs  Lab 04/04/24  0420  AST 24  ALT 15  ALKPHOS 37*  BILITOT 0.7  PROT 5.1*  ALBUMIN  3.1*   No results for input(s): "LIPASE", "AMYLASE" in the last 168 hours. No results for input(s): "AMMONIA" in the last 168 hours. Coagulation Profile: No results for input(s): "INR", "PROTIME" in the last 168 hours. Cardiac Enzymes: No results for input(s): "CKTOTAL", "CKMB", "CKMBINDEX", "TROPONINI" in the last 168 hours. BNP (last 3 results) No results for input(s): "PROBNP" in the last 8760 hours. HbA1C: No results for input(s): "HGBA1C" in the last 72 hours. CBG: No results for input(s): "GLUCAP" in the last 168 hours. Lipid Profile: No results for input(s): "CHOL", "HDL", "LDLCALC", "TRIG",  "CHOLHDL", "LDLDIRECT" in the last 72 hours. Thyroid Function Tests: No results for input(s): "TSH", "T4TOTAL", "FREET4", "T3FREE", "THYROIDAB" in the last 72 hours. Anemia Panel: No results for input(s): "VITAMINB12", "FOLATE", "FERRITIN", "TIBC", "IRON", "RETICCTPCT" in the last 72 hours. Urine analysis:    Component Value Date/Time   COLORURINE YELLOW 08/15/2022 1825   APPEARANCEUR CLEAR 08/15/2022 1825   LABSPEC 1.014 08/15/2022 1825   PHURINE 5.0 08/15/2022 1825   GLUCOSEU NEGATIVE 08/15/2022 1825   HGBUR NEGATIVE 08/15/2022 1825   BILIRUBINUR NEGATIVE 08/15/2022 1825   KETONESUR NEGATIVE 08/15/2022 1825   PROTEINUR NEGATIVE 08/15/2022 1825   UROBILINOGEN 0.2 12/11/2014 1120   NITRITE NEGATIVE 08/15/2022 1825   LEUKOCYTESUR SMALL (A) 08/15/2022 1825   Sepsis Labs: @LABRCNTIP (procalcitonin:4,lacticidven:4)  )No results found for this or any previous visit (from the past 240 hours).   Radiology Studies: ECHOCARDIOGRAM COMPLETE Result Date: 04/03/2024    ECHOCARDIOGRAM REPORT   Patient Name:   [name] Date of Exam: 04/03/2024 Medical Rec #:  [id number]        Height:       70.0 in Accession #:    [id number]       Weight:       215.0 lb Date of Birth:  09-22-1930        BSA:          2.152 m Patient Age:    89 years         BP:           92/55 mmHg Patient Gender: F                HR:           114 bpm. Exam Location:  Inpatient Procedure: 2D Echo, Cardiac Doppler and Color Doppler (Both Spectral and Color            Flow Doppler were utilized during procedure). Indications:    Arrhythmia  History:        Patient has no prior history of Echocardiogram examinations.                 Signs/Symptoms:Shortness of Breath; Risk Factors:Hypertension.  Sonographer:    Astrid Blamer Referring Phys: [id number] DAVID MANUEL ORTIZ IMPRESSIONS  1. Intracavitary gradient. Peak velocity 2.48 m/s. Peak gradient 24.7 mmHg. Left ventricular ejection fraction, by estimation, is 60 to 65%. The left  ventricle has normal function. The left ventricle has no regional wall motion abnormalities. There is moderate asymmetric left ventricular hypertrophy of the septal segment. Left ventricular diastolic function could not be evaluated.  2. Right ventricular systolic function is normal. The right ventricular size is normal.  3. Left atrial size was moderately dilated.  4. The mitral valve is normal in structure. Trivial mitral valve regurgitation. No evidence of mitral stenosis.  5.  The aortic valve is tricuspid. There is mild calcification of the aortic valve. There is mild thickening of the aortic valve. Aortic valve regurgitation is not visualized. No aortic stenosis is present. FINDINGS  Left Ventricle: Intracavitary gradient. Peak velocity 2.48 m/s. Peak gradient 24.7 mmHg. Left ventricular ejection fraction, by estimation, is 60 to 65%. The left ventricle has normal function. The left ventricle has no regional wall motion abnormalities. The left ventricular internal cavity size was normal in size. There is moderate asymmetric left ventricular hypertrophy of the septal segment. Left ventricular diastolic function could not be evaluated due to atrial fibrillation. Left ventricular diastolic function could not be evaluated. Indeterminate filling pressures. Right Ventricle: The right ventricular size is normal. No increase in right ventricular wall thickness. Right ventricular systolic function is normal. Left Atrium: Left atrial size was moderately dilated. Right Atrium: Right atrial size was normal in size. Pericardium: There is no evidence of pericardial effusion. Mitral Valve: The mitral valve is normal in structure. Trivial mitral valve regurgitation. No evidence of mitral valve stenosis. Tricuspid Valve: The tricuspid valve is normal in structure. Tricuspid valve regurgitation is trivial. No evidence of tricuspid stenosis. Aortic Valve: The aortic valve is tricuspid. There is mild calcification of the aortic  valve. There is mild thickening of the aortic valve. Aortic valve regurgitation is not visualized. No aortic stenosis is present. Aortic valve peak gradient measures 8.8 mmHg. Pulmonic Valve: The pulmonic valve was normal in structure. Pulmonic valve regurgitation is not visualized. No evidence of pulmonic stenosis. Aorta: The aortic root is normal in size and structure. Venous: The inferior vena cava was not well visualized. IAS/Shunts: No atrial level shunt detected by color flow Doppler.  LEFT VENTRICLE PLAX 2D LVIDd:         3.80 cm   Diastology LVIDs:         2.20 cm   LV e' medial:    8.16 cm/s LV PW:         0.90 cm   LV E/e' medial:  12.1 LV IVS:        1.40 cm   LV e' lateral:   11.20 cm/s LVOT diam:     1.80 cm   LV E/e' lateral: 8.8 LV SV:         47 LV SV Index:   22 LVOT Area:     2.54 cm  RIGHT VENTRICLE RV S prime:     11.70 cm/s TAPSE (M-mode): 1.5 cm LEFT ATRIUM           Index        RIGHT ATRIUM           Index LA Vol (A4C): 85.1 ml 39.54 ml/m  RA Area:     10.20 cm                                    RA Volume:   19.90 ml  9.25 ml/m  AORTIC VALVE AV Area (Vmax): 2.34 cm AV Vmax:        148.00 cm/s AV Peak Grad:   8.8 mmHg LVOT Vmax:      136.00 cm/s LVOT Vmean:     99.100 cm/s LVOT VTI:       0.183 m  AORTA Ao Root diam: 3.30 cm Ao Asc diam:  3.20 cm MITRAL VALVE               TRICUSPID  VALVE MV Area (PHT): 3.10 cm    TR Peak grad:   9.6 mmHg MV E velocity: 98.50 cm/s  TR Vmax:        155.00 cm/s                             SHUNTS                            Systemic VTI:  0.18 m                            Systemic Diam: 1.80 cm Maudine Sos MD Electronically signed by Maudine Sos MD Signature Date/Time: 04/03/2024/7:25:16 PM    Final    DG Hip Unilat W or Wo Pelvis 2-3 Views Right Result Date: 04/03/2024 CLINICAL DATA:  Fall.  Shortening rotation of the right leg. EXAM: RIGHT FEMUR 2 VIEWS; DG HIP (WITH OR WITHOUT PELVIS) 2-3V RIGHT; RIGHT KNEE - COMPLETE 4+ VIEW COMPARISON:   Right hip radiographs 07/05/2022. FINDINGS: And oblique fracture of the distal femoral metaphysis is present just above the femoral component of the total knee arthroplasty. The fracture is displaced laterally and slightly posteriorly. Valgus angulation is noted. The right hip arthroplasty is intact. No fractures are present adjacent to the arthroplasty. The knee arthroplasty is also intact. IMPRESSION: 1. Oblique fracture of the distal femoral metaphysis just above the femoral component of the total knee arthroplasty. 2. The right hip and knee arthroplasty are intact. Electronically Signed   By: Audree Leas M.D.   On: 04/03/2024 11:18   DG Knee Complete 4 Views Right Result Date: 04/03/2024 CLINICAL DATA:  Fall.  Shortening rotation of the right leg. EXAM: RIGHT FEMUR 2 VIEWS; DG HIP (WITH OR WITHOUT PELVIS) 2-3V RIGHT; RIGHT KNEE - COMPLETE 4+ VIEW COMPARISON:  Right hip radiographs 07/05/2022. FINDINGS: And oblique fracture of the distal femoral metaphysis is present just above the femoral component of the total knee arthroplasty. The fracture is displaced laterally and slightly posteriorly. Valgus angulation is noted. The right hip arthroplasty is intact. No fractures are present adjacent to the arthroplasty. The knee arthroplasty is also intact. IMPRESSION: 1. Oblique fracture of the distal femoral metaphysis just above the femoral component of the total knee arthroplasty. 2. The right hip and knee arthroplasty are intact. Electronically Signed   By: Audree Leas M.D.   On: 04/03/2024 11:18   DG Femur Min 2 Views Right Result Date: 04/03/2024 CLINICAL DATA:  Fall.  Shortening rotation of the right leg. EXAM: RIGHT FEMUR 2 VIEWS; DG HIP (WITH OR WITHOUT PELVIS) 2-3V RIGHT; RIGHT KNEE - COMPLETE 4+ VIEW COMPARISON:  Right hip radiographs 07/05/2022. FINDINGS: And oblique fracture of the distal femoral metaphysis is present just above the femoral component of the total knee arthroplasty. The  fracture is displaced laterally and slightly posteriorly. Valgus angulation is noted. The right hip arthroplasty is intact. No fractures are present adjacent to the arthroplasty. The knee arthroplasty is also intact. IMPRESSION: 1. Oblique fracture of the distal femoral metaphysis just above the femoral component of the total knee arthroplasty. 2. The right hip and knee arthroplasty are intact. Electronically Signed   By: Audree Leas M.D.   On: 04/03/2024 11:18   DG Chest Portable 1 View Result Date: 04/03/2024 CLINICAL DATA:  Marvell Slider. EXAM: PORTABLE CHEST 1 VIEW COMPARISON:  08/15/2022 FINDINGS: Normal sized  heart. Tortuous and partially calcified thoracic aorta. Clear lungs with normal vascularity. No fracture or pneumothorax seen. Mild-to-moderate right glenohumeral degenerative changes. IMPRESSION: No acute abnormality. Electronically Signed   By: Catherin Closs M.D.   On: 04/03/2024 11:17     Scheduled Meds:  [MAR Hold] aspirin  EC  81 mg Oral Daily   [MAR Hold] diltiazem   120 mg Oral Daily   [MAR Hold] docusate sodium   100 mg Oral Daily   [MAR Hold] dorzolamide -timolol   1 drop Both Eyes BID   [MAR Hold] gabapentin   400 mg Oral Daily   [MAR Hold] latanoprost   1 drop Both Eyes QHS   [MAR Hold] pantoprazole   40 mg Oral Daily   [MAR Hold] sertraline   50 mg Oral Daily   [MAR Hold] traZODone   150 mg Oral QHS   Continuous Infusions:  diltiazem  (CARDIZEM ) infusion Stopped (04/03/24 1710)     LOS: 1 day    Time spent:    Deforest Fast, MD [facility]   04/04/2024, 9:36 AM

## 2024-04-04 NOTE — Transfer of Care (Signed)
 Immediate Anesthesia Transfer of Care Note  Patient: [name]  Procedure(s) Performed: OPEN REDUCTION INTERNAL FIXATION RIGHT FEMUR FRACTURE (Right: Leg Upper)  Patient Location: PACU  Anesthesia Type:General  Level of Consciousness: awake, alert , and oriented  Airway & Oxygen Therapy: Patient Spontanous Breathing and Patient connected to face mask oxygen  Post-op Assessment: Report given to RN and Post -op Vital signs reviewed and stable  Post vital signs: Reviewed and stable  Last Vitals:  Vitals Value Taken Time  BP 121/91 04/04/24 1055  Temp    Pulse 98 04/04/24 1059  Resp 26 04/04/24 1058  SpO2 89 % 04/04/24 1059  Vitals shown include unfiled device data.  Last Pain:  Vitals:   04/04/24 0737  TempSrc:   PainSc: 3       Patients Stated Pain Goal: 0 (04/03/24 1810)  Complications: No notable events documented.

## 2024-04-05 DIAGNOSIS — S72401A Unspecified fracture of lower end of right femur, initial encounter for closed fracture: Secondary | ICD-10-CM | POA: Diagnosis not present

## 2024-04-05 LAB — CBC
HCT: 26.2 % — ABNORMAL LOW (ref 36.0–46.0)
Hemoglobin: 8.5 g/dL — ABNORMAL LOW (ref 12.0–15.0)
MCH: 29.7 pg (ref 26.0–34.0)
MCHC: 32.4 g/dL (ref 30.0–36.0)
MCV: 91.6 fL (ref 80.0–100.0)
Platelets: 171 10*3/uL (ref 150–400)
RBC: 2.86 MIL/uL — ABNORMAL LOW (ref 3.87–5.11)
RDW: 14.3 % (ref 11.5–15.5)
WBC: 13.8 10*3/uL — ABNORMAL HIGH (ref 4.0–10.5)
nRBC: 0 % (ref 0.0–0.2)

## 2024-04-05 LAB — BASIC METABOLIC PANEL WITH GFR
Anion gap: 8 (ref 5–15)
BUN: 24 mg/dL — ABNORMAL HIGH (ref 8–23)
CO2: 26 mmol/L (ref 22–32)
Calcium: 8.8 mg/dL — ABNORMAL LOW (ref 8.9–10.3)
Chloride: 104 mmol/L (ref 98–111)
Creatinine, Ser: 1.15 mg/dL — ABNORMAL HIGH (ref 0.44–1.00)
GFR, Estimated: 45 mL/min — ABNORMAL LOW (ref >60)
Glucose, Bld: 137 mg/dL — ABNORMAL HIGH (ref 70–99)
Potassium: 4.9 mmol/L (ref 3.5–5.1)
Sodium: 138 mmol/L (ref 135–145)

## 2024-04-05 LAB — TSH: TSH: 0.689 u[IU]/mL (ref 0.350–4.500)

## 2024-04-05 LAB — VITAMIN D 25 HYDROXY (VIT D DEFICIENCY, FRACTURES): Vit D, 25-Hydroxy: 82.94 ng/mL (ref 30–100)

## 2024-04-05 MED ORDER — LACTATED RINGERS IV SOLN: INTRAVENOUS | Status: DC

## 2024-04-05 MED ORDER — OXYCODONE HCL 5 MG PO TABS
5.0000 mg | ORAL_TABLET | Freq: Four times a day (QID) | ORAL | Status: DC | PRN
  Administered 2024-04-05 – 2024-04-06 (×2): 5 mg via ORAL
  Filled 2024-04-05 (×2): qty 1

## 2024-04-05 MED ORDER — POLYETHYLENE GLYCOL 3350 17 G PO PACK
17.0000 g | PACK | Freq: Every day | ORAL | Status: DC
  Administered 2024-04-05 – 2024-04-06 (×2): 17 g via ORAL
  Filled 2024-04-05 (×3): qty 1

## 2024-04-05 MED ORDER — FERROUS SULFATE 325 (65 FE) MG PO TABS
325.0000 mg | ORAL_TABLET | Freq: Every day | ORAL | Status: DC
  Administered 2024-04-05 – 2024-04-09 (×5): 325 mg via ORAL
  Filled 2024-04-05 (×5): qty 1

## 2024-04-05 MED ORDER — SENNA 8.6 MG PO TABS
1.0000 | ORAL_TABLET | Freq: Every day | ORAL | Status: DC
  Administered 2024-04-05 – 2024-04-07 (×3): 8.6 mg via ORAL
  Filled 2024-04-05 (×4): qty 1

## 2024-04-05 NOTE — Progress Notes (Signed)
 PROGRESS NOTE    [name]  WGN:[id number] DOB: Mar 22, 1930 DOA: 04/03/2024 PCP: Pcp, No   Hospital course, HPI 89 year old past medical history significant for anemia, depression, fibromyalgia, GERD, hypertension, internal hemorrhoids, osteoarthritis, peripheral neuropathy, ulcerative colitis presented to the ED following a mechanical fall, subsequently injured her right lower leg.  In the ED she was found to be in A-fib with RVR and did converted to sinus rhythm on diltiazem .  X-ray showed hip pelvis oblique fracture of the distal femoral metaphysis.  Orthopedic consulted and patient underwent open reduction internal fixation of the right supracondylar femur fracture using a [employer] Plate.    Subjective: Patient is alert, she denies any pain, denies dyspnea.  Assessment and Plan: Right supracondylar periprosthetic distal femur fracture below total hip arthroplasty; - Orthopedics consulted.  -Patient underwent reduction internal fixation of the right supracondylar femur fracture using a [employer] plate -Start anticoagulation for A-fib when okay by Ortho -Will benefit from rehab   Atrial fibrillation with RVR (HCC) -Transient, converted to sinus rhythm -Changed to oral diltiazem  -Start DOAC when okay by orthopedic -d/w Cards, they arranged FU 5/8 at 9am  -2D echo: Ejection fraction, TSH normal 0.689  AKI;  -post op , in setting hypotension.  -check bladder scan.  - Titrate urine output, creatinine appears to have improved this morning down to 1.1 from 2 yesterday We will give IV fluid for 4 hours.  Acute Blood loss Anemia:  - Postsurgery expected hemoglobin on admission 11 down to 8.5 today.  Will start iron supplement.  Plan to monitor for now. Of none clinical significance so far  Depression Continue sertraline  Continue trazodone     Essential hypertension Now on Cardizem , hold lisinopril/hydrochlorothiazide  in setting AKI Holder parameters for Cardizem   to avoid hypotension  GERD Continue pantoprazole  40 mg p.o. daily.   Normocytic anemia Monitor hematocrit and hemoglobin.      Advance Care Planning:   Code Status: Full Code    DVT prophylaxis:  Code Status: Full code Family Communication: no family at bedside.  Disposition Plan: May need rehab  Consultants: Orthopedics   Procedures:   Antimicrobials:    Objective: Vitals:   04/04/24 1432 04/04/24 1502 04/04/24 1928 04/05/24 0407  BP: (!) 121/58 (!) 94/59 (!) 123/57 (!) 104/49  Pulse: 79 84 82 71  Resp: (!) 22 15 13 15   Temp:   98.2 F (36.8 C) 98 F (36.7 C)  TempSrc:   Oral Oral  SpO2: 97% 98% 92% 100%  Weight:      Height:        Intake/Output Summary (Last 24 hours) at 04/05/2024 0743 Last data filed at 04/04/2024 1500 Gross per 24 hour  Intake 1890 ml  Output 100 ml  Net 1790 ml   Filed Weights   04/03/24 1034  Weight: 97.5 kg    Examination:  General exam: NAD Respiratory system: CTA Cardiovascular system:S 1, S 2 RRR Abd: BS present, soft, nt Extremities: no edema, right extremities     Data Reviewed:   CBC: Recent Labs  Lab 04/03/24 1101 04/04/24 0420 04/04/24 1301 04/05/24 0609  WBC 11.1* 13.1* 14.6* 13.8*  HGB 11.9* 10.4* 9.6* 8.5*  HCT 38.3 32.9* 30.5* 26.2*  MCV 94.1 93.5 93.6 91.6  PLT 212 248 189 171   Basic Metabolic Panel: Recent Labs  Lab 04/03/24 1101 04/03/24 1404 04/04/24 0420 04/04/24 1301  NA 138  --  139  --   K 3.8  --  4.5  --  CL 103  --  103  --   CO2 25  --  25  --   GLUCOSE 109*  --  137*  --   BUN 23  --  26*  --   CREATININE 0.95  --  2.08* 1.91*  CALCIUM  9.1  --  9.2  --   MG  --  2.0  --   --   PHOS  --  3.9  --   --    GFR: Estimated Creatinine Clearance: 24.3 mL/min (A) (by C-G formula based on SCr of 1.91 mg/dL (H)). Liver Function Tests: Recent Labs  Lab 04/04/24 0420  AST 24  ALT 15  ALKPHOS 37*  BILITOT 0.7  PROT 5.1*  ALBUMIN  3.1*   No results for input(s): "LIPASE",  "AMYLASE" in the last 168 hours. No results for input(s): "AMMONIA" in the last 168 hours. Coagulation Profile: No results for input(s): "INR", "PROTIME" in the last 168 hours. Cardiac Enzymes: No results for input(s): "CKTOTAL", "CKMB", "CKMBINDEX", "TROPONINI" in the last 168 hours. BNP (last 3 results) No results for input(s): "PROBNP" in the last 8760 hours. HbA1C: No results for input(s): "HGBA1C" in the last 72 hours. CBG: No results for input(s): "GLUCAP" in the last 168 hours. Lipid Profile: No results for input(s): "CHOL", "HDL", "LDLCALC", "TRIG", "CHOLHDL", "LDLDIRECT" in the last 72 hours. Thyroid Function Tests: No results for input(s): "TSH", "T4TOTAL", "FREET4", "T3FREE", "THYROIDAB" in the last 72 hours. Anemia Panel: No results for input(s): "VITAMINB12", "FOLATE", "FERRITIN", "TIBC", "IRON", "RETICCTPCT" in the last 72 hours. Urine analysis:    Component Value Date/Time   COLORURINE YELLOW 08/15/2022 1825   APPEARANCEUR CLEAR 08/15/2022 1825   LABSPEC 1.014 08/15/2022 1825   PHURINE 5.0 08/15/2022 1825   GLUCOSEU NEGATIVE 08/15/2022 1825   HGBUR NEGATIVE 08/15/2022 1825   BILIRUBINUR NEGATIVE 08/15/2022 1825   KETONESUR NEGATIVE 08/15/2022 1825   PROTEINUR NEGATIVE 08/15/2022 1825   UROBILINOGEN 0.2 12/11/2014 1120   NITRITE NEGATIVE 08/15/2022 1825   LEUKOCYTESUR SMALL (A) 08/15/2022 1825   Sepsis Labs: @LABRCNTIP (procalcitonin:4,lacticidven:4)  )No results found for this or any previous visit (from the past 240 hours).   Radiology Studies: DG FEMUR PORT, MIN 2 VIEWS RIGHT Result Date: 04/04/2024 CLINICAL DATA:  Fracture, post ORIF. EXAM: RIGHT FEMUR PORTABLE 2 VIEW COMPARISON:  Preoperative imaging FINDINGS: Lateral plate and screw fixation of femoral shaft fracture. Improved fracture alignment from preoperative imaging. Previous knee and hip arthroplasty. Soft tissue air and edema at the operative site. IMPRESSION: ORIF of femoral shaft fracture.  Electronically Signed   By: Chadwick Colonel M.D.   On: 04/04/2024 13:06   DG FEMUR, MIN 2 VIEWS RIGHT Result Date: 04/04/2024 CLINICAL DATA:  Elective surgery. EXAM: RIGHT FEMUR 2 VIEWS COMPARISON:  Preoperative imaging FINDINGS: Six fluoroscopic spot views of the femur submitted from the operating room. Plate and screw fixation of femoral shaft fracture. Previous knee and hip arthroplasty. Fluoroscopy time 1 minute 24 seconds. Dose 11.99 mGy. IMPRESSION: Intraoperative fluoroscopy during femoral shaft fracture fixation. Electronically Signed   By: Chadwick Colonel M.D.   On: 04/04/2024 11:57   DG C-Arm 1-60 Min-No Report Result Date: 04/04/2024 Fluoroscopy was utilized by the requesting physician.  No radiographic interpretation.   DG C-Arm 1-60 Min-No Report Result Date: 04/04/2024 Fluoroscopy was utilized by the requesting physician.  No radiographic interpretation.   ECHOCARDIOGRAM COMPLETE Result Date: 04/03/2024    ECHOCARDIOGRAM REPORT   Patient Name:   MERVE PELLICER Date of Exam: 04/03/2024 Medical Rec #:  [id number]        Height:       70.0 in Accession #:    [id number]       Weight:       215.0 lb Date of Birth:  [birth year]        BSA:          2.152 m Patient Age:    89 years         BP:           92/55 mmHg Patient Gender: F                HR:           114 bpm. Exam Location:  Inpatient Procedure: 2D Echo, Cardiac Doppler and Color Doppler (Both Spectral and Color            Flow Doppler were utilized during procedure). Indications:    Arrhythmia  History:        Patient has no prior history of Echocardiogram examinations.                 Signs/Symptoms:Shortness of Breath; Risk Factors:Hypertension.  Sonographer:    Astrid Blamer Referring Phys: [id number] DAVID MANUEL ORTIZ IMPRESSIONS  1. Intracavitary gradient. Peak velocity 2.48 m/s. Peak gradient 24.7 mmHg. Left ventricular ejection fraction, by estimation, is 60 to 65%. The left ventricle has normal function. The left ventricle has no  regional wall motion abnormalities. There is moderate asymmetric left ventricular hypertrophy of the septal segment. Left ventricular diastolic function could not be evaluated.  2. Right ventricular systolic function is normal. The right ventricular size is normal.  3. Left atrial size was moderately dilated.  4. The mitral valve is normal in structure. Trivial mitral valve regurgitation. No evidence of mitral stenosis.  5. The aortic valve is tricuspid. There is mild calcification of the aortic valve. There is mild thickening of the aortic valve. Aortic valve regurgitation is not visualized. No aortic stenosis is present. FINDINGS  Left Ventricle: Intracavitary gradient. Peak velocity 2.48 m/s. Peak gradient 24.7 mmHg. Left ventricular ejection fraction, by estimation, is 60 to 65%. The left ventricle has normal function. The left ventricle has no regional wall motion abnormalities. The left ventricular internal cavity size was normal in size. There is moderate asymmetric left ventricular hypertrophy of the septal segment. Left ventricular diastolic function could not be evaluated due to atrial fibrillation. Left ventricular diastolic function could not be evaluated. Indeterminate filling pressures. Right Ventricle: The right ventricular size is normal. No increase in right ventricular wall thickness. Right ventricular systolic function is normal. Left Atrium: Left atrial size was moderately dilated. Right Atrium: Right atrial size was normal in size. Pericardium: There is no evidence of pericardial effusion. Mitral Valve: The mitral valve is normal in structure. Trivial mitral valve regurgitation. No evidence of mitral valve stenosis. Tricuspid Valve: The tricuspid valve is normal in structure. Tricuspid valve regurgitation is trivial. No evidence of tricuspid stenosis. Aortic Valve: The aortic valve is tricuspid. There is mild calcification of the aortic valve. There is mild thickening of the aortic valve. Aortic  valve regurgitation is not visualized. No aortic stenosis is present. Aortic valve peak gradient measures 8.8 mmHg. Pulmonic Valve: The pulmonic valve was normal in structure. Pulmonic valve regurgitation is not visualized. No evidence of pulmonic stenosis. Aorta: The aortic root is normal in size and structure. Venous: The inferior vena cava was not well visualized. IAS/Shunts: No atrial level shunt detected by color flow Doppler.  LEFT VENTRICLE PLAX 2D LVIDd:         3.80 cm   Diastology LVIDs:         2.20 cm   LV e' medial:    8.16 cm/s LV PW:         0.90 cm   LV E/e' medial:  12.1 LV IVS:        1.40 cm   LV e' lateral:   11.20 cm/s LVOT diam:     1.80 cm   LV E/e' lateral: 8.8 LV SV:         47 LV SV Index:   22 LVOT Area:     2.54 cm  RIGHT VENTRICLE RV S prime:     11.70 cm/s TAPSE (M-mode): 1.5 cm LEFT ATRIUM           Index        RIGHT ATRIUM           Index LA Vol (A4C): 85.1 ml 39.54 ml/m  RA Area:     10.20 cm                                    RA Volume:   19.90 ml  9.25 ml/m  AORTIC VALVE AV Area (Vmax): 2.34 cm AV Vmax:        148.00 cm/s AV Peak Grad:   8.8 mmHg LVOT Vmax:      136.00 cm/s LVOT Vmean:     99.100 cm/s LVOT VTI:       0.183 m  AORTA Ao Root diam: 3.30 cm Ao Asc diam:  3.20 cm MITRAL VALVE               TRICUSPID VALVE MV Area (PHT): 3.10 cm    TR Peak grad:   9.6 mmHg MV E velocity: 98.50 cm/s  TR Vmax:        155.00 cm/s                             SHUNTS                            Systemic VTI:  0.18 m                            Systemic Diam: 1.80 cm Maudine Sos MD Electronically signed by Maudine Sos MD Signature Date/Time: 04/03/2024/7:25:16 PM    Final    DG Hip Unilat W or Wo Pelvis 2-3 Views Right Result Date: 04/03/2024 CLINICAL DATA:  Fall.  Shortening rotation of the right leg. EXAM: RIGHT FEMUR 2 VIEWS; DG HIP (WITH OR WITHOUT PELVIS) 2-3V RIGHT; RIGHT KNEE - COMPLETE 4+ VIEW COMPARISON:  Right hip radiographs 07/05/2022. FINDINGS: And oblique fracture  of the distal femoral metaphysis is present just above the femoral component of the total knee arthroplasty. The fracture is displaced laterally and slightly posteriorly. Valgus angulation is noted. The right hip arthroplasty is intact. No fractures are present adjacent to the arthroplasty. The knee arthroplasty is also intact. IMPRESSION: 1. Oblique fracture of the distal femoral metaphysis just above the femoral component of the total knee arthroplasty. 2. The right hip and knee arthroplasty are intact. Electronically Signed   By: Audree Leas M.D.   On: 04/03/2024 11:18  DG Knee Complete 4 Views Right Result Date: 04/03/2024 CLINICAL DATA:  Fall.  Shortening rotation of the right leg. EXAM: RIGHT FEMUR 2 VIEWS; DG HIP (WITH OR WITHOUT PELVIS) 2-3V RIGHT; RIGHT KNEE - COMPLETE 4+ VIEW COMPARISON:  Right hip radiographs 07/05/2022. FINDINGS: And oblique fracture of the distal femoral metaphysis is present just above the femoral component of the total knee arthroplasty. The fracture is displaced laterally and slightly posteriorly. Valgus angulation is noted. The right hip arthroplasty is intact. No fractures are present adjacent to the arthroplasty. The knee arthroplasty is also intact. IMPRESSION: 1. Oblique fracture of the distal femoral metaphysis just above the femoral component of the total knee arthroplasty. 2. The right hip and knee arthroplasty are intact. Electronically Signed   By: Audree Leas M.D.   On: 04/03/2024 11:18   DG Femur Min 2 Views Right Result Date: 04/03/2024 CLINICAL DATA:  Fall.  Shortening rotation of the right leg. EXAM: RIGHT FEMUR 2 VIEWS; DG HIP (WITH OR WITHOUT PELVIS) 2-3V RIGHT; RIGHT KNEE - COMPLETE 4+ VIEW COMPARISON:  Right hip radiographs 07/05/2022. FINDINGS: And oblique fracture of the distal femoral metaphysis is present just above the femoral component of the total knee arthroplasty. The fracture is displaced laterally and slightly posteriorly. Valgus  angulation is noted. The right hip arthroplasty is intact. No fractures are present adjacent to the arthroplasty. The knee arthroplasty is also intact. IMPRESSION: 1. Oblique fracture of the distal femoral metaphysis just above the femoral component of the total knee arthroplasty. 2. The right hip and knee arthroplasty are intact. Electronically Signed   By: Audree Leas M.D.   On: 04/03/2024 11:18   DG Chest Portable 1 View Result Date: 04/03/2024 CLINICAL DATA:  Marvell Slider. EXAM: PORTABLE CHEST 1 VIEW COMPARISON:  08/15/2022 FINDINGS: Normal sized heart. Tortuous and partially calcified thoracic aorta. Clear lungs with normal vascularity. No fracture or pneumothorax seen. Mild-to-moderate right glenohumeral degenerative changes. IMPRESSION: No acute abnormality. Electronically Signed   By: Catherin Closs M.D.   On: 04/03/2024 11:17     Scheduled Meds:  acetaminophen   650 mg Oral Q6H   Or   acetaminophen   650 mg Rectal Q6H   aspirin  EC  81 mg Oral Daily   diltiazem   120 mg Oral Daily   docusate sodium   100 mg Oral Daily   dorzolamide -timolol   1 drop Both Eyes BID   enoxaparin  (LOVENOX ) injection  40 mg Subcutaneous Q24H   feeding supplement  237 mL Oral BID BM   gabapentin   400 mg Oral Daily   latanoprost   1 drop Both Eyes QHS   multivitamin with minerals  1 tablet Oral Daily   pantoprazole   40 mg Oral Daily   sertraline   50 mg Oral Daily   traZODone   150 mg Oral QHS   Continuous Infusions:     LOS: 2 days    Time spent: 35min    Eugune Sine, MD [facility]   04/05/2024, 7:43 AM

## 2024-04-05 NOTE — Plan of Care (Signed)
   Problem: Education: Goal: Knowledge of General Education information will improve Description Including pain rating scale, medication(s)/side effects and non-pharmacologic comfort measures Outcome: Progressing   Problem: Health Behavior/Discharge Planning: Goal: Ability to manage health-related needs will improve Outcome: Progressing

## 2024-04-05 NOTE — Evaluation (Signed)
 Clinical/Bedside Swallow Evaluation Patient Details  Name: [name] MRN: [id number] Date of Birth: 06-Apr-1930  Today's Date: 04/05/2024 Time: SLP Start Time (ACUTE ONLY): 4098 SLP Stop Time (ACUTE ONLY): 1001 SLP Time Calculation (min) (ACUTE ONLY): 9 min  Past Medical History:  Past Medical History:  Diagnosis Date   Anemia    Arthritis    Complication of anesthesia    "I GOT PNEUMONIA THE NEXT DAY FROM THE ANESTHESIA"   Depression    Fibromyalgia    GERD (gastroesophageal reflux disease)    History of skin cancer    HLD (hyperlipidemia)    HTN (hypertension)    Internal hemorrhoids    Osteoarthritis    Peripheral neuropathy    Shortness of breath    Skin cancer    SUI (stress urinary incontinence, female)    Ulcerative colitis    Past Surgical History:  Past Surgical History:  Procedure Laterality Date   ABDOMINAL HYSTERECTOMY     APPENDECTOMY     BREAST EXCISIONAL BIOPSY Left 08/10/2011    Dr Linell Rhymes   BREAST SURGERY     CARPAL TUNNEL RELEASE Left    CESAREAN SECTION     CHOLECYSTECTOMY     COLONOSCOPY  04/29/2003   internal hemorrhoids   JOINT REPLACEMENT Bilateral 2010 and 2012   KNEES   TOTAL HIP ARTHROPLASTY Right 08/06/2014   Procedure: RIGHT TOTAL HIP ARTHROPLASTY ANTERIOR APPROACH;  Surgeon: Aurther Blue, MD;  Location: [facility];  Service: Orthopedics;  Laterality: Right;   TOTAL HIP ARTHROPLASTY Left 12/17/2014   Procedure: LEFT TOTAL HIP ARTHROPLASTY ANTERIOR APPROACH;  Surgeon: Aurther Blue, MD;  Location: [facility];  Service: Orthopedics;  Laterality: Left;   HPI:  Pt is a 89 y.o. female admitted 4/30 following a fall at home. Imaging revealed R femur fx. She underwent ORIF 5/1. PMH: normocytic anemia, OA, depression, fibromyalgia, GERD, fibromyalgia, skin cancer, hyperlipidemia, HTN, internal hemorrhoids, peripheral neuropathy, skin cancer, stress urinary incontinence, ulcerative colitis, h/o B THA/TKA    Assessment / Plan / Recommendation   Clinical Impression  Pt did not exhibit signs of an oropharyngeal dysphagia. Her dentition is intact with strong cough. She stated she coughs infrequently with food/liquid and has wondered "if she needed to have her esophagus stretched" (denies having this prior). There were no signs of aspiration with thin liquid with 3 oz via straw and she was able to masticate solids  with full oral clearance. Recommend she continue with regular texture, thin liquids, pills with liquids and no further ST needed. SLP Visit Diagnosis: Dysphagia, unspecified (R13.10)    Aspiration Risk  No limitations    Diet Recommendation Regular;Thin liquid    Liquid Administration via: Cup;Straw Medication Administration: Whole meds with liquid Supervision: Patient able to self feed Compensations: Slow rate;Small sips/bites Postural Changes: Seated upright at 90 degrees    Other  Recommendations Oral Care Recommendations: Oral care BID    Recommendations for follow up therapy are one component of a multi-disciplinary discharge planning process, led by the attending physician.  Recommendations may be updated based on patient status, additional functional criteria and insurance authorization.  Follow up Recommendations No SLP follow up      Assistance Recommended at Discharge    Functional Status Assessment Patient has not had a recent decline in their functional status  Frequency and Duration            Prognosis        Swallow Study   General Date of  Onset: 04/05/24 HPI: Pt is a 89 y.o. female admitted 4/30 following a fall at home. Imaging revealed R femur fx. She underwent ORIF 5/1. PMH: normocytic anemia, OA, depression, fibromyalgia, GERD, fibromyalgia, skin cancer, hyperlipidemia, HTN, internal hemorrhoids, peripheral neuropathy, skin cancer, stress urinary incontinence, ulcerative colitis, h/o B THA/TKA Type of Study: Bedside Swallow Evaluation Previous Swallow Assessment:  (none) Diet Prior to this  Study: Regular;Thin liquids (Level 0) Temperature Spikes Noted: No Respiratory Status: Room air History of Recent Intubation: No Behavior/Cognition: Alert;Cooperative;Pleasant mood Oral Cavity Assessment: Within Functional Limits Oral Care Completed by SLP: No Oral Cavity - Dentition: Adequate natural dentition Vision: Functional for self-feeding Self-Feeding Abilities: Able to feed self Patient Positioning: Upright in bed Baseline Vocal Quality: Normal Volitional Cough: Strong Volitional Swallow: Able to elicit    Oral/Motor/Sensory Function Overall Oral Motor/Sensory Function: Within functional limits   Ice Chips Ice chips: Not tested   Thin Liquid Thin Liquid: Within functional limits Presentation: Straw    Nectar Thick Nectar Thick Liquid: Not tested   Honey Thick Honey Thick Liquid: Not tested   Puree Puree: Within functional limits   Solid     Solid: Within functional limits      Naomia Bachelor 04/05/2024,10:22 AM

## 2024-04-05 NOTE — Evaluation (Signed)
 Physical Therapy Evaluation Patient Details Name: [name] MRN: [id number] DOB: 10/27/1930 Today's Date: 04/05/2024  History of Present Illness  Pt is a 89 y.o. female admitted 4/30 following a fall at home. Imaging revealed R femur fx. She underwent ORIF 5/1. PMH: normocytic anemia, OA, depression, fibromyalgia, GERD, skin cancer, hyperlipidemia, HTN, internal hemorrhoids, peripheral neuropathy, skin cancer, stress urinary incontinence, ulcerative colitis, h/o B THA/TKA   Clinical Impression  Pt admitted with above diagnosis. PTA pt lived at home with her son. She resided in basement apartment mod I mobility/ADLs with RW vs rollator indoors and electric scooter outdoors. Pt currently with functional limitations due to the deficits listed below (see PT Problem List). On eval, pt required mod assist bed mobility, and mod assist lateral scooting EOB. She demo good sitting balance. NWB/TDWB RLE in primary limiting factor for her mobility. Pt will benefit from acute skilled PT to increase their independence and safety with mobility to allow discharge. Post acute, pt would benefit from further therapy in inpatient setting > 3 hours/day.           If plan is discharge home, recommend the following: A lot of help with bathing/dressing/bathroom;Assistance with cooking/housework;Assist for transportation;Help with stairs or ramp for entrance;Two people to help with walking and/or transfers   Can travel by private vehicle        Equipment Recommendations Wheelchair (measurements PT);BSC/3in1;Other (comment) (drop arm BSC, slide board)  Recommendations for Other Services  Rehab consult    Functional Status Assessment Patient has had a recent decline in their functional status and demonstrates the ability to make significant improvements in function in a reasonable and predictable amount of time.     Precautions / Restrictions Precautions Precautions: Fall Restrictions RLE Weight Bearing Per  Provider Order: Non weight bearing Other Position/Activity Restrictions: NWB per op note. TDWB per order.      Mobility  Bed Mobility Overal bed mobility: Needs Assistance Bed Mobility: Rolling, Supine to Sit, Sit to Supine Rolling: Min assist, Used rails   Supine to sit: Mod assist, HOB elevated, Used rails Sit to supine: Mod assist, Used rails   General bed mobility comments: cues for sequencing, increased time    Transfers Overall transfer level: Needs assistance Equipment used: None               General transfer comment: mod assist +2 safety lateral scoots along EOB. Cues for sequencing. Pt declining standing attempt, stating "I'm just not ready for that."    Ambulation/Gait                  Stairs            Wheelchair Mobility     Tilt Bed    Modified Rankin (Stroke Patients Only)       Balance Overall balance assessment: Needs assistance Sitting-balance support: Feet supported, Single extremity supported Sitting balance-Leahy Scale: Good                                       Pertinent Vitals/Pain Pain Assessment Pain Assessment: 0-10 Pain Score: 8  Pain Location: RLE Pain Descriptors / Indicators: Grimacing, Operative site guarding, Discomfort, Sore Pain Intervention(s): Monitored during session, Repositioned, Limited activity within patient's tolerance    Home Living Family/patient expects to be discharged to:: Private residence Living Arrangements: Children (son) Available Help at Discharge: Family;Available PRN/intermittently Type of Home: House Home Access:  Level entry       Home Layout: Two level;Other (Comment) (ramp to main level of house) Home Equipment: Insurance risk surveyor (2 wheels);Rollator (4 wheels);Shower seat      Prior Function Prior Level of Function : Independent/Modified Independent             Mobility Comments: RW vs rollator in home. Electric scooter outside.        Extremity/Trunk Assessment   Upper Extremity Assessment Upper Extremity Assessment: Overall WFL for tasks assessed    Lower Extremity Assessment Lower Extremity Assessment: RLE deficits/detail RLE Deficits / Details: s/p ORIF R femur    Cervical / Trunk Assessment Cervical / Trunk Assessment: Normal  Communication   Communication Communication: No apparent difficulties    Cognition Arousal: Alert Behavior During Therapy: WFL for tasks assessed/performed   PT - Cognitive impairments: No apparent impairments                         Following commands: Intact       Cueing Cueing Techniques: Verbal cues, Tactile cues, Gestural cues     General Comments General comments (skin integrity, edema, etc.): SpO2 97% on 2L on arrival. Mobilized on RA with SpO2 maintained at 97%. Remained on RA at end of session. BP stable EOB.    Exercises     Assessment/Plan    PT Assessment Patient needs continued PT services  PT Problem List Decreased strength;Decreased balance;Decreased knowledge of precautions;Pain;Decreased mobility;Decreased activity tolerance;Decreased knowledge of use of DME       PT Treatment Interventions DME instruction;Functional mobility training;Balance training;Patient/family education;Therapeutic activities;Gait training;Therapeutic exercise    PT Goals (Current goals can be found in the Care Plan section)  Acute Rehab PT Goals Patient Stated Goal: heal and return to independence PT Goal Formulation: With patient Time For Goal Achievement: 04/19/24 Potential to Achieve Goals: Good    Frequency Min 2X/week     Co-evaluation               AM-PAC PT "6 Clicks" Mobility  Outcome Measure Help needed turning from your back to your side while in a flat bed without using bedrails?: A Little Help needed moving from lying on your back to sitting on the side of a flat bed without using bedrails?: A Lot Help needed moving to and from a bed to  a chair (including a wheelchair)?: Total Help needed standing up from a chair using your arms (e.g., wheelchair or bedside chair)?: Total Help needed to walk in hospital room?: Total Help needed climbing 3-5 steps with a railing? : Total 6 Click Score: 9    End of Session   Activity Tolerance: Patient tolerated treatment well Patient left: in bed;with call bell/phone within reach Nurse Communication: Mobility status;Other (comment) (O2 removed) PT Visit Diagnosis: Other abnormalities of gait and mobility (R26.89);Difficulty in walking, not elsewhere classified (R26.2);Pain Pain - Right/Left: Right Pain - part of body: Leg    Time: 1610-9604 PT Time Calculation (min) (ACUTE ONLY): 28 min   Charges:   PT Evaluation $PT Eval Moderate Complexity: 1 Mod   PT General Charges $$ ACUTE PT VISIT: 1 Visit         Dorothye Gathers., PT  Office # [phone]   Guadelupe Leech 04/05/2024, 8:38 AM

## 2024-04-05 NOTE — Evaluation (Signed)
 Occupational Therapy Evaluation Patient Details Name: [name] MRN: [id number] DOB: 05-27-1930 Today's Date: 04/05/2024   History of Present Illness   Pt is a 89 y.o. female admitted 4/30 following a fall at home. Imaging revealed R femur fx. She underwent ORIF 5/1. PMH: normocytic anemia, OA, depression, fibromyalgia, GERD, skin cancer, hyperlipidemia, HTN, internal hemorrhoids, peripheral neuropathy, skin cancer, stress urinary incontinence, ulcerative colitis, h/o B THA/TKA     Clinical Impressions Pt ambulated with her rollator or RW inside her home and used a scooter to get from her lower level apartment in her son's home to the upper level. She was independent in self care and driving. Pt presents with post operative pain, generalized weakness and baseline UE tremor. Pt requires moderate assistance for bed mobility and mod +2 for safety to simulate lateral scoot transfer along EOB. Pt is aware she has weight bearing precautions on her R LE and did not feel ready to attempt to stand. Patient will benefit from intensive inpatient follow-up therapy, >3 hours/day with goal of modified independence in lateral scoot transfers and seated ADLs.      If plan is discharge home, recommend the following:   Two people to help with walking and/or transfers;A lot of help with bathing/dressing/bathroom;Assistance with cooking/housework;Assist for transportation;Help with stairs or ramp for entrance     Functional Status Assessment   Patient has had a recent decline in their functional status and demonstrates the ability to make significant improvements in function in a reasonable and predictable amount of time.     Equipment Recommendations   Wheelchair cushion (measurements OT);Wheelchair (measurements OT);Other (comment);Tub/shower bench (drop arm commode)     Recommendations for Other Services         Precautions/Restrictions   Precautions Precautions:  Fall Restrictions Weight Bearing Restrictions Per Provider Order: Yes RLE Weight Bearing Per Provider Order: Non weight bearing Other Position/Activity Restrictions: NWB per op note. TDWB per order.     Mobility Bed Mobility Overal bed mobility: Needs Assistance Bed Mobility: Rolling, Supine to Sit, Sit to Supine Rolling: Min assist, Used rails   Supine to sit: Mod assist, HOB elevated, Used rails Sit to supine: Mod assist, Used rails   General bed mobility comments: cues for sequencing, increased time, assist for R LE and to raise trunk, assist for R LE back into bed    Transfers Overall transfer level: Needs assistance Equipment used: None               General transfer comment: mod assist +2 safety lateral scoots along EOB. Cues for sequencing. Pt declining standing attempt, stating "I'm just not ready for that."      Balance Overall balance assessment: Needs assistance   Sitting balance-Leahy Scale: Good                                     ADL either performed or assessed with clinical judgement   ADL Overall ADL's : Needs assistance/impaired Eating/Feeding: Independent;Sitting Eating/Feeding Details (indicate cue type and reason): increased spillage due to tremor Grooming: Set up;Sitting   Upper Body Bathing: Minimal assistance;Sitting   Lower Body Bathing: Total assistance;Bed level   Upper Body Dressing : Set up;Sitting   Lower Body Dressing: Total assistance;Bed level       Toileting- Clothing Manipulation and Hygiene: Total assistance;Bed level       Functional mobility during ADLs: Moderate assistance;+2 for safety/equipment (simulated lateral  scoot at EOB)       Vision Baseline Vision/History: 1 Wears glasses Ability to See in Adequate Light: 0 Adequate Patient Visual Report: No change from baseline       Perception         Praxis         Pertinent Vitals/Pain Pain Assessment Pain Assessment: Faces Faces Pain  Scale: Hurts little more Pain Location: RLE Pain Descriptors / Indicators: Grimacing, Operative site guarding, Discomfort, Sore Pain Intervention(s): Monitored during session, Repositioned     Extremity/Trunk Assessment Upper Extremity Assessment Upper Extremity Assessment: Right hand dominant;RUE deficits/detail;LUE deficits/detail RUE Deficits / Details: baseline tremor RUE Coordination: decreased fine motor LUE Deficits / Details: baseline tremor LUE Coordination: decreased fine motor   Lower Extremity Assessment Lower Extremity Assessment: Defer to PT evaluation RLE Deficits / Details: s/p ORIF R femur   Cervical / Trunk Assessment Cervical / Trunk Assessment: Normal   Communication Communication Communication: No apparent difficulties   Cognition Arousal: Alert Behavior During Therapy: WFL for tasks assessed/performed Cognition: No apparent impairments                               Following commands: Intact       Cueing  General Comments   Cueing Techniques: Verbal cues;Tactile cues;Gestural cues  97% on RA   Exercises     Shoulder Instructions      Home Living Family/patient expects to be discharged to:: Private residence Living Arrangements: Children (son) Available Help at Discharge: Family;Available PRN/intermittently Type of Home: House Home Access: Level entry     Home Layout: Two level;Other (Comment) (ramp to main level of house)     Bathroom Shower/Tub: Chief Strategy Officer: Standard     Home Equipment: Insurance risk surveyor (2 wheels);Rollator (4 wheels);Shower seat;Other (comment) (adjustable bed)          Prior Functioning/Environment Prior Level of Function : Independent/Modified Independent;Driving             Mobility Comments: RW vs rollator in home. Electric scooter outside. ADLs Comments: sits down in tub to bathe    OT Problem List: Decreased strength;Decreased knowledge of  precautions;Pain   OT Treatment/Interventions: Self-care/ADL training;DME and/or AE instruction;Therapeutic activities;Patient/family education;Balance training      OT Goals(Current goals can be found in the care plan section)   Acute Rehab OT Goals OT Goal Formulation: With patient Time For Goal Achievement: 04/19/24 Potential to Achieve Goals: Good ADL Goals Pt Will Perform Grooming: with set-up;sitting Pt Will Perform Lower Body Bathing: with modified independence;sitting/lateral leans Pt Will Perform Lower Body Dressing: sitting/lateral leans;with set-up Pt Will Transfer to Toilet: with modified independence;with transfer board;bedside commode Pt Will Perform Toileting - Clothing Manipulation and hygiene: with modified independence;sit to/from stand Additional ADL Goal #1: Pt will complete bed mobility mod I.   OT Frequency:  Min 2X/week    Co-evaluation PT/OT/SLP Co-Evaluation/Treatment: Yes     OT goals addressed during session: ADL's and self-care      AM-PAC OT "6 Clicks" Daily Activity     Outcome Measure Help from another person eating meals?: None Help from another person taking care of personal grooming?: A Little Help from another person toileting, which includes using toliet, bedpan, or urinal?: Total Help from another person bathing (including washing, rinsing, drying)?: A Lot Help from another person to put on and taking off regular upper body clothing?: A Little Help from another person  to put on and taking off regular lower body clothing?: Total 6 Click Score: 14   End of Session    Activity Tolerance: Patient tolerated treatment well Patient left: in bed;with call bell/phone within reach;with bed alarm set;Other (comment) (in chair position)  OT Visit Diagnosis: Pain;Muscle weakness (generalized) (M62.81)                Time: 4782-9562 OT Time Calculation (min): 25 min Charges:  OT General Charges $OT Visit: 1 Visit OT Evaluation $OT Eval  Moderate Complexity: 1 Mod Avanell Leigh, OTR/L Acute Rehabilitation Services Office: [phone]  Jonette Nestle 04/05/2024, 9:40 AM

## 2024-04-05 NOTE — Progress Notes (Signed)
  Inpatient Rehab Admissions Coordinator :  Per therapy recommendations, patient was screened for CIR candidacy by Ottie Glazier RN MSN.  At this time patient appears to be a potential candidate for CIR. I will place a rehab consult per protocol for full assessment. Please call me with any questions.  Ottie Glazier RN MSN Admissions Coordinator [phone]

## 2024-04-05 NOTE — Progress Notes (Signed)
 Orthopaedic Trauma Service Progress Note  Patient ID: [name] MRN: [id number] DOB/AGE: Mar 17, 1930 89 y.o.  Subjective:  Doing well Minimal pain in R leg  States her baseline neuropathy is causing her more pain than anything else Baseline LEx neuropathy due to chronic degenerative back disease   Wants to return home from hospital  Lives at son's house in a ground level apartment   ROS As above  Today's  total administered Morphine  Milligram Equivalents: 0 Yesterday's total administered Morphine  Milligram Equivalents: 54  Objective:   VITALS:   Vitals:   04/04/24 1502 04/04/24 1928 04/05/24 0407 04/05/24 0800  BP: (!) 94/59 (!) 123/57 (!) 104/49 109/69  Pulse: 84 82 71 93  Resp: 15 13 15 17   Temp:  98.2 F (36.8 C) 98 F (36.7 C)   TempSrc:  Oral Oral   SpO2: 98% 92% 100% 99%  Weight:      Height:        Estimated body mass index is 30.85 kg/m as calculated from the following:   Height as of this encounter: 5\' 10"  (1.778 m).   Weight as of this encounter: 97.5 kg.   Intake/Output      05/01 0701 05/02 0700 05/02 0701 05/03 0700   P.O. 240    I.V. (mL/kg) 1300 (13.3)    IV Piggyback 350    Total Intake(mL/kg) 1890 (19.4)    Urine (mL/kg/hr) 0 (0)    Blood 100    Total Output 100    Net +1790         Urine Occurrence 3 x      LABS  Results for orders placed or performed during the hospital encounter of 04/03/24 (from the past 24 hours)  CBC     Status: Abnormal   Collection Time: 04/04/24  1:01 PM  Result Value Ref Range   WBC 14.6 (H) 4.0 - 10.5 K/uL   RBC 3.26 (L) 3.87 - 5.11 MIL/uL   Hemoglobin 9.6 (L) 12.0 - 15.0 g/dL   HCT 95.6 (L) 21.3 - 08.6 %   MCV 93.6 80.0 - 100.0 fL   MCH 29.4 26.0 - 34.0 pg   MCHC 31.5 30.0 - 36.0 g/dL   RDW 57.8 46.9 - 62.9 %   Platelets 189 150 - 400 K/uL   nRBC 0.0 0.0 - 0.2 %  Creatinine, serum     Status: Abnormal   Collection  Time: 04/04/24  1:01 PM  Result Value Ref Range   Creatinine, Ser 1.91 (H) 0.44 - 1.00 mg/dL   GFR, Estimated 24 (L) >60 mL/min  CBC     Status: Abnormal   Collection Time: 04/05/24  6:09 AM  Result Value Ref Range   WBC 13.8 (H) 4.0 - 10.5 K/uL   RBC 2.86 (L) 3.87 - 5.11 MIL/uL   Hemoglobin 8.5 (L) 12.0 - 15.0 g/dL   HCT 52.8 (L) 41.3 - 24.4 %   MCV 91.6 80.0 - 100.0 fL   MCH 29.7 26.0 - 34.0 pg   MCHC 32.4 30.0 - 36.0 g/dL   RDW 01.0 27.2 - 53.6 %   Platelets 171 150 - 400 K/uL   nRBC 0.0 0.0 - 0.2 %  Basic metabolic panel with GFR     Status: Abnormal   Collection Time: 04/05/24  6:09 AM  Result Value Ref Range   Sodium 138 135 - 145 mmol/L   Potassium 4.9 3.5 - 5.1 mmol/L   Chloride 104 98 - 111 mmol/L   CO2 26 22 - 32 mmol/L   Glucose, Bld 137 (H) 70 - 99 mg/dL   BUN 24 (H) 8 - 23 mg/dL   Creatinine, Ser 4.09 (H) 0.44 - 1.00 mg/dL   Calcium  8.8 (L) 8.9 - 10.3 mg/dL   GFR, Estimated 45 (L) >60 mL/min   Anion gap 8 5 - 15  TSH     Status: None   Collection Time: 04/05/24  6:09 AM  Result Value Ref Range   TSH 0.689 0.350 - 4.500 uIU/mL  VITAMIN D 25 Hydroxy (Vit-D Deficiency, Fractures)     Status: None   Collection Time: 04/05/24  6:09 AM  Result Value Ref Range   Vit D, 25-Hydroxy 82.94 30 - 100 ng/mL     PHYSICAL EXAM:   Gen: sitting up in bed, looks great, very pleasant  Ext:       Right Lower Extremity Dressing is clean, dry and intact  Extremity is warm  No DCT  Compartments are soft  No pain out of proportion with passive stretching of his toes or ankle  DPN, SPN, TN sensory functions at baseline   EHL, FHL, lesser toe motor functions intact  Ankle flexion, extension, inversion eversion intact  + DP pulse  Assessment/Plan: 1 Day Post-Op   Principal Problem:   Closed displaced oblique fracture of shaft of right femur (HCC) Active Problems:   Hyperlipidemia   Depression   Essential hypertension   GERD   Normocytic anemia   Hyperglycemia    Atrial fibrillation with RVR (HCC)   New onset atrial fibrillation (HCC)   Glaucoma   Aortic atherosclerosis (HCC)   Anti-infectives (From admission, onward)    Start     Dose/Rate Route Frequency Ordered Stop   04/04/24 1800  ceFAZolin  (ANCEF ) IVPB 2g/100 mL premix        2 g 200 mL/hr over 30 Minutes Intravenous  Once 04/04/24 1335 04/04/24 2108   04/04/24 1500  ceFAZolin  (ANCEF ) IVPB 2g/100 mL premix  Status:  Discontinued        2 g 200 mL/hr over 30 Minutes Intravenous Every 6 hours 04/04/24 1222 04/04/24 1335   04/04/24 0600  ceFAZolin  (ANCEF ) IVPB 2g/100 mL premix        2 g 200 mL/hr over 30 Minutes Intravenous On call to O.R. 04/03/24 1617 04/04/24 0850     .  POD/HD#: 1  - 89 y/o female s/p fall with R periprosthetic dist femur fracture below THA   - fall  -R periprosthetic distal femur fracture below THA  Weightbearing NWB R leg x 4-6 weeks    ROM/Activity   No knee motion restriction   Standard joint precautions for post THA   Wound care   Wound care as needed starting on 04/07/2024   Therapy evals  TOC consult    - Pain management:  Multimodal   - ABL anemia/Hemodynamics  Monitor   Cbc in am   - Medical issues   Per primary   - DVT/PE prophylaxis:  Lovenox  and scds  At dc continue asa 81 mg daily   - ID:   Periop abx   - Metabolic Bone Disease:  Vitamin d levels look good   - Impediments to fracture healing:  Fragility fracture    - Dispo:  Therapy evals   TOC consult  [name], PA-C [phone] (C) 04/05/2024, 10:45 AM  [facility] [address] [place] [address] [phone] [name] (F)    After 5pm and on the weekends please log on to Amion, go to orthopaedics and the look under the [facility] Call for the provider(s) on call. You can also call our office at [phone] and then follow the prompts to be connected to the call team.  Patient ID: [name],  female   DOB: [birth year], 89 y.o.   MRN: [id number]

## 2024-04-06 DIAGNOSIS — S72401A Unspecified fracture of lower end of right femur, initial encounter for closed fracture: Secondary | ICD-10-CM | POA: Diagnosis not present

## 2024-04-06 LAB — CBC
HCT: 27.4 % — ABNORMAL LOW (ref 36.0–46.0)
Hemoglobin: 8.7 g/dL — ABNORMAL LOW (ref 12.0–15.0)
MCH: 29 pg (ref 26.0–34.0)
MCHC: 31.8 g/dL (ref 30.0–36.0)
MCV: 91.3 fL (ref 80.0–100.0)
Platelets: 186 10*3/uL (ref 150–400)
RBC: 3 MIL/uL — ABNORMAL LOW (ref 3.87–5.11)
RDW: 14.6 % (ref 11.5–15.5)
WBC: 13 10*3/uL — ABNORMAL HIGH (ref 4.0–10.5)
nRBC: 0 % (ref 0.0–0.2)

## 2024-04-06 LAB — BASIC METABOLIC PANEL WITH GFR
Anion gap: 7 (ref 5–15)
BUN: 25 mg/dL — ABNORMAL HIGH (ref 8–23)
CO2: 30 mmol/L (ref 22–32)
Calcium: 9.3 mg/dL (ref 8.9–10.3)
Chloride: 102 mmol/L (ref 98–111)
Creatinine, Ser: 0.94 mg/dL (ref 0.44–1.00)
GFR, Estimated: 57 mL/min — ABNORMAL LOW (ref >60)
Glucose, Bld: 99 mg/dL (ref 70–99)
Potassium: 4.5 mmol/L (ref 3.5–5.1)
Sodium: 139 mmol/L (ref 135–145)

## 2024-04-06 MED ORDER — BISACODYL 10 MG RE SUPP
10.0000 mg | Freq: Once | RECTAL | Status: AC
  Administered 2024-04-06: 10 mg via RECTAL
  Filled 2024-04-06: qty 1

## 2024-04-06 MED ORDER — POLYETHYLENE GLYCOL 3350 17 G PO PACK
17.0000 g | PACK | Freq: Two times a day (BID) | ORAL | Status: DC
  Administered 2024-04-06 – 2024-04-09 (×5): 17 g via ORAL
  Filled 2024-04-06 (×6): qty 1

## 2024-04-06 MED ORDER — APIXABAN 5 MG PO TABS
5.0000 mg | ORAL_TABLET | Freq: Two times a day (BID) | ORAL | Status: DC
  Administered 2024-04-07 – 2024-04-09 (×5): 5 mg via ORAL
  Filled 2024-04-06 (×5): qty 1

## 2024-04-06 MED ORDER — ACETAMINOPHEN 500 MG PO TABS: 500.0000 mg | ORAL_TABLET | Freq: Three times a day (TID) | ORAL | Status: DC

## 2024-04-06 MED ORDER — BISACODYL 10 MG RE SUPP: 10.0000 mg | Freq: Every day | RECTAL | Status: DC | PRN

## 2024-04-06 NOTE — PMR Pre-admission (Signed)
 PMR Admission Coordinator Pre-Admission Assessment  Patient: [name] is an 89 y.o., female MRN: [id number] DOB: December 22, 1929 Height: 5\' 10"  (177.8 cm) Weight: 97.5 kg  Insurance Information HMO:     PPO:      PCP:      IPA:      80/20: yes     OTHER:  PRIMARY: Medicare Part A and B      Policy#: [id number]      Subscriber: patient CM Name:       Phone#:      Fax#:  Pre-Cert#:       Employer:  Benefits:  Phone #: verified eligibility via [employer] on 04/06/24     Name:  Eff. Date: Part A and B effective 06/04/97     Deduct: $1,676      Out of Pocket Max: NA      Life Max: NA CIR: 1005 coverage      SNF: 100% coverage for days 1-20, 80% coverage for days 21-100 Outpatient: 80% coverage     Co-Pay: 20% Home Health: 100% coverage      Co-Pay:  DME: 80% coverage     Co-Pay: 20% Providers: pt's choice SECONDARY: Scarlette Currier PPO      Policy#: [id number]     Phone#: [phone]  Financial Counselor:       Phone#:   The "Data Collection Information Summary" for patients in Inpatient Rehabilitation Facilities with attached "Privacy Act Statement-Health Care Records" was provided and verbally reviewed with: {CHL IP Patient Family BM:[id number]}  Emergency Contact Information Contact Information     Name Relation Home Work Mobile   [place] Son [phone]  [phone]   Marye Smoker [phone]  [phone]      Other Contacts   None on File     Current Medical History  Patient Admitting Diagnosis: Right femur fracture s/p ORIF History of Present Illness: Pt is a 89 year old female with medical hx significant for: ulcerative colitis, peripheral neuropathy, HTN, skin CA, stress urinary incontinence, hyperlipidemia, GERD, fibromyalgia. Pt presented to [facility] on 04/03/24 after a fall. Pt reported pain in right mid femur. New onset A-fib in ED. X-rays show comminuted right femoral shaft fracture between total hip arthroplasty and total knee arthroplasty. Pt  transferred to [facility] on 4/30 for further workup. Orthopedics consulted and recommended surgery. Pt underwent ORIF right supracondylar femur fracture by Dr. Guyann Leitz on 04/04/24. Therapy evaluations completed and CIR recommended d/t pt's deficits in functional mobility.     Patient's medical record from [facility] has been reviewed by the rehabilitation admission coordinator and physician.  Past Medical History  Past Medical History:  Diagnosis Date   Anemia    Arthritis    Complication of anesthesia    "I GOT PNEUMONIA THE NEXT DAY FROM THE ANESTHESIA"   Depression    Fibromyalgia    GERD (gastroesophageal reflux disease)    History of skin cancer    HLD (hyperlipidemia)    HTN (hypertension)    Internal hemorrhoids    Osteoarthritis    Peripheral neuropathy    Shortness of breath    Skin cancer    SUI (stress urinary incontinence, female)    Ulcerative colitis     Has the patient had major surgery during 100 days prior to admission? Yes  Family History   family history includes Colon cancer (age of onset: 39) in her mother; Diabetes in her brother.  Current Medications  Current Facility-Administered Medications:  acetaminophen  (TYLENOL ) tablet 650 mg, 650 mg, Oral, Q6H, 650 mg at 04/06/24 1148 **OR** acetaminophen  (TYLENOL ) suppository 650 mg, 650 mg, Rectal, Q6H, [name], PA-C   aspirin  EC tablet 81 mg, 81 mg, Oral, Daily, Danice Dural, MD, 81 mg at 04/06/24 0820   bisacodyl  (DULCOLAX) suppository 10 mg, 10 mg, Rectal, Daily PRN, Versie Gores, PA-C   bisacodyl  (DULCOLAX) suppository 10 mg, 10 mg, Rectal, Once, Regalado, Belkys A, MD   diltiazem  (CARDIZEM  CD) 24 hr capsule 120 mg, 120 mg, Oral, Daily, Regalado, Belkys A, MD, 120 mg at 04/06/24 0820   docusate sodium  (COLACE) capsule 100 mg, 100 mg, Oral, Daily, Danice Dural, MD, 100 mg at 04/06/24 4098   dorzolamide -timolol  (COSOPT ) 2-0.5 % ophthalmic solution 1 drop, 1 drop, Both Eyes, BID,  Danice Dural, MD, 1 drop at 04/06/24 1191   enoxaparin  (LOVENOX ) injection 40 mg, 40 mg, Subcutaneous, Q24H, [name], PA-C, 40 mg at 04/06/24 4782   feeding supplement (ENSURE ENLIVE / ENSURE PLUS) liquid 237 mL, 237 mL, Oral, BID BM, Deforest Fast, MD, 237 mL at 04/06/24 1149   ferrous sulfate  tablet 325 mg, 325 mg, Oral, Q breakfast, Regalado, Belkys A, MD, 325 mg at 04/06/24 9562   gabapentin  (NEURONTIN ) capsule 400 mg, 400 mg, Oral, Daily, Danice Dural, MD, 400 mg at 04/06/24 1308   latanoprost  (XALATAN ) 0.005 % ophthalmic solution 1 drop, 1 drop, Both Eyes, QHS, Danice Dural, MD, 1 drop at 04/05/24 2115   menthol -cetylpyridinium (CEPACOL) lozenge 3 mg, 1 lozenge, Oral, PRN **OR** phenol (CHLORASEPTIC) mouth spray 1 spray, 1 spray, Mouth/Throat, PRN, [name], PA-C   metoCLOPramide  (REGLAN ) tablet 5-10 mg, 5-10 mg, Oral, Q8H PRN **OR** metoCLOPramide  (REGLAN ) injection 5-10 mg, 5-10 mg, Intravenous, Q8H PRN, [name], PA-C   metoprolol  tartrate (LOPRESSOR ) injection 2.5 mg, 2.5 mg, Intravenous, Q5 min PRN, Danice Dural, MD   multivitamin with minerals tablet 1 tablet, 1 tablet, Oral, Daily, Deforest Fast, MD, 1 tablet at 04/06/24 6578   ondansetron  (ZOFRAN ) tablet 4 mg, 4 mg, Oral, Q6H PRN **OR** ondansetron  (ZOFRAN ) injection 4 mg, 4 mg, Intravenous, Q6H PRN, Danice Dural, MD   oxyCODONE  (Oxy IR/ROXICODONE ) immediate release tablet 5 mg, 5 mg, Oral, Q6H PRN, Regalado, Belkys A, MD, 5 mg at 04/06/24 1011   pantoprazole  (PROTONIX ) EC tablet 40 mg, 40 mg, Oral, Daily, Danice Dural, MD, 40 mg at 04/06/24 4696   polyethylene glycol (MIRALAX  / GLYCOLAX ) packet 17 g, 17 g, Oral, BID, Regalado, Belkys A, MD   senna (SENOKOT) tablet 8.6 mg, 1 tablet, Oral, Daily, Regalado, Belkys A, MD, 8.6 mg at 04/06/24 0819   sertraline  (ZOLOFT ) tablet 50 mg, 50 mg, Oral, Daily, Danice Dural, MD, 50 mg at 04/06/24 2952   traZODone  (DESYREL ) tablet 150 mg,  150 mg, Oral, QHS, Danice Dural, MD, 150 mg at 04/05/24 2115  Patients Current Diet:  Diet Order             Diet regular Room service appropriate? Yes; Fluid consistency: Thin  Diet effective now                   Precautions / Restrictions Precautions Precautions: Fall Restrictions Weight Bearing Restrictions Per Provider Order: Yes RLE Weight Bearing Per Provider Order: Non weight bearing Other Position/Activity Restrictions: NWB per op note. TDWB per order.   Has the patient had 2 or more falls or a fall with injury in the past year? Yes  Prior Activity Level Community (5-7x/wk): gets out of house ~4 days/week; drives  Prior Functional Level Self Care: Did the patient need help bathing, dressing, using the toilet or eating? Independent  Indoor Mobility: Did the patient need assistance with walking from room to room (with or without device)? Independent  Stairs: Did the patient need assistance with internal or external stairs (with or without device)? Independent  Functional Cognition: Did the patient need help planning regular tasks such as shopping or remembering to take medications? Independent  Patient Information Are you of Hispanic, Latino/a,or Spanish origin?: A. No, not of Hispanic, Latino/a, or Spanish origin What is your race?: A. White Do you need or want an interpreter to communicate with a doctor or health care staff?: 0. No  Patient's Response To:  Health Literacy and Transportation Is the patient able to respond to health literacy and transportation needs?: Yes Health Literacy - How often do you need to have someone help you when you read instructions, pamphlets, or other written material from your doctor or pharmacy?: Never In the past 12 months, has lack of transportation kept you from medical appointments or from getting medications?: No In the past 12 months, has lack of transportation kept you from meetings, work, or from getting things  needed for daily living?: No  Journalist, newspaper / Equipment Home Equipment: Art gallery manager, Agricultural consultant (2 wheels), Rollator (4 wheels), Shower seat, Other (comment) (adjustable bed)  Prior Device Use: Indicate devices/aids used by the patient prior to current illness, exacerbation or injury? Walker and Teaching laboratory technician Level: Oriented X4    Extremity Assessment (includes Sensation/Coordination)  Upper Extremity Assessment: Right hand dominant, RUE deficits/detail, LUE deficits/detail RUE Deficits / Details: baseline tremor RUE Coordination: decreased fine motor LUE Deficits / Details: baseline tremor LUE Coordination: decreased fine motor  Lower Extremity Assessment: Defer to PT evaluation RLE Deficits / Details: s/p ORIF R femur    ADLs  Overall ADL's : Needs assistance/impaired Eating/Feeding: Independent, Sitting Eating/Feeding Details (indicate cue type and reason): increased spillage due to tremor Grooming: Set up, Sitting Upper Body Bathing: Minimal assistance, Sitting Lower Body Bathing: Total assistance, Bed level Upper Body Dressing : Set up, Sitting Lower Body Dressing: Total assistance, Bed level Toileting- Clothing Manipulation and Hygiene: Total assistance, Bed level Functional mobility during ADLs: Moderate assistance, +2 for safety/equipment (simulated lateral scoot at EOB)    Mobility  Overal bed mobility: Needs Assistance Bed Mobility: Supine to Sit Rolling: Min assist, Used rails Supine to sit: Contact guard Sit to supine: Mod assist, Used rails General bed mobility comments: Able to maneuver to edge of bed with HOB elevated without physical assist. Increased time/effort    Transfers  Overall transfer level: Needs assistance Equipment used: Rolling walker (2 wheels), None Transfers: Sit to/from Stand, Bed to chair/wheelchair/BSC Sit to Stand: Max assist, +2 physical assistance Bed to/from  chair/wheelchair/BSC transfer type:: Lateral/scoot transfer  Lateral/Scoot Transfers: Min assist, +2 safety/equipment General transfer comment: Initially attempted to stand from edge of bed with RW, but pt only able to minimally clear hips. Performed lateral scoot transfer from bed to chair towards left with multiple scoots, cues for head/hip relationship, and minA for positioning hips back into chair.    Ambulation / Gait / Stairs / Wheelchair Mobility  Ambulation/Gait General Gait Details: unable    Posture / Balance Balance Overall balance assessment: Needs assistance Sitting-balance support: Feet supported Sitting balance-Leahy Scale: Good    Special needs/care consideration Oxygen  2L nasal cannula, Bladder incontinence and Skin Surgical Incision: thigh/right; Abrasion: arm/right; Ecchymosis: arm, leg/bilateral   Previous Home Environment (from acute therapy documentation) Living Arrangements: Children  Lives With: Son (daughter-in-law) Available Help at Discharge: Family, Available PRN/intermittently Type of Home: House Home Layout: Two level, Other (Comment) (ramp to main level of house) Home Access: Level entry Bathroom Shower/Tub: Engineer, manufacturing systems: Standard Bathroom Accessibility: No Home Care Services: No  Discharge Living Setting Plans for Discharge Living Setting: Patient's home Type of Home at Discharge: House Discharge Home Layout: Two level, Other (Comment) (lives in in-law suite in basement) Discharge Home Access: Level entry Discharge Bathroom Shower/Tub: Tub/shower unit Discharge Bathroom Toilet: Standard Discharge Bathroom Accessibility: No Does the patient have any problems obtaining your medications?: No  Social/Family/Support Systems Anticipated Caregiver: Alejo Hurter, Glee Lana and other sons Anticipated Industrial/product designer Information: Matt: [phone], Skip: [phone] Caregiver Availability: 24/7 Discharge Plan Discussed with  Primary Caregiver: Yes Is Caregiver In Agreement with Plan?: Yes Does Caregiver/Family have Issues with Lodging/Transportation while Pt is in Rehab?: No  Goals Patient/Family Goal for Rehab: *** Expected length of stay: *** Pt/Family Agrees to Admission and willing to participate: Yes Program Orientation Provided & Reviewed with Pt/Caregiver Including Roles  & Responsibilities: Yes  Decrease burden of Care through IP rehab admission: NA  Possible need for SNF placement upon discharge: Not anticipated  Patient Condition: I have reviewed medical records from [facility], spoken with {CHL IP CSW GN:[id number]}, and patient and son. I met with patient at the bedside and discussed via phone for inpatient rehabilitation assessment.  Patient will benefit from ongoing PT and OT, can actively participate in 3 hours of therapy a day 5 days of the week, and can make measurable gains during the admission.  Patient will also benefit from the coordinated team approach during an Inpatient Acute Rehabilitation admission.  The patient will receive intensive therapy as well as Rehabilitation physician, nursing, social worker, and care management interventions.  Due to bladder management, safety, skin/wound care, disease management, medication administration, pain management, and patient education the patient requires 24 hour a day rehabilitation nursing.  The patient is currently *** with mobility and basic ADLs.  Discharge setting and therapy post discharge at home with home health is anticipated.  Patient has agreed to participate in the Acute Inpatient Rehabilitation Program and will admit {Time; today/tomorrow:10263}.  Preadmission Screen Completed By:  Amiel Kalata, 04/06/2024 5:03 PM ______________________________________________________________________   Discussed status with Dr. Aaron Aas on *** at *** and received approval for admission today.  Admission Coordinator:  Amiel Kalata,  CCC-SLP, time ***/Date ***   Assessment/Plan: Diagnosis: *** Does the need for close, 24 hr/day Medical supervision in concert with the patient's rehab needs make it unreasonable for this patient to be served in a less intensive setting? {yes_no_potentially:3041433} Co-Morbidities requiring supervision/potential complications: *** Due to {due HQ:4696295}, does the patient require 24 hr/day rehab nursing? {yes_no_potentially:3041433} Does the patient require coordinated care of a physician, rehab nurse, PT, OT, and SLP to address physical and functional deficits in the context of the above medical diagnosis(es)? {yes_no_potentially:3041433} Addressing deficits in the following areas: {deficits:3041436} Can the patient actively participate in an intensive therapy program of at least 3 hrs of therapy 5 days a week? {yes_no_potentially:3041433} The potential for patient to make measurable gains while on inpatient rehab is {potential:3041437} Anticipated functional outcomes upon discharge from inpatient rehab: {functional outcomes:304600100} PT, {functional outcomes:304600100} OT, {functional outcomes:304600100} SLP Estimated rehab length of stay  to reach the above functional goals is: *** Anticipated discharge destination: {anticipated dc setting:21604} 10. Overall Rehab/Functional Prognosis: {potential:3041437}   MD Signature: ***

## 2024-04-06 NOTE — Progress Notes (Addendum)
 Inpatient Rehab Admissions:  Inpatient Rehab Consult received.  I met with patient at the bedside for rehabilitation assessment and to discuss goals and expectations of an inpatient rehab admission.  Discussed average length of stay and discharge home after completion of CIR. Pt acknowledged understanding. Pt interested in pursuing CIR. Pt gave permission to contact son Skip. Called Skip to verify dispo and left a message; awaiting return call. Will continue to follow.  1149: Skip returned call. Discussed CIR goals and expectations. He acknowledged understanding. He is supportive of pt pursuing CIR. He confirmed that family will be able to provide 24/7 support for 2-3 weeks.   Signed: Artemus Larsen, MS, CCC-SLP Admissions Coordinator [phone]

## 2024-04-06 NOTE — Progress Notes (Signed)
 PROGRESS NOTE    [name]  WUJ:[id number] DOB: 02/19/1930 DOA: 04/03/2024 PCP: Pcp, No   Hospital course, HPI 89 year old past medical history significant for anemia, depression, fibromyalgia, GERD, hypertension, internal hemorrhoids, osteoarthritis, peripheral neuropathy, ulcerative colitis presented to the ED following a mechanical fall, subsequently injured her right lower leg.  In the ED she was found to be in A-fib with RVR and did converted to sinus rhythm on diltiazem .  X-ray showed hip pelvis oblique fracture of the distal femoral metaphysis.  Orthopedic consulted and patient underwent open reduction internal fixation of the right supracondylar femur fracture using a Smith Nephew Plate.    Subjective: Patient is alert and conversant, she is having some pain in the right leg surgical site.  No bowel movement yet.  Assessment and Plan: Right supracondylar periprosthetic distal femur fracture below total hip arthroplasty; - Orthopedics consulted.  -Patient underwent reduction internal fixation of the right supracondylar femur fracture using a Smith [employer] Nephew plate -Per Ortho ok to start Eliquis tomorrow.  -Will benefit from rehab   Atrial fibrillation with RVR (HCC) -Transient, converted to sinus rhythm -Changed to oral diltiazem  -Start DOAC when okay by orthopedic -d/w Cards, they arranged FU 5/8 at 9am  -2D echo: Ejection fraction, TSH normal 0.689 [place] DS2 VAS: 4---4.8% stroke per year  Plan to start Eliquis tomorrow, per ortho.  Family agrees with anticoagulation, we discussed risk benefit, previous history of fall. They will follow up with cardiology out patient to determine if they will continue indefinitely/   AKI;  -post op , in setting hypotension.  -check bladder scan.  - Titrate urine output, creatinine appears to have improved this morning down to 1.1 from 2 yesterday Resolved.   Acute Blood loss Anemia:  - Postsurgery expected hemoglobin on admission 11  down to 8.5 today.  Will start iron supplement.  Plan to monitor for now. Of none clinical significance so far Hb stable.   Depression Continue sertraline  Continue trazodone     Essential hypertension Now on Cardizem , hold lisinopril/hydrochlorothiazide  in setting AKI Holder parameters for Cardizem  to avoid hypotension  GERD Continue pantoprazole  40 mg p.o. daily.   Normocytic anemia Monitor hematocrit and hemoglobin.   Constipation; Continue with Miralax , senna. Will order  Suppository     Advance Care Planning:   Code Status: Full Code    DVT prophylaxis:  Code Status: Full code Family Communication: no family at bedside.  Disposition Plan: May need rehab  Consultants: Orthopedics   Procedures:   Antimicrobials:    Objective: Vitals:   04/05/24 0800 04/05/24 2010 04/05/24 2333 04/06/24 0540  BP: 109/69 (!) 139/59 127/66 (!) 147/82  Pulse: 93 91 87 98  Resp: 17 14 (!) 21 (!) 22  Temp:  98.2 F (36.8 C) 98.7 F (37.1 C) 98.2 F (36.8 C)  TempSrc:  Oral Oral Tympanic  SpO2: 99% 90% 91% 92%  Weight:      Height:        Intake/Output Summary (Last 24 hours) at 04/06/2024 0706 Last data filed at 04/06/2024 9562 Gross per 24 hour  Intake 830 ml  Output 400 ml  Net 430 ml   Filed Weights   04/03/24 1034  Weight: 97.5 kg    Examination:  General exam: NAD Respiratory system: Clear  to auscultation Cardiovascular system: S 1, S 2 RRR Abd: BS present, soft, nt Extremities: no edema, right extremities with clean dressing.      Data Reviewed:   CBC: Recent Labs  Lab 04/03/24  1101 04/04/24 0420 04/04/24 1301 04/05/24 0609 04/06/24 0418  WBC 11.1* 13.1* 14.6* 13.8* 13.0*  HGB 11.9* 10.4* 9.6* 8.5* 8.7*  HCT 38.3 32.9* 30.5* 26.2* 27.4*  MCV 94.1 93.5 93.6 91.6 91.3  PLT 212 248 189 171 186   Basic Metabolic Panel: Recent Labs  Lab 04/03/24 1101 04/03/24 1404 04/04/24 0420 04/04/24 1301 04/05/24 0609 04/06/24 0418  NA 138  --  139  --   138 139  K 3.8  --  4.5  --  4.9 4.5  CL 103  --  103  --  104 102  CO2 25  --  25  --  26 30  GLUCOSE 109*  --  137*  --  137* 99  BUN 23  --  26*  --  24* 25*  CREATININE 0.95  --  2.08* 1.91* 1.15* 0.94  CALCIUM  9.1  --  9.2  --  8.8* 9.3  MG  --  2.0  --   --   --   --   PHOS  --  3.9  --   --   --   --    GFR: Estimated Creatinine Clearance: 49.3 mL/min (by C-G formula based on SCr of 0.94 mg/dL). Liver Function Tests: Recent Labs  Lab 04/04/24 0420  AST 24  ALT 15  ALKPHOS 37*  BILITOT 0.7  PROT 5.1*  ALBUMIN  3.1*   No results for input(s): "LIPASE", "AMYLASE" in the last 168 hours. No results for input(s): "AMMONIA" in the last 168 hours. Coagulation Profile: No results for input(s): "INR", "PROTIME" in the last 168 hours. Cardiac Enzymes: No results for input(s): "CKTOTAL", "CKMB", "CKMBINDEX", "TROPONINI" in the last 168 hours. BNP (last 3 results) No results for input(s): "PROBNP" in the last 8760 hours. HbA1C: No results for input(s): "HGBA1C" in the last 72 hours. CBG: No results for input(s): "GLUCAP" in the last 168 hours. Lipid Profile: No results for input(s): "CHOL", "HDL", "LDLCALC", "TRIG", "CHOLHDL", "LDLDIRECT" in the last 72 hours. Thyroid Function Tests: Recent Labs    04/05/24 0609  TSH 0.689   Anemia Panel: No results for input(s): "VITAMINB12", "FOLATE", "FERRITIN", "TIBC", "IRON", "RETICCTPCT" in the last 72 hours. Urine analysis:    Component Value Date/Time   COLORURINE YELLOW 08/15/2022 1825   APPEARANCEUR CLEAR 08/15/2022 1825   LABSPEC 1.014 08/15/2022 1825   PHURINE 5.0 08/15/2022 1825   GLUCOSEU NEGATIVE 08/15/2022 1825   HGBUR NEGATIVE 08/15/2022 1825   BILIRUBINUR NEGATIVE 08/15/2022 1825   KETONESUR NEGATIVE 08/15/2022 1825   PROTEINUR NEGATIVE 08/15/2022 1825   UROBILINOGEN 0.2 12/11/2014 1120   NITRITE NEGATIVE 08/15/2022 1825   LEUKOCYTESUR SMALL (A) 08/15/2022 1825   Sepsis  Labs: @LABRCNTIP (procalcitonin:4,lacticidven:4)  )No results found for this or any previous visit (from the past 240 hours).   Radiology Studies: DG FEMUR PORT, MIN 2 VIEWS RIGHT Result Date: 04/04/2024 CLINICAL DATA:  Fracture, post ORIF. EXAM: RIGHT FEMUR PORTABLE 2 VIEW COMPARISON:  Preoperative imaging FINDINGS: Lateral plate and screw fixation of femoral shaft fracture. Improved fracture alignment from preoperative imaging. Previous knee and hip arthroplasty. Soft tissue air and edema at the operative site. IMPRESSION: ORIF of femoral shaft fracture. Electronically Signed   By: Chadwick Colonel M.D.   On: 04/04/2024 13:06   DG FEMUR, MIN 2 VIEWS RIGHT Result Date: 04/04/2024 CLINICAL DATA:  Elective surgery. EXAM: RIGHT FEMUR 2 VIEWS COMPARISON:  Preoperative imaging FINDINGS: Six fluoroscopic spot views of the femur submitted from the operating room. Plate and screw fixation  of femoral shaft fracture. Previous knee and hip arthroplasty. Fluoroscopy time 1 minute 24 seconds. Dose 11.99 mGy. IMPRESSION: Intraoperative fluoroscopy during femoral shaft fracture fixation. Electronically Signed   By: Chadwick Colonel M.D.   On: 04/04/2024 11:57   DG C-Arm 1-60 Min-No Report Result Date: 04/04/2024 Fluoroscopy was utilized by the requesting physician.  No radiographic interpretation.   DG C-Arm 1-60 Min-No Report Result Date: 04/04/2024 Fluoroscopy was utilized by the requesting physician.  No radiographic interpretation.     Scheduled Meds:  acetaminophen   650 mg Oral Q6H   Or   acetaminophen   650 mg Rectal Q6H   aspirin  EC  81 mg Oral Daily   diltiazem   120 mg Oral Daily   docusate sodium   100 mg Oral Daily   dorzolamide -timolol   1 drop Both Eyes BID   enoxaparin  (LOVENOX ) injection  40 mg Subcutaneous Q24H   feeding supplement  237 mL Oral BID BM   ferrous sulfate   325 mg Oral Q breakfast   gabapentin   400 mg Oral Daily   latanoprost   1 drop Both Eyes QHS   multivitamin with minerals   1 tablet Oral Daily   pantoprazole   40 mg Oral Daily   polyethylene glycol  17 g Oral Daily   senna  1 tablet Oral Daily   sertraline   50 mg Oral Daily   traZODone   150 mg Oral QHS   Continuous Infusions:     LOS: 3 days    Time spent: 35min    Catharine Clock, MD [facility]   04/06/2024, 7:06 AM

## 2024-04-06 NOTE — Progress Notes (Signed)
 PHARMACY - ANTICOAGULATION CONSULT NOTE  Pharmacy Consult for Eliquis Indication: atrial fibrillation  Allergies  Allergen Reactions   Procaine Rash and Other (See Comments)    Novocaine caused face blisters.    Patient Measurements: Height: 5\' 10"  (177.8 cm) Weight: 97.5 kg (215 lb) IBW/kg (Calculated) : 68.5 HEPARIN  DW (KG): 89.2  Vital Signs:    Labs: Recent Labs    04/04/24 1301 04/05/24 0609 04/06/24 0418  HGB 9.6* 8.5* 8.7*  HCT 30.5* 26.2* 27.4*  PLT 189 171 186  CREATININE 1.91* 1.15* 0.94    Estimated Creatinine Clearance: 49.3 mL/min (by C-G formula based on SCr of 0.94 mg/dL).   Medical History: Past Medical History:  Diagnosis Date   Anemia    Arthritis    Complication of anesthesia    "I GOT PNEUMONIA THE NEXT DAY FROM THE ANESTHESIA"   Depression    Fibromyalgia    GERD (gastroesophageal reflux disease)    History of skin cancer    HLD (hyperlipidemia)    HTN (hypertension)    Internal hemorrhoids    Osteoarthritis    Peripheral neuropathy    Shortness of breath    Skin cancer    SUI (stress urinary incontinence, female)    Ulcerative colitis     Assessment: 69 yoF with atrial fibrillation to start Eliquis 5/4 per pharmacy consult. Weight 97 kg and scr 0.94  Goal of Therapy:  Monitor platelets by anticoagulation protocol: Yes   Plan:  Eliquis 5mg  BID  Monitor for s/sx of bleeding  Angelo Kennedy Genella Bas 04/06/2024,6:05 PM

## 2024-04-06 NOTE — Progress Notes (Signed)
 Orthopaedic Trauma Progress Note  SUBJECTIVE: Doing okay this morning.  Pain controlled at rest.  No chest pain. No SOB. No nausea/vomiting.  Has not had a BM since admission but feels like she needs to have one today.  Does have issues with constipation at home.  Colace and MiraLAX  scheduled.  Will add Dulcolax suppository as needed  OBJECTIVE:  Vitals:   04/05/24 2333 04/06/24 0540  BP: 127/66 (!) 147/82  Pulse: 87 98  Resp: (!) 21 (!) 22  Temp: 98.7 F (37.1 C) 98.2 F (36.8 C)  SpO2: 91% 92%    Opiates Today (MME): Today's  total administered Morphine  Milligram Equivalents: 12 Opiates Yesterday (MME): Yesterday's total administered Morphine  Milligram Equivalents: 7.5  General: Sitting up in bed eating breakfast, no acute distress Respiratory: No increased work of breathing.  Operative Extremity (right lower extremity): Dressing clean, dry, intact.  Tenderness throughout the thigh as expected.  Ankle dorsiflexion/plantarflexion intact.  No significant calf tenderness.  Compartments compressible.  Able to wiggle the toes.  Endorses sensation all aspects of the foot.  +DP pulse  IMAGING: Stable post op imaging.   LABS:  Results for orders placed or performed during the hospital encounter of 04/03/24 (from the past 24 hours)  CBC     Status: Abnormal   Collection Time: 04/06/24  4:18 AM  Result Value Ref Range   WBC 13.0 (H) 4.0 - 10.5 K/uL   RBC 3.00 (L) 3.87 - 5.11 MIL/uL   Hemoglobin 8.7 (L) 12.0 - 15.0 g/dL   HCT 11.9 (L) 14.7 - 82.9 %   MCV 91.3 80.0 - 100.0 fL   MCH 29.0 26.0 - 34.0 pg   MCHC 31.8 30.0 - 36.0 g/dL   RDW 56.2 13.0 - 86.5 %   Platelets 186 150 - 400 K/uL   nRBC 0.0 0.0 - 0.2 %  Basic metabolic panel     Status: Abnormal   Collection Time: 04/06/24  4:18 AM  Result Value Ref Range   Sodium 139 135 - 145 mmol/L   Potassium 4.5 3.5 - 5.1 mmol/L   Chloride 102 98 - 111 mmol/L   CO2 30 22 - 32 mmol/L   Glucose, Bld 99 70 - 99 mg/dL   BUN 25 (H) 8 - 23  mg/dL   Creatinine, Ser 7.84 0.44 - 1.00 mg/dL   Calcium  9.3 8.9 - 10.3 mg/dL   GFR, Estimated 57 (L) >60 mL/min   Anion gap 7 5 - 15    ASSESSMENT: [name] is a 89 y.o. female, 2 Days Post-Op s/p fall Procedures: OPEN REDUCTION INTERNAL FIXATION RIGHT PERIPROSTHETIC FEMUR FRACTURE  CV/Blood loss: Acute blood loss anemia, Hgb 8.7 this AM. Hemodynamically stable  PLAN: Weightbearing: NWB RLE x 4 to 6 weeks ROM: No knee motion restrictions.  Standard joint precautions for post THA Incisional and dressing care: Dressing changes as needed starting 04/07/2024 Showering: Okay to begin getting incisions wet starting 04/08/2024 Orthopedic device(s): None  Pain management: Continue multimodal pain control VTE prophylaxis: Lovenox , SCDs ID:  Ancef  2gm post op completed Foley/Lines:  No foley, KVO IVFs Impediments to Fracture Healing: Fragility fracture.  Vitamin D level looks good Dispo: PT/OT evaluation ongoing, currently recommending CIR. Okay for discharge from ortho standpoint once cleared by medicine team and therapies  D/C recommendations: - Oxycodone , Robaxin , Tylenol  for pain control - Aspirin  81 mg daily for DVT prophylaxis - No additional need for Vit D supplementation  Follow - up plan:  10 to 14 days  after d/c for wound check and repeat x-rays with Dr. Rey Catholic information:  Katheryne Pane MD, Alona Jamaica PA-C. After hours and holidays please check [email or web address] for group call information for [facility]   Edilia Gordon, PA-C [phone] (office) [email or web address]

## 2024-04-06 NOTE — Progress Notes (Signed)
 Physical Therapy Treatment Patient Details Name: [name] MRN: [id number] DOB: [birth year] Today's Date: 04/06/2024   History of Present Illness Pt is a 89 y.o. female admitted 4/30 following a fall at home. Imaging revealed R femur fx. She underwent ORIF 5/1. PMH: normocytic anemia, OA, depression, fibromyalgia, GERD, skin cancer, hyperlipidemia, HTN, internal hemorrhoids, peripheral neuropathy, skin cancer, stress urinary incontinence, ulcerative colitis, h/o B THA/TKA    PT Comments  Pt progressing steadily towards her physical therapy goals and is motivated to participate. Pt reports more pain from neuropathy vs surgical pain and feels disheartened by this. RN present at beginning of session for premedication. Pt able to participate in bed level exercises for RLE strengthening and ROM. Transitioned to edge of bed without physical assist. Attempted to stand up from edge of bed, but pt unable. Therefore, performed lateral scoot from bed to chair towards left with min assist (+2 safety). Verbal cues for weightbearing precautions and head/hip relationship. Patient will benefit from intensive inpatient follow-up therapy, >3 hours/day to address deficits and maximize functional mobility.     If plan is discharge home, recommend the following: A lot of help with bathing/dressing/bathroom;Assistance with cooking/housework;Assist for transportation;Help with stairs or ramp for entrance;Two people to help with walking and/or transfers   Can travel by private vehicle        Equipment Recommendations  Wheelchair (measurements PT);BSC/3in1;Other (comment) (drop arm BSC, slide board)    Recommendations for Other Services       Precautions / Restrictions Precautions Precautions: Fall Restrictions Weight Bearing Restrictions Per Provider Order: Yes RLE Weight Bearing Per Provider Order: Non weight bearing     Mobility  Bed Mobility Overal bed mobility: Needs Assistance Bed Mobility: Supine  to Sit     Supine to sit: Contact guard     General bed mobility comments: Able to maneuver to edge of bed with HOB elevated without physical assist. Increased time/effort    Transfers Overall transfer level: Needs assistance Equipment used: Rolling walker (2 wheels), None Transfers: Sit to/from Stand, Bed to chair/wheelchair/BSC Sit to Stand: Max assist, +2 physical assistance          Lateral/Scoot Transfers: Min assist, +2 safety/equipment General transfer comment: Initially attempted to stand from edge of bed with RW, but pt only able to minimally clear hips. Performed lateral scoot transfer from bed to chair towards left with multiple scoots, cues for head/hip relationship, and minA for positioning hips back into chair.    Ambulation/Gait               General Gait Details: unable   Stairs             Wheelchair Mobility     Tilt Bed    Modified Rankin (Stroke Patients Only)       Balance Overall balance assessment: Needs assistance Sitting-balance support: Feet supported Sitting balance-Leahy Scale: Good                                      Communication Communication Communication: No apparent difficulties  Cognition Arousal: Alert Behavior During Therapy: WFL for tasks assessed/performed   PT - Cognitive impairments: No apparent impairments                         Following commands: Intact      Cueing Cueing Techniques: Verbal cues, Tactile cues, Gestural cues  Exercises General Exercises - Lower Extremity Ankle Circles/Pumps: Both, AROM, 20 reps, Supine Quad Sets: Both, 10 reps, Supine Heel Slides: Right, 5 reps, Supine Hip ABduction/ADduction: AAROM, Right, 5 reps, Supine    General Comments        Pertinent Vitals/Pain Pain Assessment Pain Assessment: Faces Faces Pain Scale: Hurts little more Pain Location: RLE with movement and in hands/legs from neuropathy Pain Descriptors / Indicators:  Grimacing, Operative site guarding, Discomfort, Sore Pain Intervention(s): Limited activity within patient's tolerance, Monitored during session, RN gave pain meds during session    Home Living   Living Arrangements: Children   Type of Home: House                  Prior Function            PT Goals (current goals can now be found in the care plan section) Acute Rehab PT Goals Patient Stated Goal: heal and return to independence Potential to Achieve Goals: Good Progress towards PT goals: Progressing toward goals    Frequency    Min 2X/week      PT Plan      Co-evaluation              AM-PAC PT "6 Clicks" Mobility   Outcome Measure  Help needed turning from your back to your side while in a flat bed without using bedrails?: A Little Help needed moving from lying on your back to sitting on the side of a flat bed without using bedrails?: A Little Help needed moving to and from a bed to a chair (including a wheelchair)?: A Little Help needed standing up from a chair using your arms (e.g., wheelchair or bedside chair)?: Total Help needed to walk in hospital room?: Total Help needed climbing 3-5 steps with a railing? : Total 6 Click Score: 12    End of Session Equipment Utilized During Treatment: Gait belt Activity Tolerance: Patient tolerated treatment well Patient left: in chair;with call bell/phone within reach;with chair alarm set Nurse Communication: Mobility status PT Visit Diagnosis: Other abnormalities of gait and mobility (R26.89);Difficulty in walking, not elsewhere classified (R26.2);Pain Pain - Right/Left: Right Pain - part of body: Leg     Time: 1191-4782 PT Time Calculation (min) (ACUTE ONLY): 35 min  Charges:    $Therapeutic Activity: 23-37 mins PT General Charges $$ ACUTE PT VISIT: 1 Visit                     Verdia Glad, PT, DPT Acute Rehabilitation Services Office [phone]    Claria Crofts 04/06/2024, 12:35 PM

## 2024-04-07 DIAGNOSIS — S72401A Unspecified fracture of lower end of right femur, initial encounter for closed fracture: Secondary | ICD-10-CM | POA: Diagnosis not present

## 2024-04-07 LAB — BASIC METABOLIC PANEL WITH GFR
Anion gap: 8 (ref 5–15)
BUN: 27 mg/dL — ABNORMAL HIGH (ref 8–23)
CO2: 28 mmol/L (ref 22–32)
Calcium: 8.9 mg/dL (ref 8.9–10.3)
Chloride: 103 mmol/L (ref 98–111)
Creatinine, Ser: 0.98 mg/dL (ref 0.44–1.00)
GFR, Estimated: 54 mL/min — ABNORMAL LOW (ref >60)
Glucose, Bld: 107 mg/dL — ABNORMAL HIGH (ref 70–99)
Potassium: 4 mmol/L (ref 3.5–5.1)
Sodium: 139 mmol/L (ref 135–145)

## 2024-04-07 LAB — CBC
HCT: 24.9 % — ABNORMAL LOW (ref 36.0–46.0)
Hemoglobin: 8 g/dL — ABNORMAL LOW (ref 12.0–15.0)
MCH: 29.5 pg (ref 26.0–34.0)
MCHC: 32.1 g/dL (ref 30.0–36.0)
MCV: 91.9 fL (ref 80.0–100.0)
Platelets: 202 10*3/uL (ref 150–400)
RBC: 2.71 MIL/uL — ABNORMAL LOW (ref 3.87–5.11)
RDW: 14.8 % (ref 11.5–15.5)
WBC: 10.6 10*3/uL — ABNORMAL HIGH (ref 4.0–10.5)
nRBC: 0 % (ref 0.0–0.2)

## 2024-04-07 MED ORDER — BISACODYL 10 MG RE SUPP: 10.0000 mg | Freq: Once | RECTAL | Status: DC

## 2024-04-07 MED ORDER — FOLIC ACID 1 MG PO TABS
1.0000 mg | ORAL_TABLET | Freq: Every day | ORAL | Status: DC
  Administered 2024-04-07 – 2024-04-09 (×3): 1 mg via ORAL
  Filled 2024-04-07 (×3): qty 1

## 2024-04-07 NOTE — Plan of Care (Signed)
  Problem: Education: Goal: Knowledge of General Education information will improve Description: Including pain rating scale, medication(s)/side effects and non-pharmacologic comfort measures Outcome: Progressing   Problem: Clinical Measurements: Goal: Will remain free from infection Outcome: Progressing   Problem: Elimination: Goal: Will not experience complications related to bowel motility Outcome: Progressing   Problem: Pain Managment: Goal: General experience of comfort will improve and/or be controlled Outcome: Progressing

## 2024-04-07 NOTE — Progress Notes (Signed)
 Patient requesting an enema.  Patient stated she was taking daily dulcolax tablets at home and had not had a BM for 4 days.  Miralax  and dulcolax suppository was administered per orders with no results.  Patient does have bowel sounds present in all 4 quadrants, no tenderness or distention appreciated.  Patient does have flatulence present.  Dr. Del Favia notified of patient request with orders received.

## 2024-04-07 NOTE — Progress Notes (Signed)
 PROGRESS NOTE    [name]  NGE:[id number] DOB: Apr 09, 1930 DOA: 04/03/2024 PCP: Pcp, No   Hospital course, HPI 89 year old past medical history significant for anemia, depression, fibromyalgia, GERD, hypertension, internal hemorrhoids, osteoarthritis, peripheral neuropathy, ulcerative colitis presented to the ED following a mechanical fall, subsequently injured her right lower leg.  In the ED she was found to be in A-fib with RVR and did converted to sinus rhythm on diltiazem .  X-ray showed hip pelvis oblique fracture of the distal femoral metaphysis.  Orthopedic consulted and patient underwent open reduction internal fixation of the right supracondylar femur fracture using a [employer] Nephew Plate.    Subjective: She is doing ok, had BM. Pain controlled.   Assessment and Plan: Right supracondylar periprosthetic distal femur fracture below total hip arthroplasty; - Orthopedics consulted.  -Patient underwent reduction internal fixation of the right supracondylar femur fracture using a [employer] plate -Per Ortho ok to start Eliquis tomorrow.  -Will benefit from rehab. Awaiting insurance for CIR   Atrial fibrillation with RVR (HCC) -Transient, converted to sinus rhythm -Changed to oral diltiazem  -d/w Cards, they arranged FU 5/8 at 9am -2D echo: Ejection fraction, TSH normal 0.689 -[place] DS2 VAS: 4---4.8% stroke per year  -Plan to start Eliquis today, ok  by ortho.  -Family agrees with anticoagulation, we discussed risk benefit, previous history of fall. They will follow up with cardiology out patient to determine if they will continue indefinitely/   AKI;  -post op , in setting hypotension.  -Cr 1.1 from 2 yesterday Resolved.   Acute Blood loss Anemia:  - Postsurgery expected hemoglobin on admission 11 down to 8.5 today.  Will start iron supplement.  Plan to monitor for now. Of none clinical significance so far Continue to monitor hb, mild drift from yesterday.  Started on  ferrous sulfate  and folic acid.   Depression Continue sertraline  Continue trazodone     Essential hypertension Now on Cardizem , hold lisinopril/hydrochlorothiazide  in setting AKI Holder parameters for Cardizem  to avoid hypotension  GERD Continue pantoprazole  40 mg p.o. daily.   Normocytic anemia Monitor hematocrit and hemoglobin.   Constipation; Continue with Miralax , senna.  ordered  Suppository   Had BM  Advance Care Planning:   Code Status: Full Code    DVT prophylaxis:  Code Status: Full code Family Communication: family at bedside. 5/03 Disposition Plan: May need rehab  Consultants: Orthopedics   Procedures:   Antimicrobials:    Objective: Vitals:   04/05/24 2333 04/06/24 0540 04/06/24 2212 04/07/24 0511  BP: 127/66 (!) 147/82 126/61 (!) 152/83  Pulse: 87 98 97   Resp: (!) 21 (!) 22 20   Temp: 98.7 F (37.1 C) 98.2 F (36.8 C) 99.8 F (37.7 C) 98.4 F (36.9 C)  TempSrc: Oral Tympanic Oral Oral  SpO2: 91% 92% 93%   Weight:      Height:        Intake/Output Summary (Last 24 hours) at 04/07/2024 0759 Last data filed at 04/06/2024 2104 Gross per 24 hour  Intake 840 ml  Output 750 ml  Net 90 ml   Filed Weights   04/03/24 1034  Weight: 97.5 kg    Examination:  General exam: NAD Respiratory system: CTA Cardiovascular system: S 1, S  2RRR Abd: BS present, soft, nt Extremities: no edema, right extremities with clean dressing.      Data Reviewed:   CBC: Recent Labs  Lab 04/04/24 0420 04/04/24 1301 04/05/24 0609 04/06/24 0418 04/07/24 0230  WBC 13.1* 14.6* 13.8* 13.0*  10.6*  HGB 10.4* 9.6* 8.5* 8.7* 8.0*  HCT 32.9* 30.5* 26.2* 27.4* 24.9*  MCV 93.5 93.6 91.6 91.3 91.9  PLT 248 189 171 186 202   Basic Metabolic Panel: Recent Labs  Lab 04/03/24 1101 04/03/24 1404 04/04/24 0420 04/04/24 1301 04/05/24 0609 04/06/24 0418 04/07/24 0230  NA 138  --  139  --  138 139 139  K 3.8  --  4.5  --  4.9 4.5 4.0  CL 103  --  103  --  104 102  103  CO2 25  --  25  --  26 30 28   GLUCOSE 109*  --  137*  --  137* 99 107*  BUN 23  --  26*  --  24* 25* 27*  CREATININE 0.95  --  2.08* 1.91* 1.15* 0.94 0.98  CALCIUM  9.1  --  9.2  --  8.8* 9.3 8.9  MG  --  2.0  --   --   --   --   --   PHOS  --  3.9  --   --   --   --   --    GFR: Estimated Creatinine Clearance: 47.3 mL/min (by C-G formula based on SCr of 0.98 mg/dL). Liver Function Tests: Recent Labs  Lab 04/04/24 0420  AST 24  ALT 15  ALKPHOS 37*  BILITOT 0.7  PROT 5.1*  ALBUMIN  3.1*   No results for input(s): "LIPASE", "AMYLASE" in the last 168 hours. No results for input(s): "AMMONIA" in the last 168 hours. Coagulation Profile: No results for input(s): "INR", "PROTIME" in the last 168 hours. Cardiac Enzymes: No results for input(s): "CKTOTAL", "CKMB", "CKMBINDEX", "TROPONINI" in the last 168 hours. BNP (last 3 results) No results for input(s): "PROBNP" in the last 8760 hours. HbA1C: No results for input(s): "HGBA1C" in the last 72 hours. CBG: No results for input(s): "GLUCAP" in the last 168 hours. Lipid Profile: No results for input(s): "CHOL", "HDL", "LDLCALC", "TRIG", "CHOLHDL", "LDLDIRECT" in the last 72 hours. Thyroid Function Tests: Recent Labs    04/05/24 0609  TSH 0.689   Anemia Panel: No results for input(s): "VITAMINB12", "FOLATE", "FERRITIN", "TIBC", "IRON", "RETICCTPCT" in the last 72 hours. Urine analysis:    Component Value Date/Time   COLORURINE YELLOW 08/15/2022 1825   APPEARANCEUR CLEAR 08/15/2022 1825   LABSPEC 1.014 08/15/2022 1825   PHURINE 5.0 08/15/2022 1825   GLUCOSEU NEGATIVE 08/15/2022 1825   HGBUR NEGATIVE 08/15/2022 1825   BILIRUBINUR NEGATIVE 08/15/2022 1825   KETONESUR NEGATIVE 08/15/2022 1825   PROTEINUR NEGATIVE 08/15/2022 1825   UROBILINOGEN 0.2 12/11/2014 1120   NITRITE NEGATIVE 08/15/2022 1825   LEUKOCYTESUR SMALL (A) 08/15/2022 1825   Sepsis Labs: @LABRCNTIP (procalcitonin:4,lacticidven:4)  )No results found for  this or any previous visit (from the past 240 hours).   Radiology Studies: No results found.    Scheduled Meds:  acetaminophen   650 mg Oral Q6H   Or   acetaminophen   650 mg Rectal Q6H   apixaban  5 mg Oral BID   diltiazem   120 mg Oral Daily   docusate sodium   100 mg Oral Daily   dorzolamide -timolol   1 drop Both Eyes BID   feeding supplement  237 mL Oral BID BM   ferrous sulfate   325 mg Oral Q breakfast   gabapentin   400 mg Oral Daily   latanoprost   1 drop Both Eyes QHS   multivitamin with minerals  1 tablet Oral Daily   pantoprazole   40 mg Oral Daily  polyethylene glycol  17 g Oral BID   senna  1 tablet Oral Daily   sertraline   50 mg Oral Daily   traZODone   150 mg Oral QHS   Continuous Infusions:     LOS: 4 days    Time spent: 35min    Katlin Bortner, MD [facility]   04/07/2024, 7:59 AM

## 2024-04-07 NOTE — Progress Notes (Signed)
 Patient had a moderate amount of stool without administering enema.

## 2024-04-08 ENCOUNTER — Other Ambulatory Visit (HOSPITAL_COMMUNITY): Payer: Self-pay

## 2024-04-08 ENCOUNTER — Inpatient Hospital Stay (HOSPITAL_COMMUNITY)

## 2024-04-08 ENCOUNTER — Telehealth (HOSPITAL_COMMUNITY): Payer: Self-pay | Admitting: Pharmacy Technician

## 2024-04-08 ENCOUNTER — Encounter (HOSPITAL_COMMUNITY): Payer: Self-pay | Admitting: Orthopedic Surgery

## 2024-04-08 DIAGNOSIS — R609 Edema, unspecified: Secondary | ICD-10-CM

## 2024-04-08 DIAGNOSIS — I10 Essential (primary) hypertension: Secondary | ICD-10-CM

## 2024-04-08 LAB — CBC
HCT: 26.5 % — ABNORMAL LOW (ref 36.0–46.0)
Hemoglobin: 8.6 g/dL — ABNORMAL LOW (ref 12.0–15.0)
MCH: 29.3 pg (ref 26.0–34.0)
MCHC: 32.5 g/dL (ref 30.0–36.0)
MCV: 90.1 fL (ref 80.0–100.0)
Platelets: 206 10*3/uL (ref 150–400)
RBC: 2.94 MIL/uL — ABNORMAL LOW (ref 3.87–5.11)
RDW: 14.6 % (ref 11.5–15.5)
WBC: 9.1 10*3/uL (ref 4.0–10.5)
nRBC: 0 % (ref 0.0–0.2)

## 2024-04-08 LAB — BASIC METABOLIC PANEL WITH GFR
Anion gap: 10 (ref 5–15)
BUN: 20 mg/dL (ref 8–23)
CO2: 24 mmol/L (ref 22–32)
Calcium: 9.3 mg/dL (ref 8.9–10.3)
Chloride: 104 mmol/L (ref 98–111)
Creatinine, Ser: 0.84 mg/dL (ref 0.44–1.00)
GFR, Estimated: >60 mL/min (ref >60)
Glucose, Bld: 106 mg/dL — ABNORMAL HIGH (ref 70–99)
Potassium: 3.7 mmol/L (ref 3.5–5.1)
Sodium: 138 mmol/L (ref 135–145)

## 2024-04-08 MED ORDER — FERROUS SULFATE 325 (65 FE) MG PO TABS
325.0000 mg | ORAL_TABLET | Freq: Every day | ORAL | 0 refills | Status: AC
Start: 2024-04-09 — End: ?

## 2024-04-08 MED ORDER — BISACODYL 5 MG PO TBEC
10.0000 mg | DELAYED_RELEASE_TABLET | Freq: Every day | ORAL | 0 refills | Status: AC
Start: 2024-04-08 — End: ?

## 2024-04-08 MED ORDER — APIXABAN 5 MG PO TABS: 5.0000 mg | ORAL_TABLET | Freq: Two times a day (BID) | ORAL | 0 refills | Status: DC

## 2024-04-08 MED ORDER — METOPROLOL TARTRATE 25 MG PO TABS
25.0000 mg | ORAL_TABLET | Freq: Two times a day (BID) | ORAL | Status: DC
Start: 2024-04-08 — End: 2024-04-09
  Administered 2024-04-08 – 2024-04-09 (×3): 25 mg via ORAL
  Filled 2024-04-08 (×3): qty 1

## 2024-04-08 MED ORDER — BISACODYL 10 MG RE SUPP: 10.0000 mg | Freq: Every day | RECTAL | 0 refills | Status: DC | PRN

## 2024-04-08 MED ORDER — BISACODYL 5 MG PO TBEC
10.0000 mg | DELAYED_RELEASE_TABLET | Freq: Every day | ORAL | Status: DC
  Filled 2024-04-08: qty 2

## 2024-04-08 MED ORDER — METOPROLOL TARTRATE 25 MG PO TABS: 25.0000 mg | ORAL_TABLET | Freq: Two times a day (BID) | ORAL | 0 refills | Status: DC

## 2024-04-08 MED ORDER — PREGABALIN 25 MG PO CAPS
25.0000 mg | ORAL_CAPSULE | Freq: Two times a day (BID) | ORAL | Status: DC
  Administered 2024-04-08 – 2024-04-09 (×2): 25 mg via ORAL
  Filled 2024-04-08 (×2): qty 1

## 2024-04-08 MED ORDER — OXYCODONE HCL 5 MG PO TABS: 5.0000 mg | ORAL_TABLET | Freq: Four times a day (QID) | ORAL | 0 refills | Status: DC | PRN

## 2024-04-08 MED ORDER — PREGABALIN 25 MG PO CAPS: 25.0000 mg | ORAL_CAPSULE | Freq: Two times a day (BID) | ORAL | 0 refills | Status: AC

## 2024-04-08 MED ORDER — BISACODYL 10 MG RE SUPP
10.0000 mg | Freq: Once | RECTAL | Status: DC
  Filled 2024-04-08: qty 1

## 2024-04-08 MED ORDER — ENSURE ENLIVE PO LIQD: 237.0000 mL | Freq: Two times a day (BID) | ORAL | 12 refills | Status: DC

## 2024-04-08 MED ORDER — FOLIC ACID 1 MG PO TABS: 1.0000 mg | ORAL_TABLET | Freq: Every day | ORAL | 0 refills | Status: AC

## 2024-04-08 MED ORDER — PREGABALIN 25 MG PO CAPS: 25.0000 mg | ORAL_CAPSULE | Freq: Two times a day (BID) | ORAL | Status: DC

## 2024-04-08 NOTE — Plan of Care (Signed)

## 2024-04-08 NOTE — Progress Notes (Signed)
 Physical Therapy Treatment Patient Details Name: [name] MRN: [id number] DOB: 1930/05/29 Today's Date: 04/08/2024   History of Present Illness Pt is a 89 y.o. female admitted 4/30 following a fall at home. Imaging revealed R femur fx. She underwent ORIF 5/1. PMH: normocytic anemia, OA, depression, fibromyalgia, GERD, skin cancer, hyperlipidemia, HTN, internal hemorrhoids, peripheral neuropathy, skin cancer, stress urinary incontinence, ulcerative colitis, h/o B THA/TKA    PT Comments  Patient progressing with standing transfer using Stedy able to keep weight off R LE.  She was excited and motivated and eager for inpatient rehab.  Supportive friend in the room and multiple family on the phone during session.  Edema R LE so ice placed end of session.  PT will follow.     If plan is discharge home, recommend the following: A lot of help with bathing/dressing/bathroom;Assistance with cooking/housework;Assist for transportation;Help with stairs or ramp for entrance;Two people to help with walking and/or transfers   Can travel by private vehicle        Equipment Recommendations  Wheelchair (measurements PT);BSC/3in1;Other (comment)    Recommendations for Other Services       Precautions / Restrictions Precautions Precautions: Fall Recall of Precautions/Restrictions: Intact Restrictions Weight Bearing Restrictions Per Provider Order: Yes RLE Weight Bearing Per Provider Order: Non weight bearing Other Position/Activity Restrictions: NWB per op note. TDWB per order.     Mobility  Bed Mobility Overal bed mobility: Needs Assistance Bed Mobility: Supine to Sit     Supine to sit: Min assist     General bed mobility comments: assist for R LE and to scoot hip    Transfers Overall transfer level: Needs assistance Equipment used: Ambulation equipment used Transfers: Sit to/from Stand, Bed to chair/wheelchair/BSC             General transfer comment: stood to [place] with mod  A and cues for NWB R LE. seated on flaps to move to recliner and assist for standing from flaps and to remove for A to lower to recliner padded with pillows Transfer via Lift Equipment: Stedy  Ambulation/Gait                   Stairs             Wheelchair Mobility     Tilt Bed    Modified Rankin (Stroke Patients Only)       Balance Overall balance assessment: Needs assistance Sitting-balance support: Feet supported Sitting balance-Leahy Scale: Fair     Standing balance support: Bilateral upper extremity supported Standing balance-Leahy Scale: Poor Standing balance comment: limited by NWB R LE                            Communication Communication Communication: No apparent difficulties  Cognition Arousal: Alert Behavior During Therapy: WFL for tasks assessed/performed   PT - Cognitive impairments: No apparent impairments                         Following commands: Intact      Cueing Cueing Techniques: Verbal cues, Tactile cues, Gestural cues  Exercises General Exercises - Lower Extremity Ankle Circles/Pumps: AROM, 10 reps, Both, Supine Short Arc Quad: AROM, Both, 5 reps, 10 reps, Supine Heel Slides: Right, 5 reps, Supine, AROM, AAROM    General Comments General comments (skin integrity, edema, etc.): VSS on RA, friend in the room; spoke with her grandaughter who was RN here  Loa Riling      Pertinent Vitals/Pain Pain Assessment Pain Assessment: Faces Faces Pain Scale: Hurts little more Pain Location: R LE with movement Pain Descriptors / Indicators: Sore, Tender Pain Intervention(s): Monitored during session, Repositioned, Ice applied    Home Living                          Prior Function            PT Goals (current goals can now be found in the care plan section) Progress towards PT goals: Progressing toward goals    Frequency    Min 2X/week      PT Plan      Co-evaluation               AM-PAC PT "6 Clicks" Mobility   Outcome Measure  Help needed turning from your back to your side while in a flat bed without using bedrails?: A Little Help needed moving from lying on your back to sitting on the side of a flat bed without using bedrails?: A Little Help needed moving to and from a bed to a chair (including a wheelchair)?: Total Help needed standing up from a chair using your arms (e.g., wheelchair or bedside chair)?: A Lot Help needed to walk in hospital room?: Total Help needed climbing 3-5 steps with a railing? : Total 6 Click Score: 11    End of Session Equipment Utilized During Treatment: Gait belt Activity Tolerance: Patient tolerated treatment well Patient left: in chair;with call bell/phone within reach;with chair alarm set Nurse Communication: Mobility status;Need for lift equipment PT Visit Diagnosis: Other abnormalities of gait and mobility (R26.89);Difficulty in walking, not elsewhere classified (R26.2);Pain Pain - Right/Left: Right Pain - part of body: Leg     Time: 1535-1601 PT Time Calculation (min) (ACUTE ONLY): 26 min  Charges:    $Therapeutic Exercise: 8-22 mins $Therapeutic Activity: 8-22 mins PT General Charges $$ ACUTE PT VISIT: 1 Visit                     Abigail Hoff, PT Acute Rehabilitation Services [phone] 04/08/2024    Marley Simmers 04/08/2024, 4:30 PM

## 2024-04-08 NOTE — Telephone Encounter (Signed)
 Patient Product/process development scientist completed.    The patient is insured through [employer]. Patient has Medicare and is not eligible for a copay card, but may be able to apply for patient assistance or Medicare RX Payment Plan (Patient Must reach out to their plan, if eligible for payment plan), if available.    Ran test claim for Eliquis 5 mg and the current 30 day co-pay is $35.00.   This test claim was processed through [facility]- copay amounts may vary at other pharmacies due to pharmacy/plan contracts, or as the patient moves through the different stages of their insurance plan.     Roland Earl, CPHT Pharmacy Technician III Certified Patient Advocate [facility] Patient Advocate Team Direct Number: [phone]  Fax: [phone]

## 2024-04-08 NOTE — Discharge Summary (Signed)
 Physician Discharge Summary   Patient: [name] MRN: [id number] DOB: 1930-01-15  Admit date:     04/03/2024  Discharge date: 04/08/24  Discharge Physician: Charlean Congress  PCP: Pcp, No  Recommendations at discharge:  Follow up with PCP in 1 week   Follow-up Information     Hardy Lia, MD. Schedule an appointment as soon as possible for a visit in 2 week(s).   Specialty: Orthopedic Surgery Contact information: [address] [place] [address] [phone]         PCP. Schedule an appointment as soon as possible for a visit in 1 week(s).                 Discharge Diagnoses: Principal Problem:   Essential hypertension Active Problems:   Hyperlipidemia   Depression   GERD   Normocytic anemia   Hyperglycemia   Closed displaced oblique fracture of shaft of right femur (HCC)   Atrial fibrillation with RVR (HCC)   New onset atrial fibrillation (HCC)   Glaucoma   Aortic atherosclerosis [facility])    Hospital Course: 89 year old past medical history significant for anemia, depression, fibromyalgia, GERD, hypertension, internal hemorrhoids, osteoarthritis, peripheral neuropathy, ulcerative colitis presented to the ED following a mechanical fall, subsequently injured her right lower leg.  In the ED she was found to be in A-fib with RVR and did converted to sinus rhythm on diltiazem .   X-ray showed hip pelvis oblique fracture of the distal femoral metaphysis.  Orthopedic consulted and patient underwent open reduction internal fixation of the right supracondylar femur fracture using a Smith Nephew Plate.   Assessment and Plan: Right supracondylar periprosthetic distal femur fracture below total hip arthroplasty; Orthopedics consulted. Patient underwent reduction internal fixation of the right supracondylar femur fracture using a Smith [employer]hew plate Per Ortho ok to start Eliquis PT OT recommended CIR.  Insurance approved to stay.  CIR will admit the patient on  5/6.   Paroxysmal atrial fibrillation with RVR (HCC) Transient,  Required Cardizem  drip.  Converted to sinus rhythm. Changed to oral diltiazem  Prior provider discussed with cardiology.  d/w Cards, they arranged FU 5/8 at 9am [place] DS2 VAS: 4 Patient was started on Eliquis.  Tolerating it well. Prior provider discussed anticoagulation with family.discussed risk benefit, previous history of fall. They will follow up with cardiology out patient to determine if they will continue indefinitely  LVH with intracavitary gradient. Will switch Cardizem  to to Lopressor .  Right lower extremity edema. Postoperative blood Ultrasound venous Doppler was performed which was negative for any DVT.  Atelectasis. Postop.  Patient had some hypoxia. Does not use oxygen at her baseline. A chest x-ray was performed which was negative for any significant infiltrate. Suspect this was atelectasis. Continue incentive spirometry.  Constipation. Patient reported some abdominal distention and constipation. Tells me that he takes Dulcolax on a daily basis at home. Tells me that she has not been passing gas today. X-ray abdomen was performed which was unremarkable for any acute abnormality. Moderate stool burden.  Resume home medication for bowel regimen. No obstruction.   AKI;  post op , in setting hypotension.  Baseline serum creatinine 0.9.  Serum creatinine on 5/1 went up to 2.28. Improving back to 0.8 again.  Acute Blood loss Anemia:  Postsurgery expected hemoglobin on admission 11 down to 8s Continue iron and vitamin supplementation.   Depression Continue sertraline  Continue trazodone     Essential hypertension Home regimen includes lisinopril HCTZ. In the setting of AKI would discontinue this  medication. Was started on Cardizem  for A-fib. Given presence of intraventricular gradient switch to Lopressor .   GERD Continue pantoprazole  40 mg p.o. daily.  Obesity. Body mass index is 30.85 kg/m.   Placing the patient at a high risk of poor outcome.  Leukocytosis. Postop Improving.  Chronic neuropathy. Patient is on gabapentin  at home.  She does not think that it is helping her. Would like to switch to Lyrica . Will initiate 25 mg twice daily Lyrica  which will be equivalent to 300 mg of gabapentin .  Pain control - [place]  Controlled Substance Reporting System database was reviewed. and patient was instructed, not to drive, operate heavy machinery, perform activities at heights, swimming or participation in water activities or provide baby-sitting services while on Pain, Sleep and Anxiety Medications; until their outpatient Physician has advised to do so again. Also recommended to not to take more than prescribed Pain, Sleep and Anxiety Medications.  Consultants:  Orthopedics   Procedures performed:  OPEN REDUCTION INTERNAL FIXATION OF RIGHT SUPRACONDYLAR FEMUR   DISCHARGE MEDICATION: Allergies as of 04/08/2024       Reactions   Procaine Rash, Other (See Comments)   Novocaine caused face blisters.        Medication List     STOP taking these medications    aspirin  EC 81 MG tablet   gabapentin  400 MG capsule Commonly known as: NEURONTIN    lisinopril-hydrochlorothiazide  10-12.5 MG tablet Commonly known as: ZESTORETIC       TAKE these medications    acetaminophen  650 MG CR tablet Commonly known as: TYLENOL  Take 1,300 mg by mouth daily as needed for pain.   apixaban 5 MG Tabs tablet Commonly known as: ELIQUIS Take 1 tablet (5 mg total) by mouth 2 (two) times daily.   bisacodyl  5 MG EC tablet Commonly known as: DULCOLAX Take 2 tablets (10 mg total) by mouth at bedtime.   bisacodyl  10 MG suppository Commonly known as: DULCOLAX Place 1 suppository (10 mg total) rectally daily as needed for moderate constipation.   docusate sodium  100 MG capsule Commonly known as: COLACE Take 100 mg by mouth daily.   dorzolamide -timolol  2-0.5 % ophthalmic  solution Commonly known as: COSOPT  Place 1 drop into both eyes 2 (two) times daily.   estradiol  0.0375 mg/24hr patch Commonly known as: CLIMARA  - Dosed in mg/24 hr Place 0.0375 mg onto the skin once a week.   feeding supplement Liqd Take 237 mLs by mouth 2 (two) times daily between meals. Start taking on: Apr 09, 2024   ferrous sulfate  325 (65 FE) MG tablet Take 1 tablet (325 mg total) by mouth daily with breakfast. Start taking on: Apr 09, 2024   folic acid 1 MG tablet Commonly known as: FOLVITE Take 1 tablet (1 mg total) by mouth daily. Start taking on: Apr 09, 2024   Lumigan 0.01 % Soln Generic drug: bimatoprost Place 1 drop into both eyes at bedtime.   metoprolol  tartrate 25 MG tablet Commonly known as: LOPRESSOR  Take 1 tablet (25 mg total) by mouth 2 (two) times daily.   omeprazole  40 MG capsule Commonly known as: PRILOSEC Take 1 capsule (40 mg total) by mouth daily.   oxyCODONE  5 MG immediate release tablet Commonly known as: Oxy IR/ROXICODONE  Take 1 tablet (5 mg total) by mouth every 6 (six) hours as needed for moderate pain (pain score 4-6) or severe pain (pain score 7-10).   pregabalin  25 MG capsule Commonly known as: LYRICA  Take 1 capsule (25 mg total) by mouth 2 (two)  times daily.   sertraline  50 MG tablet Commonly known as: ZOLOFT  Take 50 mg by mouth daily.   traZODone  100 MG tablet Commonly known as: DESYREL  Take 200 mg by mouth at bedtime.   Vitamin D (Ergocalciferol) 1.25 MG (50000 UNIT) Caps capsule Commonly known as: DRISDOL Take 50,000 Units by mouth once a week.               Discharge Care Instructions  (From admission, onward)           Start     Ordered   04/08/24 0000  Discharge wound care:       Comments: Reinforce dressing   04/08/24 1626           Disposition: CIR Diet recommendation: Cardiac diet  Discharge Exam: Vitals:   04/08/24 0846 04/08/24 0900 04/08/24 1054 04/08/24 1100  BP: (!) 104/92 116/72 122/77  122/77  Pulse: 92  86   Resp: 18   19  Temp: 98.3 F (36.8 C)   97.8 F (36.6 C)  TempSrc: Oral   Oral  SpO2:      Weight:      Height:       General: Appear in mild distress; no visible Abnormal Neck Mass Or lumps, Conjunctiva normal Cardiovascular: S1 and S2 Present, no Murmur, Respiratory: good respiratory effort, Bilateral Air entry present and faint basal Crackles, no wheezes Abdomen: Bowel Sound present, Non tender  Extremities: right Pedal edema Neurology: alert and oriented to time, place, and person  Filed Weights   04/03/24 1034  Weight: 97.5 kg   Condition at discharge: stable  The results of significant diagnostics from this hospitalization (including imaging, microbiology, ancillary and laboratory) are listed below for reference.   Imaging Studies: DG Abd Portable 1V Result Date: 04/08/2024 CLINICAL DATA:  Shortness of breath, constipation EXAM: PORTABLE ABDOMEN - 1 VIEW COMPARISON:  CT abdomen 08/15/2022 : The amount of gas and formed stool in the colon is within normal limits. No dilated bowel observed. Bilateral total hip prostheses noted. Lower lumbar spondylosis and degenerative disc disease. The lung bases appear clear. IMPRESSION: 1. No acute findings. 2. Lower lumbar spondylosis and degenerative disc disease. Electronically Signed   By: Freida Jes M.D.   On: 04/08/2024 13:51   DG CHEST PORT 1 VIEW Result Date: 04/08/2024 CLINICAL DATA:  Shortness of breath EXAM: PORTABLE CHEST 1 VIEW COMPARISON:  Chest radiograph dated 04/03/2024 FINDINGS: Patient is rotated slightly to the right. Normal lung volumes. Bibasilar patchy opacities. No pleural effusion or pneumothorax. The heart size and mediastinal contours are within normal limits. No acute osseous abnormality. IMPRESSION: Bibasilar patchy opacities, likely atelectasis. Aspiration or pneumonia can be considered in the appropriate clinical setting. Electronically Signed   By: Limin  Xu M.D.   On: 04/08/2024 13:50    DG FEMUR PORT, MIN 2 VIEWS RIGHT Result Date: 04/04/2024 CLINICAL DATA:  Fracture, post ORIF. EXAM: RIGHT FEMUR PORTABLE 2 VIEW COMPARISON:  Preoperative imaging FINDINGS: Lateral plate and screw fixation of femoral shaft fracture. Improved fracture alignment from preoperative imaging. Previous knee and hip arthroplasty. Soft tissue air and edema at the operative site. IMPRESSION: ORIF of femoral shaft fracture. Electronically Signed   By: Chadwick Colonel M.D.   On: 04/04/2024 13:06   DG FEMUR, MIN 2 VIEWS RIGHT Result Date: 04/04/2024 CLINICAL DATA:  Elective surgery. EXAM: RIGHT FEMUR 2 VIEWS COMPARISON:  Preoperative imaging FINDINGS: Six fluoroscopic spot views of the femur submitted from the operating room. Plate and screw fixation  of femoral shaft fracture. Previous knee and hip arthroplasty. Fluoroscopy time 1 minute 24 seconds. Dose 11.99 mGy. IMPRESSION: Intraoperative fluoroscopy during femoral shaft fracture fixation. Electronically Signed   By: Chadwick Colonel M.D.   On: 04/04/2024 11:57   DG C-Arm 1-60 Min-No Report Result Date: 04/04/2024 Fluoroscopy was utilized by the requesting physician.  No radiographic interpretation.   DG C-Arm 1-60 Min-No Report Result Date: 04/04/2024 Fluoroscopy was utilized by the requesting physician.  No radiographic interpretation.   ECHOCARDIOGRAM COMPLETE Result Date: 04/03/2024    ECHOCARDIOGRAM REPORT   Patient Name:   MAYTAL CROLEY Date of Exam: 04/03/2024 Medical Rec #:  [id number]        Height:       70.0 in Accession #:    [id number]       Weight:       215.0 lb Date of Birth:  05-Feb-1930        BSA:          2.152 m Patient Age:    89 years         BP:           92/55 mmHg Patient Gender: F                HR:           114 bpm. Exam Location:  Inpatient Procedure: 2D Echo, Cardiac Doppler and Color Doppler (Both Spectral and Color            Flow Doppler were utilized during procedure). Indications:    Arrhythmia  History:        Patient has no  prior history of Echocardiogram examinations.                 Signs/Symptoms:Shortness of Breath; Risk Factors:Hypertension.  Sonographer:    Astrid Blamer Referring Phys: [id number] DAVID MANUEL ORTIZ IMPRESSIONS  1. Intracavitary gradient. Peak velocity 2.48 m/s. Peak gradient 24.7 mmHg. Left ventricular ejection fraction, by estimation, is 60 to 65%. The left ventricle has normal function. The left ventricle has no regional wall motion abnormalities. There is moderate asymmetric left ventricular hypertrophy of the septal segment. Left ventricular diastolic function could not be evaluated.  2. Right ventricular systolic function is normal. The right ventricular size is normal.  3. Left atrial size was moderately dilated.  4. The mitral valve is normal in structure. Trivial mitral valve regurgitation. No evidence of mitral stenosis.  5. The aortic valve is tricuspid. There is mild calcification of the aortic valve. There is mild thickening of the aortic valve. Aortic valve regurgitation is not visualized. No aortic stenosis is present. FINDINGS  Left Ventricle: Intracavitary gradient. Peak velocity 2.48 m/s. Peak gradient 24.7 mmHg. Left ventricular ejection fraction, by estimation, is 60 to 65%. The left ventricle has normal function. The left ventricle has no regional wall motion abnormalities. The left ventricular internal cavity size was normal in size. There is moderate asymmetric left ventricular hypertrophy of the septal segment. Left ventricular diastolic function could not be evaluated due to atrial fibrillation. Left ventricular diastolic function could not be evaluated. Indeterminate filling pressures. Right Ventricle: The right ventricular size is normal. No increase in right ventricular wall thickness. Right ventricular systolic function is normal. Left Atrium: Left atrial size was moderately dilated. Right Atrium: Right atrial size was normal in size. Pericardium: There is no evidence of pericardial  effusion. Mitral Valve: The mitral valve is normal in structure. Trivial mitral valve regurgitation. No evidence of mitral  valve stenosis. Tricuspid Valve: The tricuspid valve is normal in structure. Tricuspid valve regurgitation is trivial. No evidence of tricuspid stenosis. Aortic Valve: The aortic valve is tricuspid. There is mild calcification of the aortic valve. There is mild thickening of the aortic valve. Aortic valve regurgitation is not visualized. No aortic stenosis is present. Aortic valve peak gradient measures 8.8 mmHg. Pulmonic Valve: The pulmonic valve was normal in structure. Pulmonic valve regurgitation is not visualized. No evidence of pulmonic stenosis. Aorta: The aortic root is normal in size and structure. Venous: The inferior vena cava was not well visualized. IAS/Shunts: No atrial level shunt detected by color flow Doppler.  LEFT VENTRICLE PLAX 2D LVIDd:         3.80 cm   Diastology LVIDs:         2.20 cm   LV e' medial:    8.16 cm/s LV PW:         0.90 cm   LV E/e' medial:  12.1 LV IVS:        1.40 cm   LV e' lateral:   11.20 cm/s LVOT diam:     1.80 cm   LV E/e' lateral: 8.8 LV SV:         47 LV SV Index:   22 LVOT Area:     2.54 cm  RIGHT VENTRICLE RV S prime:     11.70 cm/s TAPSE (M-mode): 1.5 cm LEFT ATRIUM           Index        RIGHT ATRIUM           Index LA Vol (A4C): 85.1 ml 39.54 ml/m  RA Area:     10.20 cm                                    RA Volume:   19.90 ml  9.25 ml/m  AORTIC VALVE AV Area (Vmax): 2.34 cm AV Vmax:        148.00 cm/s AV Peak Grad:   8.8 mmHg LVOT Vmax:      136.00 cm/s LVOT Vmean:     99.100 cm/s LVOT VTI:       0.183 m  AORTA Ao Root diam: 3.30 cm Ao Asc diam:  3.20 cm MITRAL VALVE               TRICUSPID VALVE MV Area (PHT): 3.10 cm    TR Peak grad:   9.6 mmHg MV E velocity: 98.50 cm/s  TR Vmax:        155.00 cm/s                             SHUNTS                            Systemic VTI:  0.18 m                            Systemic Diam: 1.80 cm  Maudine Sos MD Electronically signed by Maudine Sos MD Signature Date/Time: 04/03/2024/7:25:16 PM    Final    DG Hip Unilat W or Wo Pelvis 2-3 Views Right Result Date: 04/03/2024 CLINICAL DATA:  Fall.  Shortening rotation of the right leg. EXAM: RIGHT FEMUR 2 VIEWS; DG HIP (WITH OR  WITHOUT PELVIS) 2-3V RIGHT; RIGHT KNEE - COMPLETE 4+ VIEW COMPARISON:  Right hip radiographs 07/05/2022. FINDINGS: And oblique fracture of the distal femoral metaphysis is present just above the femoral component of the total knee arthroplasty. The fracture is displaced laterally and slightly posteriorly. Valgus angulation is noted. The right hip arthroplasty is intact. No fractures are present adjacent to the arthroplasty. The knee arthroplasty is also intact. IMPRESSION: 1. Oblique fracture of the distal femoral metaphysis just above the femoral component of the total knee arthroplasty. 2. The right hip and knee arthroplasty are intact. Electronically Signed   By: Audree Leas M.D.   On: 04/03/2024 11:18   DG Knee Complete 4 Views Right Result Date: 04/03/2024 CLINICAL DATA:  Fall.  Shortening rotation of the right leg. EXAM: RIGHT FEMUR 2 VIEWS; DG HIP (WITH OR WITHOUT PELVIS) 2-3V RIGHT; RIGHT KNEE - COMPLETE 4+ VIEW COMPARISON:  Right hip radiographs 07/05/2022. FINDINGS: And oblique fracture of the distal femoral metaphysis is present just above the femoral component of the total knee arthroplasty. The fracture is displaced laterally and slightly posteriorly. Valgus angulation is noted. The right hip arthroplasty is intact. No fractures are present adjacent to the arthroplasty. The knee arthroplasty is also intact. IMPRESSION: 1. Oblique fracture of the distal femoral metaphysis just above the femoral component of the total knee arthroplasty. 2. The right hip and knee arthroplasty are intact. Electronically Signed   By: Audree Leas M.D.   On: 04/03/2024 11:18   DG Femur Min 2 Views Right Result  Date: 04/03/2024 CLINICAL DATA:  Fall.  Shortening rotation of the right leg. EXAM: RIGHT FEMUR 2 VIEWS; DG HIP (WITH OR WITHOUT PELVIS) 2-3V RIGHT; RIGHT KNEE - COMPLETE 4+ VIEW COMPARISON:  Right hip radiographs 07/05/2022. FINDINGS: And oblique fracture of the distal femoral metaphysis is present just above the femoral component of the total knee arthroplasty. The fracture is displaced laterally and slightly posteriorly. Valgus angulation is noted. The right hip arthroplasty is intact. No fractures are present adjacent to the arthroplasty. The knee arthroplasty is also intact. IMPRESSION: 1. Oblique fracture of the distal femoral metaphysis just above the femoral component of the total knee arthroplasty. 2. The right hip and knee arthroplasty are intact. Electronically Signed   By: Audree Leas M.D.   On: 04/03/2024 11:18   DG Chest Portable 1 View Result Date: 04/03/2024 CLINICAL DATA:  Marvell Slider. EXAM: PORTABLE CHEST 1 VIEW COMPARISON:  08/15/2022 FINDINGS: Normal sized heart. Tortuous and partially calcified thoracic aorta. Clear lungs with normal vascularity. No fracture or pneumothorax seen. Mild-to-moderate right glenohumeral degenerative changes. IMPRESSION: No acute abnormality. Electronically Signed   By: Catherin Closs M.D.   On: 04/03/2024 11:17    Microbiology: Results for orders placed or performed during the hospital encounter of 08/15/22  Blood Culture (routine x 2)     Status: None   Collection Time: 08/15/22  3:58 PM   Specimen: BLOOD  Result Value Ref Range Status   Specimen Description   Final    BLOOD BLOOD RIGHT HAND Performed at [facility], [address], [place], [place] [address]    Special Requests   Final    Blood Culture adequate volume BOTTLES DRAWN AEROBIC AND ANAEROBIC Performed at [facility], [address] [place], [place] [address]    Culture   Final    NO GROWTH 5 DAYS Performed at [facility], [address] [place], [place] [address]    Report Status 08/20/2022 FINAL  Final  Blood Culture (routine x 2)     Status: None   Collection Time: 08/15/22  4:03 PM   Specimen: BLOOD  Result Value Ref Range Status   Specimen Description   Final    BLOOD BLOOD RIGHT FOREARM Performed at [facility], [address] [place], [place] [address]    Special Requests   Final    BOTTLES DRAWN AEROBIC AND ANAEROBIC Blood Culture adequate volume Performed at [facility], [address], [place], [place] [address]    Culture   Final    NO GROWTH 5 DAYS Performed at [facility], [address], [place], [place] [address]    Report Status 08/20/2022 FINAL  Final  SARS Coronavirus 2 by RT PCR (hospital order, performed in [facility] hospital lab) *cepheid single result test* Anterior Nasal Swab     Status: None   Collection Time: 08/16/22  4:23 AM   Specimen: Anterior Nasal Swab  Result Value Ref Range Status   SARS Coronavirus 2 by RT PCR NEGATIVE NEGATIVE Final    Comment: (NOTE) SARS-CoV-2 target nucleic acids are NOT DETECTED.  The SARS-CoV-2 RNA is generally detectable in upper and lower respiratory specimens during the acute phase of infection. The lowest concentration of SARS-CoV-2 viral copies this assay can detect is 250 copies / mL. A negative result does not preclude SARS-CoV-2 infection and should not be used as the sole basis for treatment or other patient management decisions.  A negative result may occur with improper specimen collection / handling, submission of specimen other than nasopharyngeal swab, presence of viral mutation(s) within the areas targeted by this assay, and inadequate number of viral copies (<250 copies / mL). A negative result must be combined with clinical observations, patient history, and epidemiological information.  Fact Sheet for Patients:   RoadLapTop.co.za  Fact Sheet for  Healthcare Providers: http://kim-miller.com/  This test is not yet approved or  cleared by the [place]  FDA and has been authorized for detection and/or diagnosis of SARS-CoV-2 by FDA under an Emergency Use Authorization (EUA).  This EUA will remain in effect (meaning this test can be used) for the duration of the COVID-19 declaration under Section 564(b)(1) of the Act, 21 U.S.C. section 360bbb-3(b)(1), unless the authorization is terminated or revoked sooner.  Performed at [facility], [address], [place], [place] [address]    Labs: CBC: Recent Labs  Lab 04/04/24 1301 04/05/24 0609 04/06/24 0418 04/07/24 0230 04/08/24 0624  WBC 14.6* 13.8* 13.0* 10.6* 9.1  HGB 9.6* 8.5* 8.7* 8.0* 8.6*  HCT 30.5* 26.2* 27.4* 24.9* 26.5*  MCV 93.6 91.6 91.3 91.9 90.1  PLT 189 171 186 202 206   Basic Metabolic Panel: Recent Labs  Lab 04/03/24 1404 04/04/24 0420 04/04/24 1301 04/05/24 0609 04/06/24 0418 04/07/24 0230 04/08/24 0624  NA  --  139  --  138 139 139 138  K  --  4.5  --  4.9 4.5 4.0 3.7  CL  --  103  --  104 102 103 104  CO2  --  25  --  26 30 28 24   GLUCOSE  --  137*  --  137* 99 107* 106*  BUN  --  26*  --  24* 25* 27* 20  CREATININE  --  2.08* 1.91* 1.15* 0.94 0.98 0.84  CALCIUM   --  9.2  --  8.8* 9.3 8.9 9.3  MG 2.0  --   --   --   --   --   --  PHOS 3.9  --   --   --   --   --   --    Liver Function Tests: Recent Labs  Lab 04/04/24 0420  AST 24  ALT 15  ALKPHOS 37*  BILITOT 0.7  PROT 5.1*  ALBUMIN  3.1*   CBG: No results for input(s): "GLUCAP" in the last 168 hours.  Discharge time spent: greater than 30 minutes.  Author: Charlean Congress, MD  [facility]

## 2024-04-08 NOTE — H&P (Signed)
 Physical Medicine and Rehabilitation Admission H&P    Chief Complaint  Patient presents with   Leg Injury  : HPI: [name] is a 89 year old right-handed female with history significant for normocytic anemia, osteoarthritis with bilateral knee and bilateral hip replacement, obesity with BMI 30.85, depression, fibromyalgia, GERD/ulcerative colitis, skin cancer, hyperlipidemia, hypertension, internal hemorrhoids, peripheral neuropathy, stress urinary incontinence.  Per chart review patient lives with son.  Two-level home with ramp to main entrance.  Used a rolling walker inside the home for mobility and electric scooter in the community.  Presented 04/03/2024 after mechanical fall when getting out of bed to answer the phone landing on her right hip.  X-rays and imaging revealed right supracondylar periprosthetic distal femur fracture below total hip arthroplasty.  Patient went into atrial fibrillation with RVR while in the ED.  Admission chemistries unremarkable except WBC 11,100, glucose 109, troponin negative.  The patient received diltiazem  15 mg IVP followed by continuous diltiazem  infusion for atrial fibrillation.  Echocardiogram with ejection fraction of 60 to 65% no wall motion abnormalities.  Patient was transition to p.o. Cardizem  and case discussed with cardiology services plan to follow-up outpatient..  Patient was stabilized and underwent right open reduction internal fixation of right supracondylar femur fracture using Smith nephew plate 01/14/5620 per Dr. Guyann Leitz 04/04/2024.  Patient was cleared to begin Eliquis for both DVT prophylaxis and transient atrial fibrillation that they would remain on until follow-up with cardiology services.  Hospital course acute blood loss anemia 8.0 and monitored. Mild AKI 2.08 improved with fluids and lattest CRT 0.84 Therapy evaluations initiated patient TDWB right lower extremity.  Therapy evaluations completed with recommendations of inpatient rehab  services due to patient's decreased functional mobility  Review of Systems  Constitutional:  Negative for chills and fever.  HENT:  Negative for hearing loss.   Eyes:  Negative for blurred vision and double vision.  Respiratory:  Negative for cough and wheezing.        Occasional shortness of breath with exertion  Cardiovascular:  Positive for palpitations and leg swelling. Negative for chest pain.  Gastrointestinal:        GERD  Genitourinary:  Negative for dysuria, flank pain and hematuria.  Musculoskeletal:  Positive for falls, joint pain and myalgias.  Skin:  Negative for rash.  Psychiatric/Behavioral:  Positive for depression. The patient has insomnia.   All other systems reviewed and are negative.  Past Medical History:  Diagnosis Date   Anemia    Arthritis    Complication of anesthesia    "I GOT PNEUMONIA THE NEXT DAY FROM THE ANESTHESIA"   Depression    Fibromyalgia    GERD (gastroesophageal reflux disease)    History of skin cancer    HLD (hyperlipidemia)    HTN (hypertension)    Internal hemorrhoids    Osteoarthritis    Peripheral neuropathy    Shortness of breath    Skin cancer    SUI (stress urinary incontinence, female)    Ulcerative colitis    Past Surgical History:  Procedure Laterality Date   ABDOMINAL HYSTERECTOMY     APPENDECTOMY     BREAST EXCISIONAL BIOPSY Left 08/10/2011    Dr Linell Rhymes   BREAST SURGERY     CARPAL TUNNEL RELEASE Left    CESAREAN SECTION     CHOLECYSTECTOMY     COLONOSCOPY  04/29/2003   internal hemorrhoids   JOINT REPLACEMENT Bilateral 2010 and 2012   KNEES   TOTAL HIP ARTHROPLASTY Right 08/06/2014  Procedure: RIGHT TOTAL HIP ARTHROPLASTY ANTERIOR APPROACH;  Surgeon: Aurther Blue, MD;  Location: [facility];  Service: Orthopedics;  Laterality: Right;   TOTAL HIP ARTHROPLASTY Left 12/17/2014   Procedure: LEFT TOTAL HIP ARTHROPLASTY ANTERIOR APPROACH;  Surgeon: Aurther Blue, MD;  Location: [facility];  Service: Orthopedics;  Laterality:  Left;   Family History  Problem Relation Age of Onset   Colon cancer Mother 42   Diabetes Brother    Social History:  reports that she has never smoked. She has never used smokeless tobacco. She reports that she does not drink alcohol and does not use drugs. Allergies:  Allergies  Allergen Reactions   Procaine Rash and Other (See Comments)    Novocaine caused face blisters.   Medications Prior to Admission  Medication Sig Dispense Refill   acetaminophen  (TYLENOL ) 650 MG CR tablet Take 1,300 mg by mouth daily as needed for pain.     aspirin  EC 81 MG tablet Take 81 mg by mouth daily.     bimatoprost (LUMIGAN) 0.01 % SOLN Place 1 drop into both eyes at bedtime.     docusate sodium  (COLACE) 100 MG capsule Take 100 mg by mouth daily.     dorzolamide -timolol  (COSOPT ) 22.3-6.8 MG/ML ophthalmic solution Place 1 drop into both eyes 2 (two) times daily.     estradiol  (CLIMARA  - DOSED IN MG/24 HR) 0.0375 mg/24hr patch Place 0.0375 mg onto the skin once a week.     gabapentin  (NEURONTIN ) 400 MG capsule Take 400 mg by mouth daily.     lisinopril-hydrochlorothiazide  (ZESTORETIC) 10-12.5 MG tablet Take 1 tablet by mouth daily.     omeprazole  (PRILOSEC) 40 MG capsule Take 1 capsule (40 mg total) by mouth daily. 30 capsule 0   sertraline  (ZOLOFT ) 50 MG tablet Take 50 mg by mouth daily.     traZODone  (DESYREL ) 100 MG tablet Take 200 mg by mouth at bedtime.      Vitamin D, Ergocalciferol, (DRISDOL) 1.25 MG (50000 UNIT) CAPS capsule Take 50,000 Units by mouth once a week.        Home: Home Living Family/patient expects to be discharged to:: Private residence Living Arrangements: Children Available Help at Discharge: Family, Available PRN/intermittently Type of Home: House Home Access: Level entry Home Layout: Two level, Other (Comment) (ramp to main level of house) Bathroom Shower/Tub: Engineer, manufacturing systems: Standard Bathroom Accessibility: No Home Equipment: Art gallery manager,  Agricultural consultant (2 wheels), Rollator (4 wheels), Shower seat, Other (comment) (adjustable bed)  Lives With: Son (daughter-in-law)   Functional History: Prior Function Prior Level of Function : Independent/Modified Independent, Driving Mobility Comments: RW vs rollator in home. Electric scooter outside. ADLs Comments: sits down in tub to bathe  Functional Status:  Mobility: Bed Mobility Overal bed mobility: Needs Assistance Bed Mobility: Supine to Sit Rolling: Min assist, Used rails Supine to sit: Contact guard Sit to supine: Mod assist, Used rails General bed mobility comments: Able to maneuver to edge of bed with HOB elevated without physical assist. Increased time/effort Transfers Overall transfer level: Needs assistance Equipment used: Rolling walker (2 wheels), None Transfers: Sit to/from Stand, Bed to chair/wheelchair/BSC Sit to Stand: Max assist, +2 physical assistance Bed to/from chair/wheelchair/BSC transfer type:: Lateral/scoot transfer  Lateral/Scoot Transfers: Min assist, +2 safety/equipment General transfer comment: Initially attempted to stand from edge of bed with RW, but pt only able to minimally clear hips. Performed lateral scoot transfer from bed to chair towards left with multiple scoots, cues for head/hip relationship, and minA for  positioning hips back into chair. Ambulation/Gait General Gait Details: unable    ADL: ADL Overall ADL's : Needs assistance/impaired Eating/Feeding: Independent, Sitting Eating/Feeding Details (indicate cue type and reason): increased spillage due to tremor Grooming: Set up, Sitting Upper Body Bathing: Minimal assistance, Sitting Lower Body Bathing: Total assistance, Bed level Upper Body Dressing : Set up, Sitting Lower Body Dressing: Total assistance, Bed level Toileting- Clothing Manipulation and Hygiene: Total assistance, Bed level Functional mobility during ADLs: Moderate assistance, +2 for safety/equipment (simulated lateral  scoot at EOB)  Cognition: Cognition Orientation Level: Oriented X4 Cognition Arousal: Alert Behavior During Therapy: WFL for tasks assessed/performed  Physical Exam: Blood pressure (!) 135/95, pulse (!) 103, temperature 98.5 F (36.9 C), temperature source Oral, resp. rate (!) 24, height 5\' 10"  (1.778 m), weight 97.5 kg, SpO2 98%. Physical Exam Skin:    Comments: Right hip incision is dressed.  Appropriately tender  Neurological:     Comments: Patient is alert makes eye contact with examiner.  Follows full commands.     Results for orders placed or performed during the hospital encounter of 04/03/24 (from the past 48 hours)  CBC     Status: Abnormal   Collection Time: 04/07/24  2:30 AM  Result Value Ref Range   WBC 10.6 (H) 4.0 - 10.5 K/uL   RBC 2.71 (L) 3.87 - 5.11 MIL/uL   Hemoglobin 8.0 (L) 12.0 - 15.0 g/dL   HCT 16.1 (L) 09.6 - 04.5 %   MCV 91.9 80.0 - 100.0 fL   MCH 29.5 26.0 - 34.0 pg   MCHC 32.1 30.0 - 36.0 g/dL   RDW 40.9 81.1 - 91.4 %   Platelets 202 150 - 400 K/uL   nRBC 0.0 0.0 - 0.2 %    Comment: Performed at [facility], [address], [place], [place] [address]  Basic metabolic panel with GFR     Status: Abnormal   Collection Time: 04/07/24  2:30 AM  Result Value Ref Range   Sodium 139 135 - 145 mmol/L   Potassium 4.0 3.5 - 5.1 mmol/L   Chloride 103 98 - 111 mmol/L   CO2 28 22 - 32 mmol/L   Glucose, Bld 107 (H) 70 - 99 mg/dL    Comment: Glucose reference range applies only to samples taken after fasting for at least 8 hours.   BUN 27 (H) 8 - 23 mg/dL   Creatinine, Ser 6.21 0.44 - 1.00 mg/dL   Calcium  8.9 8.9 - 10.3 mg/dL   GFR, Estimated 54 (L) >60 mL/min    Comment: (NOTE) Calculated using the CKD-EPI Creatinine Equation (2021)    Anion gap 8 5 - 15    Comment: Performed at [facility], [address], [place], [place] [address]   No results found.    Blood pressure (!) 135/95, pulse (!) 103, temperature 98.5 F (36.9 C),  temperature source Oral, resp. rate (!) 24, height 5\' 10"  (1.778 m), weight 97.5 kg, SpO2 98%.  Medical Problem List and Plan: 1. Functional deficits secondary to right supracondylar periprosthetic distal femur fracture.  Status post open reduction internal fixation 04/04/2024 per Dr. Guyann Leitz. TDWB right lower extremity  -patient may *** shower  -ELOS/Goals: *** 2.  Antithrombotics: -DVT/anticoagulation:  Pharmaceutical: Eliquis.  Check vascular study with results pending  -antiplatelet therapy: N/A 3. Pain Management: Lyrica  25 mg BID,, oxycodone  as needed 4. Mood/Behavior/Sleep: Zoloft  50 mg daily, trazodone  150 mg nightly.  Provide emotional support  -antipsychotic agents: N/A 5. Neuropsych/cognition:  This patient is capable of making decisions on her own behalf. 6. Skin/Wound Care: Routine skin checks 7. Fluids/Electrolytes/Nutrition: Routine in and outs with follow-up chemistries 8.  Acute blood loss anemia as well as history of normocytic anemia.  Continue iron supplement.  Follow-up CBC 9.  Transient atrial fibrillation with RVR Cardizem  CD 120 mg daily.  Follow-up cardiology services.  Continue Eliquis until further notice.. 10.  Obesity.  BMI 30.85.  Dietary follow-up 11.  Constipation.  MiraLAX  twice daily, Senokot daily, Colace 100 mg daily. 12.AKI.Resolved.Follow up chemestries   Jessica Hockey Angiulli, PA-C 04/08/2024

## 2024-04-08 NOTE — Discharge Instructions (Addendum)
 Orthopaedic Trauma Service Discharge Instructions   General Discharge Instructions  WEIGHT BEARING STATUS: Nonweightbearing R leg x 4-6 weeks  RANGE OF MOTION/ACTIVITY: unrestricted ROM R knee, standard total hip precautions R hip   Bone health:   Review the following resource for additional information regarding bone health  [email or web address].cy  Wound Care: daily wound care starting now   Discharge Wound Care Instructions  Do NOT apply any ointments, solutions or lotions to pin sites or surgical wounds.  These prevent needed drainage and even though solutions like hydrogen peroxide kill bacteria, they also damage cells lining the pin sites that help fight infection.  Applying lotions or ointments can keep the wounds moist and can cause them to breakdown and open up as well. This can increase the risk for infection. When in doubt call the office.  Surgical incisions should be dressed daily.  If any drainage is noted, use one layer of adaptic or Mepitel, then gauze, Kerlix, and an ace wrap.  NetCamper.cz [email or web address]-[email or web address]/?pd_rd_[email or web address]&th=1  [email or web address].[email or web address]  These dressing supplies should be available at local medical supply stores ([facility] [employer], [facility], etc). They are not usually carried at places like [facility], [facility], [facility], etc  Once the incision is completely dry and without drainage, it may be left open to air out.  Showering may begin 36-48 hours later.  Cleaning gently with soap and water.  Traumatic wounds should be dressed daily as well.    One layer of adaptic, gauze, Kerlix, then ace wrap.  The adaptic can be discontinued once the draining has ceased    If you have a wet to dry dressing: wet the gauze with saline the squeeze  as much saline out so the gauze is moist (not soaking wet), place moistened gauze over wound, then place a dry gauze over the moist one, followed by Kerlix wrap, then ace wrap.  DVT/PE prophylaxis: aspirin  81 mg daily (home regimen)  Diet: as you were eating previously.  Can use over the counter stool softeners and bowel preparations, such as Miralax , to help with bowel movements.  Narcotics can be constipating.  Be sure to drink plenty of fluids  PAIN MEDICATION USE AND EXPECTATIONS  You have likely been given narcotic medications to help control your pain.  After a traumatic event that results in an fracture (broken bone) with or without surgery, it is ok to use narcotic pain medications to help control one's pain.  We understand that everyone responds to pain differently and each individual patient will be evaluated on a regular basis for the continued need for narcotic medications. Ideally, narcotic medication use should last no more than 6-8 weeks (coinciding with fracture healing).   As a patient it is your responsibility as well to monitor narcotic medication use and report the amount and frequency you use these medications when you come to your office visit.   We would also advise that if you are using narcotic medications, you should take a dose prior to therapy to maximize you participation.  IF YOU ARE ON NARCOTIC MEDICATIONS IT IS NOT PERMISSIBLE TO OPERATE A MOTOR VEHICLE (MOTORCYCLE/CAR/TRUCK/MOPED) OR HEAVY MACHINERY DO NOT MIX NARCOTICS WITH OTHER CNS (CENTRAL NERVOUS SYSTEM) DEPRESSANTS SUCH AS ALCOHOL   POST-OPERATIVE OPIOID TAPER INSTRUCTIONS: It is important to wean off of your opioid medication as soon as possible. If you do not need pain medication after your surgery it is ok to stop day one. Opioids include: Codeine, Hydrocodone(Norco, Vicodin), Oxycodone (Percocet, oxycontin ) and hydromorphone amongst others.  Long term  and even short term use of opiods can cause: Increased  pain response Dependence Constipation Depression Respiratory depression And more.  Withdrawal symptoms can include Flu like symptoms Nausea, vomiting And more Techniques to manage these symptoms Hydrate well Eat regular healthy meals Stay active Use relaxation techniques(deep breathing, meditating, yoga) Do Not substitute Alcohol to help with tapering If you have been on opioids for less than two weeks and do not have pain than it is ok to stop all together.  Plan to wean off of opioids This plan should start within one week post op of your fracture surgery  Maintain the same interval or time between taking each dose and first decrease the dose.  Cut the total daily intake of opioids by one tablet each day Next start to increase the time between doses. The last dose that should be eliminated is the evening dose.    STOP SMOKING OR USING NICOTINE PRODUCTS!!!!  As discussed nicotine severely impairs your body's ability to heal surgical and traumatic wounds but also impairs bone healing.  Wounds and bone heal by forming microscopic blood vessels (angiogenesis) and nicotine is a vasoconstrictor (essentially, shrinks blood vessels).  Therefore, if vasoconstriction occurs to these microscopic blood vessels they essentially disappear and are unable to deliver necessary nutrients to the healing tissue.  This is one modifiable factor that you can do to dramatically increase your chances of healing your injury.    (This means no smoking, no nicotine gum, patches, etc)  DO NOT USE NONSTEROIDAL ANTI-INFLAMMATORY DRUGS (NSAID'S)  Using products such as Advil (ibuprofen), Aleve (naproxen), Motrin (ibuprofen) for additional pain control during fracture healing can delay and/or prevent the healing response.  If you would like to take over the counter (OTC) medication, Tylenol  (acetaminophen ) is ok.  However, some narcotic medications that are given for pain control contain acetaminophen  as well.  Therefore, you should not exceed more than 4000 mg of tylenol  in a day if you do not have liver disease.  Also note that there are may OTC medicines, such as cold medicines and allergy medicines that my contain tylenol  as well.  If you have any questions about medications and/or interactions please ask your doctor/PA or your pharmacist.      ICE AND ELEVATE INJURED/OPERATIVE EXTREMITY  Using ice and elevating the injured extremity above your heart can help with swelling and pain control.  Icing in a pulsatile fashion, such as 20 minutes on and 20 minutes off, can be followed.    Do not place ice directly on skin. Make sure there is a barrier between to skin and the ice pack.    Using frozen items such as frozen peas works well as the conform nicely to the are that needs to be iced.  USE AN ACE WRAP OR TED HOSE FOR SWELLING CONTROL  In addition to icing and elevation, Ace wraps or TED hose are used to help limit and resolve swelling.  It is recommended to use Ace wraps or TED hose until you are informed to stop.    When using Ace Wraps start the wrapping distally (farthest away from the body) and wrap proximally (closer to the body)   Example: If you had surgery on your leg and you do not have a splint on, start the ace wrap at the toes and work your way up to the thigh        If you had surgery on your upper extremity and do not have a splint on, start  the ace wrap at your fingers and work your way up to the upper arm  IF YOU ARE IN A SPLINT OR CAST DO NOT REMOVE IT FOR ANY REASON   If your splint gets wet for any reason please contact the office immediately. You may shower in your splint or cast as long as you keep it dry.  This can be done by wrapping in a cast cover or garbage back (or similar)  Do Not stick any thing down your splint or cast such as pencils, money, or hangers to try and scratch yourself with.  If you feel itchy take benadryl  as prescribed on the bottle for itching  IF YOU ARE  IN A CAM BOOT (BLACK BOOT)  You may remove boot periodically. Perform daily dressing changes as noted below.  Wash the liner of the boot regularly and wear a sock when wearing the boot. It is recommended that you sleep in the boot until told otherwise    Call office for the following: Temperature greater than 101F Persistent nausea and vomiting Severe uncontrolled pain Redness, tenderness, or signs of infection (pain, swelling, redness, odor or green/yellow discharge around the site) Difficulty breathing, headache or visual disturbances Hives Persistent dizziness or light-headedness Extreme fatigue Any other questions or concerns you may have after discharge  In an emergency, call 911 or go to an Emergency Department at a nearby hospital  HELPFUL INFORMATION  If you had a block, it will wear off between 8-24 hrs postop typically.  This is period when your pain may go from nearly zero to the pain you would have had postop without the block.  This is an abrupt transition but nothing dangerous is happening.  You may take an extra dose of narcotic when this happens.  You should wean off your narcotic medicines as soon as you are able.  Most patients will be off or using minimal narcotics before their first postop appointment.   We suggest you use the pain medication the first night prior to going to bed, in order to ease any pain when the anesthesia wears off. You should avoid taking pain medications on an empty stomach as it will make you nauseous.  Do not drink alcoholic beverages or take illicit drugs when taking pain medications.  In most states it is against the law to drive while you are in a splint or sling.  And certainly against the law to drive while taking narcotics.  You may return to work/school in the next couple of days when you feel up to it.   Pain medication may make you constipated.  Below are a few solutions to try in this order: Decrease the amount of pain medication  if you aren't having pain. Drink lots of decaffeinated fluids. Drink prune juice and/or each dried prunes  If the first 3 don't work start with additional solutions Take Colace - an over-the-counter stool softener Take Senokot - an over-the-counter laxative Take Miralax  - a stronger over-the-counter laxative     CALL THE OFFICE WITH ANY QUESTIONS OR CONCERNS: [phone]   VISIT OUR WEBSITE FOR ADDITIONAL INFORMATION: [email or web address]    Information on my medicine - ELIQUIS (apixaban)  This medication education was reviewed with me or my healthcare representative as part of my discharge preparation.  The pharmacist that spoke with me during my hospital stay was:  Ladona Pickles, [facility]  Why was Eliquis prescribed for you? Eliquis was prescribed for you to reduce the risk of a blood clot  forming that can cause a stroke if you have a medical condition called atrial fibrillation (a type of irregular heartbeat).  What do You need to know about Eliquis ? Take your Eliquis TWICE DAILY - one tablet in the morning and one tablet in the evening with or without food. If you have difficulty swallowing the tablet whole please discuss with your pharmacist how to take the medication safely.  Take Eliquis exactly as prescribed by your doctor and DO NOT stop taking Eliquis without talking to the doctor who prescribed the medication.  Stopping may increase your risk of developing a stroke.  Refill your prescription before you run out.  After discharge, you should have regular check-up appointments with your healthcare provider that is prescribing your Eliquis.  In the future your dose may need to be changed if your kidney function or weight changes by a significant amount or as you get older.  What do you do if you miss a dose? If you miss a dose, take it as soon as you remember on the same day and resume taking twice daily.  Do not take more than one dose of ELIQUIS at the same time to  make up a missed dose.  Important Safety Information A possible side effect of Eliquis is bleeding. You should call your healthcare provider right away if you experience any of the following: Bleeding from an injury or your nose that does not stop. Unusual colored urine (red or dark brown) or unusual colored stools (red or black). Unusual bruising for unknown reasons. A serious fall or if you hit your head (even if there is no bleeding).  Some medicines may interact with Eliquis and might increase your risk of bleeding or clotting while on Eliquis. To help avoid this, consult your healthcare provider or pharmacist prior to using any new prescription or non-prescription medications, including herbals, vitamins, non-steroidal anti-inflammatory drugs (NSAIDs) and supplements.  This website has more information on Eliquis (apixaban): http://www.eliquis.com/eliquis/home

## 2024-04-08 NOTE — Progress Notes (Signed)
 Lower extremity venous duplex completed. Please see CV Procedures for preliminary results.  Estanislao Heimlich, RVT 04/08/24 2:57 PM

## 2024-04-08 NOTE — TOC Progression Note (Signed)
 Transition of Care [facility]) - Progression Note    Patient Details  Name: [name] MRN: [id number] Date of Birth: 12/24/1929  Transition of Care [facility] CM/SW Contact  Graves-Bigelow, Jari Merles, RN Phone Number: 04/08/2024, 11:04 AM  Clinical Narrative:   Case Manager spoke with patient and she states she is ready for some rehab. Inpatient Rehab Coordinator is following for CIR. Plan will be to transition to CIR once some other test are completed. No further needs identified at this time.    Expected Discharge Plan: IP Rehab Facility Barriers to Discharge: No Barriers Identified  Expected Discharge Plan and Services In-house Referral: NA Discharge Planning Services: CM Consult Post Acute Care Choice: Skilled Nursing Facility Living arrangements for the past 2 months: Single Family Home                   DME Agency: NA   Social Determinants of Health (SDOH) Interventions SDOH Screenings   Food Insecurity: No Food Insecurity (04/03/2024)  Housing: Low Risk  (04/03/2024)  Transportation Needs: No Transportation Needs (04/03/2024)  Utilities: Not At Risk (04/03/2024)  Social Connections: Moderately Isolated (04/03/2024)  Tobacco Use: Low Risk  (04/04/2024)    Readmission Risk Interventions    08/17/2022   10:36 AM  Readmission Risk Prevention Plan  Transportation Screening Complete  PCP or Specialist Appt within 5-7 Days Complete  Home Care Screening Complete  Medication Review (RN CM) Complete

## 2024-04-09 ENCOUNTER — Other Ambulatory Visit: Payer: Self-pay

## 2024-04-09 ENCOUNTER — Inpatient Hospital Stay (HOSPITAL_COMMUNITY)
Admission: AD | Admit: 2024-04-09 | Discharge: 2024-04-25 | DRG: 560 | Disposition: A | Discharge status: Skilled Nursing Facility | Source: Intra-hospital | Attending: Physical Medicine & Rehabilitation | Admitting: Physical Medicine & Rehabilitation

## 2024-04-09 ENCOUNTER — Encounter (HOSPITAL_COMMUNITY): Payer: Self-pay | Admitting: Physical Medicine & Rehabilitation

## 2024-04-09 DIAGNOSIS — E669 Obesity, unspecified: Secondary | ICD-10-CM | POA: Diagnosis present

## 2024-04-09 DIAGNOSIS — E876 Hypokalemia: Secondary | ICD-10-CM | POA: Diagnosis present

## 2024-04-09 DIAGNOSIS — S7291XD Unspecified fracture of right femur, subsequent encounter for closed fracture with routine healing: Secondary | ICD-10-CM | POA: Diagnosis not present

## 2024-04-09 DIAGNOSIS — M797 Fibromyalgia: Secondary | ICD-10-CM | POA: Diagnosis present

## 2024-04-09 DIAGNOSIS — N179 Acute kidney failure, unspecified: Secondary | ICD-10-CM | POA: Diagnosis present

## 2024-04-09 DIAGNOSIS — E785 Hyperlipidemia, unspecified: Secondary | ICD-10-CM | POA: Diagnosis present

## 2024-04-09 DIAGNOSIS — S72451D Displaced supracondylar fracture without intracondylar extension of lower end of right femur, subsequent encounter for closed fracture with routine healing: Secondary | ICD-10-CM | POA: Diagnosis not present

## 2024-04-09 DIAGNOSIS — M978XXD Periprosthetic fracture around other internal prosthetic joint, subsequent encounter: Secondary | ICD-10-CM | POA: Diagnosis not present

## 2024-04-09 DIAGNOSIS — W06XXXD Fall from bed, subsequent encounter: Secondary | ICD-10-CM | POA: Diagnosis present

## 2024-04-09 DIAGNOSIS — K5901 Slow transit constipation: Secondary | ICD-10-CM | POA: Diagnosis not present

## 2024-04-09 DIAGNOSIS — K5900 Constipation, unspecified: Secondary | ICD-10-CM | POA: Diagnosis present

## 2024-04-09 DIAGNOSIS — G8929 Other chronic pain: Secondary | ICD-10-CM | POA: Diagnosis present

## 2024-04-09 DIAGNOSIS — Z79899 Other long term (current) drug therapy: Secondary | ICD-10-CM

## 2024-04-09 DIAGNOSIS — Z96643 Presence of artificial hip joint, bilateral: Secondary | ICD-10-CM | POA: Diagnosis present

## 2024-04-09 DIAGNOSIS — I4891 Unspecified atrial fibrillation: Secondary | ICD-10-CM | POA: Diagnosis present

## 2024-04-09 DIAGNOSIS — Z96649 Presence of unspecified artificial hip joint: Secondary | ICD-10-CM

## 2024-04-09 DIAGNOSIS — R350 Frequency of micturition: Secondary | ICD-10-CM | POA: Diagnosis present

## 2024-04-09 DIAGNOSIS — M898X9 Other specified disorders of bone, unspecified site: Secondary | ICD-10-CM | POA: Diagnosis present

## 2024-04-09 DIAGNOSIS — M79604 Pain in right leg: Secondary | ICD-10-CM | POA: Diagnosis not present

## 2024-04-09 DIAGNOSIS — Z7901 Long term (current) use of anticoagulants: Secondary | ICD-10-CM | POA: Diagnosis not present

## 2024-04-09 DIAGNOSIS — D62 Acute posthemorrhagic anemia: Secondary | ICD-10-CM | POA: Diagnosis present

## 2024-04-09 DIAGNOSIS — K219 Gastro-esophageal reflux disease without esophagitis: Secondary | ICD-10-CM | POA: Diagnosis present

## 2024-04-09 DIAGNOSIS — Z85828 Personal history of other malignant neoplasm of skin: Secondary | ICD-10-CM

## 2024-04-09 DIAGNOSIS — S7290XA Unspecified fracture of unspecified femur, initial encounter for closed fracture: Principal | ICD-10-CM | POA: Diagnosis present

## 2024-04-09 DIAGNOSIS — I10 Essential (primary) hypertension: Secondary | ICD-10-CM | POA: Diagnosis present

## 2024-04-09 DIAGNOSIS — Z6837 Body mass index (BMI) 37.0-37.9, adult: Secondary | ICD-10-CM

## 2024-04-09 DIAGNOSIS — G629 Polyneuropathy, unspecified: Secondary | ICD-10-CM | POA: Diagnosis present

## 2024-04-09 DIAGNOSIS — M545 Low back pain, unspecified: Secondary | ICD-10-CM | POA: Diagnosis not present

## 2024-04-09 DIAGNOSIS — M9701XD Periprosthetic fracture around internal prosthetic right hip joint, subsequent encounter: Secondary | ICD-10-CM

## 2024-04-09 DIAGNOSIS — K5909 Other constipation: Secondary | ICD-10-CM | POA: Diagnosis present

## 2024-04-09 DIAGNOSIS — N393 Stress incontinence (female) (male): Secondary | ICD-10-CM | POA: Diagnosis present

## 2024-04-09 DIAGNOSIS — M25551 Pain in right hip: Secondary | ICD-10-CM | POA: Diagnosis present

## 2024-04-09 MED ORDER — ACETAMINOPHEN 325 MG PO TABS
325.0000 mg | ORAL_TABLET | ORAL | Status: DC | PRN
  Administered 2024-04-09 – 2024-04-25 (×14): 650 mg via ORAL
  Filled 2024-04-09 (×14): qty 2

## 2024-04-09 MED ORDER — BISACODYL 10 MG RE SUPP: 10.0000 mg | Freq: Every day | RECTAL | Status: DC | PRN

## 2024-04-09 MED ORDER — ONDANSETRON HCL 4 MG/2ML IJ SOLN: 4.0000 mg | Freq: Four times a day (QID) | INTRAMUSCULAR | Status: DC | PRN

## 2024-04-09 MED ORDER — DORZOLAMIDE HCL-TIMOLOL MAL 2-0.5 % OP SOLN
1.0000 [drp] | Freq: Two times a day (BID) | OPHTHALMIC | Status: DC
  Administered 2024-04-09 – 2024-04-25 (×29): 1 [drp] via OPHTHALMIC
  Filled 2024-04-09: qty 10

## 2024-04-09 MED ORDER — METOPROLOL TARTRATE 12.5 MG HALF TABLET
25.0000 mg | ORAL_TABLET | Freq: Two times a day (BID) | ORAL | Status: DC
  Administered 2024-04-09 – 2024-04-25 (×32): 25 mg via ORAL
  Filled 2024-04-09 (×33): qty 2

## 2024-04-09 MED ORDER — TRAZODONE HCL 50 MG PO TABS
150.0000 mg | ORAL_TABLET | Freq: Every day | ORAL | Status: DC
  Administered 2024-04-09 – 2024-04-24 (×16): 150 mg via ORAL
  Filled 2024-04-09 (×16): qty 3

## 2024-04-09 MED ORDER — ADULT MULTIVITAMIN W/MINERALS CH
1.0000 | ORAL_TABLET | Freq: Every day | ORAL | Status: DC
  Administered 2024-04-10 – 2024-04-25 (×16): 1 via ORAL
  Filled 2024-04-09 (×17): qty 1

## 2024-04-09 MED ORDER — APIXABAN 5 MG PO TABS
5.0000 mg | ORAL_TABLET | Freq: Two times a day (BID) | ORAL | Status: DC
  Administered 2024-04-09 – 2024-04-25 (×32): 5 mg via ORAL
  Filled 2024-04-09 (×33): qty 1

## 2024-04-09 MED ORDER — ENSURE ENLIVE PO LIQD
237.0000 mL | Freq: Two times a day (BID) | ORAL | Status: DC
  Administered 2024-04-10 – 2024-04-25 (×19): 237 mL via ORAL

## 2024-04-09 MED ORDER — PREGABALIN 25 MG PO CAPS
25.0000 mg | ORAL_CAPSULE | Freq: Two times a day (BID) | ORAL | Status: DC
  Administered 2024-04-09 – 2024-04-25 (×32): 25 mg via ORAL
  Filled 2024-04-09 (×33): qty 1

## 2024-04-09 MED ORDER — PANTOPRAZOLE SODIUM 40 MG PO TBEC
40.0000 mg | DELAYED_RELEASE_TABLET | Freq: Every day | ORAL | Status: DC
  Administered 2024-04-10 – 2024-04-25 (×16): 40 mg via ORAL
  Filled 2024-04-09 (×17): qty 1

## 2024-04-09 MED ORDER — DOCUSATE SODIUM 100 MG PO CAPS
100.0000 mg | ORAL_CAPSULE | Freq: Every day | ORAL | Status: DC
  Administered 2024-04-09 – 2024-04-25 (×17): 100 mg via ORAL
  Filled 2024-04-09 (×18): qty 1

## 2024-04-09 MED ORDER — ONDANSETRON HCL 4 MG PO TABS: 4.0000 mg | ORAL_TABLET | Freq: Four times a day (QID) | ORAL | Status: DC | PRN

## 2024-04-09 MED ORDER — LATANOPROST 0.005 % OP SOLN
1.0000 [drp] | Freq: Every day | OPHTHALMIC | Status: DC
  Administered 2024-04-09 – 2024-04-24 (×16): 1 [drp] via OPHTHALMIC
  Filled 2024-04-09: qty 2.5

## 2024-04-09 MED ORDER — SERTRALINE HCL 50 MG PO TABS
50.0000 mg | ORAL_TABLET | Freq: Every day | ORAL | Status: DC
  Administered 2024-04-10 – 2024-04-25 (×16): 50 mg via ORAL
  Filled 2024-04-09 (×17): qty 1

## 2024-04-09 MED ORDER — FOLIC ACID 1 MG PO TABS
1.0000 mg | ORAL_TABLET | Freq: Every day | ORAL | Status: DC
  Administered 2024-04-10 – 2024-04-25 (×16): 1 mg via ORAL
  Filled 2024-04-09 (×17): qty 1

## 2024-04-09 MED ORDER — POLYETHYLENE GLYCOL 3350 17 G PO PACK
17.0000 g | PACK | Freq: Two times a day (BID) | ORAL | Status: DC
  Administered 2024-04-09 – 2024-04-11 (×3): 17 g via ORAL
  Filled 2024-04-09 (×7): qty 1

## 2024-04-09 MED ORDER — BISACODYL 5 MG PO TBEC
10.0000 mg | DELAYED_RELEASE_TABLET | Freq: Every day | ORAL | Status: DC
  Administered 2024-04-09 – 2024-04-24 (×16): 10 mg via ORAL
  Filled 2024-04-09 (×16): qty 2

## 2024-04-09 MED ORDER — OXYCODONE HCL 5 MG PO TABS
5.0000 mg | ORAL_TABLET | Freq: Four times a day (QID) | ORAL | Status: DC | PRN
  Administered 2024-04-10 – 2024-04-24 (×11): 5 mg via ORAL
  Filled 2024-04-09 (×12): qty 1

## 2024-04-09 MED ORDER — FERROUS SULFATE 325 (65 FE) MG PO TABS
325.0000 mg | ORAL_TABLET | Freq: Every day | ORAL | Status: DC
  Administered 2024-04-10 – 2024-04-25 (×16): 325 mg via ORAL
  Filled 2024-04-09 (×17): qty 1

## 2024-04-09 NOTE — Evaluation (Signed)
 Occupational Therapy Assessment and Plan  Patient Details  Name: [name] MRN: [id number] Date of Birth: 22-Oct-1930  OT Diagnosis: {diagnoses:3041644} Rehab Potential:   ELOS:     {CHL IP REHAB OT TIME CALCULATIONS:304400400}    Hospital Problem: Principal Problem:   Femur fracture (HCC)   Past Medical History:  Past Medical History:  Diagnosis Date   Anemia    Arthritis    Complication of anesthesia    "I GOT PNEUMONIA THE NEXT DAY FROM THE ANESTHESIA"   Depression    Fibromyalgia    GERD (gastroesophageal reflux disease)    History of skin cancer    HLD (hyperlipidemia)    HTN (hypertension)    Internal hemorrhoids    Osteoarthritis    Peripheral neuropathy    Shortness of breath    Skin cancer    SUI (stress urinary incontinence, female)    Ulcerative colitis    Past Surgical History:  Past Surgical History:  Procedure Laterality Date   ABDOMINAL HYSTERECTOMY     APPENDECTOMY     BREAST EXCISIONAL BIOPSY Left 08/10/2011    Dr Linell Rhymes   BREAST SURGERY     CARPAL TUNNEL RELEASE Left    CESAREAN SECTION     CHOLECYSTECTOMY     COLONOSCOPY  04/29/2003   internal hemorrhoids   JOINT REPLACEMENT Bilateral 2010 and 2012   KNEES   ORIF FEMUR FRACTURE Right 04/04/2024   Procedure: OPEN REDUCTION INTERNAL FIXATION RIGHT FEMUR FRACTURE;  Surgeon: Hardy Lia, MD;  Location: [facility] OR;  Service: Orthopedics;  Laterality: Right;   TOTAL HIP ARTHROPLASTY Right 08/06/2014   Procedure: RIGHT TOTAL HIP ARTHROPLASTY ANTERIOR APPROACH;  Surgeon: Aurther Blue, MD;  Location: [facility] ORS;  Service: Orthopedics;  Laterality: Right;   TOTAL HIP ARTHROPLASTY Left 12/17/2014   Procedure: LEFT TOTAL HIP ARTHROPLASTY ANTERIOR APPROACH;  Surgeon: Aurther Blue, MD;  Location: [facility] ORS;  Service: Orthopedics;  Laterality: Left;    Assessment & Plan Clinical Impression: Patient is a 89 year old right-handed female with history significant for normocytic anemia, osteoarthritis with  bilateral knee and bilateral hip replacement, obesity with BMI 30.85, depression, fibromyalgia, GERD/ulcerative colitis, skin cancer, hyperlipidemia, hypertension, internal hemorrhoids, peripheral neuropathy, stress urinary incontinence. Used a rolling walker inside the home for mobility and electric scooter in the community. Presented 04/03/2024 after mechanical fall when getting out of bed to answer the phone landing on her right hip. X-rays and imaging revealed right supracondylar periprosthetic distal femur fracture below total hip arthroplasty. Patient went into atrial fibrillation with RVR while in the ED. Admission chemistries unremarkable except WBC 11,100, glucose 109, troponin negative. The patient received diltiazem  15 mg IVP followed by continuous diltiazem  infusion for atrial fibrillation. Echocardiogram with ejection fraction of 60 to 65% no wall motion abnormalities. Patient was transition to p.o Lopressor  and case discussed with cardiology services plan to follow-up outpatient.. Patient was stabilized and underwent right open reduction internal fixation of right supracondylar femur fracture using Smith nephew plate 4/0/9811 per Dr. Guyann Leitz 04/04/2024. Patient was cleared to begin Eliquis for both DVT prophylaxis and transient atrial fibrillation that they would remain on until follow-up with cardiology services. Hospital course acute blood loss anemia 8.6 and monitored. Mild AKI 2.08 improved with fluids and lattest CRT 0.84.   Patient transferred to CIR on 04/09/2024 .    Patient currently requires {BJY:7829562} with {ZHY:8657846} secondary to {NGEXBMWUXLK:4401027}.  Prior to hospitalization, patient could complete *** with {OZD:6644034}.  Patient will benefit from skilled intervention to {benefit  of skilled intervention:3041641} prior to discharge {discharge:3041642}.  Anticipate patient will require {supervision/assistance:22779} and {follow WU:9811914}.      OT  Evaluation Precautions/Restrictions  Restrictions Weight Bearing Restrictions Per Provider Order: Yes RLE Weight Bearing Per Provider Order: Touchdown weight bearing Pain Pain Assessment Pain Scale: 0-10 Pain Score: 0-No pain Home Living/Prior Functioning Home Living Family/patient expects to be discharged to:: Private residence Living Arrangements: Children Vision   Perception    Praxis   Cognition   Sensation   Motor     Trunk/Postural Assessment     Balance   Extremity/Trunk Assessment      Care Tool Care Tool Self Care Eating        Oral Care         Bathing              Upper Body Dressing(including orthotics)            Lower Body Dressing (excluding footwear)          Putting on/Taking off footwear             Care Tool Toileting Toileting activity         Care Tool Bed Mobility Roll left and right activity        Sit to lying activity        Lying to sitting on side of bed activity         Care Tool Transfers Sit to stand transfer        Chair/bed transfer         Toilet transfer         Care Tool Cognition  Expression of Ideas and Wants    Understanding Verbal and Non-Verbal Content     Memory/Recall Ability     Refer to Care Plan for Long Term Goals  SHORT TERM GOAL WEEK 1    Recommendations for other services: {RECOMMENDATIONS FOR OTHER SERVICES:3049016}   Skilled Therapeutic Intervention ADL   Mobility    Skilled Intervention  Discharge Criteria: Patient will be discharged from OT if patient refuses treatment 3 consecutive times without medical reason, if treatment goals not met, if there is a change in medical status, if patient makes no progress towards goals or if patient is discharged from hospital.  The above assessment, treatment plan, treatment alternatives and goals were discussed and mutually agreed upon: {Assessment/Treatment Plan Discussed/Agreed:3049017}  Carollee Circle,  OTR/L,CBIS  Supplemental OT - [facility] Secure Chat Preferred   04/09/2024, 4:48 PM

## 2024-04-09 NOTE — Discharge Summary (Signed)
 Physician Discharge Summary  Patient ID: [name] MRN: [id number] DOB/AGE: [birth year] 89 y.o.  Admit date: 04/09/2024 Discharge date: 04/25/2024  Discharge Diagnoses:  Principal Problem:   Femur fracture (HCC) DVT prophylaxis Acute blood loss anemia as well as history of normocytic anemia Transient atrial fibrillation with RVR Obesity Constipation AKI-resolved Osteoarthritis GERD/ulcerative colitis Mood stabilization Fibromyalgia Hypertension Hyperlipidemia Stress urinary incontinence  Discharged Condition: Stable  Significant Diagnostic Studies: DG FEMUR, MIN 2 VIEWS RIGHT Result Date: 04/17/2024 CLINICAL DATA:  Fracture, postop. EXAM: RIGHT FEMUR 2 VIEWS COMPARISON:  Radiograph 04/04/2024 FINDINGS: Lateral plate and screw fixation of distal femur fracture. Unchanged fracture alignment. No periprosthetic lucency. No significant callus formation or interval healing. Previous hip and knee alignment. Persistent soft tissue edema. IMPRESSION: ORIF of distal femur fracture without hardware complication. Unchanged fracture alignment. No interval callus formation. Electronically Signed   By: Chadwick Colonel M.D.   On: 04/17/2024 11:40   VAS US  LOWER EXTREMITY VENOUS (DVT) Result Date: 04/09/2024  Lower Venous DVT Study Patient Name:  [name]  Date of Exam:   04/08/2024 Medical Rec #: [id number]         Accession #:    [id number] Date of Birth: 1929-12-22         Patient Gender: F Patient Age:   89 years Exam Location:  [facility] Procedure:      VAS US  LOWER EXTREMITY VENOUS (DVT) Referring Phys: PRANAV PATEL --------------------------------------------------------------------------------  Indications: Edema.  Risk Factors: Cancer skin Surgery Open reduction RT femur fracture 04/04/24 past pregnancy and obesity. Comparison Study: None. Performing Technologist: Estanislao Heimlich  Examination Guidelines: A complete evaluation includes B-mode imaging, spectral Doppler, color  Doppler, and power Doppler as needed of all accessible portions of each vessel. Bilateral testing is considered an integral part of a complete examination. Limited examinations for reoccurring indications may be performed as noted. The reflux portion of the exam is performed with the patient in reverse Trendelenburg.  +---------+---------------+---------+-----------+----------+--------------+ RIGHT    CompressibilityPhasicitySpontaneityPropertiesThrombus Aging +---------+---------------+---------+-----------+----------+--------------+ CFV      Full           Yes      Yes                                 +---------+---------------+---------+-----------+----------+--------------+ SFJ      Full                                                        +---------+---------------+---------+-----------+----------+--------------+ FV Prox  Full                                                        +---------+---------------+---------+-----------+----------+--------------+ FV Mid   Full                                                        +---------+---------------+---------+-----------+----------+--------------+ FV DistalFull                                                        +---------+---------------+---------+-----------+----------+--------------+  PFV      Full                                                        +---------+---------------+---------+-----------+----------+--------------+ POP      Full           Yes      Yes                                 +---------+---------------+---------+-----------+----------+--------------+ PTV      Full                    Yes                                 +---------+---------------+---------+-----------+----------+--------------+ PERO     Full                    Yes                                 +---------+---------------+---------+-----------+----------+--------------+    +----+---------------+---------+-----------+----------+--------------+ LEFTCompressibilityPhasicitySpontaneityPropertiesThrombus Aging +----+---------------+---------+-----------+----------+--------------+ CFV Full           Yes      Yes                                 +----+---------------+---------+-----------+----------+--------------+     Summary: RIGHT: - There is no evidence of deep vein thrombosis in the lower extremity.  - No cystic structure found in the popliteal fossa.  LEFT: - No evidence of common femoral vein obstruction.   *See table(s) above for measurements and observations. Electronically signed by Delaney Fearing on 04/09/2024 at 2:56:44 PM.    Final    DG Abd Portable 1V Result Date: 04/08/2024 CLINICAL DATA:  Shortness of breath, constipation EXAM: PORTABLE ABDOMEN - 1 VIEW COMPARISON:  CT abdomen 08/15/2022 : The amount of gas and formed stool in the colon is within normal limits. No dilated bowel observed. Bilateral total hip prostheses noted. Lower lumbar spondylosis and degenerative disc disease. The lung bases appear clear. IMPRESSION: 1. No acute findings. 2. Lower lumbar spondylosis and degenerative disc disease. Electronically Signed   By: Freida Jes M.D.   On: 04/08/2024 13:51   DG CHEST PORT 1 VIEW Result Date: 04/08/2024 CLINICAL DATA:  Shortness of breath EXAM: PORTABLE CHEST 1 VIEW COMPARISON:  Chest radiograph dated 04/03/2024 FINDINGS: Patient is rotated slightly to the right. Normal lung volumes. Bibasilar patchy opacities. No pleural effusion or pneumothorax. The heart size and mediastinal contours are within normal limits. No acute osseous abnormality. IMPRESSION: Bibasilar patchy opacities, likely atelectasis. Aspiration or pneumonia can be considered in the appropriate clinical setting. Electronically Signed   By: Limin  Xu M.D.   On: 04/08/2024 13:50   DG FEMUR PORT, MIN 2 VIEWS RIGHT Result Date: 04/04/2024 CLINICAL DATA:  Fracture, post ORIF. EXAM:  RIGHT FEMUR PORTABLE 2 VIEW COMPARISON:  Preoperative imaging FINDINGS: Lateral plate and screw fixation of femoral shaft fracture. Improved fracture alignment from preoperative imaging. Previous knee and hip arthroplasty. Soft tissue air and edema at the operative  site. IMPRESSION: ORIF of femoral shaft fracture. Electronically Signed   By: Chadwick Colonel M.D.   On: 04/04/2024 13:06   DG FEMUR, MIN 2 VIEWS RIGHT Result Date: 04/04/2024 CLINICAL DATA:  Elective surgery. EXAM: RIGHT FEMUR 2 VIEWS COMPARISON:  Preoperative imaging FINDINGS: Six fluoroscopic spot views of the femur submitted from the operating room. Plate and screw fixation of femoral shaft fracture. Previous knee and hip arthroplasty. Fluoroscopy time 1 minute 24 seconds. Dose 11.99 mGy. IMPRESSION: Intraoperative fluoroscopy during femoral shaft fracture fixation. Electronically Signed   By: Chadwick Colonel M.D.   On: 04/04/2024 11:57   DG C-Arm 1-60 Min-No Report Result Date: 04/04/2024 Fluoroscopy was utilized by the requesting physician.  No radiographic interpretation.   DG C-Arm 1-60 Min-No Report Result Date: 04/04/2024 Fluoroscopy was utilized by the requesting physician.  No radiographic interpretation.   ECHOCARDIOGRAM COMPLETE Result Date: 04/03/2024    ECHOCARDIOGRAM REPORT   Patient Name:   MINAMI ARRIAGA Date of Exam: 04/03/2024 Medical Rec #:  [id number]        Height:       70.0 in Accession #:    [id number]       Weight:       215.0 lb Date of Birth:  [birth year]        BSA:          2.152 m Patient Age:    89 years         BP:           92/55 mmHg Patient Gender: F                HR:           114 bpm. Exam Location:  Inpatient Procedure: 2D Echo, Cardiac Doppler and Color Doppler (Both Spectral and Color            Flow Doppler were utilized during procedure). Indications:    Arrhythmia  History:        Patient has no prior history of Echocardiogram examinations.                 Signs/Symptoms:Shortness of Breath; Risk  Factors:Hypertension.  Sonographer:    Astrid Blamer Referring Phys: [id number] DAVID MANUEL ORTIZ IMPRESSIONS  1. Intracavitary gradient. Peak velocity 2.48 m/s. Peak gradient 24.7 mmHg. Left ventricular ejection fraction, by estimation, is 60 to 65%. The left ventricle has normal function. The left ventricle has no regional wall motion abnormalities. There is moderate asymmetric left ventricular hypertrophy of the septal segment. Left ventricular diastolic function could not be evaluated.  2. Right ventricular systolic function is normal. The right ventricular size is normal.  3. Left atrial size was moderately dilated.  4. The mitral valve is normal in structure. Trivial mitral valve regurgitation. No evidence of mitral stenosis.  5. The aortic valve is tricuspid. There is mild calcification of the aortic valve. There is mild thickening of the aortic valve. Aortic valve regurgitation is not visualized. No aortic stenosis is present. FINDINGS  Left Ventricle: Intracavitary gradient. Peak velocity 2.48 m/s. Peak gradient 24.7 mmHg. Left ventricular ejection fraction, by estimation, is 60 to 65%. The left ventricle has normal function. The left ventricle has no regional wall motion abnormalities. The left ventricular internal cavity size was normal in size. There is moderate asymmetric left ventricular hypertrophy of the septal segment. Left ventricular diastolic function could not be evaluated due to atrial fibrillation. Left ventricular diastolic function could not be evaluated. Indeterminate filling pressures. Right  Ventricle: The right ventricular size is normal. No increase in right ventricular wall thickness. Right ventricular systolic function is normal. Left Atrium: Left atrial size was moderately dilated. Right Atrium: Right atrial size was normal in size. Pericardium: There is no evidence of pericardial effusion. Mitral Valve: The mitral valve is normal in structure. Trivial mitral valve regurgitation. No  evidence of mitral valve stenosis. Tricuspid Valve: The tricuspid valve is normal in structure. Tricuspid valve regurgitation is trivial. No evidence of tricuspid stenosis. Aortic Valve: The aortic valve is tricuspid. There is mild calcification of the aortic valve. There is mild thickening of the aortic valve. Aortic valve regurgitation is not visualized. No aortic stenosis is present. Aortic valve peak gradient measures 8.8 mmHg. Pulmonic Valve: The pulmonic valve was normal in structure. Pulmonic valve regurgitation is not visualized. No evidence of pulmonic stenosis. Aorta: The aortic root is normal in size and structure. Venous: The inferior vena cava was not well visualized. IAS/Shunts: No atrial level shunt detected by color flow Doppler.  LEFT VENTRICLE PLAX 2D LVIDd:         3.80 cm   Diastology LVIDs:         2.20 cm   LV e' medial:    8.16 cm/s LV PW:         0.90 cm   LV E/e' medial:  12.1 LV IVS:        1.40 cm   LV e' lateral:   11.20 cm/s LVOT diam:     1.80 cm   LV E/e' lateral: 8.8 LV SV:         47 LV SV Index:   22 LVOT Area:     2.54 cm  RIGHT VENTRICLE RV S prime:     11.70 cm/s TAPSE (M-mode): 1.5 cm LEFT ATRIUM           Index        RIGHT ATRIUM           Index LA Vol (A4C): 85.1 ml 39.54 ml/m  RA Area:     10.20 cm                                    RA Volume:   19.90 ml  9.25 ml/m  AORTIC VALVE AV Area (Vmax): 2.34 cm AV Vmax:        148.00 cm/s AV Peak Grad:   8.8 mmHg LVOT Vmax:      136.00 cm/s LVOT Vmean:     99.100 cm/s LVOT VTI:       0.183 m  AORTA Ao Root diam: 3.30 cm Ao Asc diam:  3.20 cm MITRAL VALVE               TRICUSPID VALVE MV Area (PHT): 3.10 cm    TR Peak grad:   9.6 mmHg MV E velocity: 98.50 cm/s  TR Vmax:        155.00 cm/s                             SHUNTS                            Systemic VTI:  0.18 m  Systemic Diam: 1.80 cm Maudine Sos MD Electronically signed by Maudine Sos MD Signature Date/Time: 04/03/2024/7:25:16 PM     Final    DG Hip Unilat W or Wo Pelvis 2-3 Views Right Result Date: 04/03/2024 CLINICAL DATA:  Fall.  Shortening rotation of the right leg. EXAM: RIGHT FEMUR 2 VIEWS; DG HIP (WITH OR WITHOUT PELVIS) 2-3V RIGHT; RIGHT KNEE - COMPLETE 4+ VIEW COMPARISON:  Right hip radiographs 07/05/2022. FINDINGS: And oblique fracture of the distal femoral metaphysis is present just above the femoral component of the total knee arthroplasty. The fracture is displaced laterally and slightly posteriorly. Valgus angulation is noted. The right hip arthroplasty is intact. No fractures are present adjacent to the arthroplasty. The knee arthroplasty is also intact. IMPRESSION: 1. Oblique fracture of the distal femoral metaphysis just above the femoral component of the total knee arthroplasty. 2. The right hip and knee arthroplasty are intact. Electronically Signed   By: Audree Leas M.D.   On: 04/03/2024 11:18   DG Knee Complete 4 Views Right Result Date: 04/03/2024 CLINICAL DATA:  Fall.  Shortening rotation of the right leg. EXAM: RIGHT FEMUR 2 VIEWS; DG HIP (WITH OR WITHOUT PELVIS) 2-3V RIGHT; RIGHT KNEE - COMPLETE 4+ VIEW COMPARISON:  Right hip radiographs 07/05/2022. FINDINGS: And oblique fracture of the distal femoral metaphysis is present just above the femoral component of the total knee arthroplasty. The fracture is displaced laterally and slightly posteriorly. Valgus angulation is noted. The right hip arthroplasty is intact. No fractures are present adjacent to the arthroplasty. The knee arthroplasty is also intact. IMPRESSION: 1. Oblique fracture of the distal femoral metaphysis just above the femoral component of the total knee arthroplasty. 2. The right hip and knee arthroplasty are intact. Electronically Signed   By: Audree Leas M.D.   On: 04/03/2024 11:18   DG Femur Min 2 Views Right Result Date: 04/03/2024 CLINICAL DATA:  Fall.  Shortening rotation of the right leg. EXAM: RIGHT FEMUR 2 VIEWS; DG HIP  (WITH OR WITHOUT PELVIS) 2-3V RIGHT; RIGHT KNEE - COMPLETE 4+ VIEW COMPARISON:  Right hip radiographs 07/05/2022. FINDINGS: And oblique fracture of the distal femoral metaphysis is present just above the femoral component of the total knee arthroplasty. The fracture is displaced laterally and slightly posteriorly. Valgus angulation is noted. The right hip arthroplasty is intact. No fractures are present adjacent to the arthroplasty. The knee arthroplasty is also intact. IMPRESSION: 1. Oblique fracture of the distal femoral metaphysis just above the femoral component of the total knee arthroplasty. 2. The right hip and knee arthroplasty are intact. Electronically Signed   By: Audree Leas M.D.   On: 04/03/2024 11:18   DG Chest Portable 1 View Result Date: 04/03/2024 CLINICAL DATA:  Marvell Slider. EXAM: PORTABLE CHEST 1 VIEW COMPARISON:  08/15/2022 FINDINGS: Normal sized heart. Tortuous and partially calcified thoracic aorta. Clear lungs with normal vascularity. No fracture or pneumothorax seen. Mild-to-moderate right glenohumeral degenerative changes. IMPRESSION: No acute abnormality. Electronically Signed   By: Catherin Closs M.D.   On: 04/03/2024 11:17    Labs:  Basic Metabolic Panel: Recent Labs  Lab 04/18/24 0553 04/22/24 0649  NA 139 140  K 3.6 3.7  CL 108 110  CO2 23 22  GLUCOSE 107* 105*  BUN 11 12  CREATININE 0.73 0.78  CALCIUM  9.0 9.1    CBC: Recent Labs  Lab 04/18/24 0553 04/22/24 0649  WBC 7.5 6.0  HGB 8.3* 9.4*  HCT 26.1* 30.5*  MCV 91.6 93.6  PLT 374 367  CBG: No results for input(s): "GLUCAP" in the last 168 hours.  Family history.  Mother with colon cancer Brother with diabetes.  Denies any esophageal cancer or rectal cancer  Brief HPI:   MAAT KAFER is a 89 y.o. right-handed female with history significant for normocytic anemia, osteoarthritis bilateral knees of bilateral hip replacement, obesity with BMI 30.85, depression fibromyalgia GERD with ulcerative  colitis skin cancer hyperlipidemia hypertension internal hemorrhoids peripheral neuropathy stress urinary incontinence.  Per chart review patient lives with son.  Two-level home with ramp to main entrance.  Used a rolling walker inside the home for mobility and electric scooter in the community.  Presented 04/03/2024 after mechanical fall when getting out of bed to answer the phone landing on her right hip.  X-rays and imaging revealed right supracondylar periprosthetic distal femur fracture below total hip arthroplasty.  Patient went into atrial fibrillation with RVR while in the ED.  Admission chemistries unremarkable except WBC 11,100 glucose 109 troponin negative.  Patient received diltiazem  15 mg IVP followed by continuous diltiazem  infusion for atrial fibrillation.  Echocardiogram with ejection fraction of 60 to 65% no wall motion abnormalities.  Patient was transition to p.o. Lopressor  and case discussed with cardiology services plan to follow-up outpatient.  Patient was stabilized underwent right open reduction internal fixation of right supracondylar femur fracture 04/04/2024 per Dr. Kevan Peers.  Patient was cleared to begin Eliquis  for both DVT prophylaxis and transient atrial fibrillation that they would remain on until follow-up cardiology services.  Hospital course acute blood loss anemia 8.6 and monitored.  Mild AKI 2.08 improved with IV fluids latest creatinine 0.84.  Therapy evaluations completed touchdown weightbearing right lower extremity.  Patient was admitted for a comprehensive rehab program   Hospital Course: REEVA DAVERN was admitted to rehab 04/09/2024 for inpatient therapies to consist of PT, ST and OT at least three hours five days a week. Past admission physiatrist, therapy team and rehab RN have worked together to provide customized collaborative inpatient rehab.  Pertaining to patient's right supracondylar periprosthetic distal femur fracture.  Status post open reduction internal fixation  04/04/2024 per Dr. Kevan Peers.  Touchdown weightbearing for 6 weeks.  Maintained on Eliquis  for DVT prophylaxis as well as transient atrial fibrillation venous Doppler studies negative patient will continue Eliquis  until follow-up cardiology services.  Cardiac rate remained controlled continued on Lopressor  as directed no chest pain or shortness of breath.  Pain management with use of Lyrica  25 mg twice daily as well as oxycodone  for breakthrough pain.  Acute blood loss anemia as well as history of normocytic anemia iron supplement as directed no overt bleeding episodes.  Mood stabilization with Zoloft  as well as trazodone  to help aid in sleep.  Obesity BMI 30.85 with dietary follow-up.  Bouts of constipation resolved with laxative assistance.  AKI resolved with close monitoring of chemistries   Blood pressures were monitored on TID basis and remained controlled and monitored     Rehab course: During patient's stay in rehab weekly team conferences were held to monitor patient's progress, set goals and discuss barriers to discharge. At admission, patient required minimal assist lateral scoot transfers +2 physical assist sit to stand  Physical exam.  Blood pressure 129/64 pulse 74 temperature 97.6 respirations 18 oxygen saturation 93% room air Constitutional.  No acute distress HEENT Head.  Normocephalic and atraumatic Eyes.  Pupils round and reactive to light no discharge without nystagmus Neck.  Supple nontender no JVD without thyromegaly Cardiac regular rate and rhythm without any  extra sounds or murmur heard Abdomen.  Soft nontender positive bowel sounds without rebound Respiratory effort normal no respiratory distress without wheeze Skin.  Right hip incision dressed.  Appropriately tender.  Lateral aspect of hip moderate swelling Neurologic.  Alert oriented x 3 Musculoskeletal Upper extremities 5 -/5 Left lower extremity 5 -/5 throughout Right lower extremity hip flexor 2/5 KE 5 -/5 and DF/PF 5  -/5 limited by pain  He/She  has had improvement in activity tolerance, balance, postural control as well as ability to compensate for deficits. He/She has had improvement in functional use RUE/LUE  and RLE/LLE as well as improvement in awareness.  Working with energy conservation techniques.  Therapist demonstrated stand pivot transfer techniques/sequencing for improved mechanics and safety to reduce fall risk with emphasis on not crossing legs.  Patient return demonstration recliner to wheelchair and rolling walker and moderate assist for power up and minimal assist for pivot with rolling walker and increased time.  Perform stand pivot transfers wheelchair to car simulated with rolling walker.  Perform stand pivot transfers wheelchair to rehab apartment minimal assist with rolling walker.  Perform bed mobility on apartment bed with supervision.  Perform stand pivot transfers wheelchair to bedside commode and bedside commode to recliner with rolling walker minimal assist.  Patient perform PeriCare and donning/doffing pants with assistance as well as time constraints.  She was able to ambulate short distances rolling walker minimal assist maintaining weightbearing precautions.  Team conference was held in regards to discharge planning and patient and family had requested discharge to skilled nursing facility       Disposition:  Discharge disposition: 03-Skilled Nursing Facility        Diet: Regular  Special Instructions: No driving smoking or alcohol  Touchdown weightbearing right lower extremity  Medications at discharge 1.  Tylenol  as needed 2.  Eliquis  5 mg p.o. twice daily 3.  Cosopt  ophthalmic solution 1 drop both eyes twice daily 4.  Ferrous sulfate  325 mg p.o. daily 5.  Folic acid  1 mg p.o. daily 6.Lumigan ophthalmic solution 0.01% 1 drop both eyes bedtime 7.  Lopressor  25 mg p.o. twice daily 8.  Multivitamin daily 9.  Oxycodone  5 mg every 6 hours as needed pain 10.  MiraLAX   daily as needed hold for loose stools 11.  Lyrica  25 mg p.o. twice daily 12.  Zoloft  50 mg p.o. daily 13.  Trazodone  150 mg p.o. nightly 14.  Prilosec 40 mg daily 15.  Estradiol  0.0375 mg patch weekly 16.  Vitamin D  50,000 units weekly 17.  Dulcolax tablet 2 tablets at bedtime 18.  Colace 100 mg daily 30-35 minutes were spent completing discharge summary and discharge planning     Follow-up Information     Lylia Sand, MD Follow up.   Specialty: Physical Medicine and Rehabilitation Why: No formal follow-up needed Contact information: [address] [place] [address] [phone]         Hardy Lia, MD Follow up.   Specialty: Orthopedic Surgery Why: Call for appointment Contact information: [address] [place] [address] [phone]                 Signed: Everlyn Hockey Izola Teague 04/24/2024, 9:33 AM

## 2024-04-09 NOTE — Progress Notes (Signed)
 Inpatient Rehabilitation Admission Medication Review by a Pharmacist  A complete drug regimen review was completed for this patient to identify any potential clinically significant medication issues.  High Risk Drug Classes Is patient taking? Indication by Medication  Antipsychotic No   Anticoagulant Yes Apixaban- AF  Antibiotic No   Opioid Yes OxyIR- acute pain  Antiplatelet No   Hypoglycemics/insulin No   Vasoactive Medication Yes Lopressor - HTN  Chemotherapy No   Other Yes Protonix - GERD Zoloft - MDD Trazodone - sleep Lyrica - neuropathic pain     Type of Medication Issue Identified Description of Issue Recommendation(s)  Drug Interaction(s) (clinically significant)     Duplicate Therapy     Allergy     No Medication Administration End Date     Incorrect Dose     Additional Drug Therapy Needed     Significant med changes from prior encounter (inform family/care partners about these prior to discharge).    Other       Clinically significant medication issues were identified that warrant physician communication and completion of prescribed/recommended actions by midnight of the next day:  No   Time spent performing this drug regimen review (minutes):  30   Brandyn Thien BS, PharmD, BCPS Clinical Pharmacist 04/09/2024 2:26 PM  Contact: [phone] after 3 PM  "Be curious, not judgmental..." -Rumalda Counter

## 2024-04-09 NOTE — Discharge Instructions (Signed)
 Inpatient Rehab Discharge Instructions  [name] Discharge date and time: No discharge date for patient encounter.   Activities/Precautions/ Functional Status: Activity: Touchdown weightbearing right lower extremity Diet: regular diet Wound Care: Routine skin checks Functional status:  ___ No restrictions     ___ Walk up steps independently ___ 24/7 supervision/assistance   ___ Walk up steps with assistance ___ Intermittent supervision/assistance  ___ Bathe/dress independently ___ Walk with walker     _x__ Bathe/dress with assistance ___ Walk Independently    ___ Shower independently ___ Walk with assistance    ___ Shower with assistance ___ No alcohol     ___ Return to work/school ________  Special Instructions:  No driving smoking or alcohol  My questions have been answered and I understand these instructions. I will adhere to these goals and the provided educational materials after my discharge from the hospital.  Patient/Caregiver Signature _______________________________ Date __________  Clinician Signature _______________________________________ Date __________  Please bring this form and your medication list with you to all your follow-up doctor's appointments.

## 2024-04-09 NOTE — Progress Notes (Signed)
  Inpatient Rehabilitation Admissions Coordinator   I met with patient and son at bedside. We can admit to CIR today. They are in agreement. I will make the arrangements. Acute team and TOC made aware.  Jeannetta Millman, RN, MSN Rehab Admissions Coordinator [phone] 04/09/2024 10:27 AM

## 2024-04-09 NOTE — Care Management Important Message (Signed)
 Important Message  Patient Details  Name: [name] MRN: [id number] Date of Birth: [birth year]   Important Message Given:  Yes - Medicare IM     Janith Melnick 04/09/2024, 10:37 AM

## 2024-04-09 NOTE — H&P (Signed)
 Physical Medicine and Rehabilitation Admission H&P        Chief Complaint  Patient presents with   Leg Injury  : HPI: [name] is a 89 year old right-handed female with history significant for normocytic anemia, osteoarthritis with bilateral knee and bilateral hip replacement, obesity with BMI 30.85, depression, fibromyalgia, GERD/ulcerative colitis, skin cancer, hyperlipidemia, hypertension, internal hemorrhoids, peripheral neuropathy, stress urinary incontinence.  Per chart review patient lives with son.  Two-level home with ramp to main entrance.  Used a rolling walker inside the home for mobility and electric scooter in the community.  Presented 04/03/2024 after mechanical fall when getting out of bed to answer the phone landing on her right hip.  X-rays and imaging revealed right supracondylar periprosthetic distal femur fracture below total hip arthroplasty.  Patient went into atrial fibrillation with RVR while in the ED.  Admission chemistries unremarkable except WBC 11,100, glucose 109, troponin negative.  The patient received diltiazem  15 mg IVP followed by continuous diltiazem  infusion for atrial fibrillation.  Echocardiogram with ejection fraction of 60 to 65% no wall motion abnormalities.  Patient was transition to p.o Lopressor  and case discussed with cardiology services plan to follow-up outpatient..  Patient was stabilized and underwent right open reduction internal fixation of right supracondylar femur fracture using Smith nephew plate 06/12/4695 per Dr. Guyann Leitz 04/04/2024.  Patient was cleared to begin Eliquis for both DVT prophylaxis and transient atrial fibrillation that they would remain on until follow-up with cardiology services.  Hospital course acute blood loss anemia 8.6 and monitored. Mild AKI 2.08 improved with fluids and lattest CRT 0.84 Therapy evaluations initiated patient TDWB right lower extremity.  Therapy evaluations completed with recommendations of inpatient  rehab services due to patient's decreased functional mobility     Pt reports LBM today- has been using Purewick- ok going without it.  Admits doesn't like to drink water and likely only drinking ~ 1 cup/day.  Hands weak due to neuropathy, but can feed self.    Also L ankle bad neuropathy for 10 years- Otherwise not really any pain in RLE from fx.      Review of Systems  Constitutional:  Negative for chills and fever.  HENT:  Negative for hearing loss.   Eyes:  Negative for blurred vision and double vision.  Respiratory:  Negative for cough and wheezing.        Occasional shortness of breath with exertion  Cardiovascular:  Positive for palpitations and leg swelling. Negative for chest pain.  Gastrointestinal:  Negative for constipation, nausea and vomiting.       GERD  Genitourinary:  Negative for dysuria, flank pain and hematuria.  Musculoskeletal:  Positive for falls, joint pain and myalgias.  Skin:  Negative for rash.  Psychiatric/Behavioral:  Positive for depression. The patient has insomnia.   All other systems reviewed and are negative.       Past Medical History:  Diagnosis Date   Anemia     Arthritis     Complication of anesthesia      "I GOT PNEUMONIA THE NEXT DAY FROM THE ANESTHESIA"   Depression     Fibromyalgia     GERD (gastroesophageal reflux disease)     History of skin cancer     HLD (hyperlipidemia)     HTN (hypertension)     Internal hemorrhoids     Osteoarthritis     Peripheral neuropathy     Shortness of breath     Skin cancer  SUI (stress urinary incontinence, female)     Ulcerative colitis               Past Surgical History:  Procedure Laterality Date   ABDOMINAL HYSTERECTOMY       APPENDECTOMY       BREAST EXCISIONAL BIOPSY Left 08/10/2011     Dr Linell Rhymes   BREAST SURGERY       CARPAL TUNNEL RELEASE Left     CESAREAN SECTION       CHOLECYSTECTOMY       COLONOSCOPY   04/29/2003    internal hemorrhoids   JOINT REPLACEMENT Bilateral  2010 and 2012    KNEES   ORIF FEMUR FRACTURE Right 04/04/2024    Procedure: OPEN REDUCTION INTERNAL FIXATION RIGHT FEMUR FRACTURE;  Surgeon: Hardy Lia, MD;  Location: [facility] OR;  Service: Orthopedics;  Laterality: Right;   TOTAL HIP ARTHROPLASTY Right 08/06/2014    Procedure: RIGHT TOTAL HIP ARTHROPLASTY ANTERIOR APPROACH;  Surgeon: Aurther Blue, MD;  Location: [facility];  Service: Orthopedics;  Laterality: Right;   TOTAL HIP ARTHROPLASTY Left 12/17/2014    Procedure: LEFT TOTAL HIP ARTHROPLASTY ANTERIOR APPROACH;  Surgeon: Aurther Blue, MD;  Location: [facility];  Service: Orthopedics;  Laterality: Left;             Family History  Problem Relation Age of Onset   Colon cancer Mother 2   Diabetes Brother          Social History:  reports that she has never smoked. She has never used smokeless tobacco. She reports that she does not drink alcohol and does not use drugs. Allergies:  Allergies       Allergies  Allergen Reactions   Procaine Rash and Other (See Comments)      Novocaine caused face blisters.            Medications Prior to Admission  Medication Sig Dispense Refill   acetaminophen  (TYLENOL ) 650 MG CR tablet Take 1,300 mg by mouth daily as needed for pain.       aspirin  EC 81 MG tablet Take 81 mg by mouth daily.       bimatoprost (LUMIGAN) 0.01 % SOLN Place 1 drop into both eyes at bedtime.       docusate sodium  (COLACE) 100 MG capsule Take 100 mg by mouth daily.       dorzolamide -timolol  (COSOPT ) 22.3-6.8 MG/ML ophthalmic solution Place 1 drop into both eyes 2 (two) times daily.       estradiol  (CLIMARA  - DOSED IN MG/24 HR) 0.0375 mg/24hr patch Place 0.0375 mg onto the skin once a week.       gabapentin  (NEURONTIN ) 400 MG capsule Take 400 mg by mouth daily.       lisinopril-hydrochlorothiazide  (ZESTORETIC) 10-12.5 MG tablet Take 1 tablet by mouth daily.       omeprazole  (PRILOSEC) 40 MG capsule Take 1 capsule (40 mg total) by mouth daily. 30 capsule 0   sertraline   (ZOLOFT ) 50 MG tablet Take 50 mg by mouth daily.       traZODone  (DESYREL ) 100 MG tablet Take 200 mg by mouth at bedtime.        Vitamin D, Ergocalciferol, (DRISDOL) 1.25 MG (50000 UNIT) CAPS capsule Take 50,000 Units by mouth once a week.                  Home: Home Living Family/patient expects to be discharged to:: Private residence Living Arrangements: Children Available Help at Discharge: Family,  Available PRN/intermittently Type of Home: House Home Access: Level entry Home Layout: Two level, Other (Comment) (ramp to main level of house) Bathroom Shower/Tub: Engineer, manufacturing systems: Standard Bathroom Accessibility: No Home Equipment: Art gallery manager, Agricultural consultant (2 wheels), Rollator (4 wheels), Shower seat, Other (comment) (adjustable bed)  Lives With: Son (daughter-in-law)   Functional History: Prior Function Prior Level of Function : Independent/Modified Independent, Driving Mobility Comments: RW vs rollator in home. Electric scooter outside. ADLs Comments: sits down in tub to bathe   Functional Status:  Mobility: Bed Mobility Overal bed mobility: Needs Assistance Bed Mobility: Supine to Sit Rolling: Min assist, Used rails Supine to sit: Min assist Sit to supine: Mod assist, Used rails General bed mobility comments: assist for R LE and to scoot hip Transfers Overall transfer level: Needs assistance Equipment used: Ambulation equipment used Transfers: Sit to/from Stand, Bed to chair/wheelchair/BSC Sit to Stand: Max assist, +2 physical assistance Bed to/from chair/wheelchair/BSC transfer type:: Via Lift equipment  Lateral/Scoot Transfers: Min assist, +2 safety/equipment Transfer via Lift Equipment: [employer] transfer comment: stood to [place] with mod A and cues for NWB R LE. seated on flaps to move to recliner and assist for standing from flaps and to remove for A to lower to recliner padded with pillows Ambulation/Gait General Gait Details:  unable   ADL: ADL Overall ADL's : Needs assistance/impaired Eating/Feeding: Independent, Sitting Eating/Feeding Details (indicate cue type and reason): increased spillage due to tremor Grooming: Set up, Sitting Upper Body Bathing: Minimal assistance, Sitting Lower Body Bathing: Total assistance, Bed level Upper Body Dressing : Set up, Sitting Lower Body Dressing: Total assistance, Bed level Toileting- Clothing Manipulation and Hygiene: Total assistance, Bed level Functional mobility during ADLs: Moderate assistance, +2 for safety/equipment (simulated lateral scoot at EOB)   Cognition: Cognition Orientation Level: Oriented X4 Cognition Arousal: Alert Behavior During Therapy: WFL for tasks assessed/performed   Physical Exam: Blood pressure 129/64, pulse 74, temperature 97.6 F (36.4 C), temperature source Oral, resp. rate 20, height 5\' 10"  (1.778 m), weight 97.5 kg, SpO2 93%. Physical Exam Vitals and nursing note reviewed.  Constitutional:      General: She is not in acute distress.    Appearance: Normal appearance. She is obese.     Comments: Pt dressed well- purple and red; sitting up in bed; appears younger than stated age, appropriate, NAD  HENT:     Head: Normocephalic and atraumatic.     Right Ear: External ear normal.     Left Ear: External ear normal.     Nose: Nose normal. No congestion.     Mouth/Throat:     Mouth: Mucous membranes are dry.     Pharynx: Oropharynx is clear. No oropharyngeal exudate.  Eyes:     General:        Right eye: No discharge.        Left eye: No discharge.     Extraocular Movements: Extraocular movements intact.  Cardiovascular:     Rate and Rhythm: Normal rate and regular rhythm.     Heart sounds: Normal heart sounds. No murmur heard.    No gallop.  Pulmonary:     Effort: Pulmonary effort is normal. No respiratory distress.     Breath sounds: Normal breath sounds. No wheezing, rhonchi or rales.  Abdominal:     General: Bowel sounds  are normal. There is no distension.     Palpations: Abdomen is soft.     Tenderness: There is no abdominal tenderness.  Genitourinary:  Comments: Purewick attached to pt with very dark amber/almost beer appearing urine Musculoskeletal:     Cervical back: Neck supple. No tenderness.     Comments: Ue's 5-/5 LLE 5-/5 throughout RLE_ HF 2/5; KE 5-/5 and DF/PF 5-/5- limited by pain when testing MSK strength  Skin:    General: Skin is warm and dry.     Comments: Right hip incision is dressed.  Appropriately tender Lateral aspect of hip moderate swelling with dressing in place C/D/I Trace B/L LE's feet swelling  Neurological:     Mental Status: She is alert.     Comments: Patient is alert makes eye contact with examiner.  Follows full commands. Decreased to light touch in hands B/L- not rest of Ue's- B/L as well as L1 to S2 B/L- worse in LE's- more sensory deficits   Psychiatric:        Mood and Affect: Mood normal.        Behavior: Behavior normal.        Lab Results Last 48 Hours        Results for orders placed or performed during the hospital encounter of 04/03/24 (from the past 48 hours)  CBC     Status: Abnormal    Collection Time: 04/08/24  6:24 AM  Result Value Ref Range    WBC 9.1 4.0 - 10.5 K/uL    RBC 2.94 (L) 3.87 - 5.11 MIL/uL    Hemoglobin 8.6 (L) 12.0 - 15.0 g/dL    HCT 41.3 (L) 24.4 - 46.0 %    MCV 90.1 80.0 - 100.0 fL    MCH 29.3 26.0 - 34.0 pg    MCHC 32.5 30.0 - 36.0 g/dL    RDW 01.0 27.2 - 53.6 %    Platelets 206 150 - 400 K/uL    nRBC 0.0 0.0 - 0.2 %      Comment: Performed at [facility], [address] [place], [place] [address]  Basic metabolic panel with GFR     Status: Abnormal    Collection Time: 04/08/24  6:24 AM  Result Value Ref Range    Sodium 138 135 - 145 mmol/L    Potassium 3.7 3.5 - 5.1 mmol/L    Chloride 104 98 - 111 mmol/L    CO2 24 22 - 32 mmol/L    Glucose, Bld 106 (H) 70 - 99 mg/dL      Comment: Glucose reference range  applies only to samples taken after fasting for at least 8 hours.    BUN 20 8 - 23 mg/dL    Creatinine, Ser 4.74 0.44 - 1.00 mg/dL    Calcium  9.3 8.9 - 10.3 mg/dL    GFR, Estimated >25 >95 mL/min      Comment: (NOTE) Calculated using the CKD-EPI Creatinine Equation (2021)      Anion gap 10 5 - 15      Comment: Performed at [facility], [address], [place], [place] [address]       Imaging Results (Last 48 hours)  VAS US  LOWER EXTREMITY VENOUS (DVT) Result Date: 04/08/2024  Lower Venous DVT Study Patient Name:  [name]  Date of Exam:   04/08/2024 Medical Rec #: [id number]         Accession #:    [id number] Date of Birth: [birth year]         Patient Gender: F Patient Age:   62 years Exam Location:  [facility] Procedure:  VAS US  LOWER EXTREMITY VENOUS (DVT) Referring Phys: PRANAV PATEL --------------------------------------------------------------------------------  Indications: Edema.  Risk Factors: Cancer skin Surgery Open reduction RT femur fracture 04/04/24 past pregnancy and obesity. Comparison Study: None. Performing Technologist: Estanislao Heimlich  Examination Guidelines: A complete evaluation includes B-mode imaging, spectral Doppler, color Doppler, and power Doppler as needed of all accessible portions of each vessel. Bilateral testing is considered an integral part of a complete examination. Limited examinations for reoccurring indications may be performed as noted. The reflux portion of the exam is performed with the patient in reverse Trendelenburg.  +---------+---------------+---------+-----------+----------+--------------+ RIGHT    CompressibilityPhasicitySpontaneityPropertiesThrombus Aging +---------+---------------+---------+-----------+----------+--------------+ CFV      Full           Yes      Yes                                 +---------+---------------+---------+-----------+----------+--------------+ SFJ      Full                                                         +---------+---------------+---------+-----------+----------+--------------+ FV Prox  Full                                                        +---------+---------------+---------+-----------+----------+--------------+ FV Mid   Full                                                        +---------+---------------+---------+-----------+----------+--------------+ FV DistalFull                                                        +---------+---------------+---------+-----------+----------+--------------+ PFV      Full                                                        +---------+---------------+---------+-----------+----------+--------------+ POP      Full           Yes      Yes                                 +---------+---------------+---------+-----------+----------+--------------+ PTV      Full                    Yes                                 +---------+---------------+---------+-----------+----------+--------------+ PERO     Full  Yes                                 +---------+---------------+---------+-----------+----------+--------------+   +----+---------------+---------+-----------+----------+--------------+ LEFTCompressibilityPhasicitySpontaneityPropertiesThrombus Aging +----+---------------+---------+-----------+----------+--------------+ CFV Full           Yes      Yes                                 +----+---------------+---------+-----------+----------+--------------+     Summary: RIGHT: - There is no evidence of deep vein thrombosis in the lower extremity.  - No cystic structure found in the popliteal fossa.  LEFT: - No evidence of common femoral vein obstruction.   *See table(s) above for measurements and observations.    Preliminary     DG Abd Portable 1V Result Date: 04/08/2024 CLINICAL DATA:  Shortness of breath, constipation EXAM: PORTABLE ABDOMEN - 1 VIEW COMPARISON:  CT abdomen  08/15/2022 : The amount of gas and formed stool in the colon is within normal limits. No dilated bowel observed. Bilateral total hip prostheses noted. Lower lumbar spondylosis and degenerative disc disease. The lung bases appear clear. IMPRESSION: 1. No acute findings. 2. Lower lumbar spondylosis and degenerative disc disease. Electronically Signed   By: Freida Jes M.D.   On: 04/08/2024 13:51    DG CHEST PORT 1 VIEW Result Date: 04/08/2024 CLINICAL DATA:  Shortness of breath EXAM: PORTABLE CHEST 1 VIEW COMPARISON:  Chest radiograph dated 04/03/2024 FINDINGS: Patient is rotated slightly to the right. Normal lung volumes. Bibasilar patchy opacities. No pleural effusion or pneumothorax. The heart size and mediastinal contours are within normal limits. No acute osseous abnormality. IMPRESSION: Bibasilar patchy opacities, likely atelectasis. Aspiration or pneumonia can be considered in the appropriate clinical setting. Electronically Signed   By: Limin  Xu M.D.   On: 04/08/2024 13:50           Blood pressure 129/64, pulse 74, temperature 97.6 F (36.4 C), temperature source Oral, resp. rate 20, height 5\' 10"  (1.778 m), weight 97.5 kg, SpO2 93%.   Medical Problem List and Plan: 1. Functional deficits secondary to right supracondylar periprosthetic distal femur fracture.  Status post open reduction internal fixation 04/04/2024 per Dr. Guyann Leitz. TDWB right lower extremity for 4-6 weeks             -patient may  shower             -ELOS/Goals: 12-14 days Supervision to min A             Admit to CIR 2.  Antithrombotics: -DVT/anticoagulation:  Pharmaceutical: Eliquis.  Venous Doppler study negative.             -antiplatelet therapy: N/A 3. Pain Management: Lyrica  25 mg BID,, oxycodone  as needed- more pain from peripheral neuropathy than her RLE- mainly L ankle per pt 4. Mood/Behavior/Sleep: Zoloft  50 mg daily, trazodone  150 mg nightly.  Provide emotional support             -antipsychotic agents:  N/A 5. Neuropsych/cognition: This patient is capable of making decisions on her own behalf. 6. Skin/Wound Care: Routine skin checks 7. Fluids/Electrolytes/Nutrition: Routine in and outs with follow-up chemistries 8.  Acute blood loss anemia as well as history of normocytic anemia.  Continue iron supplement.  Follow-up CBC 9.  Transient atrial fibrillation with RVR Lopressor  25 mg twice daily.  Follow-up cardiology services.  Continue Eliquis until further  notice.. 10.  Obesity.  BMI 30.85.  Dietary follow-up 11.  Constipation.  MiraLAX  twice daily, Senokot daily, Colace 100 mg daily. 12.AKI.Resolved.Follow up chemestries     Everlyn Hockey Angiulli, PA-C 04/09/2024     I have personally performed a face to face diagnostic evaluation of this patient and formulated the key components of the plan.  Additionally, I have personally reviewed laboratory data, imaging studies, as well as relevant notes and concur with the physician assistant's documentation above.   The patient's status has not changed from the original H&P.  Any changes in documentation from the acute care chart have been noted above.

## 2024-04-09 NOTE — Discharge Summary (Signed)
 Physician Discharge Summary   Patient: [name] MRN: [id number] DOB: 03-22-1930  Admit date:     04/03/2024  Discharge date: 04/09/24  Discharge Physician: [name]  PCP: [name]  Recommendations at discharge:  Follow up with PCP in 1 week   Follow-up Information     Jessica Lia, MD. Schedule an appointment as soon as possible for a visit in 2 week(s).   Specialty: Orthopedic Surgery Contact information: [address] [place] [address] [phone]         PCP. Schedule an appointment as soon as possible for a visit in 1 week(s).                 Discharge Diagnoses: Principal Problem:   Essential hypertension Active Problems:   Hyperlipidemia   Depression   GERD   Normocytic anemia   Hyperglycemia   Closed displaced oblique fracture of shaft of right femur (HCC)   Atrial fibrillation with RVR (HCC)   New onset atrial fibrillation (HCC)   Glaucoma   Aortic atherosclerosis [facility])  Hospital Course: 89 year old past medical history significant for anemia, depression, fibromyalgia, GERD, hypertension, internal hemorrhoids, osteoarthritis, peripheral neuropathy, ulcerative colitis presented to the ED following a mechanical fall, subsequently injured her right lower leg.  In the ED she was found to be in A-fib with RVR and did converted to sinus rhythm on diltiazem .   X-ray showed hip pelvis oblique fracture of the distal femoral metaphysis.  Orthopedic consulted and patient underwent open reduction internal fixation of the right supracondylar femur fracture using a Smith Nephew Plate.   Assessment and Plan: Right supracondylar periprosthetic distal femur fracture below total hip arthroplasty; Orthopedics consulted. Patient underwent reduction internal fixation of the right supracondylar femur fracture using a Smith [employer]hew plate Per Ortho ok to start Eliquis PT OT recommended CIR.  Insurance approved to stay.  CIR will admit the patient on  5/6.   Paroxysmal atrial fibrillation with RVR (HCC) Transient,  Required Cardizem  drip.  Converted to sinus rhythm. Changed to oral diltiazem  Prior provider discussed with cardiology.  d/w Cards, they arranged FU 5/8 at 9am [place] DS2 VAS: 4 Patient was started on Eliquis.  Tolerating it well. Prior provider discussed anticoagulation with family.discussed risk benefit, previous history of fall. They will follow up with cardiology out patient to determine if they will continue indefinitely  LVH with intracavitary gradient. Will switch Cardizem  to to Lopressor .  Right lower extremity edema. Postoperative blood Ultrasound venous Doppler was performed which was negative for any DVT.  Atelectasis. Postop.  Patient had some hypoxia. Does not use oxygen at her baseline. A chest x-ray was performed which was negative for any significant infiltrate. Suspect this was atelectasis. Continue incentive spirometry.  Constipation. Patient reported some abdominal distention and constipation. Tells me that he takes Dulcolax on a daily basis at home. Tells me that she has not been passing gas today. X-ray abdomen was performed which was unremarkable for any acute abnormality. Moderate stool burden.  Resume home medication for bowel regimen. Gonzalez obstruction.   AKI;  post op , in setting hypotension.  Baseline serum creatinine 0.9.  Serum creatinine on 5/1 went up to 2.28. Improving back to 0.8 again.  Acute Blood loss Anemia:  Postsurgery expected hemoglobin on admission 11 down to 8s Continue iron and vitamin supplementation.   Depression Continue sertraline  Continue trazodone     Essential hypertension Home regimen includes lisinopril HCTZ. In the setting of AKI would discontinue this medication. Was  started on Cardizem  for A-fib. Given presence of intraventricular gradient switch to Lopressor .   GERD Continue pantoprazole  40 mg p.o. daily.  Obesity. Body mass index is 30.85 kg/m.   Placing the patient at a high risk of poor outcome.  Leukocytosis. Postop Improving.  Chronic neuropathy. Patient is on gabapentin  at home.  She does not think that it is helping her. Would like to switch to Lyrica . Will initiate 25 mg twice daily Lyrica  which will be equivalent to 300 mg of gabapentin .  Pain control - [place]  Controlled Substance Reporting System database was reviewed. and patient was instructed, not to drive, operate heavy machinery, perform activities at heights, swimming or participation in water activities or provide baby-sitting services while on Pain, Sleep and Anxiety Medications; until their outpatient Physician has advised to do so again. Also recommended to not to take more than prescribed Pain, Sleep and Anxiety Medications.  Consultants:  Orthopedics   Procedures performed:  OPEN REDUCTION INTERNAL FIXATION OF RIGHT SUPRACONDYLAR FEMUR   DISCHARGE MEDICATION: Allergies as of 04/09/2024       Reactions   Procaine Rash, Other (See Comments)   Novocaine caused face blisters.        Medication List     STOP taking these medications    aspirin  EC 81 MG tablet   gabapentin  400 MG capsule Commonly known as: NEURONTIN    lisinopril-hydrochlorothiazide  10-12.5 MG tablet Commonly known as: ZESTORETIC       TAKE these medications    acetaminophen  650 MG CR tablet Commonly known as: TYLENOL  Take 1,300 mg by mouth daily as needed for pain.   apixaban 5 MG Tabs tablet Commonly known as: ELIQUIS Take 1 tablet (5 mg total) by mouth 2 (two) times daily.   bisacodyl  5 MG EC tablet Commonly known as: DULCOLAX Take 2 tablets (10 mg total) by mouth at bedtime.   bisacodyl  10 MG suppository Commonly known as: DULCOLAX Place 1 suppository (10 mg total) rectally daily as needed for moderate constipation.   docusate sodium  100 MG capsule Commonly known as: COLACE Take 100 mg by mouth daily.   dorzolamide -timolol  2-0.5 % ophthalmic  solution Commonly known as: COSOPT  Place 1 drop into both eyes 2 (two) times daily.   estradiol  0.0375 mg/24hr patch Commonly known as: CLIMARA  - Dosed in mg/24 hr Place 0.0375 mg onto the skin once a week.   feeding supplement Liqd Take 237 mLs by mouth 2 (two) times daily between meals.   ferrous sulfate  325 (65 FE) MG tablet Take 1 tablet (325 mg total) by mouth daily with breakfast.   folic acid 1 MG tablet Commonly known as: FOLVITE Take 1 tablet (1 mg total) by mouth daily.   Lumigan 0.01 % Soln Generic drug: bimatoprost Place 1 drop into both eyes at bedtime.   metoprolol  tartrate 25 MG tablet Commonly known as: LOPRESSOR  Take 1 tablet (25 mg total) by mouth 2 (two) times daily.   omeprazole  40 MG capsule Commonly known as: PRILOSEC Take 1 capsule (40 mg total) by mouth daily.   oxyCODONE  5 MG immediate release tablet Commonly known as: Oxy IR/ROXICODONE  Take 1 tablet (5 mg total) by mouth every 6 (six) hours as needed for moderate pain (pain score 4-6) or severe pain (pain score 7-10).   pregabalin  25 MG capsule Commonly known as: LYRICA  Take 1 capsule (25 mg total) by mouth 2 (two) times daily.   sertraline  50 MG tablet Commonly known as: ZOLOFT  Take 50 mg by mouth daily.  traZODone  100 MG tablet Commonly known as: DESYREL  Take 200 mg by mouth at bedtime.   Vitamin D (Ergocalciferol) 1.25 MG (50000 UNIT) Caps capsule Commonly known as: DRISDOL Take 50,000 Units by mouth once a week.               Discharge Care Instructions  (From admission, onward)           Start     Ordered   04/08/24 0000  Discharge wound care:       Comments: Reinforce dressing   04/08/24 1626           Disposition: CIR Diet recommendation: Cardiac diet  Discharge Exam: Vitals:   04/08/24 2323 04/09/24 0540 04/09/24 0803 04/09/24 1027  BP: 129/64 106/89 123/63 (!) 163/63  Pulse: 74 81 64 82  Resp: 20 18 (!) 21   Temp: 97.6 F (36.4 C) 98.3 F (36.8  C) 97.7 F (36.5 C)   TempSrc: Oral Oral Oral   SpO2: 93% 93% 93%   Weight:      Height:       General: Appear in mild distress; Gonzalez visible Abnormal Neck Mass Or lumps, Conjunctiva normal Cardiovascular: S1 and S2 Present, Gonzalez Murmur, Respiratory: good respiratory effort, Bilateral Air entry present and faint basal Crackles, Gonzalez wheezes Abdomen: Bowel Sound present, Non tender  Extremities: right Pedal edema Neurology: alert and oriented to time, place, and person  Filed Weights   04/03/24 1034  Weight: 97.5 kg   Condition at discharge: stable  The results of significant diagnostics from this hospitalization (including imaging, microbiology, ancillary and laboratory) are listed below for reference.   Imaging Studies: VAS US  LOWER EXTREMITY VENOUS (DVT) Result Date: 04/08/2024  Lower Venous DVT Study Patient Name:  LAURAINE DESRUISSEAUX  Date of Exam:   04/08/2024 Medical Rec #: [id number]         Accession #:    [id number] Date of Birth: 13-Aug-1930         Patient Gender: F Patient Age:   3 years Exam Location:  [facility] Procedure:      VAS US  LOWER EXTREMITY VENOUS (DVT) Referring Phys: Maurica Omura --------------------------------------------------------------------------------  Indications: Edema.  Risk Factors: Cancer skin Surgery Open reduction RT femur fracture 04/04/24 past pregnancy and obesity. Comparison Study: None. Performing Technologist: Estanislao Heimlich  Examination Guidelines: A complete evaluation includes B-mode imaging, spectral Doppler, color Doppler, and power Doppler as needed of all accessible portions of each vessel. Bilateral testing is considered an integral part of a complete examination. Limited examinations for reoccurring indications may be performed as noted. The reflux portion of the exam is performed with the patient in reverse Trendelenburg.  +---------+---------------+---------+-----------+----------+--------------+ RIGHT     CompressibilityPhasicitySpontaneityPropertiesThrombus Aging +---------+---------------+---------+-----------+----------+--------------+ CFV      Full           Yes      Yes                                 +---------+---------------+---------+-----------+----------+--------------+ SFJ      Full                                                        +---------+---------------+---------+-----------+----------+--------------+ FV Prox  Full                                                        +---------+---------------+---------+-----------+----------+--------------+  FV Mid   Full                                                        +---------+---------------+---------+-----------+----------+--------------+ FV DistalFull                                                        +---------+---------------+---------+-----------+----------+--------------+ PFV      Full                                                        +---------+---------------+---------+-----------+----------+--------------+ POP      Full           Yes      Yes                                 +---------+---------------+---------+-----------+----------+--------------+ PTV      Full                    Yes                                 +---------+---------------+---------+-----------+----------+--------------+ PERO     Full                    Yes                                 +---------+---------------+---------+-----------+----------+--------------+   +----+---------------+---------+-----------+----------+--------------+ LEFTCompressibilityPhasicitySpontaneityPropertiesThrombus Aging +----+---------------+---------+-----------+----------+--------------+ CFV Full           Yes      Yes                                 +----+---------------+---------+-----------+----------+--------------+     Summary: RIGHT: - There is Gonzalez evidence of deep vein thrombosis in the lower  extremity.  - Gonzalez cystic structure found in the popliteal fossa.  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   *See table(s) above for measurements and observations.    Preliminary    DG Abd Portable 1V Result Date: 04/08/2024 CLINICAL DATA:  Shortness of breath, constipation EXAM: PORTABLE ABDOMEN - 1 VIEW COMPARISON:  CT abdomen 08/15/2022 : The amount of gas and formed stool in the colon is within normal limits. Gonzalez dilated bowel observed. Bilateral total hip prostheses noted. Lower lumbar spondylosis and degenerative disc disease. The lung bases appear clear. IMPRESSION: 1. Gonzalez acute findings. 2. Lower lumbar spondylosis and degenerative disc disease. Electronically Signed   By: Freida Jes M.D.   On: 04/08/2024 13:51   DG CHEST PORT 1 VIEW Result Date: 04/08/2024 CLINICAL DATA:  Shortness of breath EXAM: PORTABLE CHEST 1 VIEW COMPARISON:  Chest radiograph dated 04/03/2024 FINDINGS: Patient is rotated slightly to the right. Normal lung volumes. Bibasilar patchy  opacities. Gonzalez pleural effusion or pneumothorax. The heart size and mediastinal contours are within normal limits. Gonzalez acute osseous abnormality. IMPRESSION: Bibasilar patchy opacities, likely atelectasis. Aspiration or pneumonia can be considered in the appropriate clinical setting. Electronically Signed   By: Limin  Xu M.D.   On: 04/08/2024 13:50   DG FEMUR PORT, MIN 2 VIEWS RIGHT Result Date: 04/04/2024 CLINICAL DATA:  Fracture, post ORIF. EXAM: RIGHT FEMUR PORTABLE 2 VIEW COMPARISON:  Preoperative imaging FINDINGS: Lateral plate and screw fixation of femoral shaft fracture. Improved fracture alignment from preoperative imaging. Previous knee and hip arthroplasty. Soft tissue air and edema at the operative site. IMPRESSION: ORIF of femoral shaft fracture. Electronically Signed   By: Chadwick Colonel M.D.   On: 04/04/2024 13:06   DG FEMUR, MIN 2 VIEWS RIGHT Result Date: 04/04/2024 CLINICAL DATA:  Elective surgery. EXAM: RIGHT  FEMUR 2 VIEWS COMPARISON:  Preoperative imaging FINDINGS: Six fluoroscopic spot views of the femur submitted from the operating room. Plate and screw fixation of femoral shaft fracture. Previous knee and hip arthroplasty. Fluoroscopy time 1 minute 24 seconds. Dose 11.99 mGy. IMPRESSION: Intraoperative fluoroscopy during femoral shaft fracture fixation. Electronically Signed   By: Chadwick Colonel M.D.   On: 04/04/2024 11:57   DG C-Arm 1-60 Min-Gonzalez Report Result Date: 04/04/2024 Fluoroscopy was utilized by the requesting physician.  Gonzalez radiographic interpretation.   DG C-Arm 1-60 Min-Gonzalez Report Result Date: 04/04/2024 Fluoroscopy was utilized by the requesting physician.  Gonzalez radiographic interpretation.   ECHOCARDIOGRAM COMPLETE Result Date: 04/03/2024    ECHOCARDIOGRAM REPORT   Patient Name:   SHWANDA BENYO Date of Exam: 04/03/2024 Medical Rec #:  [id number]        Height:       70.0 in Accession #:    [id number]       Weight:       215.0 lb Date of Birth:  [birth year]        BSA:          2.152 m Patient Age:    89 years         BP:           92/55 mmHg Patient Gender: F                HR:           114 bpm. Exam Location:  Inpatient Procedure: 2D Echo, Cardiac Doppler and Color Doppler (Both Spectral and Color            Flow Doppler were utilized during procedure). Indications:    Arrhythmia  History:        Patient has Gonzalez prior history of Echocardiogram examinations.                 Signs/Symptoms:Shortness of Breath; Risk Factors:Hypertension.  Sonographer:    Astrid Blamer Referring Phys: [id number] DAVID MANUEL ORTIZ IMPRESSIONS  1. Intracavitary gradient. Peak velocity 2.48 m/s. Peak gradient 24.7 mmHg. Left ventricular ejection fraction, by estimation, is 60 to 65%. The left ventricle has normal function. The left ventricle has Gonzalez regional wall motion abnormalities. There is moderate asymmetric left ventricular hypertrophy of the septal segment. Left ventricular diastolic function could not be evaluated.   2. Right ventricular systolic function is normal. The right ventricular size is normal.  3. Left atrial size was moderately dilated.  4. The mitral valve is normal in structure. Trivial mitral valve regurgitation. Gonzalez evidence of mitral stenosis.  5. The aortic valve is tricuspid. There is  mild calcification of the aortic valve. There is mild thickening of the aortic valve. Aortic valve regurgitation is not visualized. Gonzalez aortic stenosis is present. FINDINGS  Left Ventricle: Intracavitary gradient. Peak velocity 2.48 m/s. Peak gradient 24.7 mmHg. Left ventricular ejection fraction, by estimation, is 60 to 65%. The left ventricle has normal function. The left ventricle has Gonzalez regional wall motion abnormalities. The left ventricular internal cavity size was normal in size. There is moderate asymmetric left ventricular hypertrophy of the septal segment. Left ventricular diastolic function could not be evaluated due to atrial fibrillation. Left ventricular diastolic function could not be evaluated. Indeterminate filling pressures. Right Ventricle: The right ventricular size is normal. Gonzalez increase in right ventricular wall thickness. Right ventricular systolic function is normal. Left Atrium: Left atrial size was moderately dilated. Right Atrium: Right atrial size was normal in size. Pericardium: There is Gonzalez evidence of pericardial effusion. Mitral Valve: The mitral valve is normal in structure. Trivial mitral valve regurgitation. Gonzalez evidence of mitral valve stenosis. Tricuspid Valve: The tricuspid valve is normal in structure. Tricuspid valve regurgitation is trivial. Gonzalez evidence of tricuspid stenosis. Aortic Valve: The aortic valve is tricuspid. There is mild calcification of the aortic valve. There is mild thickening of the aortic valve. Aortic valve regurgitation is not visualized. Gonzalez aortic stenosis is present. Aortic valve peak gradient measures 8.8 mmHg. Pulmonic Valve: The pulmonic valve was normal in structure.  Pulmonic valve regurgitation is not visualized. Gonzalez evidence of pulmonic stenosis. Aorta: The aortic root is normal in size and structure. Venous: The inferior vena cava was not well visualized. IAS/Shunts: Gonzalez atrial level shunt detected by color flow Doppler.  LEFT VENTRICLE PLAX 2D LVIDd:         3.80 cm   Diastology LVIDs:         2.20 cm   LV e' medial:    8.16 cm/s LV PW:         0.90 cm   LV E/e' medial:  12.1 LV IVS:        1.40 cm   LV e' lateral:   11.20 cm/s LVOT diam:     1.80 cm   LV E/e' lateral: 8.8 LV SV:         47 LV SV Index:   22 LVOT Area:     2.54 cm  RIGHT VENTRICLE RV S prime:     11.70 cm/s TAPSE (M-mode): 1.5 cm LEFT ATRIUM           Index        RIGHT ATRIUM           Index LA Vol (A4C): 85.1 ml 39.54 ml/m  RA Area:     10.20 cm                                    RA Volume:   19.90 ml  9.25 ml/m  AORTIC VALVE AV Area (Vmax): 2.34 cm AV Vmax:        148.00 cm/s AV Peak Grad:   8.8 mmHg LVOT Vmax:      136.00 cm/s LVOT Vmean:     99.100 cm/s LVOT VTI:       0.183 m  AORTA Ao Root diam: 3.30 cm Ao Asc diam:  3.20 cm MITRAL VALVE               TRICUSPID VALVE MV Area (PHT): 3.10 cm  TR Peak grad:   9.6 mmHg MV E velocity: 98.50 cm/s  TR Vmax:        155.00 cm/s                             SHUNTS                            Systemic VTI:  0.18 m                            Systemic Diam: 1.80 cm Maudine Sos MD Electronically signed by Maudine Sos MD Signature Date/Time: 04/03/2024/7:25:16 PM    Final    DG Hip Unilat W or Wo Pelvis 2-3 Views Right Result Date: 04/03/2024 CLINICAL DATA:  Fall.  Shortening rotation of the right leg. EXAM: RIGHT FEMUR 2 VIEWS; DG HIP (WITH OR WITHOUT PELVIS) 2-3V RIGHT; RIGHT KNEE - COMPLETE 4+ VIEW COMPARISON:  Right hip radiographs 07/05/2022. FINDINGS: And oblique fracture of the distal femoral metaphysis is present just above the femoral component of the total knee arthroplasty. The fracture is displaced laterally and slightly posteriorly.  Valgus angulation is noted. The right hip arthroplasty is intact. Gonzalez fractures are present adjacent to the arthroplasty. The knee arthroplasty is also intact. IMPRESSION: 1. Oblique fracture of the distal femoral metaphysis just above the femoral component of the total knee arthroplasty. 2. The right hip and knee arthroplasty are intact. Electronically Signed   By: Audree Leas M.D.   On: 04/03/2024 11:18   DG Knee Complete 4 Views Right Result Date: 04/03/2024 CLINICAL DATA:  Fall.  Shortening rotation of the right leg. EXAM: RIGHT FEMUR 2 VIEWS; DG HIP (WITH OR WITHOUT PELVIS) 2-3V RIGHT; RIGHT KNEE - COMPLETE 4+ VIEW COMPARISON:  Right hip radiographs 07/05/2022. FINDINGS: And oblique fracture of the distal femoral metaphysis is present just above the femoral component of the total knee arthroplasty. The fracture is displaced laterally and slightly posteriorly. Valgus angulation is noted. The right hip arthroplasty is intact. Gonzalez fractures are present adjacent to the arthroplasty. The knee arthroplasty is also intact. IMPRESSION: 1. Oblique fracture of the distal femoral metaphysis just above the femoral component of the total knee arthroplasty. 2. The right hip and knee arthroplasty are intact. Electronically Signed   By: Audree Leas M.D.   On: 04/03/2024 11:18   DG Femur Min 2 Views Right Result Date: 04/03/2024 CLINICAL DATA:  Fall.  Shortening rotation of the right leg. EXAM: RIGHT FEMUR 2 VIEWS; DG HIP (WITH OR WITHOUT PELVIS) 2-3V RIGHT; RIGHT KNEE - COMPLETE 4+ VIEW COMPARISON:  Right hip radiographs 07/05/2022. FINDINGS: And oblique fracture of the distal femoral metaphysis is present just above the femoral component of the total knee arthroplasty. The fracture is displaced laterally and slightly posteriorly. Valgus angulation is noted. The right hip arthroplasty is intact. Gonzalez fractures are present adjacent to the arthroplasty. The knee arthroplasty is also intact. IMPRESSION: 1.  Oblique fracture of the distal femoral metaphysis just above the femoral component of the total knee arthroplasty. 2. The right hip and knee arthroplasty are intact. Electronically Signed   By: Audree Leas M.D.   On: 04/03/2024 11:18   DG Chest Portable 1 View Result Date: 04/03/2024 CLINICAL DATA:  Marvell Slider. EXAM: PORTABLE CHEST 1 VIEW COMPARISON:  08/15/2022 FINDINGS: Normal sized heart. Tortuous and partially calcified thoracic aorta. Clear lungs  with normal vascularity. Gonzalez fracture or pneumothorax seen. Mild-to-moderate right glenohumeral degenerative changes. IMPRESSION: Gonzalez acute abnormality. Electronically Signed   By: Catherin Closs M.D.   On: 04/03/2024 11:17    Microbiology: Results for orders placed or performed during the hospital encounter of 08/15/22  Blood Culture (routine x 2)     Status: None   Collection Time: 08/15/22  3:58 PM   Specimen: BLOOD  Result Value Ref Range Status   Specimen Description   Final    BLOOD BLOOD RIGHT HAND Performed at [facility], [address] [place], [place] [address]    Special Requests   Final    Blood Culture adequate volume BOTTLES DRAWN AEROBIC AND ANAEROBIC Performed at [facility], [address], [place], [place] [address]    Culture   Final    Gonzalez GROWTH 5 DAYS Performed at [facility], [address], [place], [place] [address]    Report Status 08/20/2022 FINAL  Final  Blood Culture (routine x 2)     Status: None   Collection Time: 08/15/22  4:03 PM   Specimen: BLOOD  Result Value Ref Range Status   Specimen Description   Final    BLOOD BLOOD RIGHT FOREARM Performed at [facility], [address], [place], [place] [address]    Special Requests   Final    BOTTLES DRAWN AEROBIC AND ANAEROBIC Blood Culture adequate volume Performed at [facility], [address], [place], [place] [address]    Culture   Final    Gonzalez GROWTH 5  DAYS Performed at [facility], [address] [place], [place] [address]    Report Status 08/20/2022 FINAL  Final  SARS Coronavirus 2 by RT PCR (hospital order, performed in [facility] hospital lab) *cepheid single result test* Anterior Nasal Swab     Status: None   Collection Time: 08/16/22  4:23 AM   Specimen: Anterior Nasal Swab  Result Value Ref Range Status   SARS Coronavirus 2 by RT PCR NEGATIVE NEGATIVE Final    Comment: (NOTE) SARS-CoV-2 target nucleic acids are NOT DETECTED.  The SARS-CoV-2 RNA is generally detectable in upper and lower respiratory specimens during the acute phase of infection. The lowest concentration of SARS-CoV-2 viral copies this assay can detect is 250 copies / mL. A negative result does not preclude SARS-CoV-2 infection and should not be used as the sole basis for treatment or other patient management decisions.  A negative result may occur with improper specimen collection / handling, submission of specimen other than nasopharyngeal swab, presence of viral mutation(s) within the areas targeted by this assay, and inadequate number of viral copies (<250 copies / mL). A negative result must be combined with clinical observations, patient history, and epidemiological information.  Fact Sheet for Patients:   RoadLapTop.co.za  Fact Sheet for Healthcare Providers: http://kim-miller.com/  This test is not yet approved or  cleared by the [place]  FDA and has been authorized for detection and/or diagnosis of SARS-CoV-2 by FDA under an Emergency Use Authorization (EUA).  This EUA will remain in effect (meaning this test can be used) for the duration of the COVID-19 declaration under Section 564(b)(1) of the Act, 21 U.S.C. section 360bbb-3(b)(1), unless the authorization is terminated or revoked sooner.  Performed at [facility], [address], [place], [place] [address]     Labs: CBC: Recent Labs  Lab 04/04/24 1301 04/05/24 9629 04/06/24 5284 04/07/24 0230 04/08/24 1324  WBC 14.6* 13.8* 13.0* 10.6* 9.1  HGB 9.6* 8.5* 8.7* 8.0* 8.6*  HCT 30.5* 26.2* 27.4* 24.9* 26.5*  MCV 93.6 91.6 91.3 91.9 90.1  PLT 189 171 186 202 206   Basic Metabolic Panel: Recent Labs  Lab 04/03/24 1404 04/04/24 0420 04/04/24 1301 04/05/24 0609 04/06/24 0418 04/07/24 0230 04/08/24 0624  NA  --  139  --  138 139 139 138  K  --  4.5  --  4.9 4.5 4.0 3.7  CL  --  103  --  104 102 103 104  CO2  --  25  --  26 30 28 24   GLUCOSE  --  137*  --  137* 99 107* 106*  BUN  --  26*  --  24* 25* 27* 20  CREATININE  --  2.08* 1.91* 1.15* 0.94 0.98 0.84  CALCIUM   --  9.2  --  8.8* 9.3 8.9 9.3  MG 2.0  --   --   --   --   --   --   PHOS 3.9  --   --   --   --   --   --    Liver Function Tests: Recent Labs  Lab 04/04/24 0420  AST 24  ALT 15  ALKPHOS 37*  BILITOT 0.7  PROT 5.1*  ALBUMIN  3.1*   CBG: Gonzalez results for input(s): "GLUCAP" in the last 168 hours.  Discharge time spent: greater than 30 minutes.  Author: Charlean Congress, MD  [facility]

## 2024-04-09 NOTE — Plan of Care (Signed)
 Pt is discharged to CIR. PIV removed. Personal belongings returned to patient.    Problem: Education: Goal: Knowledge of General Education information will improve Description: Including pain rating scale, medication(s)/side effects and non-pharmacologic comfort measures 04/09/2024 1156 by Don Fritter, RN Outcome: Completed/Met 04/09/2024 1156 by Don Fritter, RN Outcome: Progressing   Problem: Health Behavior/Discharge Planning: Goal: Ability to manage health-related needs will improve 04/09/2024 1156 by Don Fritter, RN Outcome: Completed/Met 04/09/2024 1156 by Don Fritter, RN Outcome: Progressing   Problem: Clinical Measurements: Goal: Ability to maintain clinical measurements within normal limits will improve 04/09/2024 1156 by Don Fritter, RN Outcome: Completed/Met 04/09/2024 1156 by Don Fritter, RN Outcome: Progressing Goal: Will remain free from infection 04/09/2024 1156 by Don Fritter, RN Outcome: Completed/Met 04/09/2024 1156 by Don Fritter, RN Outcome: Progressing Goal: Diagnostic test results will improve 04/09/2024 1156 by Don Fritter, RN Outcome: Completed/Met 04/09/2024 1156 by Don Fritter, RN Outcome: Progressing Goal: Respiratory complications will improve 04/09/2024 1156 by Don Fritter, RN Outcome: Completed/Met 04/09/2024 1156 by Don Fritter, RN Outcome: Progressing Goal: Cardiovascular complication will be avoided 04/09/2024 1156 by Don Fritter, RN Outcome: Completed/Met 04/09/2024 1156 by Don Fritter, RN Outcome: Progressing   Problem: Activity: Goal: Risk for activity intolerance will decrease 04/09/2024 1156 by Don Fritter, RN Outcome: Completed/Met 04/09/2024 1156 by Don Fritter, RN Outcome: Progressing   Problem: Nutrition: Goal: Adequate nutrition will be maintained 04/09/2024 1156 by Don Fritter, RN Outcome: Completed/Met 04/09/2024 1156 by Don Fritter, RN Outcome: Progressing   Problem: Coping: Goal: Level of  anxiety will decrease 04/09/2024 1156 by Don Fritter, RN Outcome: Completed/Met 04/09/2024 1156 by Don Fritter, RN Outcome: Progressing   Problem: Elimination: Goal: Will not experience complications related to bowel motility 04/09/2024 1156 by Don Fritter, RN Outcome: Completed/Met 04/09/2024 1156 by Don Fritter, RN Outcome: Progressing Goal: Will not experience complications related to urinary retention 04/09/2024 1156 by Don Fritter, RN Outcome: Completed/Met 04/09/2024 1156 by Don Fritter, RN Outcome: Progressing   Problem: Pain Managment: Goal: General experience of comfort will improve and/or be controlled 04/09/2024 1156 by Don Fritter, RN Outcome: Completed/Met 04/09/2024 1156 by Don Fritter, RN Outcome: Progressing   Problem: Safety: Goal: Ability to remain free from injury will improve 04/09/2024 1156 by Don Fritter, RN Outcome: Completed/Met 04/09/2024 1156 by Don Fritter, RN Outcome: Progressing   Problem: Skin Integrity: Goal: Risk for impaired skin integrity will decrease 04/09/2024 1156 by Don Fritter, RN Outcome: Completed/Met 04/09/2024 1156 by Don Fritter, RN Outcome: Progressing   Problem: Education: Goal: Knowledge of disease or condition will improve 04/09/2024 1156 by Don Fritter, RN Outcome: Completed/Met 04/09/2024 1156 by Don Fritter, RN Outcome: Progressing Goal: Understanding of medication regimen will improve 04/09/2024 1156 by Don Fritter, RN Outcome: Completed/Met 04/09/2024 1156 by Don Fritter, RN Outcome: Progressing Goal: Individualized Educational Video(s) 04/09/2024 1156 by Don Fritter, RN Outcome: Completed/Met 04/09/2024 1156 by Don Fritter, RN Outcome: Progressing   Problem: Activity: Goal: Ability to tolerate increased activity will improve 04/09/2024 1156 by Don Fritter, RN Outcome: Completed/Met 04/09/2024 1156 by Don Fritter, RN Outcome: Progressing   Problem: Cardiac: Goal: Ability to  achieve and maintain adequate cardiopulmonary perfusion will improve 04/09/2024 1156 by Don Fritter, RN Outcome: Completed/Met 04/09/2024 1156 by Don Fritter, RN Outcome: Progressing   Problem: Health Behavior/Discharge  Planning: Goal: Ability to safely manage health-related needs after discharge will improve 04/09/2024 1156 by Don Fritter, RN Outcome: Completed/Met 04/09/2024 1156 by Don Fritter, RN Outcome: Progressing   Problem: Education: Goal: Verbalization of understanding the information provided (i.e., activity precautions, restrictions, etc) will improve 04/09/2024 1156 by Don Fritter, RN Outcome: Completed/Met 04/09/2024 1156 by Don Fritter, RN Outcome: Progressing Goal: Individualized Educational Video(s) 04/09/2024 1156 by Don Fritter, RN Outcome: Completed/Met 04/09/2024 1156 by Don Fritter, RN Outcome: Progressing   Problem: Activity: Goal: Ability to ambulate and perform ADLs will improve 04/09/2024 1156 by Don Fritter, RN Outcome: Completed/Met 04/09/2024 1156 by Don Fritter, RN Outcome: Progressing   Problem: Clinical Measurements: Goal: Postoperative complications will be avoided or minimized 04/09/2024 1156 by Don Fritter, RN Outcome: Completed/Met 04/09/2024 1156 by Don Fritter, RN Outcome: Progressing   Problem: Self-Concept: Goal: Ability to maintain and perform role responsibilities to the fullest extent possible will improve 04/09/2024 1156 by Don Fritter, RN Outcome: Completed/Met 04/09/2024 1156 by Don Fritter, RN Outcome: Progressing   Problem: Pain Management: Goal: Pain level will decrease 04/09/2024 1156 by Don Fritter, RN Outcome: Completed/Met 04/09/2024 1156 by Don Fritter, RN Outcome: Progressing

## 2024-04-09 NOTE — Progress Notes (Signed)
 Occupational Therapy Treatment Patient Details Name: [name] MRN: [id number] DOB: 10-Jun-1930 Today's Date: 04/09/2024   History of present illness Pt is a 89 y.o. female admitted 4/30 following a fall at home. Imaging revealed R femur fx. She underwent ORIF 5/1. PMH: normocytic anemia, OA, depression, fibromyalgia, GERD, skin cancer, hyperlipidemia, HTN, internal hemorrhoids, peripheral neuropathy, skin cancer, stress urinary incontinence, ulcerative colitis, h/o B THA/TKA   OT comments  Patient received in supine and eager to work with OT. Patient able to get to EOB with use of rail and min assist with assistance to scoot to EOB and with RLE. Patient able to perform grooming tasks seated on EOB with setup. Patient wanting to stand with Stedy to demonstrate to son and demonstrate progress. Patient able to stand from EOB into Stedy with max assist to power up and was able maintain NWB on RLE. Patient performed 2 more stands from stedy pads with mod assist before returning to EOB. Patient asked to remain on EOB at end of session. Patient will benefit from intensive inpatient follow-up therapy, >3 hours/day. Acute OT to continue to follow to address established goals to facilitate DC to next venue of care.       If plan is discharge home, recommend the following:  Two people to help with walking and/or transfers;A lot of help with bathing/dressing/bathroom;Assistance with cooking/housework;Assist for transportation;Help with stairs or ramp for entrance   Equipment Recommendations  Wheelchair cushion (measurements OT);Wheelchair (measurements OT);Other (comment);Tub/shower bench (drop arm commode)    Recommendations for Other Services      Precautions / Restrictions Precautions Precautions: Fall Recall of Precautions/Restrictions: Intact Restrictions Weight Bearing Restrictions Per Provider Order: Yes RLE Weight Bearing Per Provider Order: Non weight bearing Other Position/Activity  Restrictions: NWB per op note. TDWB per order.       Mobility Bed Mobility Overal bed mobility: Needs Assistance Bed Mobility: Supine to Sit     Supine to sit: Min assist, Used rails     General bed mobility comments: min assist with RLE and to scoot to EOB    Transfers Overall transfer level: Needs assistance Equipment used: Ambulation equipment used Transfers: Sit to/from Stand Sit to Stand: Max assist           General transfer comment: patient performed 3 stands, one from EOB and 2 from [place] pads with max assist from EOB and mod assist from pads with patient able to maintainWB precautions Transfer via Lift Equipment: Stedy   Balance Overall balance assessment: Needs assistance Sitting-balance support: Feet supported Sitting balance-Leahy Scale: Fair Sitting balance - Comments: patient left on EOB at end of session   Standing balance support: Bilateral upper extremity supported Standing balance-Leahy Scale: Poor Standing balance comment: reliant on Stedy and therapist for support to maintain WB precautions                           ADL either performed or assessed with clinical judgement   ADL Overall ADL's : Needs assistance/impaired     Grooming: Wash/dry hands;Wash/dry face;Oral care;Set up;Sitting Grooming Details (indicate cue type and reason): on EOB                                    Extremity/Trunk Assessment              Vision       Perception  Praxis     Communication Communication Communication: No apparent difficulties   Cognition Arousal: Alert Behavior During Therapy: WFL for tasks assessed/performed Cognition: No apparent impairments                               Following commands: Intact        Cueing   Cueing Techniques: Verbal cues, Tactile cues  Exercises      Shoulder Instructions       General Comments VSS on RA. son present during session    Pertinent Vitals/  Pain       Pain Assessment Pain Assessment: Faces Faces Pain Scale: Hurts little more Pain Location: R LE with movement Pain Descriptors / Indicators: Sore, Tender Pain Intervention(s): Monitored during session, Repositioned  Home Living                                          Prior Functioning/Environment              Frequency  Min 2X/week        Progress Toward Goals  OT Goals(current goals can now be found in the care plan section)  Progress towards OT goals: Progressing toward goals  Acute Rehab OT Goals OT Goal Formulation: With patient Time For Goal Achievement: 04/19/24 Potential to Achieve Goals: Good ADL Goals Pt Will Perform Grooming: with set-up;sitting Pt Will Perform Lower Body Bathing: with modified independence;sitting/lateral leans Pt Will Perform Lower Body Dressing: sitting/lateral leans;with set-up Pt Will Transfer to Toilet: with modified independence;with transfer board;bedside commode Pt Will Perform Toileting - Clothing Manipulation and hygiene: with modified independence;sit to/from stand Additional ADL Goal #1: Pt will complete bed mobility mod I.  Plan      Co-evaluation                 AM-PAC OT "6 Clicks" Daily Activity     Outcome Measure   Help from another person eating meals?: None Help from another person taking care of personal grooming?: A Little Help from another person toileting, which includes using toliet, bedpan, or urinal?: Total Help from another person bathing (including washing, rinsing, drying)?: A Lot Help from another person to put on and taking off regular upper body clothing?: A Little Help from another person to put on and taking off regular lower body clothing?: Total 6 Click Score: 14    End of Session Equipment Utilized During Treatment: Other (comment) Octaviano Belts)  OT Visit Diagnosis: Pain;Muscle weakness (generalized) (M62.81) Pain - Right/Left: Right Pain - part of body: Leg    Activity Tolerance Patient tolerated treatment well   Patient Left in bed;with call bell/phone within reach;with family/visitor present;Other (comment) (seated on EOB)   Nurse Communication Mobility status        Time: 1610-9604 OT Time Calculation (min): 27 min  Charges: OT General Charges $OT Visit: 1 Visit OT Treatments $Self Care/Home Management : 8-22 mins $Therapeutic Activity: 8-22 mins  Anitra Barn, OTA Acute Rehabilitation Services  Office [phone]   Jovita Nipper 04/09/2024, 11:58 AM

## 2024-04-09 NOTE — Progress Notes (Signed)
 Occupational Therapy Session Note  Patient Details  Name: [name] MRN: [id number] Date of Birth: 03-Sep-1930  {CHL IP REHAB OT TIME CALCULATIONS:[id number]}   Short Term Goals: Week 1:     Skilled Therapeutic Interventions/Progress Updates:    Patient agreeable to participate in OT session. Reports *** pain level.   Patient participated in skilled OT session focusing on ***. Therapist facilitated/assessed/developed/educated/integrated/elicited *** in order to improve/facilitate/promote    Therapy Documentation Precautions:  Restrictions Weight Bearing Restrictions Per Provider Order: Yes RLE Weight Bearing Per Provider Order: Touchdown weight bearing  Therapy/Group: Individual Therapy  Carollee Circle, OTR/L,CBIS  Supplemental OT - [facility] and [facility] Secure Chat Preferred   04/09/2024, 4:58 PM

## 2024-04-09 NOTE — Progress Notes (Signed)
 Jessica Coles, MD  Physician Physical Medicine and Rehabilitation   PMR Pre-admission    Signed   Date of Service: 04/06/2024  5:03 PM  Related encounter: ED to Hosp-Admission (Current) from 04/03/2024 in [facility]   Signed     Expand All Collapse All  Show:Clear all [x] Written[x] Templated[x] Copied  Added by: [x] Dorenda Gandy, RN[x] Alejo Amsler, Lauren Arlester Ladd, CCC-SLP[x] Jessica Coles, MD  [] Hover for details PMR Admission Coordinator Pre-Admission Assessment   Patient: [name] is an 89 y.o., female MRN: [id number] DOB: Sep 18, 1930 Height: 5\' 10"  (177.8 cm) Weight: 97.5 kg   Insurance Information HMO:     PPO:      PCP:      IPA:      80/20: yes     OTHER:  PRIMARY: Medicare Part A and B      Policy#: [id number]      Subscriber: patient CM Name:       Phone#:      Fax#:  Pre-Cert#:       Employer:  Benefits:  Phone #: verified eligibility via [employer] on 04/06/24     Name:  Eff. Date: Part A and B effective 06/04/97     Deduct: $1,676      Out of Pocket Max: NA      Life Max: NA CIR: 1005 coverage      SNF: 100% coverage for days 1-20, 80% coverage for days 21-100 Outpatient: 80% coverage     Co-Pay: 20% Home Health: 100% coverage      Co-Pay:  DME: 80% coverage     Co-Pay: 20% Providers: pt's choice SECONDARY: Scarlette Currier PPO      Policy#: [id number]     Phone#: [phone]   Financial Counselor:       Phone#:    The "Data Collection Information Summary" for patients in Inpatient Rehabilitation Facilities with attached "Privacy Act Statement-Health Care Records" was provided and verbally reviewed with: Patient   Emergency Contact Information Contact Information       Name Relation Home Work Mobile    [place] Son [phone]   [phone]    Marye Smoker [phone]   [phone]         Other Contacts   None on File        Current Medical History  Patient Admitting Diagnosis: Right femur fracture s/p ORIF    History of Present Illness: [name] is a 89 year old right-handed female with history significant for normocytic anemia, osteoarthritis with bilateral knee and bilateral hip replacement, obesity with BMI 30.85, depression, fibromyalgia, GERD/ulcerative colitis, skin cancer, hyperlipidemia, hypertension, internal hemorrhoids, peripheral neuropathy, stress urinary incontinence.  Used a rolling walker inside the home for mobility and electric scooter in the community.  Presented 04/03/2024 after mechanical fall when getting out of bed to answer the phone landing on her right hip.     X-rays and imaging revealed right supracondylar periprosthetic distal femur fracture below total hip arthroplasty.  Patient went into atrial fibrillation with RVR while in the ED.  Admission chemistries unremarkable except WBC 11,100, glucose 109, troponin negative.  The patient received diltiazem  15 mg IVP followed by continuous diltiazem  infusion for atrial fibrillation.  Echocardiogram with ejection fraction of 60 to 65% no wall motion abnormalities.  Patient was transition to p.o Lopressor  and case discussed with cardiology services plan to follow-up outpatient..  Patient was stabilized and underwent right open reduction internal fixation of right supracondylar femur fracture using Smith nephew plate  04/04/2024 per Dr. Guyann Leitz 04/04/2024.  Patient was cleared to begin Eliquis for both DVT prophylaxis and transient atrial fibrillation that they would remain on until follow-up with cardiology services.  Hospital course acute blood loss anemia 8.6 and monitored. Mild AKI 2.08 improved with fluids and lattest CRT 0.84 Therapy evaluations initiated patient TDWB right lower extremity.    Patient's medical record from [facility] has been reviewed by the rehabilitation admission coordinator and physician.   Past Medical History      Past Medical History:  Diagnosis Date   Anemia     Arthritis     Complication of  anesthesia      "I GOT PNEUMONIA THE NEXT DAY FROM THE ANESTHESIA"   Depression     Fibromyalgia     GERD (gastroesophageal reflux disease)     History of skin cancer     HLD (hyperlipidemia)     HTN (hypertension)     Internal hemorrhoids     Osteoarthritis     Peripheral neuropathy     Shortness of breath     Skin cancer     SUI (stress urinary incontinence, female)     Ulcerative colitis          Has the patient had major surgery during 100 days prior to admission? Yes   Family History   family history includes Colon cancer (age of onset: 9) in her mother; Diabetes in her brother.   Current Medications  Current Medications    Current Facility-Administered Medications:    acetaminophen  (TYLENOL ) tablet 650 mg, 650 mg, Oral, Q6H, 650 mg at 04/09/24 0540 **OR** acetaminophen  (TYLENOL ) suppository 650 mg, 650 mg, Rectal, Q6H, [name], PA-C   apixaban (ELIQUIS) tablet 5 mg, 5 mg, Oral, BID, Regalado, Belkys A, MD, 5 mg at 04/09/24 1029   bisacodyl  (DULCOLAX) EC tablet 10 mg, 10 mg, Oral, QHS, Patel, Pranav M, MD   bisacodyl  (DULCOLAX) suppository 10 mg, 10 mg, Rectal, Daily PRN, Versie Gores, PA-C   bisacodyl  (DULCOLAX) suppository 10 mg, 10 mg, Rectal, Once, Kraig Peru, MD   docusate sodium  (COLACE) capsule 100 mg, 100 mg, Oral, Daily, Danice Dural, MD, 100 mg at 04/09/24 1028   dorzolamide -timolol  (COSOPT ) 2-0.5 % ophthalmic solution 1 drop, 1 drop, Both Eyes, BID, Danice Dural, MD, 1 drop at 04/09/24 1029   feeding supplement (ENSURE ENLIVE / ENSURE PLUS) liquid 237 mL, 237 mL, Oral, BID BM, Deforest Fast, MD, 237 mL at 04/09/24 1027   ferrous sulfate  tablet 325 mg, 325 mg, Oral, Q breakfast, Regalado, Belkys A, MD, 325 mg at 04/09/24 1610   folic acid (FOLVITE) tablet 1 mg, 1 mg, Oral, Daily, Regalado, Belkys A, MD, 1 mg at 04/09/24 1029   latanoprost  (XALATAN ) 0.005 % ophthalmic solution 1 drop, 1 drop, Both Eyes, QHS, Danice Dural, MD, 1  drop at 04/08/24 2146   menthol -cetylpyridinium (CEPACOL) lozenge 3 mg, 1 lozenge, Oral, PRN **OR** phenol (CHLORASEPTIC) mouth spray 1 spray, 1 spray, Mouth/Throat, PRN, [name], PA-C   metoprolol  tartrate (LOPRESSOR ) injection 2.5 mg, 2.5 mg, Intravenous, Q5 min PRN, Danice Dural, MD   metoprolol  tartrate (LOPRESSOR ) tablet 25 mg, 25 mg, Oral, BID, Patel, Pranav M, MD, 25 mg at 04/09/24 1027   multivitamin with minerals tablet 1 tablet, 1 tablet, Oral, Daily, Deforest Fast, MD, 1 tablet at 04/09/24 1028   ondansetron  (ZOFRAN ) tablet 4 mg, 4 mg, Oral, Q6H PRN **OR** ondansetron  (ZOFRAN ) injection 4 mg, 4 mg,  Intravenous, Q6H PRN, Danice Dural, MD   oxyCODONE  (Oxy IR/ROXICODONE ) immediate release tablet 5 mg, 5 mg, Oral, Q6H PRN, Regalado, Belkys A, MD, 5 mg at 04/06/24 1011   pantoprazole  (PROTONIX ) EC tablet 40 mg, 40 mg, Oral, Daily, Danice Dural, MD, 40 mg at 04/09/24 1029   polyethylene glycol (MIRALAX  / GLYCOLAX ) packet 17 g, 17 g, Oral, BID, Regalado, Belkys A, MD, 17 g at 04/09/24 1027   pregabalin  (LYRICA ) capsule 25 mg, 25 mg, Oral, BID, Patel, Pranav M, MD, 25 mg at 04/09/24 1029   sertraline  (ZOLOFT ) tablet 50 mg, 50 mg, Oral, Daily, Danice Dural, MD, 50 mg at 04/09/24 1029   traZODone  (DESYREL ) tablet 150 mg, 150 mg, Oral, QHS, Danice Dural, MD, 150 mg at 04/08/24 2146     Patients Current Diet:  Diet Order                  Diet general             Diet regular Room service appropriate? Yes; Fluid consistency: Thin  Diet effective now                       Precautions / Restrictions Precautions Precautions: Fall Restrictions Weight Bearing Restrictions Per Provider Order: Yes RLE Weight Bearing Per Provider Order: Non weight bearing Other Position/Activity Restrictions: NWB per op note. TDWB per order.    Has the patient had 2 or more falls or a fall with injury in the past year? Yes   Prior Activity Level Community  (5-7x/wk): gets out of house ~4 days/week; drives   Prior Functional Level Self Care: Did the patient need help bathing, dressing, using the toilet or eating? Independent   Indoor Mobility: Did the patient need assistance with walking from room to room (with or without device)? Independent   Stairs: Did the patient need assistance with internal or external stairs (with or without device)? Independent   Functional Cognition: Did the patient need help planning regular tasks such as shopping or remembering to take medications? Independent   Patient Information Are you of Hispanic, Latino/a,or Spanish origin?: A. No, not of Hispanic, Latino/a, or Spanish origin What is your race?: A. White Do you need or want an interpreter to communicate with a doctor or health care staff?: 0. No   Patient's Response To:  Health Literacy and Transportation Is the patient able to respond to health literacy and transportation needs?: Yes Health Literacy - How often do you need to have someone help you when you read instructions, pamphlets, or other written material from your doctor or pharmacy?: Never In the past 12 months, has lack of transportation kept you from medical appointments or from getting medications?: No In the past 12 months, has lack of transportation kept you from meetings, work, or from getting things needed for daily living?: No   Journalist, newspaper / Equipment Home Equipment: Art gallery manager, Agricultural consultant (2 wheels), Rollator (4 wheels), Shower seat, Other (comment) (adjustable bed)   Prior Device Use: Indicate devices/aids used by the patient prior to current illness, exacerbation or injury? Walker and Education officer, environmental Level: Oriented X4    Extremity Assessment (includes Sensation/Coordination)   Upper Extremity Assessment: Right hand dominant, RUE deficits/detail, LUE deficits/detail RUE Deficits / Details: baseline  tremor RUE Coordination: decreased fine motor LUE Deficits / Details: baseline tremor LUE Coordination: decreased fine motor  Lower Extremity Assessment:  Defer to PT evaluation RLE Deficits / Details: s/p ORIF R femur     ADLs   Overall ADL's : Needs assistance/impaired Eating/Feeding: Independent, Sitting Eating/Feeding Details (indicate cue type and reason): increased spillage due to tremor Grooming: Set up, Sitting Upper Body Bathing: Minimal assistance, Sitting Lower Body Bathing: Total assistance, Bed level Upper Body Dressing : Set up, Sitting Lower Body Dressing: Total assistance, Bed level Toileting- Clothing Manipulation and Hygiene: Total assistance, Bed level Functional mobility during ADLs: Moderate assistance, +2 for safety/equipment (simulated lateral scoot at EOB)     Mobility   Overal bed mobility: Needs Assistance Bed Mobility: Supine to Sit Rolling: Min assist, Used rails Supine to sit: Min assist Sit to supine: Mod assist, Used rails General bed mobility comments: assist for R LE and to scoot hip     Transfers   Overall transfer level: Needs assistance Equipment used: Ambulation equipment used Transfers: Sit to/from Stand, Bed to chair/wheelchair/BSC Sit to Stand: Max assist, +2 physical assistance Bed to/from chair/wheelchair/BSC transfer type:: Via Lift equipment  Lateral/Scoot Transfers: Min assist, +2 safety/equipment Transfer via Lift Equipment: [employer] transfer comment: stood to [place] with mod A and cues for NWB R LE. seated on flaps to move to recliner and assist for standing from flaps and to remove for A to lower to recliner padded with pillows     Ambulation / Gait / Stairs / Wheelchair Mobility   Ambulation/Gait General Gait Details: unable     Posture / Balance Balance Overall balance assessment: Needs assistance Sitting-balance support: Feet supported Sitting balance-Leahy Scale: Fair Standing balance support: Bilateral upper  extremity supported Standing balance-Leahy Scale: Poor Standing balance comment: limited by NWB R LE     Special needs/care consideration Oxygen 2L nasal cannula, Bladder incontinence and Skin Surgical Incision: thigh/right; Abrasion: arm/right; Ecchymosis: arm, leg/bilateral    Previous Home Environment  Living Arrangements: Children  Lives With: Son (daughter-in-law) Available Help at Discharge: Family, Available PRN/intermittently Type of Home: House Home Layout: Two level, Other (Comment) (ramp to main level of house) Home Access: Level entry Bathroom Shower/Tub: Engineer, manufacturing systems: Standard Bathroom Accessibility: No Home Care Services: No   Discharge Living Setting Plans for Discharge Living Setting: Patient's home Type of Home at Discharge: House Discharge Home Layout: Two level, Other (Comment) (lives in in-law suite in basement) Discharge Home Access: Level entry Discharge Bathroom Shower/Tub: Tub/shower unit Discharge Bathroom Toilet: Standard Discharge Bathroom Accessibility: No Does the patient have any problems obtaining your medications?: No   Social/Family/Support Systems Anticipated Caregiver: Alejo Hurter, Glee Lana and other sons Anticipated Industrial/product designer Information: Matt: [phone], Skip: [phone] Caregiver Availability: 24/7 Discharge Plan Discussed with Primary Caregiver: Yes Is Caregiver In Agreement with Plan?: Yes Does Caregiver/Family have Issues with Lodging/Transportation while Pt is in Rehab?: No   Goals Patient/Family Goal for Rehab: Supervision-Min A:PT/OT Expected length of stay: 12-14 days Pt/Family Agrees to Admission and willing to participate: Yes Program Orientation Provided & Reviewed with Pt/Caregiver Including Roles  & Responsibilities: Yes   Decrease burden of Care through IP rehab admission: NA   Possible need for SNF placement upon discharge: Not anticipated   Patient Condition: I have reviewed  medical records [facility], spoken with CM, and patient and son. I met with patient at the bedside and discussed via phone for inpatient rehabilitation assessment.  Patient will benefit from ongoing PT and OT, can actively participate in 3 hours of therapy a day 5 days of the week, and can  make measurable gains during the admission.  Patient will also benefit from the coordinated team approach during an Inpatient Acute Rehabilitation admission.  The patient will receive intensive therapy as well as Rehabilitation physician, nursing, social worker, and care management interventions.  Due to bladder management, safety, skin/wound care, disease management, medication administration, pain management, and patient education the patient requires 24 hour a day rehabilitation nursing.  The patient is currently mod assist overall with mobility and basic ADLs.  Discharge setting and therapy post discharge at home with home health is anticipated.  Patient has agreed to participate in the Acute Inpatient Rehabilitation Program and will admit today.   Preadmission Screen Completed By:  Amiel Kalata, 04/09/2024 10:30 AM ______________________________________________________________________   Discussed status with Dr. Raynaldo Call on 04/09/24 at 1031 and received approval for admission today.   Admission Coordinator:  Amiel Kalata, CCC-SLP, time 1031 Date 04/09/24    Assessment/Plan: Diagnosis:  R periprosthetic hip fx s/p ORIF_ NWB for 4-6 weeks Does the need for close, 24 hr/day Medical supervision in concert with the patient's rehab needs make it unreasonable for this patient to be served in a less intensive setting? Yes Co-Morbidities requiring supervision/potential complications: Afib with RVR; AKI; fragility fx; FMS, Ulcerative colitis; peripherla neuropathy; hx of HTN/HLD; GERD Due to bladder management, bowel management, safety, skin/wound care, disease management, medication  administration, pain management, and patient education, does the patient require 24 hr/day rehab nursing? Yes Does the patient require coordinated care of a physician, rehab nurse, PT, OT, and SLP to address physical and functional deficits in the context of the above medical diagnosis(es)? Yes Addressing deficits in the following areas: balance, endurance, locomotion, strength, transferring, bowel/bladder control, bathing, dressing, feeding, grooming, and toileting Can the patient actively participate in an intensive therapy program of at least 3 hrs of therapy 5 days a week? Yes The potential for patient to make measurable gains while on inpatient rehab is good Anticipated functional outcomes upon discharge from inpatient rehab: supervision and min assist PT, supervision and min assist OT, n/a SLP Estimated rehab length of stay to reach the above functional goals is: 12-14 days Anticipated discharge destination: Home 10. Overall Rehab/Functional Prognosis: good     MD Signature:            Revision History

## 2024-04-10 DIAGNOSIS — D62 Acute posthemorrhagic anemia: Secondary | ICD-10-CM

## 2024-04-10 DIAGNOSIS — S7291XD Unspecified fracture of right femur, subsequent encounter for closed fracture with routine healing: Secondary | ICD-10-CM

## 2024-04-10 DIAGNOSIS — M25551 Pain in right hip: Secondary | ICD-10-CM

## 2024-04-10 DIAGNOSIS — K5900 Constipation, unspecified: Secondary | ICD-10-CM | POA: Diagnosis not present

## 2024-04-10 DIAGNOSIS — N179 Acute kidney failure, unspecified: Secondary | ICD-10-CM | POA: Diagnosis not present

## 2024-04-10 LAB — CBC WITH DIFFERENTIAL/PLATELET
Abs Immature Granulocytes: 0.04 10*3/uL (ref 0.00–0.07)
Basophils Absolute: 0 10*3/uL (ref 0.0–0.1)
Basophils Relative: 0 %
Eosinophils Absolute: 0.6 10*3/uL — ABNORMAL HIGH (ref 0.0–0.5)
Eosinophils Relative: 7 %
HCT: 26.1 % — ABNORMAL LOW (ref 36.0–46.0)
Hemoglobin: 8.4 g/dL — ABNORMAL LOW (ref 12.0–15.0)
Immature Granulocytes: 1 %
Lymphocytes Relative: 25 %
Lymphs Abs: 2.2 10*3/uL (ref 0.7–4.0)
MCH: 29 pg (ref 26.0–34.0)
MCHC: 32.2 g/dL (ref 30.0–36.0)
MCV: 90 fL (ref 80.0–100.0)
Monocytes Absolute: 0.6 10*3/uL (ref 0.1–1.0)
Monocytes Relative: 7 %
Neutro Abs: 5.4 10*3/uL (ref 1.7–7.7)
Neutrophils Relative %: 60 %
Platelets: 268 10*3/uL (ref 150–400)
RBC: 2.9 MIL/uL — ABNORMAL LOW (ref 3.87–5.11)
RDW: 14.5 % (ref 11.5–15.5)
WBC: 8.8 10*3/uL (ref 4.0–10.5)
nRBC: 0 % (ref 0.0–0.2)

## 2024-04-10 LAB — COMPREHENSIVE METABOLIC PANEL WITH GFR
ALT: 15 U/L (ref 0–44)
AST: 26 U/L (ref 15–41)
Albumin: 2.5 g/dL — ABNORMAL LOW (ref 3.5–5.0)
Alkaline Phosphatase: 38 U/L (ref 38–126)
Anion gap: 10 (ref 5–15)
BUN: 19 mg/dL (ref 8–23)
CO2: 22 mmol/L (ref 22–32)
Calcium: 9.2 mg/dL (ref 8.9–10.3)
Chloride: 104 mmol/L (ref 98–111)
Creatinine, Ser: 0.87 mg/dL (ref 0.44–1.00)
GFR, Estimated: >60 mL/min (ref >60)
Glucose, Bld: 105 mg/dL — ABNORMAL HIGH (ref 70–99)
Potassium: 3.5 mmol/L (ref 3.5–5.1)
Sodium: 136 mmol/L (ref 135–145)
Total Bilirubin: 1.1 mg/dL (ref 0.0–1.2)
Total Protein: 5 g/dL — ABNORMAL LOW (ref 6.5–8.1)

## 2024-04-10 NOTE — Progress Notes (Signed)
 PROGRESS NOTE   Subjective/Complaints:  Patient reports constipation improved with bowel movement today.  Reports bladder function at baseline.  Pain controlled with current medications.  She is a little more sore after therapy session but has not had meds yet.   ROS: Patient denies fever, new vision changes, dizziness, nausea, vomiting, diarrhea,  shortness of breath or chest pain, headache, or mood change. + Chronic stress incontinence.  Objective:   VAS US  LOWER EXTREMITY VENOUS (DVT) Result Date: 04/09/2024  Lower Venous DVT Study Patient Name:  [name]  Date of Exam:   04/08/2024 Medical Rec #: [id number]         Accession #:    [id number] Date of Birth: 08-10-1930         Patient Gender: F Patient Age:   89 years Exam Location:  [facility] Procedure:      VAS US  LOWER EXTREMITY VENOUS (DVT) Referring Phys: PRANAV PATEL --------------------------------------------------------------------------------  Indications: Edema.  Risk Factors: Cancer skin Surgery Open reduction RT femur fracture 04/04/24 past pregnancy and obesity. Comparison Study: None. Performing Technologist: Estanislao Heimlich  Examination Guidelines: A complete evaluation includes B-mode imaging, spectral Doppler, color Doppler, and power Doppler as needed of all accessible portions of each vessel. Bilateral testing is considered an integral part of a complete examination. Limited examinations for reoccurring indications may be performed as noted. The reflux portion of the exam is performed with the patient in reverse Trendelenburg.  +---------+---------------+---------+-----------+----------+--------------+ RIGHT    CompressibilityPhasicitySpontaneityPropertiesThrombus Aging +---------+---------------+---------+-----------+----------+--------------+ CFV      Full           Yes      Yes                                  +---------+---------------+---------+-----------+----------+--------------+ SFJ      Full                                                        +---------+---------------+---------+-----------+----------+--------------+ FV Prox  Full                                                        +---------+---------------+---------+-----------+----------+--------------+ FV Mid   Full                                                        +---------+---------------+---------+-----------+----------+--------------+ FV DistalFull                                                        +---------+---------------+---------+-----------+----------+--------------+  PFV      Full                                                        +---------+---------------+---------+-----------+----------+--------------+ POP      Full           Yes      Yes                                 +---------+---------------+---------+-----------+----------+--------------+ PTV      Full                    Yes                                 +---------+---------------+---------+-----------+----------+--------------+ PERO     Full                    Yes                                 +---------+---------------+---------+-----------+----------+--------------+   +----+---------------+---------+-----------+----------+--------------+ LEFTCompressibilityPhasicitySpontaneityPropertiesThrombus Aging +----+---------------+---------+-----------+----------+--------------+ CFV Full           Yes      Yes                                 +----+---------------+---------+-----------+----------+--------------+     Summary: RIGHT: - There is no evidence of deep vein thrombosis in the lower extremity.  - No cystic structure found in the popliteal fossa.  LEFT: - No evidence of common femoral vein obstruction.   *See table(s) above for measurements and observations. Electronically signed by Delaney Fearing on  04/09/2024 at 2:56:44 PM.    Final    Recent Labs    04/08/24 0624 04/10/24 0511  WBC 9.1 8.8  HGB 8.6* 8.4*  HCT 26.5* 26.1*  PLT 206 268   Recent Labs    04/08/24 0624 04/10/24 0511  NA 138 136  K 3.7 3.5  CL 104 104  CO2 24 22  GLUCOSE 106* 105*  BUN 20 19  CREATININE 0.84 0.87  CALCIUM  9.3 9.2    Intake/Output Summary (Last 24 hours) at 04/10/2024 1206 Last data filed at 04/10/2024 0755 Gross per 24 hour  Intake 1119.67 ml  Output 1075 ml  Net 44.67 ml        Physical Exam: Vital Signs Blood pressure (!) 143/88, pulse 93, temperature 98.7 F (37.1 C), temperature source Oral, resp. rate 16, height 5\' 10"  (1.778 m), weight 109.1 kg, SpO2 94%.  General: No apparent distress, well-dressed laying in bed.  Appears younger than stated age HEENT: Head is normocephalic, atraumatic, sclera anicteric, oral mucosa pink and moist Neck: Supple without JVD or lymphadenopathy Heart: Reg rate and rhythm.   Chest: CTA bilaterally without wheezes, rales, or rhonchi; no distress Abdomen: Soft, non-tender, non-distended, bowel sounds positive. Extremities: Trace bilateral lower extremity swelling.  Right hip incision with dressing CDI, appropriately tender. Psych: Pt's affect is appropriate. Pt is cooperative Skin: Clean and intact without signs of breakdown Neuro:    Mental Status: AAOx3 Speech/Languate: fluent CRANIAL  NERVES: Grossly intact  Patient is alert makes eye contact with examiner.  Follows full commands. Decreased to light touch in hands B/L- not rest of Ue's- B/L as well as L1 to S2 B/L- worse in LE's- more sensory deficits  Ue's 5-/5 LLE 5-/5 throughout RLE_ HF 2/5; KE 5-/5 and DF/PF 5-/5- limited by pain when testing MSK strength   MSK: R hip moderate swelling    Assessment/Plan: 1. Functional deficits which require 3+ hours per day of interdisciplinary therapy in a comprehensive inpatient rehab setting. Physiatrist is providing close team supervision and  24 hour management of active medical problems listed below. Physiatrist and rehab team continue to assess barriers to discharge/monitor patient progress toward functional and medical goals  Care Tool:  Bathing              Bathing assist       Upper Body Dressing/Undressing Upper body dressing        Upper body assist      Lower Body Dressing/Undressing Lower body dressing            Lower body assist       Toileting Toileting    Toileting assist       Transfers Chair/bed transfer  Transfers assist     Chair/bed transfer assist level: Dependent - mechanical lift     Locomotion Ambulation   Ambulation assist              Walk 10 feet activity   Assist           Walk 50 feet activity   Assist           Walk 150 feet activity   Assist           Walk 10 feet on uneven surface  activity   Assist           Wheelchair     Assist               Wheelchair 50 feet with 2 turns activity    Assist            Wheelchair 150 feet activity     Assist          Blood pressure (!) 143/88, pulse 93, temperature 98.7 F (37.1 C), temperature source Oral, resp. rate 16, height 5\' 10"  (1.778 m), weight 109.1 kg, SpO2 94%.   Medical Problem List and Plan: 1. Functional deficits secondary to right supracondylar periprosthetic distal femur fracture.  Status post open reduction internal fixation 04/04/2024 per Dr. Guyann Leitz. TDWB right lower extremity for 4-6 weeks             -patient may  shower             -ELOS/Goals: 12-14 days Supervision to min A             Continue CIR  -Team conference today please see physician documentation under team conference tab, met with team  to discuss problems,progress, and goals. Formulized individual treatment plan based on medical history, underlying problem and comorbidities. ,  2.  Antithrombotics: -DVT/anticoagulation:  Pharmaceutical: Eliquis.  Venous Doppler study  negative.             -antiplatelet therapy: N/A 3. Pain Management: Lyrica  25 mg BID,, oxycodone  as needed- more pain from peripheral neuropathy than her RLE- mainly L ankle per pt  -5/7 patient reports pain is overall controlled, continue current regimen and monitor 4. Mood/Behavior/Sleep: Zoloft  50 mg  daily, trazodone  150 mg nightly.  Provide emotional support             -antipsychotic agents: N/A 5. Neuropsych/cognition: This patient is capable of making decisions on her own behalf. 6. Skin/Wound Care: Routine skin checks 7. Fluids/Electrolytes/Nutrition: Routine in and outs with follow-up chemistries 8.  Acute blood loss anemia as well as history of normocytic anemia.  Continue iron supplement.  Follow-up CBC  - 5/7 HGB stable 8.4 9.  Transient atrial fibrillation with RVR Lopressor  25 mg twice daily.  Follow-up cardiology services.  Continue Eliquis until further notice.. 10.  Obesity.  BMI 30.85.  Dietary follow-up 11.  Constipation.  MiraLAX  twice daily, Senokot daily, Colace 100 mg daily.  -5/7 improved after bowel movement today 12.AKI.Resolved.Follow up chemestries  5/7 BUN/Cr stable, continue to monitor    LOS: 1 days A FACE TO FACE EVALUATION WAS PERFORMED  [name] 04/10/2024, 12:06 PM

## 2024-04-10 NOTE — Evaluation (Signed)
 Physical Therapy Assessment and Plan  Patient Details  Name: [name] MRN: [id number] Date of Birth: Nov 22, 1930  PT Diagnosis: Difficulty walking, Edema, Impaired sensation, and Muscle weakness Rehab Potential: Fair ELOS: 12-14 days   Today's Date: 04/10/2024 PT Individual Time: 1100-1157 PT Individual Time Calculation (min): 57 min    Hospital Problem: Principal Problem:   Femur fracture (HCC)   Past Medical History:  Past Medical History:  Diagnosis Date   Anemia    Arthritis    Complication of anesthesia    "I GOT PNEUMONIA THE NEXT DAY FROM THE ANESTHESIA"   Depression    Fibromyalgia    GERD (gastroesophageal reflux disease)    History of skin cancer    HLD (hyperlipidemia)    HTN (hypertension)    Internal hemorrhoids    Osteoarthritis    Peripheral neuropathy    Shortness of breath    Skin cancer    SUI (stress urinary incontinence, female)    Ulcerative colitis    Past Surgical History:  Past Surgical History:  Procedure Laterality Date   ABDOMINAL HYSTERECTOMY     APPENDECTOMY     BREAST EXCISIONAL BIOPSY Left 08/10/2011    Dr [name]   BREAST SURGERY     CARPAL TUNNEL RELEASE Left    CESAREAN SECTION     CHOLECYSTECTOMY     COLONOSCOPY  04/29/2003   internal hemorrhoids   JOINT REPLACEMENT Bilateral 2010 and 2012   KNEES   ORIF FEMUR FRACTURE Right 04/04/2024   Procedure: OPEN REDUCTION INTERNAL FIXATION RIGHT FEMUR FRACTURE;  Surgeon: Jessica Lia, MD;  Location: [facility] OR;  Service: Orthopedics;  Laterality: Right;   TOTAL HIP ARTHROPLASTY Right 08/06/2014   Procedure: RIGHT TOTAL HIP ARTHROPLASTY ANTERIOR APPROACH;  Surgeon: Jessica Blue, MD;  Location: [facility] ORS;  Service: Orthopedics;  Laterality: Right;   TOTAL HIP ARTHROPLASTY Left 12/17/2014   Procedure: LEFT TOTAL HIP ARTHROPLASTY ANTERIOR APPROACH;  Surgeon: Jessica Blue, MD;  Location: [facility] ORS;  Service: Orthopedics;  Laterality: Left;    Assessment & Plan Clinical Impression: Patient  is a 89 y.o. year old female with history significant for normocytic anemia, osteoarthritis with bilateral knee and bilateral hip replacement, obesity with BMI 30.85, depression, fibromyalgia, GERD/ulcerative colitis, skin cancer, hyperlipidemia, hypertension, internal hemorrhoids, peripheral neuropathy, stress urinary incontinence. Per chart review patient lives with son. Two-level home with ramp to main entrance. Used a rolling walker inside the home for mobility and electric scooter in the community. Presented 04/03/2024 after mechanical fall when getting out of bed to answer the phone landing on her right hip. X-rays and imaging revealed right supracondylar periprosthetic distal femur fracture below total hip arthroplasty. Patient went into atrial fibrillation with RVR while in the ED. Admission chemistries unremarkable except WBC 11,100, glucose 109, troponin negative. The patient received diltiazem  15 mg IVP followed by continuous diltiazem  infusion for atrial fibrillation. Echocardiogram with ejection fraction of 60 to 65% no wall motion abnormalities. Patient was transition to p.o Lopressor  and case discussed with cardiology services plan to follow-up outpatient.. Patient was stabilized and underwent right open reduction internal fixation of right supracondylar femur fracture using Smith nephew plate 4/0/9811 per Dr. Guyann Gonzalez 04/04/2024. Patient was cleared to begin Eliquis for both DVT prophylaxis and transient atrial fibrillation that they would remain on until follow-up with cardiology services. Hospital course acute blood loss anemia 8.6 and monitored. Mild AKI 2.08 improved with fluids and lattest CRT 0.84 Therapy evaluations initiated patient TDWB right lower extremity. Therapy evaluations  completed with recommendations of inpatient rehab services due to patient's decreased functional mobility   Patient currently requires mod with mobility secondary to muscle weakness and muscle joint tightness, decreased  cardiorespiratoy endurance, decreased coordination, and decreased sitting balance, decreased standing balance, decreased balance strategies, and difficulty maintaining precautions.  Prior to hospitalization, patient was modified independent  with mobility and lived with Son (pt lives with son [name] and daughter in Social worker) in a House home.  Home access is  Other (comment) (incline entrance to inlaw suite).  Patient will benefit from skilled PT intervention to maximize safe functional mobility, minimize fall risk, and decrease caregiver burden for planned discharge home with 24 hour supervision/assistance .  Anticipate patient will benefit from follow up HH at discharge.  PT - End of Session Endurance Deficit: Yes   PT Evaluation Precautions/Restrictions Precautions Precautions: Fall Recall of Precautions/Restrictions: Impaired Restrictions Weight Bearing Restrictions Per Provider Order: Yes RLE Weight Bearing Per Provider Order: Touchdown weight bearing Pain Interference Pain Interference Pain Effect on Sleep: 4. Almost constantly Pain Interference with Therapy Activities: 1. Rarely or not at all Pain Interference with Day-to-Day Activities: 1. Rarely or not at all Home Living/Prior Functioning Home Living Available Help at Discharge: Family;Available PRN/intermittently Type of Home: House Home Access: Other (comment) (incline entrance to inlaw suite) Home Layout: Other (Comment) (lives in basement in in law suite, incline entrance) Bathroom Shower/Tub: Other (comment) (has a walk in tub with built in seat) Bathroom Toilet: Standard Bathroom Accessibility: No Additional Comments: home measurement sheet provided  Lives With: Son (pt lives with son [name] and daughter in Social worker) Prior Function Level of Independence: Requires assistive device for independence;Independent with transfers;Independent with basic ADLs  Able to Take Stairs?: No Driving: Yes Vocation: Retired Patent attorney - History Ability to See in Adequate Light: 0 Adequate Perception Perception: Within Functional Limits Praxis Praxis: WFL  Cognition Overall Cognitive Status: Within Functional Limits for tasks assessed Arousal/Alertness: Awake/alert Orientation Level: Oriented X4 Memory: Appears intact Awareness: Appears intact Problem Solving: Impaired Safety/Judgment: Impaired Sensation Sensation Light Touch: Impaired Detail Hot/Cold: Appears Intact Proprioception: Appears Intact Stereognosis: Appears Intact Additional Comments: Reports baseline severe neuropathy in BUE and B LE Coordination Gross Motor Movements are Fluid and Coordinated: No Fine Motor Movements are Fluid and Coordinated: Yes Coordination and Movement Description: grossly limited by pain and weakness Finger Nose Finger Test: NT Heel Shin Test: grossly limited by pain and weakness 9 Hole Peg Test: NT Motor  Motor Motor: Other (comment) Motor - Skilled Clinical Observations: generalized weakness and pain post fall/R LE supracondylar fracture  Trunk/Postural Assessment  Cervical Assessment Cervical Assessment: Within Functional Limits Thoracic Assessment Thoracic Assessment: Exceptions to [facility] (rounded shoulders) Lumbar Assessment Lumbar Assessment: Exceptions to [facility] (posterior pelvic tilt) Postural Control Postural Control: Deficits on evaluation  Balance Balance Balance Assessed: Yes Static Sitting Balance Static Sitting - Balance Support: No upper extremity supported;Feet supported Static Sitting - Level of Assistance: 5: Stand by assistance Dynamic Sitting Balance Dynamic Sitting - Balance Support: No upper extremity supported;During functional activity Dynamic Sitting - Level of Assistance: 5: Stand by assistance Static Standing Balance Static Standing - Balance Support: Bilateral upper extremity supported;During functional activity Static Standing - Level of Assistance: 2: Max assist Extremity  Assessment  RUE Assessment RUE Assessment: Within Functional Limits LUE Assessment LUE Assessment: Within Functional Limits RLE Assessment RLE Assessment: Exceptions to [facility] General Strength Comments: grossly 2/5--limited by pain and weakness LLE Assessment LLE Assessment: Within Functional Limits  Care Tool Care  Tool Bed Mobility Roll left and right activity   Roll left and right assist level: Moderate Assistance - Patient 50 - 74%    Sit to lying activity   Sit to lying assist level: Moderate Assistance - Patient 50 - 74%    Lying to sitting on side of bed activity   Lying to sitting on side of bed assist level: the ability to move from lying on the back to sitting on the side of the bed with no back support.: Minimal Assistance - Patient > 75%     Care Tool Transfers Sit to stand transfer   Sit to stand assist level: Moderate Assistance - Patient 50 - 74%    Chair/bed transfer   Chair/bed transfer assist level: Dependent - mechanical lift    Car transfer    Safety/Medical       Care Tool Locomotion Ambulation      Safety/Medical     Walk 10 feet activity    Safety/Medical      Walk 50 feet with 2 turns activity    Safety/Medical     Walk 150 feet activity    Safety/Medical     Walk 10 feet on uneven surfaces activity    Safety/Medical     Stairs    Safety/Medical       Walk up/down 1 step activity    Safety/Medical     Walk up/down 4 steps activity    Safety/Medical     Walk up/down 12 steps activity    Safety/Medical     Pick up small objects from floor    Total A     Wheelchair      Safety/Medical       Wheel 50 feet with 2 turns activity  Safety/Medical     Wheel 150 feet activity  Safety/Medical       Refer to Care Plan for Long Term Goals  SHORT TERM GOAL WEEK 1 PT Short Term Goal 1 (Week 1): pt will perform bed mobility with min A or less with LRAD PT Short Term Goal 2 (Week 1): pt will perform sit to stand with LRAD and min  A PT Short Term Goal 3 (Week 1): pt will perform bed to chiar transfer with LRAD and mod A or less PT Short Term Goal 4 (Week 1): pt will initiate gait training with LRAD  Recommendations for other services: Neuropsych and Therapeutic Recreation  Pet therapy, Kitchen group, and Stress management  Skilled Therapeutic Intervention Mobility Bed Mobility Bed Mobility: Sit to Supine Supine to Sit: Moderate Assistance - Patient 50-74% Sitting - Scoot to Edge of Bed: Supervision/Verbal cueing Transfers Transfers: Sit to Stand;Stand to Sit;Transfer via Lift Equipment Sit to Stand: Moderate Assistance - Patient 50-74% Stand to Sit: Moderate Assistance - Patient 50-74% Transfer (Assistive device): Rolling walker Transfer via Lift Equipment: Youth worker Ambulation: No Gait Gait: No Stairs / Additional Locomotion Stairs: No Corporate treasurer: Yes Wheelchair Assistance: Dependent - Patient 0%   Discharge Criteria: Patient will be discharged from PT if patient refuses treatment 3 consecutive times without medical reason, if treatment goals not met, if there is a change in medical status, if patient makes no progress towards goals or if patient is discharged from hospital.  The above assessment, treatment plan, treatment alternatives and goals were discussed and mutually agreed upon: by patient  Today's Interventions  Evaluation completed (see details above and below) with education on PT POC and goals and individual treatment  initiated with focus on implementation of R LE TDWBing precautions.   Pt son present at start of session. Pt and pt son confirms pt electric scooter does not fit in the house, but they use it to leave pt in law suite in basement to go up inclined paved driveway to get in the car and to navigate the community.   Pt reports at PLOF using RW/furniture walking with multiple falls. Pt reports some falls occurred when ambulating without use  of RW, however this fall occurred when pt standing and reaching for phone with unilateral UE on RW.   Therapist provided pt and pt son with home measurement sheet.  Pt performed sit to stand with mod A x2 throughout session with RW, max vebral and tactile cues provided for maintenance of R LE TDWBing precautions. Pt reports exhaustion and pain from previous OT session.   Attempted ambulation however discontinued for pt safety and maintenance of precautions.   Pt requesting to return to bed. Pt performed steady transfer with +1 mod A. Sit ot supine with mod A for management of trunk and R LE.   Pt supine in bed with all needs within reach and bed alarm on.      [facility] [place], [place], DPT  04/10/2024, 11:24 AM

## 2024-04-10 NOTE — Plan of Care (Signed)
  Problem: RH Balance Goal: LTG Patient will maintain dynamic sitting balance (PT) Description: LTG:  Patient will maintain dynamic sitting balance with assistance during mobility activities (PT) Flowsheets (Taken 04/10/2024 1613) LTG: Pt will maintain dynamic sitting balance during mobility activities with:: Supervision/Verbal cueing Goal: LTG Patient will maintain dynamic standing balance (PT) Description: LTG:  Patient will maintain dynamic standing balance with assistance during mobility activities (PT) Flowsheets (Taken 04/10/2024 1613) LTG: Pt will maintain dynamic standing balance during mobility activities with:: Minimal Assistance - Patient > 75%   Problem: Sit to Stand Goal: LTG:  Patient will perform sit to stand with assistance level (PT) Description: LTG:  Patient will perform sit to stand with assistance level (PT) Flowsheets (Taken 04/10/2024 1613) LTG: PT will perform sit to stand in preparation for functional mobility with assistance level: Supervision/Verbal cueing   Problem: RH Bed to Chair Transfers Goal: LTG Patient will perform bed/chair transfers w/assist (PT) Description: LTG: Patient will perform bed to chair transfers with assistance (PT). Flowsheets (Taken 04/10/2024 1613) LTG: Pt will perform Bed to Chair Transfers with assistance level: Supervision/Verbal cueing   Problem: RH Car Transfers Goal: LTG Patient will perform car transfers with assist (PT) Description: LTG: Patient will perform car transfers with assistance (PT). Flowsheets (Taken 04/10/2024 1613) LTG: Pt will perform car transfers with assist:: Minimal Assistance - Patient > 75%   Problem: RH Ambulation Goal: LTG Patient will ambulate in controlled environment (PT) Description: LTG: Patient will ambulate in a controlled environment, # of feet with assistance (PT). Flowsheets (Taken 04/10/2024 1613) LTG: Pt will ambulate in controlled environ  assist needed:: Supervision/Verbal cueing LTG: Ambulation  distance in controlled environment: 50 feet with LRAD Goal: LTG Patient will ambulate in home environment (PT) Description: LTG: Patient will ambulate in home environment, # of feet with assistance (PT). Flowsheets (Taken 04/10/2024 1613) LTG: Pt will ambulate in home environ  assist needed:: Supervision/Verbal cueing LTG: Ambulation distance in home environment: 25 feet with LRAD

## 2024-04-10 NOTE — Progress Notes (Signed)
 Inpatient Rehabilitation  Patient information reviewed and entered into eRehab system by [facility]. Karen Kays., CCC/SLP, PPS Coordinator.  Information including medical coding, functional ability and quality indicators will be reviewed and updated through discharge.

## 2024-04-10 NOTE — Patient Care Conference (Signed)
 Inpatient RehabilitationTeam Conference and Plan of Care Update Date: 04/10/2024   Time:12:04 PM    Patient Name: [name]      Medical Record Number: [id number]  Date of Birth: 04-23-1930 Sex: Female         Room/Bed: 4M11C/4M11C-01 Payor Info: Payor: MEDICARE / Plan: MEDICARE PART A AND B / Product Type: *No Product type* /    Admit Date/Time:  04/09/2024  2:20 PM  Primary Diagnosis:  Femur fracture [facility])  Hospital Problems: Principal Problem:   Femur fracture [facility])    Expected Discharge Date: Expected Discharge Date:  (evals pending)  Team Members Present: Physician leading conference: Dr. Lylia Sand Social Worker Present: Adrianna Albee, LCSW Nurse Present: Forrestine Ike, RN PT Present: Jenney Modest, PT OT Present: Carollee Circle, OT PPS Coordinator present : Jestine Moron, SLP     Current Status/Progress Goal Weekly Team Focus  Bowel/Bladder   Patient is continent of bowel and bladder. Her last BM was 04/09/24.            Swallow/Nutrition/ Hydration               ADL's   UB SBA, Dep LB, Stedy for transfer, Mod A sit to stand   SBA-Min A   sit to stand, functional transfers with use of RW, pain management    Mobility   bed mobility mod A, sit to stand mod A, steady transfer 2/2 pain, fatigue, legs shaking, unsafe to perform ambulation   sup/min A  12-14 days, HHPT, barriers to D/C: pt only available intermittently, pt and son discussing nursing home    Communication                Safety/Cognition/ Behavioral Observations               Pain   Patient reports pain is well controlled with tylenol . She does have PRN oxycodone  if needed.      Pain < 4 with prns    Assess pain q shift and effectiveness of medication and prn meds  Skin   Surgical site to right leg free from complications.     Incision healing   Monitor skin q shift     Discharge Planning:  New eval home with son whom she lives with will need to confirm care able  to be provided by son. Pt was mod/i PTA neuropathy biggest issue   Team Discussion: Patient post fall with femur fracture.  Progress limited by constipation and anemia.  Patient on target to meet rehab goals: yes, currently needs supervision for upper body care and dependent for lower body care. Using a stedy for transfers and needs cues to maintain TD Weight Bearing precautions with mod assist.,  Goals for discharge set for supervision - min assist overall.  *See Care Plan and progress notes for long and short-term goals.   Revisions to Treatment Plan:  N/a   Teaching Needs: Safety, medications, transfers, toileting, etc.   Current Barriers to Discharge: Decreased caregiver support, Home enviroment access/layout, and Weight bearing restrictions  Possible Resolutions to Barriers: Family education HH follow up services     Medical Summary Current Status: right supracondylar periprosthetic distal femur fracture, constipation, AI, AFib, anemia  Barriers to Discharge: Incontinence;Medical stability;Self-care education;Weight bearing restrictions  Barriers to Discharge Comments: right supracondylar periprosthetic distal femur fracture, constipation, AI, AFib, anemia Possible Resolutions to [employer] Focus: monitor bowel function, monitor bmp, CBC, continue lyrica  and oxycodone    Continued Need for Acute Rehabilitation Level  of Care: The patient requires daily medical management by a physician with specialized training in physical medicine and rehabilitation for the following reasons: Direction of a multidisciplinary physical rehabilitation program to maximize functional independence : Yes Medical management of patient stability for increased activity during participation in an intensive rehabilitation regime.: Yes Analysis of laboratory values and/or radiology reports with any subsequent need for medication adjustment and/or medical intervention. : Yes   I attest that I was  present, lead the team conference, and concur with the assessment and plan of the team.   Forrestine Ike B 04/10/2024, 3:12 PM

## 2024-04-10 NOTE — Progress Notes (Signed)
 Patient requested PRN tylenol  twice this shift for right leg pain. It was effective both times. She also requested an ice pack to be place at surgical site. She slept well over night. She remained calm and pleasant throughout the shift. No immediate concerns. She is educated on therapy schedule and unit schedule. No concerns at this time.

## 2024-04-10 NOTE — Progress Notes (Signed)
 Inpatient Rehabilitation Care Coordinator Assessment and Plan Patient Details  Name: [name] MRN: [id number] Date of Birth: 05/12/1930  Today's Date: 04/10/2024  Hospital Problems: Principal Problem:   Femur fracture [facility])  Past Medical History:  Past Medical History:  Diagnosis Date   Anemia    Arthritis    Complication of anesthesia    "I GOT PNEUMONIA THE NEXT DAY FROM THE ANESTHESIA"   Depression    Fibromyalgia    GERD (gastroesophageal reflux disease)    History of skin cancer    HLD (hyperlipidemia)    HTN (hypertension)    Internal hemorrhoids    Osteoarthritis    Peripheral neuropathy    Shortness of breath    Skin cancer    SUI (stress urinary incontinence, female)    Ulcerative colitis    Past Surgical History:  Past Surgical History:  Procedure Laterality Date   ABDOMINAL HYSTERECTOMY     APPENDECTOMY     BREAST EXCISIONAL BIOPSY Left 08/10/2011    Dr Linell Rhymes   BREAST SURGERY     CARPAL TUNNEL RELEASE Left    CESAREAN SECTION     CHOLECYSTECTOMY     COLONOSCOPY  04/29/2003   internal hemorrhoids   JOINT REPLACEMENT Bilateral 2010 and 2012   KNEES   ORIF FEMUR FRACTURE Right 04/04/2024   Procedure: OPEN REDUCTION INTERNAL FIXATION RIGHT FEMUR FRACTURE;  Surgeon: Hardy Lia, MD;  Location: [facility] OR;  Service: Orthopedics;  Laterality: Right;   TOTAL HIP ARTHROPLASTY Right 08/06/2014   Procedure: RIGHT TOTAL HIP ARTHROPLASTY ANTERIOR APPROACH;  Surgeon: Aurther Blue, MD;  Location: [facility] ORS;  Service: Orthopedics;  Laterality: Right;   TOTAL HIP ARTHROPLASTY Left 12/17/2014   Procedure: LEFT TOTAL HIP ARTHROPLASTY ANTERIOR APPROACH;  Surgeon: Aurther Blue, MD;  Location: [facility] ORS;  Service: Orthopedics;  Laterality: Left;   Social History:  reports that she has never smoked. She has never used smokeless tobacco. She reports that she does not drink alcohol and does not use drugs.  Family / Support Systems Patient Roles: Parent, Other (Comment)  (friend) Children: Sherida Dimmer [phone]  Scarlet Curly [phone] Other Supports: Another son local and one out of town.  Friends Anticipated Caregiver: Son's Ability/Limitations of Caregiver: Son unsure if can proivde the care pt will require at discharge. it depends upon how much it is Caregiver Availability: Other (Comment) (Unsure can do 24/7 care) Family Dynamics: Close with son's lives with Skip and his wife. All are involved and supportive  Social History Preferred language: English Religion: Pentecostal Cultural Background: NA Education: HS Health Literacy - How often do you need to have someone help you when you read instructions, pamphlets, or other written material from your doctor or pharmacy?: Never Writes: Yes Employment Status: Retired Marine scientist Issues: NA Guardian/Conservator: None-accoroding to MD pt is capable of making her own decisions while here   Abuse/Neglect Abuse/Neglect Assessment Can Be Completed: Yes Physical Abuse: Denies Verbal Abuse: Denies Sexual Abuse: Denies Exploitation of patient/patient's resources: Denies Self-Neglect: Denies  Patient response to: Social Isolation - How often do you feel lonely or isolated from those around you?: Rarely  Emotional Status Pt's affect, behavior and adjustment status: Pt is motivated to imprve aware of WBissues and feels her neuro-pathy is more of an issue than her fracture. She was mod/i with walker and scooter prior to admission Recent Psychosocial Issues: other health issues Psychiatric History: Hx depression takes medications for this and finds them helpful. Substance Abuse History: NA  Patient /  Family Perceptions, Expectations & Goals Pt/Family understanding of illness & functional limitations: Pt is able to explain her fracture and WB issues, this will be an issue due to other health issues and contributing to her lack of movement and limitation. She does talk with the MD and feels has a good  understanding of her plan moving forward. Premorbid pt/family roles/activities: mom, grandmother, retiree Anticipated changes in roles/activities/participation: resume Pt/family expectations/goals: Pt states: " I hope to do well I am not sure Skip can do all of this."  [employer]: None Premorbid Home Care/DME Agencies: Other (Comment) (rw, scooter, bsc, etc) Transportation available at discharge: son Is the patient able to respond to transportation needs?: Yes In the past 12 months, has lack of transportation kept you from medical appointments or from getting medications?: No In the past 12 months, has lack of transportation kept you from meetings, work, or from getting things needed for daily living?: No Resource referrals recommended: Neuropsychology  Discharge Planning Living Arrangements: Children Support Systems: Children, Other relatives, Friends/neighbors Type of Residence: Private residence Insurance Resources: Electrical engineer Resources: Tree surgeon, Family Support Financial Screen Referred: No Living Expenses: Lives with family Money Management: Family Does the patient have any problems obtaining your medications?: No Home Management: self and son Patient/Family Preliminary Plans: Return home with son who is able to help some but not sure can provide all the care she will need. Will await progress and see if can. Aware being evaluated today and goals being set for stay here Care Coordinator Barriers to Discharge: Weight bearing restrictions Care Coordinator Anticipated Follow Up Needs: HH/OP  Clinical Impression Pleasant female who is motivated to do well and recover but will need time to recover and heal from this fracture. She lives with one of her son's and he is not sure he can do the care she will need. Will await therapy evaluations and work on best plan for her.  Mardell Shade 04/10/2024, 2:09 PM

## 2024-04-11 ENCOUNTER — Inpatient Hospital Stay (HOSPITAL_COMMUNITY): Admitting: Internal Medicine

## 2024-04-11 DIAGNOSIS — K5900 Constipation, unspecified: Secondary | ICD-10-CM | POA: Diagnosis not present

## 2024-04-11 DIAGNOSIS — N179 Acute kidney failure, unspecified: Secondary | ICD-10-CM | POA: Diagnosis not present

## 2024-04-11 DIAGNOSIS — S7291XD Unspecified fracture of right femur, subsequent encounter for closed fracture with routine healing: Secondary | ICD-10-CM | POA: Diagnosis not present

## 2024-04-11 DIAGNOSIS — M25551 Pain in right hip: Secondary | ICD-10-CM | POA: Diagnosis not present

## 2024-04-11 NOTE — Progress Notes (Signed)
 Occupational Therapy Note  Patient Details  Name: [name] MRN: [id number] Date of Birth: 11/27/1930  Today's Date: 04/11/2024 OT Missed Time: 30 Minutes Missed Time Reason: Other (comment) (eating lunch)  Pt sitting in w/c upon arrival. Pt reported that she just received her lunch and needed time to eat. Pt missed 30 mins skilled OT services. Will attempt to see as schedule allows.   Andre Kawasaki [facility] 04/11/2024, 2:57 PM

## 2024-04-11 NOTE — Progress Notes (Signed)
 PROGRESS NOTE   Subjective/Complaints:  Working with therapy this morning in the gym.  Reports she slept okay last night.  Pain overall controlled with current regimen.   ROS: Patient denies fever, new vision changes, dizziness, nausea, vomiting, diarrhea,  shortness of breath or chest pain, headache, or mood change. + Chronic stress incontinence- unchanged-present for over 10 years  Objective:   No results found.  Recent Labs    04/10/24 0511  WBC 8.8  HGB 8.4*  HCT 26.1*  PLT 268   Recent Labs    04/10/24 0511  NA 136  K 3.5  CL 104  CO2 22  GLUCOSE 105*  BUN 19  CREATININE 0.87  CALCIUM  9.2    Intake/Output Summary (Last 24 hours) at 04/11/2024 0844 Last data filed at 04/11/2024 0742 Gross per 24 hour  Intake 997 ml  Output 975 ml  Net 22 ml        Physical Exam: Vital Signs Blood pressure (!) 152/65, pulse 82, temperature 98.5 F (36.9 C), temperature source Oral, resp. rate 18, height 5\' 10"  (1.778 m), weight 109.1 kg, SpO2 96%.  General: No apparent distress, well-dressed, working with therapy in the gym HEENT: [place], AT, MMM Neck: Supple without JVD or lymphadenopathy Heart: RRR Chest: CTA bilaterally without wheezes, rales, or rhonchi; no distress Abdomen: Soft, non-tender, non-distended, bowel sounds positive. Extremities: Trace bilateral lower extremity swelling.   Right hip incision with dressing CDI, appropriately tender. Psych: Pt's affect is appropriate. Pt is cooperative Skin: Clean and intact without signs of breakdown Neuro:    Mental Status: AAOx3 Speech/Languate: fluent CRANIAL NERVES: Grossly intact  Follows full commands. Decreased to light touch in hands B/L- not rest of Ue's- B/L as well as L1 to S2 B/L- worse in LE's- more sensory deficits  Ue's 5-/5 LLE 5-/5 throughout RLE_ HF 2/5; KE 5-/5 and DF/PF 5-/5- limited by pain when testing MSK strength   MSK: R hip moderate  swelling    Assessment/Plan: 1. Functional deficits which require 3+ hours per day of interdisciplinary therapy in a comprehensive inpatient rehab setting. Physiatrist is providing close team supervision and 24 hour management of active medical problems listed below. Physiatrist and rehab team continue to assess barriers to discharge/monitor patient progress toward functional and medical goals  Care Tool:  Bathing    Body parts bathed by patient: Right arm, Left arm, Chest, Abdomen, Right upper leg, Left upper leg, Face   Body parts bathed by helper: Front perineal area, Buttocks, Right lower leg, Left lower leg     Bathing assist Assist Level: Moderate Assistance - Patient 50 - 74%     Upper Body Dressing/Undressing Upper body dressing   What is the patient wearing?: Pull over shirt    Upper body assist Assist Level: Set up assist    Lower Body Dressing/Undressing Lower body dressing      What is the patient wearing?: Incontinence brief, Pants     Lower body assist Assist for lower body dressing: Dependent - Patient 0%     Toileting Toileting    Toileting assist Assist for toileting: Dependent - Patient 0%     Transfers Chair/bed transfer  Transfers  assist     Chair/bed transfer assist level: Dependent - mechanical lift     Locomotion Ambulation   Ambulation assist   Ambulation activity did not occur: Safety/medical concerns (pain/fatigue)          Walk 10 feet activity   Assist  Walk 10 feet activity did not occur: Safety/medical concerns        Walk 50 feet activity   Assist Walk 50 feet with 2 turns activity did not occur: Safety/medical concerns         Walk 150 feet activity   Assist Walk 150 feet activity did not occur: Safety/medical concerns         Walk 10 feet on uneven surface  activity   Assist Walk 10 feet on uneven surfaces activity did not occur: Safety/medical concerns          Wheelchair     Assist Is the patient using a wheelchair?: Yes Type of Wheelchair: Manual    Wheelchair assist level: Dependent - Patient 0%      Wheelchair 50 feet with 2 turns activity    Assist        Assist Level: Dependent - Patient 0%   Wheelchair 150 feet activity     Assist      Assist Level: Dependent - Patient 0%   Blood pressure (!) 152/65, pulse 82, temperature 98.5 F (36.9 C), temperature source Oral, resp. rate 18, height 5\' 10"  (1.778 m), weight 109.1 kg, SpO2 96%.   Medical Problem List and Plan: 1. Functional deficits secondary to right supracondylar periprosthetic distal femur fracture.  Status post open reduction internal fixation 04/04/2024 per Dr. Guyann Leitz. TDWB right lower extremity for 4-6 weeks             -patient may  shower             -ELOS/Goals: 12-14 days Supervision to min A             -Continue CIR  2.  Antithrombotics: -DVT/anticoagulation:  Pharmaceutical: Eliquis.  Venous Doppler study negative.             -antiplatelet therapy: N/A 3. Pain Management: Lyrica  25 mg BID,, oxycodone  as needed- more pain from peripheral neuropathy than her RLE- mainly L ankle per pt  -5/8 occasional use of oxycodone , continue current regimen 4. Mood/Behavior/Sleep: Zoloft  50 mg daily, trazodone  150 mg nightly.  Provide emotional support             -antipsychotic agents: N/A 5. Neuropsych/cognition: This patient is capable of making decisions on her own behalf. 6. Skin/Wound Care: Routine skin checks 7. Fluids/Electrolytes/Nutrition: Routine in and outs with follow-up chemistries 8.  Acute blood loss anemia as well as history of normocytic anemia.  Continue iron supplement.  Follow-up CBC  - 5/7 HGB stable 8.4  Recheck monday 9.  Transient atrial fibrillation with RVR Lopressor  25 mg twice daily.  Follow-up cardiology services.  Continue Eliquis until further notice.. 10.  Obesity.  BMI 30.85.  Dietary follow-up 11.  Constipation.  MiraLAX   twice daily, Senokot daily, Colace 100 mg daily.  -5/7 improved after bowel movement today  -5/8 LBM today, continue to monitor 12.AKI.Resolved.Follow up chemestries  5/7 BUN/Cr stable, continue to monitor  Recheck monday    LOS: 2 days A FACE TO FACE EVALUATION WAS PERFORMED  [name] 04/11/2024, 8:44 AM

## 2024-04-11 NOTE — Progress Notes (Signed)
 Occupational Therapy Session Note  Patient Details  Name: [name] MRN: [id number] Date of Birth: [birth year]  Today's Date: 04/12/2024 OT Individual Time: 0800-0903 OT Individual Time Calculation (min): 63 min    Short Term Goals: Week 1:  OT Short Term Goal 1 (Week 1): Pt will complete LB dressing and bathing with Max A while maintaining weightbearing status to RLE. OT Short Term Goal 2 (Week 1): Pt will complete 2/3 toileting steps while maintaining weightbearing status to RLE.  Skilled Therapeutic Interventions/Progress Updates:    Patient agreeable to participate in OT session. Reports intermittent pain in right leg. Monitored during session.   Patient participated in skilled OT session focusing on ADL re-training and functional transfers/sit to stand transitions.   Pt participated in ADL re-training while participating in bathing at shower level.  Bed mobility: Pt required SBA in order to transition from supine to sitting EOB. VC provided for technique including using bed rails to transition. HOB elevated.   Functional transfers: Pt completed sit to stand with bed elevated and Mod A to power up. Stedy used to completed transfer to [facility] as well as BSC to/shower using TTB.   UB bathing: Pt complete while seated requiring Set-up A.  LB bathing: Pt complete while standing with use of Stedy after toileting requiring min A. Pt educated on use of long handled sponge to increase functional performance.   UB dressing: Pt complete while seated requiring Min A (assist to clasp bra).   LB dressing: Pt complete while seated requiring Max A. Due to time constraint, unable to educated pt on use of AE for LB dressing.   Grooming: Pt complete while seated at sink requiring Set-up.    Therapy Documentation Precautions:  Precautions Precautions: Fall Recall of Precautions/Restrictions: Impaired Restrictions Weight Bearing Restrictions Per Provider Order: Yes RLE Weight Bearing Per  Provider Order: Touchdown weight bearing  Therapy/Group: Individual Therapy  Carollee Circle, OTR/L,CBIS  Supplemental OT - [facility] Preferred   04/12/2024, 7:52 AM

## 2024-04-11 NOTE — Progress Notes (Signed)
 Occupational Therapy Session Note  Patient Details  Name: [name] MRN: [id number] Date of Birth: 09/26/1930  Today's Date: 04/11/2024 OT Individual Time: 1020-1100 OT Individual Time Calculation (min): 40 min    Short Term Goals: Week 1:  OT Short Term Goal 1 (Week 1): Pt will complete LB dressing and bathing with Max A while maintaining weightbearing status to RLE. OT Short Term Goal 2 (Week 1): Pt will complete 2/3 toileting steps while maintaining weightbearing status to RLE.  Skilled Therapeutic Interventions/Progress Updates:    1:1 Pt received in the w/c and reported she already washed up and dressed cause her son was here. Did discuss showering today as an option but declined but is hopeful she can take one tomorrow.   Pt taken to the gym and focus on sit to stands and transfer training with the RW. Pt tried sit to stands pushing up with both hands from chair/mat and with one hand on the RW. Pt performed with less assistance when pushing up with both hands and then transitioning to the RW. Pt with decr endurance with standing balance. Continued discussion about weight bearing status with transfers and demonstrated proper way to transfer maintaining TDWB. Pt transferred w/c to mat with scooting her leg Le with min to mod A. Then was able to transition to taking two little "hops" forwards 2 times - difficulty and unable to step backwards.   Pt returned to the room and left sitting up in prep for next session.   Therapy Documentation Precautions:  Precautions Precautions: Fall Recall of Precautions/Restrictions: Impaired Restrictions Weight Bearing Restrictions Per Provider Order: Yes RLE Weight Bearing Per Provider Order: Touchdown weight bearing  Pain: Pain Assessment Pain Scale: 0-10 Pain Score: 4  Pain Location: Leg Pain Intervention(s): Medication (See eMAR) Pt reports pain prior and during session - allowed for rest breaks as needed and reported she received meds  prior    Therapy/Group: Individual Therapy  Henrene Locust [facility] 04/11/2024, 11:41 AM

## 2024-04-11 NOTE — Progress Notes (Signed)
 Physical Therapy Session Note  Patient Details  Name: [name] MRN: [id number] Date of Birth: 03-20-1930  Today's Date: 04/11/2024 PT Individual Time: 1105-1200 PT Individual Time Calculation (min): 55 min   Short Term Goals: Week 1:  PT Short Term Goal 1 (Week 1): pt will perform bed mobility with min A or less with LRAD PT Short Term Goal 2 (Week 1): pt will perform sit to stand with LRAD and min A PT Short Term Goal 3 (Week 1): pt will perform bed to chiar transfer with LRAD and mod A or less PT Short Term Goal 4 (Week 1): pt will initiate gait training with LRAD  Skilled Therapeutic Interventions/Progress Updates: Pt presented in w/c with son present agreeable to therapy. Pt states 7/10, premedicated with rest breaks provided during session. Session focused on UE strengthening and activity tolerance to allow improved tolerance for transfers. Pt propelled partial distance for general conditioning. Pt then participated in following therex as noted below. Pt encouraged to take intermittent rest breaks for energy conservation.   Chest press 3lb dowel 2 x 10 Biceps curls 3lb dowel 2 x 10 Circles 3lb dowel 2 x 10 Triceps extension red theraband 2 x10 Shoulder abd with 2lb weight x 15 Ball taps with 3lb dowel 3 x 15  Pt attempted chair push ups however due to fatigue unable to initiate pushing through BLE.   PTA obtained ELR for RLE due to some noted swelling in RLE. Pt expressed improved comfort with ELR. Pt transported back to room at end of session and encouraged to remain in w/c to eat lunch as OT session scheduled at 1p. Pt left in w/c at end of session and left with call bell within reach and needs met.       Therapy Documentation Precautions:  Precautions Precautions: Fall Recall of Precautions/Restrictions: Impaired Restrictions Weight Bearing Restrictions Per Provider Order: Yes RLE Weight Bearing Per Provider Order: Touchdown weight bearing General:   Vital  Signs: Therapy Vitals Temp: 98.3 F (36.8 C) Temp Source: Oral Pulse Rate: 81 Resp: 18 BP: (!) 113/58 Patient Position (if appropriate): Sitting Oxygen Therapy SpO2: 96 % O2 Device: Room Air   Therapy/Group: Individual Therapy  [name] 04/11/2024, 4:34 PM

## 2024-04-11 NOTE — Progress Notes (Addendum)
 Physical Therapy Session Note  Patient Details  Name: [name] MRN: [id number] Date of Birth: 05/12/1930  Today's Date: 04/11/2024 PT Individual Time: 0857-1002 PT Individual Time Calculation (min): 65 min   Short Term Goals: Week 1:  PT Short Term Goal 1 (Week 1): pt will perform bed mobility with min A or less with LRAD PT Short Term Goal 2 (Week 1): pt will perform sit to stand with LRAD and min A PT Short Term Goal 3 (Week 1): pt will perform bed to chiar transfer with LRAD and mod A or less PT Short Term Goal 4 (Week 1): pt will initiate gait training with LRAD  Skilled Therapeutic Interventions/Progress Updates:      Pt seated on BSC with tech upon arrival. Pt performed sit to stand with heavy mod A, while +2 donned pants and performed pericare and total A, verbal and tactile cues provided for maintenance of R LE TDWBing precautions.   Therapist donned ted hose and tennis shoes B with total A with pt seated EOB.   Pt performed stand pivot transfer +2 mod A elevated bed to WC with RW, and verbal and tactilecues provided for sequencing/technique.   Therapist switched 22 inch wide wheelchair for 20 inch wide WC. Pt self propelled WC room to main gym, main gym to room with B UE and supervision, verbal cues provided for technique.   Pt performed the following seated therex for R LE strength/ROM  1x10 active assisted R LE LAQ   1x10 active assisted R LE seated hip flexion  1x10 heel raises B   1x10 toe raises B  Pt seated in WC with all needs within reach and chair alarm on.    Therapy Documentation Precautions:  Precautions Precautions: Fall Recall of Precautions/Restrictions: Impaired Restrictions Weight Bearing Restrictions Per Provider Order: Yes RLE Weight Bearing Per Provider Order: Touchdown weight bearing   Therapy/Group: Individual Therapy  [facility] Jenney Modest, [place], DPT  04/11/2024, 12:20 PM

## 2024-04-11 NOTE — Progress Notes (Signed)
 Patient was medicated for pain once at beginning of shift for left leg pain. She requested tylenol  and it was effective. She slept well this shift. She had family visiting at beginning of shift and has been in good spirits. She has been cooperative with all care. She did use female urinal over night and some urine spilt. She has a brief on incase of spills. No concerns this shift.

## 2024-04-11 NOTE — Progress Notes (Signed)
 Inpatient Rehabilitation Center Individual Statement of Services  Patient Name:  [name]  Date:  04/11/2024  Welcome to the Inpatient Rehabilitation Center.  Our goal is to provide you with an individualized program based on your diagnosis and situation, designed to meet your specific needs.  With this comprehensive rehabilitation program, you will be expected to participate in at least 3 hours of rehabilitation therapies Monday-Friday, with modified therapy programming on the weekends.  Your rehabilitation program will include the following services:  Physical Therapy (PT), Occupational Therapy (OT), 24 hour per day rehabilitation nursing, Care Coordinator, Rehabilitation Medicine, Nutrition Services, and Pharmacy Services  Weekly team conferences will be held on Wednesday to discuss your progress.  Your Inpatient Rehabilitation Care Coordinator will talk with you frequently to get your input and to update you on team discussions.  Team conferences with you and your family in attendance may also be held.  Expected length of stay: 12-14 days  Overall anticipated outcome: supervision-min assist level  Depending on your progress and recovery, your program may change. Your Inpatient Rehabilitation Care Coordinator will coordinate services and will keep you informed of any changes. Your Inpatient Rehabilitation Care Coordinator's name and contact numbers are listed  below.  The following services may also be recommended but are not provided by the Inpatient Rehabilitation Center:   Home Health Rehabiltiation Services Outpatient Rehabilitation Services    Arrangements will be made to provide these services after discharge if needed.  Arrangements include referral to agencies that provide these services.  Your insurance has been verified to be:  Med[employer] Your primary doctor is:    Pertinent information will be shared with your doctor and your insurance company.  Inpatient  Rehabilitation Care Coordinator:  Adrianna Albee, [place] [phone] or Justine Oms  Information discussed with and copy given to patient by: Mardell Shade, 04/11/2024, 9:33 AM

## 2024-04-12 DIAGNOSIS — M79604 Pain in right leg: Secondary | ICD-10-CM

## 2024-04-12 MED ORDER — POLYETHYLENE GLYCOL 3350 17 G PO PACK
17.0000 g | PACK | Freq: Every day | ORAL | Status: DC
  Administered 2024-04-13 – 2024-04-24 (×9): 17 g via ORAL
  Filled 2024-04-12 (×13): qty 1

## 2024-04-12 NOTE — IPOC Note (Signed)
 Overall Plan of Care [facility]) Patient Details Name: [name] MRN: [id number] DOB: [birth year]  Admitting Diagnosis: Femur fracture [facility])  Hospital Problems: Principal Problem:   Femur fracture (HCC)     Functional Problem List: Nursing Pain, Bladder, Bowel, Safety, Endurance, Medication Management  PT Balance, Behavior, Edema, Endurance, Motor, Pain, Safety, Sensory  OT Balance, Endurance, Edema, Pain, Safety  SLP    TR         Basic ADL's: OT Bathing, Grooming, Dressing, Toileting     Advanced  ADL's: OT       Transfers: PT Bed Mobility, Bed to Chair, Car, Lobbyist, Technical brewer: PT Ambulation, Psychologist, prison and probation services     Additional Impairments: OT    SLP        TR      Anticipated Outcomes Item Anticipated Outcome  Self Feeding    Swallowing      Basic self-care  Min A  Toileting  Min A   Bathroom Transfers Min A  Bowel/Bladder  manage bowel w mod I assist and bladder w toileting  Transfers  supervision  Locomotion  min A  Communication     Cognition     Pain  Pain < 4 with prns  Safety/Judgment  manage safety w cues   Therapy Plan: PT Intensity: Minimum of 1-2 x/day ,45 to 90 minutes PT Frequency: 5 out of 7 days PT Duration Estimated Length of Stay: 12-14 days OT Intensity: Minimum of 1-2 x/day, 45 to 90 minutes OT Frequency: 5 out of 7 days OT Duration/Estimated Length of Stay: 12-14 days     Team Interventions: Nursing Interventions Patient/Family Education, Pain Management, Discharge Planning, Medication Management, Bladder Management, Bowel Management, Skin Care/Wound Management, Disease Management/Prevention  PT interventions Ambulation/gait training, Community reintegration, DME/adaptive equipment instruction, Neuromuscular re-education, Psychosocial support, Stair training, UE/LE Strength taining/ROM, Wheelchair propulsion/positioning, Warden/ranger, Discharge planning, Functional  electrical stimulation, Pain management, Skin care/wound management, Therapeutic Activities, UE/LE Coordination activities, Cognitive remediation/compensation, Disease management/prevention, Functional mobility training, Patient/family education, Splinting/orthotics, Therapeutic Exercise, Visual/perceptual remediation/compensation  OT Interventions Warden/ranger, DME/adaptive equipment instruction, Discharge planning, Psychosocial support, Functional mobility training, Community reintegration, Patient/family education, Therapeutic Activities, UE/LE Strength taining/ROM, Pain management, Skin care/wound managment, Self Care/advanced ADL retraining, Therapeutic Exercise  SLP Interventions    TR Interventions    SW/CM Interventions Discharge Planning, Psychosocial Support, Patient/Family Education   Barriers to Discharge MD  Medical stability and Weight bearing restrictions  Nursing Decreased caregiver support, Home environment access/layout, Weight bearing restrictions resides in a basement in [employer] [place] w son  PT Decreased caregiver support, Home environment access/layout, Incontinence, Wound Care, Weight, Weight bearing restrictions, Lack of/limited family support    OT Lack of/limited family support    SLP      SW Weight bearing restrictions     Team Discharge Planning: Destination: PT-Home ,OT- Home , SLP-  Projected Follow-up: PT-Home health PT, OT-  Home health OT, SLP-  Projected Equipment Needs: PT-To be determined, OT- To be determined, SLP-  Equipment Details: PT-pt has RW and electric scooter (unable to fit inside of house but uses it in community), OT-  Patient/family involved in discharge planning: PT- Patient,  OT-Patient, SLP-   MD ELOS: 12-14 Medical Rehab Prognosis:  Excellent Assessment: The patient has been admitted for CIR therapies with the diagnosis of right supracondylar periprosthetic distal femur fracture . The team will be addressing functional  mobility, strength, stamina, balance, safety, adaptive techniques and equipment, self-care, bowel and  bladder mgt, patient and caregiver education. Goals have been set at min A. Anticipated discharge destination is home.        See Team Conference Notes for weekly updates to the plan of care

## 2024-04-12 NOTE — Progress Notes (Signed)
 PROGRESS NOTE   Subjective/Complaints:  Patient reports she feels good today after taking a shower for the first time in several days.  Pain under control about a 3 out of 10.  Reports she slept okay. LBM today  ROS: Patient denies fever, new vision changes, dizziness, nausea, vomiting, diarrhea, constipation, shortness of breath or chest pain, headache, or mood change. + Chronic stress incontinence- unchanged-present for over 10 years  Objective:   No results found.  Recent Labs    04/10/24 0511  WBC 8.8  HGB 8.4*  HCT 26.1*  PLT 268   Recent Labs    04/10/24 0511  NA 136  K 3.5  CL 104  CO2 22  GLUCOSE 105*  BUN 19  CREATININE 0.87  CALCIUM  9.2    Intake/Output Summary (Last 24 hours) at 04/12/2024 1435 Last data filed at 04/12/2024 0739 Gross per 24 hour  Intake 240 ml  Output 400 ml  Net -160 ml        Physical Exam: Vital Signs Blood pressure (!) 106/58, pulse 75, temperature 98.1 F (36.7 C), temperature source Oral, resp. rate 20, height 5\' 10"  (1.778 m), weight 109.1 kg, SpO2 97%.  General: No apparent distress, well-dressed, appears younger than stated age HEENT: [place], AT, MMM Neck: Supple without JVD or lymphadenopathy Heart: RRR Chest: CTA bilaterally without wheezes, rales, or rhonchi; no distress Abdomen: Soft, non-tender, non-distended, bowel sounds positive. Extremities: Trace bilateral lower extremity swelling.   Right hip incision with dressing CDI, appropriately tender. Psych: Very pleasant Skin: Clean and intact without signs of breakdown Neuro:    Mental Status: AAOx3 Speech/Languate: fluent CRANIAL NERVES: Grossly intact  Follows full commands. Decreased to light touch in hands B/L- not rest of Ue's- B/L as well as L1 to S2 B/L- worse in LE's- more sensory deficits  Ue's 5-/5 LLE 5-/5 throughout RLE_ HF 3/5; KE 5-/5 and DF/PF 5-/5- limited by pain when testing MSK strength    MSK: R hip moderate swelling    Assessment/Plan: 1. Functional deficits which require 3+ hours per day of interdisciplinary therapy in a comprehensive inpatient rehab setting. Physiatrist is providing close team supervision and 24 hour management of active medical problems listed below. Physiatrist and rehab team continue to assess barriers to discharge/monitor patient progress toward functional and medical goals  Care Tool:  Bathing    Body parts bathed by patient: Right arm, Left arm, Chest, Abdomen, Right upper leg, Left upper leg, Face   Body parts bathed by helper: Front perineal area, Buttocks, Right lower leg, Left lower leg     Bathing assist Assist Level: Moderate Assistance - Patient 50 - 74%     Upper Body Dressing/Undressing Upper body dressing   What is the patient wearing?: Pull over shirt    Upper body assist Assist Level: Set up assist    Lower Body Dressing/Undressing Lower body dressing      What is the patient wearing?: Incontinence brief, Pants     Lower body assist Assist for lower body dressing: Dependent - Patient 0%     Toileting Toileting    Toileting assist Assist for toileting: Dependent - Patient 0%  Transfers Chair/bed transfer  Transfers assist     Chair/bed transfer assist level: Moderate Assistance - Patient 50 - 74%     Locomotion Ambulation   Ambulation assist   Ambulation activity did not occur: Safety/medical concerns (pain/fatigue)          Walk 10 feet activity   Assist  Walk 10 feet activity did not occur: Safety/medical concerns        Walk 50 feet activity   Assist Walk 50 feet with 2 turns activity did not occur: Safety/medical concerns         Walk 150 feet activity   Assist Walk 150 feet activity did not occur: Safety/medical concerns         Walk 10 feet on uneven surface  activity   Assist Walk 10 feet on uneven surfaces activity did not occur: Safety/medical  concerns         Wheelchair     Assist Is the patient using a wheelchair?: Yes Type of Wheelchair: Manual    Wheelchair assist level: Supervision/Verbal cueing Max wheelchair distance: 120    Wheelchair 50 feet with 2 turns activity    Assist        Assist Level: Supervision/Verbal cueing   Wheelchair 150 feet activity     Assist      Assist Level: Dependent - Patient 0%   Blood pressure (!) 106/58, pulse 75, temperature 98.1 F (36.7 C), temperature source Oral, resp. rate 20, height 5\' 10"  (1.778 m), weight 109.1 kg, SpO2 97%.   Medical Problem List and Plan: 1. Functional deficits secondary to right supracondylar periprosthetic distal femur fracture.  Status post open reduction internal fixation 04/04/2024 per Dr. Guyann Leitz. TDWB right lower extremity for 4-6 weeks             -patient may  shower             -ELOS/Goals: 12-14 days Supervision to min A             -Continue CIR  2.  Antithrombotics: -DVT/anticoagulation:  Pharmaceutical: Eliquis .  Venous Doppler study negative.             -antiplatelet therapy: N/A 3. Pain Management: Lyrica  25 mg BID,, oxycodone  as needed- more pain from peripheral neuropathy than her RLE- mainly L ankle per pt  -5/8 occasional use of oxycodone , continue current regimen  - 5/9 pain improving infrequent use of as needed oxycodone  4. Mood/Behavior/Sleep: Zoloft  50 mg daily, trazodone  150 mg nightly.  Provide emotional support             -antipsychotic agents: N/A 5. Neuropsych/cognition: This patient is capable of making decisions on her own behalf. 6. Skin/Wound Care: Routine skin checks 7. Fluids/Electrolytes/Nutrition: Routine in and outs with follow-up chemistries 8.  Acute blood loss anemia as well as history of normocytic anemia.  Continue iron supplement.  Follow-up CBC  - 5/7 HGB stable 8.4  Recheck monday 9.  Transient atrial fibrillation with RVR Lopressor  25 mg twice daily.  Follow-up cardiology services.   Continue Eliquis  until further notice.. 10.  Obesity.  BMI 30.85.  Dietary follow-up 11.  Constipation.  MiraLAX  twice daily, Senokot daily, Colace 100 mg daily.  -5/7 improved after bowel movement today  -5/9 LBM today, improved.  Decrease MiraLAX  frequency to daily 12.AKI.Resolved.Follow up chemestries  5/7 BUN/Cr stable, continue to monitor  Recheck monday    LOS: 3 days A FACE TO FACE EVALUATION WAS PERFORMED  [name] 04/12/2024, 2:35 PM

## 2024-04-12 NOTE — Progress Notes (Signed)
 Physical Therapy Session Note  Patient Details  Name: [name] MRN: [id number] Date of Birth: 12-Nov-1930  Today's Date: 04/12/2024 PT Individual Time: 1033-1130 and 1415-1530 PT Individual Time Calculation (min): 57 min and 75 min.  Short Term Goals: Week 1:  PT Short Term Goal 1 (Week 1): pt will perform bed mobility with min A or less with LRAD PT Short Term Goal 2 (Week 1): pt will perform sit to stand with LRAD and min A PT Short Term Goal 3 (Week 1): pt will perform bed to chiar transfer with LRAD and mod A or less PT Short Term Goal 4 (Week 1): pt will initiate gait training with LRAD  Skilled Therapeutic Interventions/Progress Updates:   First session:  Pt presents sitting in w/c and agreeable to therapy.  Pt negotiates w/c in hallway x 120' w/ supervision.  Pt requires extended seated rest break 2/2 fatigue.  Pt performed LE there ex, including calf raises, LAQ, hip flexion (AAROM RLE).  Pt performed multiple sit to stand transfers w/ Mod A and cues for sequencing, especially hand placement.  Pt remained sitting in w/c w/ seat alarm on and all needs in reach.  Second session:  Pt presents sitting in w/c and agreeable to therapy although states fatigue and increased pain.  Pt agreeable to go outside for improved mood.  Pt performed LE there ex sitting including calf raises, LAQ, abd/add and hip flexion 3 x 10.  Pt requires extended seated rest breaks 2/2 fatigue and cues for breathing techniques.  Pt performed sit to stand w/ mod A and performs standing knee/hip flexion 2 x 5.  Pt returned to indoors and improved breathing.  Pt performed UBE at Level 1 5' paced program 35 rpm and then 2 1/2' at 40 rpm paced and then decreased to 35 rpm x 2 1/2'.  Pt performed throwing/catching and bouncing plastic ball.  Pt returned to toom and performed mod A SPT w/c > bed.  Pt required only min A for RLE into bed and scoots to center of bed.  Bed alarm on and all needs in reach.     Therapy  Documentation Precautions:  Precautions Precautions: Fall Recall of Precautions/Restrictions: Impaired Restrictions Weight Bearing Restrictions Per Provider Order: Yes RLE Weight Bearing Per Provider Order: Touchdown weight bearing General:   Vital Signs: Therapy Vitals Pulse Rate: 87 BP: 105/89 Pain:3/10, 6/10 in PM session.      Therapy/Group: Individual Therapy  Shameika Speelman P Ciela Mahajan 04/12/2024, 11:34 AM

## 2024-04-13 NOTE — Progress Notes (Addendum)
 PROGRESS NOTE   Subjective/Complaints:  Pt reports doesn't eat much- only ate 25% of breakfast.  Upset that in hospital at mother's day because entire family gathering- LBM yesterday.   ROS:   Pt denies SOB, abd pain, CP, N/V/C/D, and vision changes  + Chronic stress incontinence- unchanged-present for over 10 years  Objective:   No results found.  No results for input(s): "WBC", "HGB", "HCT", "PLT" in the last 72 hours.  No results for input(s): "NA", "K", "CL", "CO2", "GLUCOSE", "BUN", "CREATININE", "CALCIUM " in the last 72 hours.   Intake/Output Summary (Last 24 hours) at 04/13/2024 1259 Last data filed at 04/13/2024 1000 Gross per 24 hour  Intake 738 ml  Output --  Net 738 ml        Physical Exam: Vital Signs Blood pressure 136/70, pulse 93, temperature 98.4 F (36.9 C), temperature source Oral, resp. rate 18, height 5\' 10"  (1.778 m), weight 109.1 kg, SpO2 96%.    General: awake, alert, appropriate, sitting up in bed;  NAD HENT: conjugate gaze; oropharynx moist CV: regular rate; no JVD Pulmonary: CTA B/L; no W/R/R- good air movement GI: soft, NT, ND, (+)BS Psychiatric: appropriate but tearful at times;  Neurological: Ox3  Extremities: Trace bilateral lower extremity swelling.   Right hip incision with dressing CDI, appropriately tender.- looks about the same Psych: Very pleasant Skin: Clean and intact without signs of breakdown Neuro:    Mental Status: AAOx3 Speech/Languate: fluent CRANIAL NERVES: Grossly intact  Follows full commands. Decreased to light touch in hands B/L- not rest of Ue's- B/L as well as L1 to S2 B/L- worse in LE's- more sensory deficits  Ue's 5-/5 LLE 5-/5 throughout RLE_ HF 3/5; KE 5-/5 and DF/PF 5-/5- limited by pain when testing MSK strength   MSK: R hip moderate swelling    Assessment/Plan: 1. Functional deficits which require 3+ hours per day of interdisciplinary  therapy in a comprehensive inpatient rehab setting. Physiatrist is providing close team supervision and 24 hour management of active medical problems listed below. Physiatrist and rehab team continue to assess barriers to discharge/monitor patient progress toward functional and medical goals  Care Tool:  Bathing    Body parts bathed by patient: Right arm, Left arm, Chest, Abdomen, Right upper leg, Left upper leg, Face   Body parts bathed by helper: Front perineal area, Buttocks, Right lower leg, Left lower leg     Bathing assist Assist Level: Moderate Assistance - Patient 50 - 74%     Upper Body Dressing/Undressing Upper body dressing   What is the patient wearing?: Pull over shirt    Upper body assist Assist Level: Set up assist    Lower Body Dressing/Undressing Lower body dressing      What is the patient wearing?: Incontinence brief, Pants     Lower body assist Assist for lower body dressing: Dependent - Patient 0%     Toileting Toileting    Toileting assist Assist for toileting: Dependent - Patient 0%     Transfers Chair/bed transfer  Transfers assist     Chair/bed transfer assist level: Moderate Assistance - Patient 50 - 74%     Locomotion Ambulation   Ambulation assist  Ambulation activity did not occur: Safety/medical concerns (pain/fatigue)          Walk 10 feet activity   Assist  Walk 10 feet activity did not occur: Safety/medical concerns        Walk 50 feet activity   Assist Walk 50 feet with 2 turns activity did not occur: Safety/medical concerns         Walk 150 feet activity   Assist Walk 150 feet activity did not occur: Safety/medical concerns         Walk 10 feet on uneven surface  activity   Assist Walk 10 feet on uneven surfaces activity did not occur: Safety/medical concerns         Wheelchair     Assist Is the patient using a wheelchair?: Yes Type of Wheelchair: Manual    Wheelchair assist  level: Supervision/Verbal cueing Max wheelchair distance: 120    Wheelchair 50 feet with 2 turns activity    Assist        Assist Level: Supervision/Verbal cueing   Wheelchair 150 feet activity     Assist      Assist Level: Dependent - Patient 0%   Blood pressure 136/70, pulse 93, temperature 98.4 F (36.9 C), temperature source Oral, resp. rate 18, height 5\' 10"  (1.778 m), weight 109.1 kg, SpO2 96%.   Medical Problem List and Plan: 1. Functional deficits secondary to right supracondylar periprosthetic distal femur fracture.  Status post open reduction internal fixation 04/04/2024 per Dr. Guyann Leitz. TDWB right lower extremity for 4-6 weeks             -patient may  shower             -ELOS/Goals: 12-14 days Supervision to min A             Con't CIR  2.  Antithrombotics: -DVT/anticoagulation:  Pharmaceutical: Eliquis .  Venous Doppler study negative.             -antiplatelet therapy: N/A 3. Pain Management: Lyrica  25 mg BID,, oxycodone  as needed- more pain from peripheral neuropathy than her RLE- mainly L ankle per pt  -5/8 occasional use of oxycodone , continue current regimen  - 5/9 pain improving infrequent use of as needed oxycodone   5/10- pt reports pain usually controlled with meds 4. Mood/Behavior/Sleep: Zoloft  50 mg daily, trazodone  150 mg nightly.  Provide emotional support  5/10- needed help to deal with grief of being in hospital right now             -antipsychotic agents: N/A 5. Neuropsych/cognition: This patient is capable of making decisions on her own behalf. 6. Skin/Wound Care: Routine skin checks 7. Fluids/Electrolytes/Nutrition: Routine in and outs with follow-up chemistries 8.  Acute blood loss anemia as well as history of normocytic anemia.  Continue iron supplement.  Follow-up CBC  - 5/7 HGB stable 8.4  Recheck monday 9.  Transient atrial fibrillation with RVR Lopressor  25 mg twice daily.  Follow-up cardiology services.  Continue Eliquis  until further  notice.. 10.  Obesity.  BMI 30.85.  Dietary follow-up  5/10- BMI up to 34.51- will follow weight because last weight was significantly less  11.  Constipation.  MiraLAX  twice daily, Senokot daily, Colace 100 mg daily.  -5/7 improved after bowel movement today  -5/9 LBM today, improved.  Decrease MiraLAX  frequency to daily  5/10- LBM yesterday 12.AKI.Resolved.Follow up chemestries  5/7 BUN/Cr stable, continue to monitor  Recheck monday    I spent a total of  37  minutes on total care today- >50% coordination of care- due to  D/w pt about her grieving- in hospital and d/w pt and nursing about pain; bowels and bladder and some frustrations from being in hospital  LOS: 4 days A FACE TO FACE EVALUATION WAS PERFORMED  [name] 04/13/2024, 12:59 PM

## 2024-04-13 NOTE — Plan of Care (Signed)
  Problem: Consults Goal: RH GENERAL PATIENT EDUCATION Description: See Patient Education module for education specifics. Outcome: Progressing   Problem: RH BOWEL ELIMINATION Goal: RH STG MANAGE BOWEL WITH ASSISTANCE Description: STG Manage Bowel with mod I Assistance. Outcome: Progressing Goal: RH STG MANAGE BOWEL W/MEDICATION W/ASSISTANCE Description: STG Manage Bowel with Medication with mod I Assistance. Outcome: Progressing   Problem: RH BLADDER ELIMINATION Goal: RH STG MANAGE BLADDER WITH ASSISTANCE Description: STG Manage Bladder With toileting Assistance Outcome: Progressing   Problem: RH SKIN INTEGRITY Goal: RH STG SKIN FREE OF INFECTION/BREAKDOWN Description: Manage w min assist Outcome: Progressing   Problem: RH SAFETY Goal: RH STG ADHERE TO SAFETY PRECAUTIONS W/ASSISTANCE/DEVICE Description: STG Adhere to Safety Precautions With cues Assistance/Device. Outcome: Progressing   Problem: RH PAIN MANAGEMENT Goal: RH STG PAIN MANAGED AT OR BELOW PT'S PAIN GOAL Description: < 4 with prns Outcome: Progressing   Problem: RH KNOWLEDGE DEFICIT GENERAL Goal: RH STG INCREASE KNOWLEDGE OF SELF CARE AFTER HOSPITALIZATION Description: Patient and family will be able to manage care at discharge using educational resources for medications, skin care, etc independently Outcome: Progressing

## 2024-04-14 MED ORDER — POLYETHYLENE GLYCOL 3350 17 G PO PACK: 17.0000 g | PACK | Freq: Every day | ORAL | Status: DC | PRN

## 2024-04-14 MED ORDER — SENNA 8.6 MG PO TABS
2.0000 | ORAL_TABLET | Freq: Every day | ORAL | Status: DC
  Administered 2024-04-14 – 2024-04-25 (×12): 17.2 mg via ORAL
  Filled 2024-04-14 (×12): qty 2

## 2024-04-14 NOTE — Progress Notes (Signed)
 Physical Therapy Session Note  Patient Details  Name: [name] MRN: [id number] Date of Birth: 1930-08-13  Today's Date: 04/14/2024 PT Individual Time: 0800-0902 PT Individual Time Calculation (min): 62 min   Short Term Goals: Week 1:  PT Short Term Goal 1 (Week 1): pt will perform bed mobility with min A or less with LRAD PT Short Term Goal 2 (Week 1): pt will perform sit to stand with LRAD and min A PT Short Term Goal 3 (Week 1): pt will perform bed to chiar transfer with LRAD and mod A or less PT Short Term Goal 4 (Week 1): pt will initiate gait training with LRAD  Skilled Therapeutic Interventions/Progress Updates:      Pt sitting EOB upon with tech upon arrival. Pt agreeable to therapy. Pt reports 6/10 R LE pain. Notified nursing, nurse present at start of session to administer pain medication. Therpaist provided rest breaks and repositioning  Ted hose donned dependently for edema management, education provided for importance of elevating  R LE on pillows while in bed for edema management. Elevated R LE on leg rest during rest breaks throughout session.   Pt donned diaper and pants while seated EOB with use of lateral trunk lean with intermittent assist from therapist, verbal cues provided for technique/sequencing. Pt required frequent seated rest breaks 2/2 SOB and fatigue. Verbal cues provided for not holding breath/mouth breathing , breathing in through nose and out through mouth. Pt very excited about being able to donn her pants.   Pt required supine rest break post donning pants 2/2 LBP with prolonged sitting on BSC prior to therapist arrival. Pt performed sit to supine with use of bed rail and supervision with bed elevated to 11 degress.   Pt performed sit to stand from elevated bed with RW and min A, verbal cues provided for B UE positioning pushing up from bed then transitioning to RW. Pt demos good carry over of R LE TDWBing. Pt performed stand pivot transfer with RW  and +2 min A, verbal and tactile cues provided for technique/sequencing, pt demos improved heel clearance today with pivot.   Pt ambulated 1x3, 1x5, 1x8 feet with RW and min A for assistance with management of RW, and +2 for close WC follow. Therapist provided demonstration of technique with emphasis on L LE TDWBing. Therapist added co-band to RW for improved grip (2/2 peripheral neuropathy) as pt required seated rest break on first two ambulation trails 2/2 her hands slipping.   Sit to stand from [facility] with RW and heavy min A for power up. Verbal cues provided for UE positioning and L LE positioning.   Pt performed the following therex for B LE strengthening/ROM/edema managament, contracture prevention:   LAQ x10 L LE active assisted  LAQ x10 L LE active (within pt range)   Toe raises x20 B   Heel raises x20 B  Quad sets holding for 5 seconds with R LE propped up on chair.   Pt seated in WC with R LE propped up on chair at end of session with all needs within reach and chair alarm on. Notified tech of pt position.      Therapy Documentation Precautions:  Precautions Precautions: Fall Recall of Precautions/Restrictions: Impaired Restrictions Weight Bearing Restrictions Per Provider Order: Yes RLE Weight Bearing Per Provider Order: Touchdown weight bearing  Therapy/Group: Individual Therapy  [facility] Jenney Modest, [place], DPT  04/14/2024, 7:38 AM

## 2024-04-14 NOTE — Plan of Care (Signed)
  Problem: Consults Goal: RH GENERAL PATIENT EDUCATION Description: See Patient Education module for education specifics. Outcome: Progressing   Problem: RH BOWEL ELIMINATION Goal: RH STG MANAGE BOWEL WITH ASSISTANCE Description: STG Manage Bowel with mod I Assistance. Outcome: Progressing Goal: RH STG MANAGE BOWEL W/MEDICATION W/ASSISTANCE Description: STG Manage Bowel with Medication with mod I Assistance. Outcome: Progressing   Problem: RH BLADDER ELIMINATION Goal: RH STG MANAGE BLADDER WITH ASSISTANCE Description: STG Manage Bladder With toileting Assistance Outcome: Progressing   Problem: RH SKIN INTEGRITY Goal: RH STG SKIN FREE OF INFECTION/BREAKDOWN Description: Manage w min assist Outcome: Progressing   Problem: RH SAFETY Goal: RH STG ADHERE TO SAFETY PRECAUTIONS W/ASSISTANCE/DEVICE Description: STG Adhere to Safety Precautions With cues Assistance/Device. Outcome: Progressing   Problem: RH PAIN MANAGEMENT Goal: RH STG PAIN MANAGED AT OR BELOW PT'S PAIN GOAL Description: < 4 with prns Outcome: Progressing   Problem: RH KNOWLEDGE DEFICIT GENERAL Goal: RH STG INCREASE KNOWLEDGE OF SELF CARE AFTER HOSPITALIZATION Description: Patient and family will be able to manage care at discharge using educational resources for medications, skin care, etc independently Outcome: Progressing

## 2024-04-14 NOTE — Progress Notes (Signed)
 Occupational Therapy Session Note  Patient Details  Name: [name] MRN: [id number] Date of Birth: 10/29/1930  Today's Date: 04/14/2024 OT Individual Time: 4098-1191 OT Individual Time Calculation (min): 60 min    Short Term Goals: Week 1:  OT Short Term Goal 1 (Week 1): Pt will complete LB dressing and bathing with Max A while maintaining weightbearing status to RLE. OT Short Term Goal 2 (Week 1): Pt will complete 2/3 toileting steps while maintaining weightbearing status to RLE.  Skilled Therapeutic Interventions/Progress Updates:   (1st OT Treatment session:   Patient seated in  w/c at the time of arrival with her RLE elevated to address her  edema. Patient reported not being able to  rest during the night and she went on to say that she voided 4x during the night with a slight concern for having an  UTI.  Patient reported a pain response of 6 on a 0-10 for her RLE.  Patient was able to simulate UB dressing following  demonstration and initial cues. The pt was able to simulate LB dressing by using the reacher for donning the theraband onto BLE and leaning to the side to bring the band up onto her hips. The pt was able to come from sit to stand using the RW and the arm of the w/c with ModA while  adhering to wt bearing precaution. Upon standing , the pt was able to donn the band over her hips and bottom.  The pt was instructed in relaxation breathing to improve compliance and was able to demonstrate effective carryover.    The pt went on to complete UB exercise using a 1lb dowel 2 sets of 10 for shld flexion, horizontal abduction, shld rotation, and lg circles with rest breaks as needed to improve her compliance.  The pt required 3 rest breaks and was able to incorporate relaxation breathing. The pt went on to complete sit to stand 3x using the RW and the arm of the w/c requiring ModA for transitioning into stand.  At the end of the session, the pt remained at w/c LOF with the call light  and bed side table in place and her feet positioned on the footrest of the w/c.    (2nd) OT Treatment Session:  Patient seat w/c LOF at the time of arrival.  Patient completed simulated task in UB dressing 2x  with MinA.  The pt was instructed to thread her arms into the over head shirt and then follow through by doffing the shirt over head.  The pt went on to complete UB exercises using a towel for shld flexion, horizontal abduction, shld rotation 2 sets of 10 with rest breaks as needed.  The pt required 4 rest breaks.  The pt went  on to complete the UB cycle for 15 minutes in duration with rest breaks as needed, the pt required 2 rest breaks. The pt was able to transfer from w/c LOF by coming from sit to stand with ModA using the RW and the arm rail of the bed for placement EOB.  The pt was able to transfer from EOB to supine in bed with ModA.  The pt was able to partially adjust her positioning to leaning to the side for doffing her LB garments, a brief and her pants at Dep for placement of the urinal.  The pt was able to void a small amount.  Peri care was completed at [facility] and the pt brief and pants were donned with MaxA.  At the end of the session, the call light and bedside table were placed within reach with all additional needs addressed.   Therapy Documentation Precautions:  Precautions Precautions: Fall Recall of Precautions/Restrictions: Impaired Restrictions Weight Bearing Restrictions Per Provider Order: Yes RLE Weight Bearing Per Provider Order: Touchdown weight bearing  Therapy/Group: Individual Therapy  Moises Ang 04/14/2024, 4:43 PM

## 2024-04-14 NOTE — Progress Notes (Signed)
 PROGRESS NOTE   Subjective/Complaints:  [name] reports she cannot have BM this AM- even sitting on BSC< and cannot go- wants meds ot go- usually goes 1-2x/day and usually going daily, but not today. Doesn't want ot get backed up.    Has chronic constipation.  Cannot "skip"- with big bulky shoes her son brought, so asking if can walk with her no skid socks- I think that's reasonable ot try    ROS:    [name] denies SOB, abd pain, CP, N/V/ (+)C/D, and vision changes  Chronic constipation (+)  + Chronic stress incontinence- unchanged-present for over 10 years  Objective:   No results found.  No results for input(s): "WBC", "HGB", "HCT", "PLT" in the last 72 hours.  No results for input(s): "NA", "K", "CL", "CO2", "GLUCOSE", "BUN", "CREATININE", "CALCIUM " in the last 72 hours.   Intake/Output Summary (Last 24 hours) at 04/14/2024 1205 Last data filed at 04/14/2024 0226 Gross per 24 hour  Intake --  Output 425 ml  Net -425 ml        Physical Exam: Vital Signs Blood pressure (!) 154/69, pulse 91, temperature 99 F (37.2 C), temperature source Oral, resp. rate 18, height 5\' 10"  (1.778 m), weight 109.1 kg, SpO2 95%.      General: awake, alert, appropriate, sitting on BSC at bedside; NAD HENT: conjugate gaze; oropharynx moist CV: regular rate and rhythm- rate in 90's; no JVD Pulmonary: CTA B/L; no W/R/R- good air movement GI: soft, NT, ND, (+)BS- slightly hypoactive,  Psychiatric: appropriate Neurological: Ox3   Extremities: Trace bilateral lower extremity swelling.   Right hip incision with dressing CDI, appropriately tender.- looks about the same Psych: Very pleasant Skin: Clean and intact without signs of breakdown Neuro:    Mental Status: AAOx3 Speech/Languate: fluent CRANIAL NERVES: Grossly intact  Follows full commands. Decreased to light touch in hands B/L- not rest of Ue's- B/L as well as L1 to S2 B/L-  worse in LE's- more sensory deficits  Ue's 5-/5 LLE 5-/5 throughout RLE_ HF 3/5; KE 5-/5 and DF/PF 5-/5- limited by pain when testing MSK strength   MSK: R hip moderate swelling    Assessment/Plan: 1. Functional deficits which require 3+ hours per day of interdisciplinary therapy in a comprehensive inpatient rehab setting. Physiatrist is providing close team supervision and 24 hour management of active medical problems listed below. Physiatrist and rehab team continue to assess barriers to discharge/monitor patient progress toward functional and medical goals  Care Tool:  Bathing    Body parts bathed by patient: Right arm, Left arm, Chest, Abdomen, Right upper leg, Left upper leg, Face   Body parts bathed by helper: Front perineal area, Buttocks, Right lower leg, Left lower leg     Bathing assist Assist Level: Moderate Assistance - Patient 50 - 74%     Upper Body Dressing/Undressing Upper body dressing   What is the patient wearing?: Pull over shirt    Upper body assist Assist Level: Set up assist    Lower Body Dressing/Undressing Lower body dressing      What is the patient wearing?: Incontinence brief, Pants     Lower body assist Assist for lower body dressing:  Dependent - Patient 0%     Toileting Toileting    Toileting assist Assist for toileting: Dependent - Patient 0%     Transfers Chair/bed transfer  Transfers assist     Chair/bed transfer assist level: Moderate Assistance - Patient 50 - 74%     Locomotion Ambulation   Ambulation assist   Ambulation activity did not occur: Safety/medical concerns (pain/fatigue)          Walk 10 feet activity   Assist  Walk 10 feet activity did not occur: Safety/medical concerns        Walk 50 feet activity   Assist Walk 50 feet with 2 turns activity did not occur: Safety/medical concerns         Walk 150 feet activity   Assist Walk 150 feet activity did not occur: Safety/medical  concerns         Walk 10 feet on uneven surface  activity   Assist Walk 10 feet on uneven surfaces activity did not occur: Safety/medical concerns         Wheelchair     Assist Is the patient using a wheelchair?: Yes Type of Wheelchair: Manual    Wheelchair assist level: Supervision/Verbal cueing Max wheelchair distance: 120    Wheelchair 50 feet with 2 turns activity    Assist        Assist Level: Supervision/Verbal cueing   Wheelchair 150 feet activity     Assist      Assist Level: Dependent - Patient 0%   Blood pressure (!) 154/69, pulse 91, temperature 99 F (37.2 C), temperature source Oral, resp. rate 18, height 5\' 10"  (1.778 m), weight 109.1 kg, SpO2 95%.   Medical Problem List and Plan: 1. Functional deficits secondary to right supracondylar periprosthetic distal femur fracture.  Status post open reduction internal fixation 04/04/2024 per Dr. Guyann Leitz. TDWB right lower extremity for 4-6 weeks             -patient may  shower             -ELOS/Goals: 12-14 days Supervision to min A             Con't CIR- having mother's day family get together here today 2.  Antithrombotics: -DVT/anticoagulation:  Pharmaceutical: Eliquis .  Venous Doppler study negative.             -antiplatelet therapy: N/A 3. Pain Management: Lyrica  25 mg BID,, oxycodone  as needed- more pain from peripheral neuropathy than her RLE- mainly L ankle per [name]  -5/8 occasional use of oxycodone , continue current regimen  - 5/9 pain improving infrequent use of as needed oxycodone   5/10-5/11 [name] reports pain usually controlled with meds 4. Mood/Behavior/Sleep: Zoloft  50 mg daily, trazodone  150 mg nightly.  Provide emotional support  5/10- needed help to deal with grief of being in hospital right now             -antipsychotic agents: N/A 5. Neuropsych/cognition: This patient is capable of making decisions on her own behalf. 6. Skin/Wound Care: Routine skin checks 7.  Fluids/Electrolytes/Nutrition: Routine in and outs with follow-up chemistries 8.  Acute blood loss anemia as well as history of normocytic anemia.  Continue iron supplement.  Follow-up CBC  - 5/7 HGB stable 8.4  Recheck monday 9.  Transient atrial fibrillation with RVR Lopressor  25 mg twice daily.  Follow-up cardiology services.  Continue Eliquis  until further notice.. 10.  Obesity.  BMI 30.85.  Dietary follow-up  5/10- BMI up to 34.51- will follow  weight because last weight was significantly less  11.  Acute on Chronic Constipation.  MiraLAX  twice daily, Senokot daily, Colace 100 mg daily.  -5/7 improved after bowel movement today  -5/9 LBM today, improved.  Decrease MiraLAX  frequency to daily  5/10- LBM yesterday  5/11- Will add Senna 2 tabs daily and miralax  daily prn along with daily scheduled due to constipation today and cannot go  12.AKI.Resolved.Follow up chemestries  5/7 BUN/Cr stable, continue to monitor  Recheck monday     LOS: 5 days A FACE TO FACE EVALUATION WAS PERFORMED  [name] 04/14/2024, 12:05 PM

## 2024-04-14 NOTE — Progress Notes (Signed)
 Occupational Therapy Session Note  Patient Details  Name: [name] MRN: [id number] Date of Birth: October 24, 1930  {CHL IP REHAB OT TIME CALCULATIONS:304400400}   Short Term Goals: Week 1:  OT Short Term Goal 1 (Week 1): Pt will complete LB dressing and bathing with Max A while maintaining weightbearing status to RLE. OT Short Term Goal 2 (Week 1): Pt will complete 2/3 toileting steps while maintaining weightbearing status to RLE.  Skilled Therapeutic Interventions/Progress Updates:    Patient agreeable to participate in OT session. Reports *** pain level.   Patient participated in skilled OT session focusing on ***. Therapist facilitated/assessed/developed/educated/integrated/elicited *** in order to improve/facilitate/promote    Therapy Documentation Precautions:  Precautions Precautions: Fall Recall of Precautions/Restrictions: Impaired Restrictions Weight Bearing Restrictions Per Provider Order: Yes RLE Weight Bearing Per Provider Order: Touchdown weight bearing    Therapy/Group: Individual Therapy  Carollee Circle, OTR/L,CBIS  Supplemental OT - [facility] Secure Chat Preferred   04/14/2024, 9:01 PM

## 2024-04-15 ENCOUNTER — Encounter (HOSPITAL_COMMUNITY): Payer: Self-pay | Admitting: Orthopedic Surgery

## 2024-04-15 DIAGNOSIS — R350 Frequency of micturition: Secondary | ICD-10-CM

## 2024-04-15 DIAGNOSIS — E876 Hypokalemia: Secondary | ICD-10-CM

## 2024-04-15 LAB — CBC
HCT: 24.5 % — ABNORMAL LOW (ref 36.0–46.0)
Hemoglobin: 7.9 g/dL — ABNORMAL LOW (ref 12.0–15.0)
MCH: 29.6 pg (ref 26.0–34.0)
MCHC: 32.2 g/dL (ref 30.0–36.0)
MCV: 91.8 fL (ref 80.0–100.0)
Platelets: 362 10*3/uL (ref 150–400)
RBC: 2.67 MIL/uL — ABNORMAL LOW (ref 3.87–5.11)
RDW: 15.2 % (ref 11.5–15.5)
WBC: 9.2 10*3/uL (ref 4.0–10.5)
nRBC: 0 % (ref 0.0–0.2)

## 2024-04-15 LAB — BASIC METABOLIC PANEL WITH GFR
Anion gap: 8 (ref 5–15)
BUN: 14 mg/dL (ref 8–23)
CO2: 22 mmol/L (ref 22–32)
Calcium: 8.8 mg/dL — ABNORMAL LOW (ref 8.9–10.3)
Chloride: 108 mmol/L (ref 98–111)
Creatinine, Ser: 0.69 mg/dL (ref 0.44–1.00)
GFR, Estimated: >60 mL/min (ref >60)
Glucose, Bld: 107 mg/dL — ABNORMAL HIGH (ref 70–99)
Potassium: 3.3 mmol/L — ABNORMAL LOW (ref 3.5–5.1)
Sodium: 138 mmol/L (ref 135–145)

## 2024-04-15 MED ORDER — POTASSIUM CHLORIDE CRYS ER 20 MEQ PO TBCR
40.0000 meq | EXTENDED_RELEASE_TABLET | Freq: Once | ORAL | Status: AC
  Administered 2024-04-15: 40 meq via ORAL
  Filled 2024-04-15: qty 2

## 2024-04-15 NOTE — Progress Notes (Signed)
 Occupational Therapy Weekly Progress Note  Patient Details  Name: [name] MRN: [id number] Date of Birth: Feb 15, 1930  Beginning of progress report period: Apr 10, 2024 End of progress report period: Apr 16, 2024  Session 1: {CHL IP REHAB OT TIME CALCULATIONS:304400400} Session 2:  {CHL IP REHAB OT TIME CALCULATIONS:304400400}   Patient has met 1 of 2 short term goals.  ***  Patient continues to demonstrate the following deficits: muscle weakness and acute pain, decreased cardiorespiratoy endurance, and decreased sitting balance, decreased standing balance, decreased postural control, and decreased balance strategies and therefore will continue to benefit from skilled OT intervention to enhance overall performance with BADL and Reduce care partner burden.  Patient progressing toward long term goals..  Continue plan of care.  OT Short Term Goals Week 1:  OT Short Term Goal 1 (Week 1): Pt will complete LB dressing and bathing with Max A while maintaining weightbearing status to RLE. OT Short Term Goal 1 - Progress (Week 1): Met OT Short Term Goal 2 (Week 1): Pt will complete 2/3 toileting steps while maintaining weightbearing status to RLE. OT Short Term Goal 2 - Progress (Week 1): Progressing toward goal Week 2:  OT Short Term Goal 1 (Week 2): STG = LTG (d/r ELOS)  Skilled Therapeutic Interventions/Progress Updates:  Session 1:   Patient agreeable to participate in OT session. Reports *** pain level.   Patient participated in skilled OT session focusing on ***. Therapist facilitated/assessed/developed/educated/integrated/elicited *** in order to improve/facilitate/promote   Session 2: Patient agreeable to participate in OT session. Reports *** pain level.   Patient participated in skilled OT session focusing on ***. Therapist facilitated/assessed/developed/educated/integrated/elicited *** in order to improve/facilitate/promote   Therapy Documentation Precautions:   Precautions Precautions: Fall Recall of Precautions/Restrictions: Impaired Restrictions Weight Bearing Restrictions Per Provider Order: Yes RLE Weight Bearing Per Provider Order: Touchdown weight bearing  Therapy/Group: Individual Therapy  Carollee Circle, OTR/L,CBIS  Supplemental OT - [facility] Secure Chat Preferred   04/15/2024, 4:48 PM

## 2024-04-15 NOTE — Progress Notes (Signed)
 PROGRESS NOTE   Subjective/Complaints:  LBM today.  Reports very frequent urination this morning.  She thinks she might have a UTI.  She reports she has urine needed 4 times in the last 30 minutes.   ROS:    Pt denies SOB, abd pain, CP, N/V/ (+)C/D, and vision changes  Chronic constipation (+)  + Chronic stress incontinence-worsened today  Objective:   No results found.  Recent Labs    04/15/24 0603  WBC 9.2  HGB 7.9*  HCT 24.5*  PLT 362    Recent Labs    04/15/24 0603  NA 138  K 3.3*  CL 108  CO2 22  GLUCOSE 107*  BUN 14  CREATININE 0.69  CALCIUM  8.8*     Intake/Output Summary (Last 24 hours) at 04/15/2024 1010 Last data filed at 04/15/2024 0818 Gross per 24 hour  Intake 460 ml  Output 800 ml  Net -340 ml        Physical Exam: Vital Signs Blood pressure 134/65, pulse 74, temperature 98.2 F (36.8 C), temperature source Oral, resp. rate 18, height 5\' 10"  (1.778 m), weight 109.1 kg, SpO2 100%.      General: awake, alert, appropriate, lying in bed, NAD HENT: conjugate gaze; oropharynx moist CV: regular rate and rhythm Pulmonary: CTA B/L; no W/R/R- good air movement GI: soft, NT, ND, (+)BS Psychiatric: appropriate, pleasant Neurological: Ox3   Extremities: Trace bilateral lower extremity swelling.   Right hip incision with dressing CDI, appropriately tender.- looks about the same Psych: Very pleasant Skin: Clean and intact without signs of breakdown Neuro:    Mental Status: AAOx3 Speech/Languate: fluent CRANIAL NERVES: Grossly intact  Follows full commands. Decreased to light touch in hands B/L- not rest of Ue's- B/L as well as L1 to S2 B/L- worse in LE's- more sensory deficits  Ue's 5-/5 LLE 5-/5 throughout RLE_ HF 3/5; KE 5-/5 and DF/PF 5-/5- limited by pain when testing MSK strength   MSK: R hip moderate swelling    Assessment/Plan: 1. Functional deficits which require  3+ hours per day of interdisciplinary therapy in a comprehensive inpatient rehab setting. Physiatrist is providing close team supervision and 24 hour management of active medical problems listed below. Physiatrist and rehab team continue to assess barriers to discharge/monitor patient progress toward functional and medical goals  Care Tool:  Bathing    Body parts bathed by patient: Right arm, Left arm, Chest, Abdomen, Right upper leg, Left upper leg, Face   Body parts bathed by helper: Front perineal area, Buttocks, Right lower leg, Left lower leg     Bathing assist Assist Level: Moderate Assistance - Patient 50 - 74%     Upper Body Dressing/Undressing Upper body dressing   What is the patient wearing?: Pull over shirt    Upper body assist Assist Level: Set up assist    Lower Body Dressing/Undressing Lower body dressing      What is the patient wearing?: Incontinence brief, Pants     Lower body assist Assist for lower body dressing: Dependent - Patient 0%     Toileting Toileting    Toileting assist Assist for toileting: Dependent - Patient 0%  Transfers Chair/bed transfer  Transfers assist     Chair/bed transfer assist level: Moderate Assistance - Patient 50 - 74%     Locomotion Ambulation   Ambulation assist   Ambulation activity did not occur: Safety/medical concerns (pain/fatigue)          Walk 10 feet activity   Assist  Walk 10 feet activity did not occur: Safety/medical concerns        Walk 50 feet activity   Assist Walk 50 feet with 2 turns activity did not occur: Safety/medical concerns         Walk 150 feet activity   Assist Walk 150 feet activity did not occur: Safety/medical concerns         Walk 10 feet on uneven surface  activity   Assist Walk 10 feet on uneven surfaces activity did not occur: Safety/medical concerns         Wheelchair     Assist Is the patient using a wheelchair?: Yes Type of  Wheelchair: Manual    Wheelchair assist level: Supervision/Verbal cueing Max wheelchair distance: 120    Wheelchair 50 feet with 2 turns activity    Assist        Assist Level: Supervision/Verbal cueing   Wheelchair 150 feet activity     Assist      Assist Level: Dependent - Patient 0%   Blood pressure 134/65, pulse 74, temperature 98.2 F (36.8 C), temperature source Oral, resp. rate 18, height 5\' 10"  (1.778 m), weight 109.1 kg, SpO2 100%.   Medical Problem List and Plan: 1. Functional deficits secondary to right supracondylar periprosthetic distal femur fracture.  Status post open reduction internal fixation 04/04/2024 per Dr. Guyann Leitz. TDWB right lower extremity for 4-6 weeks             -patient may  shower             -ELOS/Goals: 12-14 days Supervision to min A             Con't CIR 2.  Antithrombotics: -DVT/anticoagulation:  Pharmaceutical: Eliquis .  Venous Doppler study negative.             -antiplatelet therapy: N/A 3. Pain Management: Lyrica  25 mg BID,, oxycodone  as needed- more pain from peripheral neuropathy than her RLE- mainly L ankle per pt  -5/8 occasional use of oxycodone , continue current regimen  - 5/9 pain improving infrequent use of as needed oxycodone   5/10-5/12 pt reports pain usually controlled with meds 4. Mood/Behavior/Sleep: Zoloft  50 mg daily, trazodone  150 mg nightly.  Provide emotional support  5/10- needed help to deal with grief of being in hospital right now             -antipsychotic agents: N/A 5. Neuropsych/cognition: This patient is capable of making decisions on her own behalf. 6. Skin/Wound Care: Routine skin checks 7. Fluids/Electrolytes/Nutrition: Routine in and outs with follow-up chemistries 8.  Acute blood loss anemia as well as history of normocytic anemia.  Continue iron supplement.  Follow-up CBC  - 5/7 HGB stable 8.4  -5/12 hemoglobin a little lower at 7.9, check stool blood test 9.  Transient atrial fibrillation with  RVR Lopressor  25 mg twice daily.  Follow-up cardiology services.  Continue Eliquis  until further notice.. 10.  Obesity.  BMI 30.85.  Dietary follow-up  5/10- BMI up to 34.51- will follow weight because last weight was significantly less  11.  Acute on Chronic Constipation.  MiraLAX  twice daily, Senokot daily, Colace 100 mg daily.  -5/7  improved after bowel movement today  -5/9 LBM today, improved.  Decrease MiraLAX  frequency to daily  5/10- LBM yesterday  5/11- Will add Senna 2 tabs daily and miralax  daily prn along with daily scheduled due to constipation today and cannot go   5/12 LBM today, improved continue to monitor 12.AKI.Resolved.Follow up chemestries  5/7 BUN/Cr stable, continue to monitor  5/12 creatinine BUN stable at 0.69/14 13. Hypokalemia  -5/12  K+ today 14. Frequent urination  -5/12 U/A/ culture ordered     LOS: 6 days A FACE TO FACE EVALUATION WAS PERFORMED  Lylia Sand 04/15/2024, 10:10 AM

## 2024-04-15 NOTE — Progress Notes (Signed)
 Physical Therapy Session Note  Patient Details  Name: [name] MRN: [id number] Date of Birth: August 22, 1930  Today's Date: 04/15/2024 PT Individual Time: 1354-1500 PT Individual Time Calculation (min): 66 min   Short Term Goals: Week 1:  PT Short Term Goal 1 (Week 1): pt will perform bed mobility with min A or less with LRAD PT Short Term Goal 2 (Week 1): pt will perform sit to stand with LRAD and min A PT Short Term Goal 3 (Week 1): pt will perform bed to chiar transfer with LRAD and mod A or less PT Short Term Goal 4 (Week 1): pt will initiate gait training with LRAD  Skilled Therapeutic Interventions/Progress Updates:      Pt supine in ebd upon arrival. Pt agreeable to therapy. Pt denies any pain.   Pt reports need to use bathroom.   Pt performed supine to sit with use of bed features and supervision.   Pt performed level  lateral scoot transfer elevated bed to [facility] with min A, verbal cues provided for technique sequencing. Pt donned pants with use of lateral trunk lean technique and assist. Verbal cues provided for technique. Pt performed pericare while seated with close supervision, verabal cues provided for technque.   Pt performed sit to stand with mod A from [facility] with RW, verbal and tactiel cues proided for technique and UE positioning.  Pt requires heavy UE support on RW 2/2 lower surface. Pt donned pants over buttocks with total A for safety.    Pt performed lateral scoot BSC to bed with min A emphasis on head hip ratio.   Pt performed lateral scoot bed to WC with min A, verbal cues provided for anterior weight shift verus posterior weight shift.   Pt self propelled WC room to main gym with B UE and mod I.   Pt demos improved sit to stand throughout remainder of session with use of B UE support on arm rests of WC. Education provided to pt to power up with B UE on arm rests of WC/BSC versus on RW. Pt verbalized understanding and agreeable.   Pt ambulated 3x~5 feet with  RW and min A with +2 for WC follow, verbal cues provided for sequencing and management of RW. pt reqired seated rest break 2/2 fatigue/SOB/shakiness.   Pt requesting to sit in recliner at end of session. Increased time spent rearranging furniture per pt request for easier access to recliner.   Pt performed stand pivot transfer with RW and min A with verbal/tactile cues provided for B LE positioning as pt demos little to no clearance of L LE and pt crossing her legs.   Pt seated in recliner with all needs within reach and chair alarm on.   Therapy Documentation Precautions:  Precautions Precautions: Fall Recall of Precautions/Restrictions: Impaired Restrictions Weight Bearing Restrictions Per Provider Order: Yes RLE Weight Bearing Per Provider Order: Touchdown weight bearing   Therapy/Group: Individual Therapy  [facility] [place], [place], DPT\ 04/15/2024, 2:06 PM

## 2024-04-15 NOTE — Group Note (Signed)
 Patient Details Name: ABERNATHY SAFFOLD MRN: [id number] DOB: [birth year] Today's Date: 04/15/2024  Time Calculation: OT Group Time Calculation OT Group Start Time: 1105 OT Group Stop Time: 1205 OT Group Time Calculation (min): 60 min      Group Description: w/c group: Description: Pt participated in UE/Wheelchair skills group to focus on functional endurance, BUE strengthening, w/c mobility training and w/c parts management re-training in a social setting. Education provided and return demonstration of management of legrests, brakes, and armrests on w/c. Education provided on energy conservation, efficient propulsion technique, and proper positioning for shoulder preservation.   Individual level documentation: Pts first engaged in BUE warmup to promote cardiovascular endurance and facilitate improved global strength and endurance through GM movement and stretching.  Pt engaged in group activity of "wheelchair trivia" where pts had to answer various w/c questions such as: How do you take on off your leg rests? How long should you pressure relief? How often should you pressure relief? How do you adjust your arm rests? What are some pressure relief techniques? What is one thing you should do to your w/c before getting up? When should you take you leg rests off? How do you know if your leg rests are locked in? How do you lift/lower your leg rests? Pt unable to remove leg rests d/t neuropathy but did apply textured tape to allow pt to continue to practice.   Pts then engaged in w/c obstacle course with pts instructed to complete w/c propulsion through cones to simulate maneuvering w/c in tight places. During obstacle course, pts also had to utilize reacher to pick up items from floor level. Pt completed course with overall supervision but MIN verbal cues to remember to lock brakes.  While one pt was engaging in obstacle course, other pts were completing pressure relief strategies.  Pts  completed additional obstacle course in hallway with moving targets with pts instructed to collect as many ADL items as possible while maintaining safe w/c technique. Pt completed task with supervision.   Pts were transported back to rooms by RT.   Pain: No pain   Precautions:    Mollie Anger [facility] 04/15/2024, 12:33 PM

## 2024-04-15 NOTE — Plan of Care (Signed)
  Problem: Consults Goal: RH GENERAL PATIENT EDUCATION Description: See Patient Education module for education specifics. Outcome: Progressing   Problem: RH BOWEL ELIMINATION Goal: RH STG MANAGE BOWEL WITH ASSISTANCE Description: STG Manage Bowel with mod I Assistance. Outcome: Progressing Goal: RH STG MANAGE BOWEL W/MEDICATION W/ASSISTANCE Description: STG Manage Bowel with Medication with mod I Assistance. Outcome: Progressing   Problem: RH BLADDER ELIMINATION Goal: RH STG MANAGE BLADDER WITH ASSISTANCE Description: STG Manage Bladder With toileting Assistance Outcome: Progressing   Problem: RH SKIN INTEGRITY Goal: RH STG SKIN FREE OF INFECTION/BREAKDOWN Description: Manage w min assist Outcome: Progressing   Problem: RH SAFETY Goal: RH STG ADHERE TO SAFETY PRECAUTIONS W/ASSISTANCE/DEVICE Description: STG Adhere to Safety Precautions With cues Assistance/Device. Outcome: Progressing   Problem: RH PAIN MANAGEMENT Goal: RH STG PAIN MANAGED AT OR BELOW PT'S PAIN GOAL Description: < 4 with prns Outcome: Progressing   Problem: RH KNOWLEDGE DEFICIT GENERAL Goal: RH STG INCREASE KNOWLEDGE OF SELF CARE AFTER HOSPITALIZATION Description: Patient and family will be able to manage care at discharge using educational resources for medications, skin care, etc independently Outcome: Progressing

## 2024-04-16 LAB — URINALYSIS, W/ REFLEX TO CULTURE (INFECTION SUSPECTED)
Bacteria, UA: NONE SEEN
Bilirubin Urine: NEGATIVE
Glucose, UA: NEGATIVE mg/dL
Hgb urine dipstick: NEGATIVE
Ketones, ur: NEGATIVE mg/dL
Leukocytes,Ua: NEGATIVE
Nitrite: NEGATIVE
Protein, ur: NEGATIVE mg/dL
Specific Gravity, Urine: 1.013 (ref 1.005–1.030)
pH: 6 (ref 5.0–8.0)

## 2024-04-16 LAB — OCCULT BLOOD X 1 CARD TO LAB, STOOL: Fecal Occult Bld: NEGATIVE

## 2024-04-16 NOTE — Patient Instructions (Signed)
 [name]

## 2024-04-16 NOTE — Progress Notes (Signed)
 Physical Therapy Session Note  Patient Details  Name: [name] MRN: [id number] Date of Birth: 07-17-1930  Today's Date: 04/16/2024 PT Individual Time: 0900-1015 PT Individual Time Calculation (min): 75 min   Short Term Goals: Week 1:  PT Short Term Goal 1 (Week 1): pt will perform bed mobility with min A or less with LRAD PT Short Term Goal 2 (Week 1): pt will perform sit to stand with LRAD and min A PT Short Term Goal 3 (Week 1): pt will perform bed to chiar transfer with LRAD and mod A or less PT Short Term Goal 4 (Week 1): pt will initiate gait training with LRAD  Skilled Therapeutic Interventions/Progress Updates:      Pt seated in recliner upon arrival. Pt agreeabel to therapy. Pt denies any pain.   Therapist provided demonstration of stand pivot transfer technique/sequencing for improved mechanics and safety to reduce fall risk with emphasis on not crossing legs. Pt returned demonstration recliner to WC with RW and mod A for power up and min A for pivot with RW and increased time.   Pt performed stand pivot transfer WC to car simulator with RW and mod A 2/2 pt attempting to sit prematurely, verbal cues provided for safety with emphasis on not sitting into B LE touching surface of car.   Pt performed stand pivot transfer WC to rehab apartment with min A with RW. Pt performed bed mobility on apartment bed with supervision and increased time with use of L LE to assist R LE on/off bed.   Discussed home measurement sheet. Pt able to access the house via wheelchair with the exception of the bathroom.   Pt performed stand pivot transfer WC to BSC, and BSC to recliner with RW and min A, pt demos improved carry over of technique with repetition. Pt continent of bowel and bladder-notified nurse of stool sample. Pt performed pericare and donning/doffing pants with total A 2/2 time constraints.   Pt seated in recliner with all needs within reach and chair alarm on at end of session.        Therapy Documentation Precautions:  Precautions Precautions: Fall Recall of Precautions/Restrictions: Impaired Restrictions Weight Bearing Restrictions Per Provider Order: Yes RLE Weight Bearing Per Provider Order: Touchdown weight bearing   Therapy/Group: Individual Therapy  [facility] [place], [place], DPT  04/16/2024, 9:07 AM

## 2024-04-16 NOTE — Progress Notes (Signed)
 PROGRESS NOTE   Subjective/Complaints:  Patient reports she is doing well this morning.  She asked when her sutures come out.  LBM today 5/13  ROS:    Pt denies SOB, abd pain, CP, N/V/ (+)C/D, and vision changes  Chronic constipation (+)  + Chronic stress incontinence-improved today, back to baseline  Objective:   No results found.  Recent Labs    04/15/24 0603  WBC 9.2  HGB 7.9*  HCT 24.5*  PLT 362    Recent Labs    04/15/24 0603  NA 138  K 3.3*  CL 108  CO2 22  GLUCOSE 107*  BUN 14  CREATININE 0.69  CALCIUM  8.8*     Intake/Output Summary (Last 24 hours) at 04/16/2024 1438 Last data filed at 04/16/2024 1307 Gross per 24 hour  Intake 720 ml  Output 100 ml  Net 620 ml        Physical Exam: Vital Signs Blood pressure 126/66, pulse 75, temperature 98.7 F (37.1 C), temperature source Oral, resp. rate 16, height 5\' 10"  (1.778 m), weight 109.1 kg, SpO2 100%.      General: awake, alert, appropriate, lying in bed, NAD HENT: [name], AT, conjugate gaze; oropharynx moist CV: regular rate and rhythm Pulmonary: Clear to auscultation bilaterally, nonlabored breathing GI: soft, NT, ND, (+)BS Psychiatric: appropriate, pleasant Neurological: Ox3   Extremities: Trace bilateral lower extremity swelling.   Right hip incision with dressing CDI, appropriately tender.- looks about the same Psych: Very pleasant Skin: Clean and intact without signs of breakdown Neuro:    Mental Status: AAOx3, follows commands Speech/Languate: fluent CRANIAL NERVES: Grossly intact  Follows full commands. Decreased to light touch in hands B/L- not rest of Ue's- B/L as well as L1 to S2 B/L- worse in LE's- more sensory deficits  Ue's 5-/5 LLE 5-/5 throughout RLE_ HF 3/5; KE 5-/5 and DF/PF 5-/5- limited by pain when testing MSK strength   MSK: R hip moderate swelling    Assessment/Plan: 1. Functional deficits which  require 3+ hours per day of interdisciplinary therapy in a comprehensive inpatient rehab setting. Physiatrist is providing close team supervision and 24 hour management of active medical problems listed below. Physiatrist and rehab team continue to assess barriers to discharge/monitor patient progress toward functional and medical goals  Care Tool:  Bathing    Body parts bathed by patient: Right arm, Left arm, Chest, Abdomen, Right upper leg, Left upper leg, Face, Front perineal area, Right lower leg, Left lower leg   Body parts bathed by helper: Buttocks     Bathing assist Assist Level: Minimal Assistance - Patient > 75%     Upper Body Dressing/Undressing Upper body dressing   What is the patient wearing?: Bra, Pull over shirt    Upper body assist Assist Level: Minimal Assistance - Patient > 75%    Lower Body Dressing/Undressing Lower body dressing      What is the patient wearing?: Incontinence brief, Pants     Lower body assist Assist for lower body dressing: Moderate Assistance - Patient 50 - 74%     Toileting Toileting    Toileting assist Assist for toileting: Total Assistance - Patient < 25%  Transfers Chair/bed transfer  Transfers assist     Chair/bed transfer assist level: Dependent - mechanical lift     Locomotion Ambulation   Ambulation assist   Ambulation activity did not occur: Safety/medical concerns (pain/fatigue)          Walk 10 feet activity   Assist  Walk 10 feet activity did not occur: Safety/medical concerns        Walk 50 feet activity   Assist Walk 50 feet with 2 turns activity did not occur: Safety/medical concerns         Walk 150 feet activity   Assist Walk 150 feet activity did not occur: Safety/medical concerns         Walk 10 feet on uneven surface  activity   Assist Walk 10 feet on uneven surfaces activity did not occur: Safety/medical concerns         Wheelchair     Assist Is the  patient using a wheelchair?: Yes Type of Wheelchair: Manual    Wheelchair assist level: Supervision/Verbal cueing Max wheelchair distance: 120    Wheelchair 50 feet with 2 turns activity    Assist        Assist Level: Supervision/Verbal cueing   Wheelchair 150 feet activity     Assist      Assist Level: Dependent - Patient 0%   Blood pressure 126/66, pulse 75, temperature 98.7 F (37.1 C), temperature source Oral, resp. rate 16, height 5\' 10"  (1.778 m), weight 109.1 kg, SpO2 100%.   Medical Problem List and Plan: 1. Functional deficits secondary to right supracondylar periprosthetic distal femur fracture.  Status post open reduction internal fixation 04/04/2024 per Dr. Guyann Leitz. TDWB right lower extremity for 4-6 weeks             -patient may  shower             -ELOS/Goals: 12-14 days Supervision to min A             Con't CIR  - Dr. Guyann Leitz op note indicates plan for suture removal about 2 weeks and repeat x-rays- will check with Dr. Guyann Leitz 2.  Antithrombotics: -DVT/anticoagulation:  Pharmaceutical: Eliquis .  Venous Doppler study negative.             -antiplatelet therapy: N/A 3. Pain Management: Lyrica  25 mg BID,, oxycodone  as needed- more pain from peripheral neuropathy than her RLE- mainly L ankle per pt  -5/8 occasional use of oxycodone , continue current regimen  - 5/9 pain improving infrequent use of as needed oxycodone   5/13 pain controlled, not needing frequent as needed meds 4. Mood/Behavior/Sleep: Zoloft  50 mg daily, trazodone  150 mg nightly.  Provide emotional support  5/10- needed help to deal with grief of being in hospital right now             -antipsychotic agents: N/A 5. Neuropsych/cognition: This patient is capable of making decisions on her own behalf. 6. Skin/Wound Care: Routine skin checks 7. Fluids/Electrolytes/Nutrition: Routine in and outs with follow-up chemistries 8.  Acute blood loss anemia as well as history of normocytic anemia.  Continue  iron supplement.  Follow-up CBC  - 5/7 HGB stable 8.4  -5/12 hemoglobin a little lower at 7.9, check stool blood test-pending  Recheck thursday 9.  Transient atrial fibrillation with RVR Lopressor  25 mg twice daily.  Follow-up cardiology services.  Continue Eliquis  until further notice.. 10.  Obesity.  BMI 30.85.  Dietary follow-up  5/10- BMI up to 34.51- will follow weight because last  weight was significantly less  11.  Acute on Chronic Constipation.  MiraLAX  twice daily, Senokot daily, Colace 100 mg daily.  -5/7 improved after bowel movement today  -5/9 LBM today, improved.  Decrease MiraLAX  frequency to daily  5/10- LBM yesterday  5/11- Will add Senna 2 tabs daily and miralax  daily prn along with daily scheduled due to constipation today and cannot go   5/12 LBM today, improved continue to monitor  LBM 5/13 continue to monitor  12.AKI.Resolved.Follow up chemestries  5/7 BUN/Cr stable, continue to monitor  5/12 creatinine BUN stable at 0.69/14 13. Hypokalemia  -5/12  K+ today  Recheck thursday 14. Frequent urination  -5/12 U/A negative, symptoms have improved     LOS: 7 days A FACE TO FACE EVALUATION WAS PERFORMED  Lylia Sand 04/16/2024, 2:38 PM

## 2024-04-16 NOTE — Progress Notes (Signed)
 Occupational Therapy Session Note  Patient Details  Name: [name] MRN: [id number] Date of Birth: [birth year]  {CHL IP REHAB OT TIME CALCULATIONS:[id number]}   Short Term Goals: Week 2:  OT Short Term Goal 1 (Week 2): STG = LTG (d/r ELOS)  Skilled Therapeutic Interventions/Progress Updates:    Patient agreeable to participate in OT session. Reports *** pain level.   Patient participated in skilled OT session focusing on ***. Therapist facilitated/assessed/developed/educated/integrated/elicited *** in order to improve/facilitate/promote    Therapy Documentation Precautions:  Precautions Precautions: Fall Recall of Precautions/Restrictions: Impaired Restrictions Weight Bearing Restrictions Per Provider Order: Yes RLE Weight Bearing Per Provider Order: Touchdown weight bearing  Therapy/Group: Individual Therapy  Carollee Circle, OTR/L,CBIS  Supplemental OT - [facility] and [facility] Secure Chat Preferred   04/16/2024, 4:20 PM

## 2024-04-17 ENCOUNTER — Inpatient Hospital Stay (HOSPITAL_COMMUNITY)

## 2024-04-17 MED ORDER — ACETAMINOPHEN 325 MG PO TABS: 325.0000 mg | ORAL_TABLET | ORAL | Status: AC | PRN

## 2024-04-17 MED ORDER — POLYETHYLENE GLYCOL 3350 17 G PO PACK: 17.0000 g | PACK | Freq: Every day | ORAL | Status: AC | PRN

## 2024-04-17 NOTE — Patient Care Conference (Signed)
 Inpatient RehabilitationTeam Conference and Plan of Care Update Date: 04/17/2024   Time: 12:09 PM    Patient Name: [name]      Medical Record Number: [id number]  Date of Birth: 1930-10-01 Sex: Female         Room/Bed: 4M11C/4M11C-01 Payor Info: Payor: MEDICARE / Plan: MEDICARE PART A AND B / Product Type: *No Product type* /    Admit Date/Time:  04/09/2024  2:20 PM  Primary Diagnosis:  Femur fracture [facility])  Hospital Problems: Principal Problem:   Femur fracture [facility])    Expected Discharge Date: Expected Discharge Date: 04/24/24  Team Members Present: Physician leading conference: Dr. Lylia Sand Social Worker Present: Adrianna Albee, LCSW Nurse Present: Forrestine Ike, RN PT Present: Jenney Modest, PT OT Present: Carollee Circle, OT PPS Coordinator present : Jestine Moron, SLP     Current Status/Progress Goal Weekly Team Focus  Bowel/Bladder   Pt is continent of bowel/bladder   Pt will remain continent of bowel/bladder   Will assess qshift and PRN    Swallow/Nutrition/ Hydration               ADL's   UB SBA, LB Max A, Min -Mod A for power up to stand with RW then CGA for step pivot transfer.   SBA-Min A   sit to stand, functional transfers with use of RW (consistency), energy conservation    Mobility   bed mobility: supervision, sit to stand min-lightmod A (recliner), stand pivot transfer with RW and min-light mod A, car transfer with mod A, ambulation x8 feet with +2 min A for WC follow, WC mobility mod I   sup/min A, downgraded ambualtion goal to 10 feet with CGA  D/C 5/21 , follow HHPT, DME: pt needs WC 20x18, pt has RW, need to schedule family training    Communication                Safety/Cognition/ Behavioral Observations               Pain   Pt denies pain   Pt will continue to deny pain   Will assess qshift and PRN    Skin   Pt has surgical site to right leg   Pt's surgical site will continue to heal  Will assess qshift  and PRN      Discharge Planning:  Unsure of plan for home due to son can not do 24/7 physical care. Pursuing DC options   Team Discussion: Patient post femur fracture.  Patient on target to meet rehab goals: yes, currently needs mod assist to complete sit - stand and stand pivot transfers. Needs heavy mod assist for car transfers. Able to ambulate up to 8' with +2 min assist. Goals for discharge set for CGA - supervision overall.  *See Care Plan and progress notes for long and short-term goals.   Revisions to Treatment Plan:  Ortho consult for suture removal date   Teaching Needs: Safety, medications, transfers, toileting, etc.   Current Barriers to Discharge: Decreased caregiver support, Home enviroment access/layout, and Weight bearing restrictions  Possible Resolutions to Barriers: Family education HH follow up services DME: W/C, BSC  Family looking into availability of ALF     Medical Summary Current Status: femur fx, pain, abla, aki, hypokalemia, urination frequent, constipation  Barriers to Discharge: Incontinence;Medical stability;Self-care education  Barriers to Discharge Comments: femur fx, pain, abla, aki, hypokalemia, urination frequent, constipation Possible Resolutions to [employer] Focus: discussed suture removal/xray repeat otho, continue to monitor  CBC/BMP, monitor bowel function   Continued Need for Acute Rehabilitation Level of Care: The patient requires daily medical management by a physician with specialized training in physical medicine and rehabilitation for the following reasons: Direction of a multidisciplinary physical rehabilitation program to maximize functional independence : Yes Medical management of patient stability for increased activity during participation in an intensive rehabilitation regime.: Yes Analysis of laboratory values and/or radiology reports with any subsequent need for medication adjustment and/or medical intervention. :  Yes   I attest that I was present, lead the team conference, and concur with the assessment and plan of the team.   Forrestine Ike B 04/17/2024, 2:54 PM

## 2024-04-17 NOTE — Progress Notes (Signed)
 Patient ID: [name], female   DOB: 21-Jun-1930, 89 y.o.   MRN: [id number]  Met with pt and had frank-son via speaker phone to give team conference update with goals of supervision-min assist wheelchair level and target discharge date of 5/21. Discussed options of SNF, ALF and hiring assist. Pt wants to use her insurance and pursue [facility] if can get a bed and go there. Aware [employer] of [employer] has reached out to worker and asked Dc date. [facility]ck reports have spoken with them and pt and son made aware ALF is private pay. Will begin working on going to [facility]

## 2024-04-17 NOTE — Progress Notes (Signed)
 PROGRESS NOTE   Subjective/Complaints:  No new concerns this morning.  Pain is under control.  LBM today 5/14  ROS:    Pt denies SOB, abd pain, CP, N/V/ (+)C/D, and vision changes  Chronic constipation -improved  + Chronic stress incontinence-improved today, back to baseline  Objective:   No results found.  Recent Labs    04/15/24 0603  WBC 9.2  HGB 7.9*  HCT 24.5*  PLT 362    Recent Labs    04/15/24 0603  NA 138  K 3.3*  CL 108  CO2 22  GLUCOSE 107*  BUN 14  CREATININE 0.69  CALCIUM  8.8*     Intake/Output Summary (Last 24 hours) at 04/17/2024 1058 Last data filed at 04/17/2024 0739 Gross per 24 hour  Intake 1080 ml  Output 850 ml  Net 230 ml        Physical Exam: Vital Signs Blood pressure (!) 142/82, pulse 75, temperature 98.3 F (36.8 C), temperature source Oral, resp. rate 18, height 5\' 10"  (1.778 m), weight 109 kg, SpO2 98%.      General: awake, alert, appropriate, lying in bed, NAD HENT: [place], AT, conjugate gaze; oropharynx moist CV: RRR Pulmonary: Clear to auscultation bilaterally, nonlabored breathing, on RA GI: soft, NT, ND, (+)BS Psychiatric: appropriate, pleasant Neurological: Ox3   Extremities: Trace bilateral lower extremity swelling.   Right hip incision with dressing CDI, appropriately tender  Skin: Clean and intact without signs of breakdown Neuro:    Mental Status: AAOx3, follows commands Speech/Languate: fluent CRANIAL NERVES: Grossly intact  Follows full commands. Decreased to light touch in hands B/L- not rest of Ue's- B/L as well as L1 to S2 B/L- worse in LE's- more sensory deficits  Ue's 5-/5 LLE 5-/5 throughout RLE_ HF 4-/5; KE 5-/5 and DF/PF 5-/5- limited by pain when testing MSK strength   MSK: R hip moderate swelling    Assessment/Plan: 1. Functional deficits which require 3+ hours per day of interdisciplinary therapy in a comprehensive  inpatient rehab setting. Physiatrist is providing close team supervision and 24 hour management of active medical problems listed below. Physiatrist and rehab team continue to assess barriers to discharge/monitor patient progress toward functional and medical goals  Care Tool:  Bathing    Body parts bathed by patient: Right arm, Left arm, Chest, Abdomen, Right upper leg, Left upper leg, Face, Front perineal area, Right lower leg, Left lower leg   Body parts bathed by helper: Buttocks     Bathing assist Assist Level: Minimal Assistance - Patient > 75%     Upper Body Dressing/Undressing Upper body dressing   What is the patient wearing?: Bra, Pull over shirt    Upper body assist Assist Level: Minimal Assistance - Patient > 75%    Lower Body Dressing/Undressing Lower body dressing      What is the patient wearing?: Incontinence brief, Pants     Lower body assist Assist for lower body dressing: Moderate Assistance - Patient 50 - 74%     Toileting Toileting    Toileting assist Assist for toileting: Total Assistance - Patient < 25%     Transfers Chair/bed transfer  Transfers assist  Chair/bed transfer assist level: Minimal Assistance - Patient > 75%     Locomotion Ambulation   Ambulation assist   Ambulation activity did not occur: Safety/medical concerns (pain/fatigue)  Assist level: 2 helpers Assistive device: Walker-rolling Max distance: 8   Walk 10 feet activity   Assist  Walk 10 feet activity did not occur: Safety/medical concerns (pain/fatigue)        Walk 50 feet activity   Assist Walk 50 feet with 2 turns activity did not occur: Safety/medical concerns         Walk 150 feet activity   Assist Walk 150 feet activity did not occur: Safety/medical concerns         Walk 10 feet on uneven surface  activity   Assist Walk 10 feet on uneven surfaces activity did not occur: Safety/medical concerns          Wheelchair     Assist Is the patient using a wheelchair?: Yes Type of Wheelchair: Manual    Wheelchair assist level: Independent Max wheelchair distance: 150    Wheelchair 50 feet with 2 turns activity    Assist        Assist Level: Independent   Wheelchair 150 feet activity     Assist      Assist Level: Independent   Blood pressure (!) 142/82, pulse 75, temperature 98.3 F (36.8 C), temperature source Oral, resp. rate 18, height 5\' 10"  (1.778 m), weight 109 kg, SpO2 98%.   Medical Problem List and Plan: 1. Functional deficits secondary to right supracondylar periprosthetic distal femur fracture.  Status post open reduction internal fixation 04/04/2024 per Dr. Guyann Leitz. TDWB right lower extremity for 4-6 weeks             -patient may  shower             -ELOS/Goals: 12-14 days Supervision to min A             Con't CIR  - Dr. Guyann Leitz op note indicates plan for suture removal about 2 weeks and repeat x-rays-discussed with [name] PA will check on this  -Team conference today please see physician documentation under team conference tab, met with team  to discuss problems,progress, and goals. Formulized individual treatment plan based on medical history, underlying problem and comorbidities.   2.  Antithrombotics: -DVT/anticoagulation:  Pharmaceutical: Eliquis .  Venous Doppler study negative.             -antiplatelet therapy: N/A 3. Pain Management: Lyrica  25 mg BID,, oxycodone  as needed- more pain from peripheral neuropathy than her RLE- mainly L ankle per pt  -5/8 occasional use of oxycodone , continue current regimen  - 5/9 pain improving infrequent use of as needed oxycodone   5/13-14 pain controlled, not needing frequent as needed meds 4. Mood/Behavior/Sleep: Zoloft  50 mg daily, trazodone  150 mg nightly.  Provide emotional support  5/10- needed help to deal with grief of being in hospital right now             -antipsychotic agents: N/A 5.  Neuropsych/cognition: This patient is capable of making decisions on her own behalf. 6. Skin/Wound Care: Routine skin checks 7. Fluids/Electrolytes/Nutrition: Routine in and outs with follow-up chemistries 8.  Acute blood loss anemia as well as history of normocytic anemia.  Continue iron supplement.  Follow-up CBC  - 5/7 HGB stable 8.4  -5/12 hemoglobin a little lower at 7.9, check stool blood test-pending  Recheck labs tomorrow  9.  Transient atrial fibrillation with RVR Lopressor  25  mg twice daily.  Follow-up cardiology services.  Continue Eliquis  until further notice.. 10.  Obesity.  BMI 30.85.  Dietary follow-up  5/10- BMI up to 34.51- will follow weight because last weight was significantly less  11.  Acute on Chronic Constipation.  MiraLAX  twice daily, Senokot daily, Colace 100 mg daily.  -5/7 improved after bowel movement today  -5/9 LBM today, improved.  Decrease MiraLAX  frequency to daily  5/10- LBM yesterday  5/11- Will add Senna 2 tabs daily and miralax  daily prn along with daily scheduled due to constipation today and cannot go   5/12 LBM today, improved continue to monitor  LBM 5/14 continue to monitor  12.AKI.Resolved.Follow up chemestries  5/7 BUN/Cr stable, continue to monitor  5/12 creatinine BUN stable at 0.69/14 13. Hypokalemia  -5/12  K+ today  Check labs tomorrow 14. Frequent urination  -5/12 U/A negative, symptoms have improved     LOS: 8 days A FACE TO FACE EVALUATION WAS PERFORMED  Lylia Sand 04/17/2024, 10:58 AM

## 2024-04-17 NOTE — Plan of Care (Signed)
  Problem: RH Ambulation Goal: LTG Patient will ambulate in controlled environment (PT) Description: LTG: Patient will ambulate in a controlled environment, # of feet with assistance (PT). Flowsheets (Taken 04/17/2024 0802) LTG: Pt will ambulate in controlled environ  assist needed:: Supervision/Verbal cueing LTG: Ambulation distance in controlled environment: 10 feet with LRAD Goal: LTG Patient will ambulate in home environment (PT) Description: LTG: Patient will ambulate in home environment, # of feet with assistance (PT). Flowsheets (Taken 04/17/2024 0802) LTG: Pt will ambulate in home environ  assist needed:: Contact Guard/Touching assist LTG: Ambulation distance in home environment: 10 feet with LRAD for entry into bathroom

## 2024-04-17 NOTE — Progress Notes (Signed)
 Occupational Therapy Session Note  Patient Details  Name: [name] MRN: [id number] Date of Birth: 10/15/1930  {CHL IP REHAB OT TIME CALCULATIONS:[id number]}   Short Term Goals: Week 2:  OT Short Term Goal 1 (Week 2): STG = LTG (d/r ELOS)  Skilled Therapeutic Interventions/Progress Updates:    Patient agreeable to participate in OT session. Reports *** pain level.   Patient participated in skilled OT session focusing on ***. Therapist facilitated/assessed/developed/educated/integrated/elicited *** in order to improve/facilitate/promote    Therapy Documentation Precautions:  Precautions Precautions: Fall Recall of Precautions/Restrictions: Impaired Restrictions Weight Bearing Restrictions Per Provider Order: Yes RLE Weight Bearing Per Provider Order: Touchdown weight bearing   Therapy/Group: Individual Therapy  Carollee Circle, OTR/L,CBIS  Supplemental OT - [facility] and [facility] Secure Chat Preferred   04/17/2024, 9:13 PM

## 2024-04-17 NOTE — Progress Notes (Signed)
 Physical Therapy Weekly Progress Note  Patient Details  Name: [name] MRN: [id number] Date of Birth: 07/14/1930  Beginning of progress report period: Apr 10, 2024 End of progress report period: Apr 17, 2024  Today's Date: 04/17/2024 PT Individual Time: 0800-0913 PT Individual Time Calculation (min): 73 min   Patient has met 4 of 4 short term goals.  Pt is making great progress towards LTG's however pt ambulation is limited by pain/weakness/fatigue. Pt is performing bed mobility with supervision, sit to stand with RW and min-mod A. Stand pivot transfer with min-mod A. Gait x8 feet with min A and +2 for WC follow. Downgraded ambulation distance to 10 feet 2/2 pain/fatigue/endurance deficits. D/C scheduled for 5/21.   Patient continues to demonstrate the following deficits muscle weakness and muscle joint tightness, decreased cardiorespiratoy endurance, and decreased sitting balance, decreased standing balance, decreased balance strategies, and difficulty maintaining precautions and therefore will continue to benefit from skilled PT intervention to increase functional independence with mobility.  Patient progressing toward long term goals..  See goal revision 5/14.   PT Short Term Goals Week 1:  PT Short Term Goal 1 (Week 1): pt will perform bed mobility with min A or less with LRAD PT Short Term Goal 1 - Progress (Week 1): Met PT Short Term Goal 2 (Week 1): pt will perform sit to stand with LRAD and min A PT Short Term Goal 2 - Progress (Week 1): Met PT Short Term Goal 3 (Week 1): pt will perform bed to chiar transfer with LRAD and mod A or less PT Short Term Goal 3 - Progress (Week 1): Met PT Short Term Goal 4 (Week 1): pt will initiate gait training with LRAD PT Short Term Goal 4 - Progress (Week 1): Met Week 2:  PT Short Term Goal 1 (Week 2): STG=LTG 2/2 ELOS  Skilled Therapeutic Interventions/Progress Updates:      Pt supine in bed upon arrival. Pt agreeable to therapy. Pt  reports 4/10 pain R LE. Notified nursing. Nurse present to administer pain medication. Therapist provided rest breaks and repositioning for SOB/fatigue. Pt reports pain reduced with mobility.   Pt requesting to use bathroom. Pt performed supine to sit with supervision and increased time. Pt performed stand pivot transfer bed to [facility] with RW and light min A from elevated hospital bed. Pt continent of bowel and bladder, pt performed pericare with supervision while seated, verba ues provided for lateral trunk tehnique.   Pt donned shirt and bra while seated with assist to fasten bra and button shirt.   Pt donned pants while seated with supervision, and over buttocks while standing with RW and CGA/min A,  verbal cues provided for technique/sequencing.   Pt donned ted hose and shoes with total A while seated.   Verbal cues provided for pursed lip breathing for management of SOB/fatigue.   Pt performed stand pivot transfer with min-mod A throughout session, level of assist fluctuates with fatigue, verbal cues provided for controlled descent for stand to sit.   Pt stood and performed multidirectional reaching with unilateral and bilateral UE while standing for ~3 min with RW and CGA to stack/unstack cones and place cones in various corners of the table.   Pt seated in recliner at end of session with all needs within reach and chair alarm on.       Therapy Documentation Precautions:  Precautions Precautions: Fall Recall of Precautions/Restrictions: Impaired Restrictions Weight Bearing Restrictions Per Provider Order: Yes RLE Weight Bearing Per Provider Order:  Touchdown weight bearing  Therapy/Group: Individual Therapy  [facility] Jenney Modest, [place], DPT  04/17/2024, 7:56 AM

## 2024-04-18 ENCOUNTER — Ambulatory Visit (HOSPITAL_COMMUNITY): Admitting: Physician Assistant

## 2024-04-18 LAB — CBC
HCT: 26.1 % — ABNORMAL LOW (ref 36.0–46.0)
Hemoglobin: 8.3 g/dL — ABNORMAL LOW (ref 12.0–15.0)
MCH: 29.1 pg (ref 26.0–34.0)
MCHC: 31.8 g/dL (ref 30.0–36.0)
MCV: 91.6 fL (ref 80.0–100.0)
Platelets: 374 10*3/uL (ref 150–400)
RBC: 2.85 MIL/uL — ABNORMAL LOW (ref 3.87–5.11)
RDW: 14.9 % (ref 11.5–15.5)
WBC: 7.5 10*3/uL (ref 4.0–10.5)
nRBC: 0 % (ref 0.0–0.2)

## 2024-04-18 LAB — BASIC METABOLIC PANEL WITH GFR
Anion gap: 8 (ref 5–15)
BUN: 11 mg/dL (ref 8–23)
CO2: 23 mmol/L (ref 22–32)
Calcium: 9 mg/dL (ref 8.9–10.3)
Chloride: 108 mmol/L (ref 98–111)
Creatinine, Ser: 0.73 mg/dL (ref 0.44–1.00)
GFR, Estimated: >60 mL/min (ref >60)
Glucose, Bld: 107 mg/dL — ABNORMAL HIGH (ref 70–99)
Potassium: 3.6 mmol/L (ref 3.5–5.1)
Sodium: 139 mmol/L (ref 135–145)

## 2024-04-18 NOTE — NC FL2 (Signed)
 [place]  MEDICAID FL2 LEVEL OF CARE FORM     IDENTIFICATION  Patient Name: [name] Birthdate: [birth year] Sex: female Admission Date (Current Location): 04/09/2024  [facility] [place] Number:  Producer, television/film/video and Address:  [facility], [address] [place], [place] [address]      Provider Number: [id number]  Attending Physician Name and Address:  Lylia Sand, MD  Relative Name and Phone Number:  Sherida Dimmer [phone]    Current Level of Care: Other (Comment) (rehab) Recommended Level of Care: Skilled Nursing Facility Prior Approval Number:    Date Approved/Denied:   PASRR Number: [id number]  Discharge Plan: SNF    Current Diagnoses: Patient Active Problem List   Diagnosis Date Noted   Femur fracture (HCC) 04/09/2024   Normocytic anemia 04/03/2024   Hyperglycemia 04/03/2024   Closed displaced oblique fracture of shaft of right femur (HCC) 04/03/2024   Atrial fibrillation with RVR (HCC) 04/03/2024   New onset atrial fibrillation (HCC) 04/03/2024   Glaucoma 04/03/2024   Aortic atherosclerosis (HCC) 04/03/2024   AKI (acute kidney injury) (HCC) 08/15/2022   Hypotension due to hypovolemia 08/15/2022   Cervical radiculopathy 04/13/2021   DDD (degenerative disc disease), cervical 08/21/2018   Non-traumatic rhabdomyolysis 04/18/2017   Lower urinary tract infectious disease 04/18/2017   Hypokalemia    Sepsis (HCC) 04/17/2017   Chronic pain syndrome 12/19/2014   Fibromyalgia 12/19/2014   Constipation 12/19/2014   Other hemorrhoids 12/19/2014   Polyneuropathy, unspecified 12/19/2014   Insomnia, unspecified 08/12/2014   OA (osteoarthritis) of hip 08/06/2014   Chest pain 04/05/2012   Dyspnea 04/05/2012   Intraductal papilloma of breast - left breast 09/13/2011   Hyperlipidemia 02/14/2011   Depression 02/14/2011   Hereditary and idiopathic peripheral neuropathy 02/14/2011   Essential hypertension 02/14/2011   GERD 02/14/2011    Osteoarthritis 02/14/2011   URINARY INCONTINENCE, STRESS 02/14/2011   SKIN CANCER, HX OF 02/14/2011    Orientation RESPIRATION BLADDER Height & Weight     Self, Time, Situation, Place  Normal Continent Weight: 246 lb 4.1 oz (111.7 kg) Height:  5\' 10"  (177.8 cm)  BEHAVIORAL SYMPTOMS/MOOD NEUROLOGICAL BOWEL NUTRITION STATUS      Continent Diet (regular with thin liquids)  AMBULATORY STATUS COMMUNICATION OF NEEDS Skin   Extensive Assist Verbally Surgical wounds                       Personal Care Assistance Level of Assistance  Bathing, Dressing Bathing Assistance: Limited assistance   Dressing Assistance: Limited assistance     Functional Limitations Info  Sight, Hearing, Speech Sight Info: Adequate Hearing Info: Adequate Speech Info: Adequate    SPECIAL CARE FACTORS FREQUENCY  PT (By licensed PT), OT (By licensed OT)     PT Frequency: 5x week OT Frequency: 5x week            Contractures Contractures Info: Not present    Additional Factors Info  Code Status, Allergies Code Status Info: Full Code Allergies Info: Procaine           Current Medications (04/18/2024):  This is the current hospital active medication list Current Facility-Administered Medications  Medication Dose Route Frequency Provider Last Rate Last Admin   acetaminophen  (TYLENOL ) tablet 325-650 mg  325-650 mg Oral Q4H PRN Angiulli, Daniel J, PA-C   650 mg at 04/18/24 1660   apixaban  (ELIQUIS ) tablet 5 mg  5 mg Oral BID AngiulliEverlyn Hockey, PA-C   5 mg at 04/18/24  0945   bisacodyl  (DULCOLAX) EC tablet 10 mg  10 mg Oral QHS Sterling Eisenmenger, PA-C   10 mg at 04/17/24 2133   bisacodyl  (DULCOLAX) suppository 10 mg  10 mg Rectal Daily PRN Sterling Eisenmenger, PA-C       docusate sodium  (COLACE) capsule 100 mg  100 mg Oral Daily Angiulli, Daniel J, PA-C   100 mg at 04/18/24 1610   dorzolamide -timolol  (COSOPT ) 2-0.5 % ophthalmic solution 1 drop  1 drop Both Eyes BID Sterling Eisenmenger, PA-C   1  drop at 04/18/24 0946   feeding supplement (ENSURE ENLIVE / ENSURE PLUS) liquid 237 mL  237 mL Oral BID BM Angiulli, Daniel J, PA-C   237 mL at 04/18/24 9604   ferrous sulfate  tablet 325 mg  325 mg Oral Q breakfast Angiulli, Daniel J, PA-C   325 mg at 04/18/24 5409   folic acid  (FOLVITE ) tablet 1 mg  1 mg Oral Daily Angiulli, Daniel J, PA-C   1 mg at 04/18/24 0945   latanoprost  (XALATAN ) 0.005 % ophthalmic solution 1 drop  1 drop Both Eyes QHS AngiulliEverlyn Hockey, PA-C   1 drop at 04/17/24 2137   metoprolol  tartrate (LOPRESSOR ) tablet 25 mg  25 mg Oral BID Angiulli, Daniel J, PA-C   25 mg at 04/18/24 0945   multivitamin with minerals tablet 1 tablet  1 tablet Oral Daily Angiulli, Daniel J, PA-C   1 tablet at 04/18/24 0945   ondansetron  (ZOFRAN ) tablet 4 mg  4 mg Oral Q6H PRN Angiulli, Daniel J, PA-C       Or   ondansetron  (ZOFRAN ) injection 4 mg  4 mg Intravenous Q6H PRN Angiulli, Daniel J, PA-C       oxyCODONE  (Oxy IR/ROXICODONE ) immediate release tablet 5 mg  5 mg Oral Q6H PRN Sterling Eisenmenger, PA-C   5 mg at 04/14/24 0815   pantoprazole  (PROTONIX ) EC tablet 40 mg  40 mg Oral Daily Angiulli, Daniel J, PA-C   40 mg at 04/18/24 8119   polyethylene glycol (MIRALAX  / GLYCOLAX ) packet 17 g  17 g Oral Daily Lylia Sand, MD   17 g at 04/15/24 1478   polyethylene glycol (MIRALAX  / GLYCOLAX ) packet 17 g  17 g Oral Daily PRN Lovorn, Megan, MD       pregabalin  (LYRICA ) capsule 25 mg  25 mg Oral BID Angiulli, Daniel J, PA-C   25 mg at 04/18/24 0944   senna (SENOKOT) tablet 17.2 mg  2 tablet Oral Daily Lovorn, Megan, MD   17.2 mg at 04/18/24 0945   sertraline  (ZOLOFT ) tablet 50 mg  50 mg Oral Daily Angiulli, Daniel J, PA-C   50 mg at 04/18/24 2956   traZODone  (DESYREL ) tablet 150 mg  150 mg Oral QHS Angiulli, Daniel J, PA-C   150 mg at 04/17/24 2133     Discharge Medications: Please see discharge summary for a list of discharge medications.  Relevant Imaging Results:  Relevant Lab  Results:   Additional Information SSN: [id number]  Leary Mcnulty, Maralee Senate, LCSW

## 2024-04-18 NOTE — Progress Notes (Signed)
 Occupational Therapy Session Note  Patient Details  Name: [name] MRN: [id number] Date of Birth: [birth year]  {CHL IP REHAB OT TIME CALCULATIONS:[id number]}   Short Term Goals: Week 2:  OT Short Term Goal 1 (Week 2): STG = LTG (d/r ELOS)  Skilled Therapeutic Interventions/Progress Updates:    Patient agreeable to participate in OT session. Reports *** pain level.   Patient participated in skilled OT session focusing on ***. Therapist facilitated/assessed/developed/educated/integrated/elicited *** in order to improve/facilitate/promote    Therapy Documentation Precautions:  Precautions Precautions: Fall Recall of Precautions/Restrictions: Impaired Restrictions Weight Bearing Restrictions Per Provider Order: Yes RLE Weight Bearing Per Provider Order: Touchdown weight bearing  Therapy/Group: Individual Therapy  Carollee Circle, OTR/L,CBIS  Supplemental OT - [facility] and [facility] Secure Chat Preferred   04/18/2024, 9:02 PM

## 2024-04-18 NOTE — Progress Notes (Signed)
 Physical Therapy Session Note  Patient Details  Name: [name] MRN: [id number] Date of Birth: 1930/10/26  Today's Date: 04/18/2024 PT Individual Time: 0851-1000, 8295-6213 PT Individual Time Calculation (min): 69 min, 72 min   Short Term Goals: Week 2:  PT Short Term Goal 1 (Week 2): STG=LTG 2/2 ELOS  Skilled Therapeutic Interventions/Progress Updates:      Treatment Session 1  Pt seated in recliner upon arrival. Pt agreeable to therapy. Pt deneis any pain.   Donned shoes while seated in recliner and total A.   Pt performed stand pivot transfer recliner to WC with mod A for power up and CGA for pivot.   Pt performed stand pivot transfer WC to recliner with RW and min A for power up and CGA for pivot.   Pt transported dependent in [facility] outside for time/energy conservation.   Pt very appreciative of being outside.   Pt performed the following therex for B LE strengthening/ROM:   2x10 LAQ first set active assisted, 2nd set active-pt demos signficant knee extension lag   Passive knee extension for ~ 3 min  2x10 combined quad set/glute set and ankle pump with B LE propped up (with towel underneath the ankle)  Eduction provided not to place pillow under knees while seated in recliner -pt verbalized understanding and agreeable.   Pt self propelled WC with sup-intermittent min-mod A needed for navigating uneven sidewalk.   Pt propelled WC through gift shop with B UE and supervision, pt demos improved carry over of hand positioning at 12-3. Pt reports this technique is helpful for R shoulder pain management.   Pt seated in recliner with all needs within reach and chair alarm on.   Treatment Session 2   Pt seated in WC upon arrival. Pt agreeable to therapy. Pt reports 4/10 pain, requesting pain medicine, notified nursing.   Pt performed stand pivot transfer with min-mod A (with fatigue) with RW. Utilized steady towards end of session for toileting 2/2 fatigue.   Pt performed  the following therex while supine for B LE strengthening/ROM/independence with mobility  1x10 SLR acitve assisted B  2x5 glute bridge--pt demos carry over for R LE TDWBing   1x10 sidelying clamshells B   1x10 sidelying hip extension (active assisted) with knee bent   Pt continent of bladder. Pt performed pericare while seated. Pt donned pants with mod A.   Pt supine in bed with all needs within reach and bed alarm on.   Therapy Documentation Precautions:  Precautions Precautions: Fall Recall of Precautions/Restrictions: Impaired Restrictions Weight Bearing Restrictions Per Provider Order: Yes RLE Weight Bearing Per Provider Order: Touchdown weight bearing   Therapy/Group: Individual Therapy  [facility] Jenney Modest, [place], DPT  04/18/2024, 8:56 AM

## 2024-04-18 NOTE — Plan of Care (Signed)
  Problem: Consults Goal: RH GENERAL PATIENT EDUCATION Description: See Patient Education module for education specifics. Outcome: Progressing   Problem: RH BOWEL ELIMINATION Goal: RH STG MANAGE BOWEL WITH ASSISTANCE Description: STG Manage Bowel with mod I Assistance. Outcome: Progressing Goal: RH STG MANAGE BOWEL W/MEDICATION W/ASSISTANCE Description: STG Manage Bowel with Medication with mod I Assistance. Outcome: Progressing   Problem: RH BLADDER ELIMINATION Goal: RH STG MANAGE BLADDER WITH ASSISTANCE Description: STG Manage Bladder With toileting Assistance Outcome: Progressing   Problem: RH SKIN INTEGRITY Goal: RH STG SKIN FREE OF INFECTION/BREAKDOWN Description: Manage w min assist Outcome: Progressing   Problem: RH SAFETY Goal: RH STG ADHERE TO SAFETY PRECAUTIONS W/ASSISTANCE/DEVICE Description: STG Adhere to Safety Precautions With cues Assistance/Device. Outcome: Progressing   Problem: RH PAIN MANAGEMENT Goal: RH STG PAIN MANAGED AT OR BELOW PT'S PAIN GOAL Description: < 4 with prns Outcome: Progressing   Problem: RH KNOWLEDGE DEFICIT GENERAL Goal: RH STG INCREASE KNOWLEDGE OF SELF CARE AFTER HOSPITALIZATION Description: Patient and family will be able to manage care at discharge using educational resources for medications, skin care, etc independently Outcome: Progressing

## 2024-04-18 NOTE — Progress Notes (Signed)
 PROGRESS NOTE   Subjective/Complaints: No new complaints or concerns morning.  Reports she is feeling well overall.  LBM today 5/15  ROS:    Pt denies fever, SOB, abd pain, CP, N/V/ (+)C/D, and vision changes  Chronic constipation -improved  + Chronic stress incontinence-at baseline  Objective:   DG FEMUR, MIN 2 VIEWS RIGHT Result Date: 04/17/2024 CLINICAL DATA:  Fracture, postop. EXAM: RIGHT FEMUR 2 VIEWS COMPARISON:  Radiograph 04/04/2024 FINDINGS: Lateral plate and screw fixation of distal femur fracture. Unchanged fracture alignment. No periprosthetic lucency. No significant callus formation or interval healing. Previous hip and knee alignment. Persistent soft tissue edema. IMPRESSION: ORIF of distal femur fracture without hardware complication. Unchanged fracture alignment. No interval callus formation. Electronically Signed   By: Chadwick Colonel M.D.   On: 04/17/2024 11:40    Recent Labs    04/18/24 0553  WBC 7.5  HGB 8.3*  HCT 26.1*  PLT 374    Recent Labs    04/18/24 0553  NA 139  K 3.6  CL 108  CO2 23  GLUCOSE 107*  BUN 11  CREATININE 0.73  CALCIUM  9.0     Intake/Output Summary (Last 24 hours) at 04/18/2024 1607 Last data filed at 04/18/2024 0800 Gross per 24 hour  Intake 720 ml  Output 800 ml  Net -80 ml        Physical Exam: Vital Signs Blood pressure 124/65, pulse 70, temperature 98.3 F (36.8 C), temperature source Oral, resp. rate 17, height 5\' 10"  (1.778 m), weight 111.7 kg, SpO2 97%.      General: awake, alert, appropriate, lying in bed, working with therapy HENT: Castor, AT, conjugate gaze; oropharynx moist CV: RRR Pulmonary: Clear to auscultation bilaterally, nonlabored breathing, on RA GI: soft, NT, ND, (+)BS Psychiatric: appropriate, pleasant Neurological: Ox3   Extremities: Trace bilateral lower extremity swelling.   Right hip incision with dressing CDI, appropriately  tender  Skin: Clean and intact without signs of breakdown Neuro:    Mental Status: AAOx3, follows commands Speech/Languate: fluent CRANIAL NERVES: Grossly intact  Follows full commands. Decreased to light touch in hands B/L- not rest of Ue's- B/L as well as L1 to S2 B/L- worse in LE's- more sensory deficits  Ue's 5-/5 LLE 5-/5 throughout RLE_ HF 4-/5; KE 5-/5 and DF/PF 5-/5- limited by pain when testing MSK strength   MSK: R hip moderate swelling-decreasing gradually    Assessment/Plan: 1. Functional deficits which require 3+ hours per day of interdisciplinary therapy in a comprehensive inpatient rehab setting. Physiatrist is providing close team supervision and 24 hour management of active medical problems listed below. Physiatrist and rehab team continue to assess barriers to discharge/monitor patient progress toward functional and medical goals  Care Tool:  Bathing    Body parts bathed by patient: Right arm, Left arm, Chest, Abdomen, Right upper leg, Left upper leg, Face, Front perineal area, Right lower leg, Left lower leg   Body parts bathed by helper: Buttocks     Bathing assist Assist Level: Minimal Assistance - Patient > 75%     Upper Body Dressing/Undressing Upper body dressing   What is the patient wearing?: Bra, Pull over shirt  Upper body assist Assist Level: Minimal Assistance - Patient > 75%    Lower Body Dressing/Undressing Lower body dressing      What is the patient wearing?: Incontinence brief, Pants     Lower body assist Assist for lower body dressing: Moderate Assistance - Patient 50 - 74%     Toileting Toileting    Toileting assist Assist for toileting: Total Assistance - Patient < 25%     Transfers Chair/bed transfer  Transfers assist     Chair/bed transfer assist level: Minimal Assistance - Patient > 75%     Locomotion Ambulation   Ambulation assist   Ambulation activity did not occur: Safety/medical concerns  (pain/fatigue)  Assist level: 2 helpers Assistive device: Walker-rolling Max distance: 8   Walk 10 feet activity   Assist  Walk 10 feet activity did not occur: Safety/medical concerns (pain/fatigue)        Walk 50 feet activity   Assist Walk 50 feet with 2 turns activity did not occur: Safety/medical concerns         Walk 150 feet activity   Assist Walk 150 feet activity did not occur: Safety/medical concerns         Walk 10 feet on uneven surface  activity   Assist Walk 10 feet on uneven surfaces activity did not occur: Safety/medical concerns         Wheelchair     Assist Is the patient using a wheelchair?: Yes Type of Wheelchair: Manual    Wheelchair assist level: Independent Max wheelchair distance: 150    Wheelchair 50 feet with 2 turns activity    Assist        Assist Level: Independent   Wheelchair 150 feet activity     Assist      Assist Level: Independent   Blood pressure 124/65, pulse 70, temperature 98.3 F (36.8 C), temperature source Oral, resp. rate 17, height 5\' 10"  (1.778 m), weight 111.7 kg, SpO2 97%.   Medical Problem List and Plan: 1. Functional deficits secondary to right supracondylar periprosthetic distal femur fracture.  Status post open reduction internal fixation 04/04/2024 per Dr. Guyann Leitz. TDWB right lower extremity for 4-6 weeks             -patient may  shower             -ELOS/Goals: 12-14 days Supervision to min A             Con't CIR  - Dr. Guyann Leitz op note indicates plan for suture removal about 2 weeks and repeat x-rays-discussed with [name] PA- will check tomorrow  -5/21 expected DC date  -Xray fracture appear stable  2.  Antithrombotics: -DVT/anticoagulation:  Pharmaceutical: Eliquis .  Venous Doppler study negative.             -antiplatelet therapy: N/A 3. Pain Management: Lyrica  25 mg BID,, oxycodone  as needed- more pain from peripheral neuropathy than her RLE- mainly L ankle per pt  -5/8  occasional use of oxycodone , continue current regimen  - 5/9 pain improving infrequent use of as needed oxycodone   5/13-15 pain controlled, not needing frequent as needed meds 4. Mood/Behavior/Sleep: Zoloft  50 mg daily, trazodone  150 mg nightly.  Provide emotional support  5/10- needed help to deal with grief of being in hospital right now             -antipsychotic agents: N/A 5. Neuropsych/cognition: This patient is capable of making decisions on her own behalf. 6. Skin/Wound Care: Routine skin checks 7.  Fluids/Electrolytes/Nutrition: Routine in and outs with follow-up chemistries 8.  Acute blood loss anemia as well as history of normocytic anemia.  Continue iron supplement.  Follow-up CBC  - 5/7 HGB stable 8.4  -5/12 hemoglobin a little lower at 7.9, check stool blood test-neg  -5/15 stable at 8.3 9.  Transient atrial fibrillation with RVR Lopressor  25 mg twice daily.  Follow-up cardiology services.  Continue Eliquis  until further notice.. 10.  Obesity.  BMI 30.85.  Dietary follow-up  5/10- BMI up to 34.51- will follow weight because last weight was significantly less  11.  Acute on Chronic Constipation.  MiraLAX  twice daily, Senokot daily, Colace 100 mg daily.  -5/7 improved after bowel movement today  -5/9 LBM today, improved.  Decrease MiraLAX  frequency to daily  5/10- LBM yesterday  5/11- Will add Senna 2 tabs daily and miralax  daily prn along with daily scheduled due to constipation today and cannot go   5/12 LBM today, improved continue to monitor  LBM 5/15, monitor bowel function 12.AKI.Resolved.Follow up chemestries  5/7 BUN/Cr stable, continue to monitor  5/12 creatinine BUN stable at 0.69/14 13. Hypokalemia  -5/12  K+ today  -5/15 Stable 3.6 14. Frequent urination  -5/12 U/A negative, symptoms have improved     LOS: 9 days A FACE TO FACE EVALUATION WAS PERFORMED  [name] 04/18/2024, 4:07 PM

## 2024-04-18 NOTE — Progress Notes (Signed)
 Occupational Therapy Session Note  Patient Details  Name: [name] MRN: [id number] Date of Birth: May 14, 1930  Today's Date: 04/18/2024 OT Individual Time: 1350-1415 OT Individual Time Calculation (min): 25 min    Short Term Goals: Week 2:  OT Short Term Goal 1 (Week 2): STG = LTG (d/r ELOS)  Skilled Therapeutic Interventions/Progress Updates:  Skilled OT intervention completed with focus on functional transfers, w/c management, w/c mobility. Pt received seated in recliner, agreeable to session. No pain reported.  Pt requested to brush teeth. Required cueing for sequencing and safety, during light min A sit > stand with RW and CGA stand pivot with RW > w/c. Pt verbalized her RLE TDWB precautions, however difficulty maintaining on sit > stand, but improved with transfer with pt using heel > toe slide method.  Pt was set up A for oral care at sink. Discussed pt's plans of using w/c for long distances. Discussed importance of self-donning of w/c leg rests in prep for functional w/c mobility. Pt required max cueing and repeated trials for accuracy, however able to donn/doff both leg rests with supervision 2 times each side.  Pt self-propelled in w/c about 100 ft total using BUE, including 2 turns with supervision. Pt mildly SOB with exertion but improved with cessation of activity. Back in room, pt remained seated in w/c, with chair alarm on/activated, and with all needs in reach at end of session.   Therapy Documentation Precautions:  Precautions Precautions: Fall Recall of Precautions/Restrictions: Impaired Restrictions Weight Bearing Restrictions Per Provider Order: Yes RLE Weight Bearing Per Provider Order: Touchdown weight bearing    Therapy/Group: Individual Therapy  Ruthanna Covert, MS, OTR/L  04/18/2024, 2:35 PM

## 2024-04-18 NOTE — Progress Notes (Signed)
 Physical Therapy Session Note  Patient Details  Name: [name] MRN: [id number] Date of Birth: [birth year]  Today's Date: 04/17/2024 PT Individual Time: 1541- 1620 PT Individual Time Calculation (min): 39 min  Short Term Goals: Week 1:  PT Short Term Goal 1 (Week 1): pt will perform bed mobility with min A or less with LRAD PT Short Term Goal 1 - Progress (Week 1): Met PT Short Term Goal 2 (Week 1): pt will perform sit to stand with LRAD and min A PT Short Term Goal 2 - Progress (Week 1): Met PT Short Term Goal 3 (Week 1): pt will perform bed to chiar transfer with LRAD and mod A or less PT Short Term Goal 3 - Progress (Week 1): Met PT Short Term Goal 4 (Week 1): pt will initiate gait training with LRAD PT Short Term Goal 4 - Progress (Week 1): Met Week 2:  PT Short Term Goal 1 (Week 2): STG=LTG 2/2 ELOS  Skilled Therapeutic Interventions/Progress Updates:  Patient seated upright in w/c in day room with hand off from OT. Patient alert and agreeable to PT session.   Patient with no pain complaint at start of session.  Therapeutic Activity: Discussed with pt her continued impairments and thoughts on desired focus of therapy prior to d/c home. Pt and lead PT relate need to work on getting to pt's bathroom d/t inability to enter bathroom with w/c. Discussed leaving RW on inside of door frame and stand pivot to toilet which is inside of door frame to L side. Pt is able to return demo to armchair placed in setup of home environment. Continues to require MinA for power up from seat. May require increased height to [facility] over toilet at home.   Wheelchair Mobility:  Pt educated in use of BUE and BLE to propel w/c. Educated in push of w/c with restricted ROM to 1 o'clock to 3 o'clock to R wheel in propel. This reduces torque on pt's shoulders which she relates decreases soreness. Pt propelled wheelchair >200 feet with use of BUE and LLE. Pt's RLE on elevating leg rest. Provided vc/ tc for  alternating use of UE/ LE to decrease fatigue.  Bed Mobility: On return to room, pt requests to return to bed. Is able to perform stand pivot to bed using RW and requiring MinA for power up only and supervision to complete to bed. CGA to assist with controlled descent. Pt performed sit >supine with supervision.  Patient supine in bed at end of session with brakes locked, bed alarm set, and all needs within reach.   Therapy Documentation Precautions:  Precautions Precautions: Fall Recall of Precautions/Restrictions: Impaired Restrictions Weight Bearing Restrictions Per Provider Order: Yes RLE Weight Bearing Per Provider Order: Touchdown weight bearing  Pain: No pain related throughout session.    Therapy/Group: Individual Therapy  Donne Gage PT, DPT, CSRS 04/17/2024, 6:55 PM

## 2024-04-19 DIAGNOSIS — M545 Low back pain, unspecified: Secondary | ICD-10-CM

## 2024-04-19 DIAGNOSIS — G8929 Other chronic pain: Secondary | ICD-10-CM

## 2024-04-19 NOTE — Progress Notes (Signed)
 Physical Therapy Session Note  Patient Details  Name: [name] MRN: [id number] Date of Birth: 08/03/1930  Today's Date: 04/19/2024 PT Individual Time: 0802-0859 PT Individual Time Calculation (min): 57 min   Short Term Goals: Week 1:  PT Short Term Goal 1 (Week 1): pt will perform bed mobility with min A or less with LRAD PT Short Term Goal 1 - Progress (Week 1): Met PT Short Term Goal 2 (Week 1): pt will perform sit to stand with LRAD and min A PT Short Term Goal 2 - Progress (Week 1): Met PT Short Term Goal 3 (Week 1): pt will perform bed to chiar transfer with LRAD and mod A or less PT Short Term Goal 3 - Progress (Week 1): Met PT Short Term Goal 4 (Week 1): pt will initiate gait training with LRAD PT Short Term Goal 4 - Progress (Week 1): Met Week 2:  PT Short Term Goal 1 (Week 2): STG=LTG 2/2 ELOS  Skilled Therapeutic Interventions/Progress Updates:  Patient supine in bed on entrance to room. Patient alert and agreeable to PT session.   Patient with no pain complaint at start of session. Does relate need to toilet.   Therapeutic Activity: Bed Mobility: Pt performed supine > sit with overall supervision requiring extra time and effort via vc to complete. Seated balance good. After toileting, pt is able to doff sleep top and don bra with Min/ ModA to fasten closure and setup to don shirt. Maintains seated balance throughout. Difficulty with reaching outside of R side BOS d/t weakness and pressure to RLE.  Transfers: Pt performed sit<>stand in STEDY from bed surface requiring some elevation and ModA. MinA to descend to seat at toilet and ModA to rise from toilet. MaxA for pericare shile pt standing at [facility] with supervision. Sit<>stand to RW and // bars using push-to-stand technique transfers throughout session with Min to ModA. Provided vc/ tc for bringing weight forward throughout rise to stand.  Wheelchair Mobility:  Pt propelled wheelchair in room but has difficulty with  managing smaller spaces d/t reduced ability to turn and look at obstacles in environment. Requires up to ModA. Is able to propel 100 ft using BUE and LLE. No cueing required.   Therapeutic Exercise: Pt performed the following standing exercises for RLE at the outside of // bars with vc/ tc for proper technique. Marches 2x10 Lateral SL abd 2x10 Hip extension 2x10 HS curls  Request to sit d/t fatigue.  Gait Training:  Pt provided with verbal instruction with visual demonstration re: biomechanics behind TDWB restrictions. Educated that with thigh position infront of pt and BUE pressure into AD, and toe touching floor only, pt will have minimal weight on RLE if L foot is hopped to position behind RLE. Pt demos understanding and is encouraged to attempt. In // bars, she is able to ambulate 6 ft with CGA. Provided vc/ tc for technique throughout. Pt is very encouraged that she will walk again.   Patient seated upright in w/c at end of session with brakes locked, belt alarm set, and all needs within reach. Oriented to start time of OT session in 15 min.    Therapy Documentation Precautions:  Precautions Precautions: Fall Recall of Precautions/Restrictions: Impaired Restrictions Weight Bearing Restrictions Per Provider Order: Yes RLE Weight Bearing Per Provider Order: Touchdown weight bearing  Pain:  No pain related this session.   Therapy/Group: Individual Therapy  Donne Gage PT, DPT, CSRS 04/19/2024, 7:53 AM

## 2024-04-19 NOTE — Progress Notes (Signed)
 Orthopaedic Trauma Service Progress Note  Patient ID: [name] MRN: [id number] DOB/AGE: 1930/07/20 89 y.o.  Subjective:  Denies any pain in R leg  Doing very well No new issues Planning on dc from CIR next week  Xrays of R femur from 2 days ago look great    ROS As above  Today's  total administered Morphine  Milligram Equivalents: 0 Yesterday's total administered Morphine  Milligram Equivalents: 0  Objective:   VITALS:   Vitals:   04/18/24 1248 04/18/24 1855 04/19/24 0438 04/19/24 0500  BP: 124/65 139/79 (!) 151/65   Pulse: 70 88 75   Resp: 17 16 18    Temp: 98.3 F (36.8 C) 98.3 F (36.8 C) 98.2 F (36.8 C)   TempSrc: Oral Oral    SpO2: 97% 92% 95%   Weight:    117.9 kg  Height:        Estimated body mass index is 37.29 kg/m as calculated from the following:   Height as of this encounter: 5\' 10"  (1.778 m).   Weight as of this encounter: 117.9 kg.   Intake/Output      05/15 0701 05/16 0700 05/16 0701 05/17 0700   P.O. 1320    Total Intake(mL/kg) 1320 (11.2)    Urine (mL/kg/hr) 300 (0.1)    Emesis/NG output     Stool     Total Output 300    Net +1020         Urine Occurrence 2 x    Stool Occurrence 1 x      LABS  No results found for this or any previous visit (from the past 24 hours).   PHYSICAL EXAM:    Gen: sitting up in chair, pleasant, NAD  Ext:  Right Lower Extremity Dressing is clean, dry and intact  Incisions look excellent  No drainage              Extremity is warm             No DCT             Compartments are soft             No pain out of proportion with passive stretching of his toes or ankle             DPN, SPN, TN sensory functions at baseline              EHL, FHL, lesser toe motor functions intact             Ankle flexion, extension, inversion eversion intact             + DP pulse  Assessment/Plan:     Principal Problem:   Femur  fracture (HCC)   Anti-infectives (From admission, onward)    None     .  POD/HD#: 24  - 89 y/o female s/p fall with R periprosthetic dist femur fracture below THA    - fall   -R periprosthetic distal femur fracture below THA  Weightbearing NWB R leg x 4 more weeks               ROM/Activity                         No knee motion restriction  Standard joint precautions for post THA               Wound care                         Ok to leave incisions open to the air    Ok to shower and clean with soap and water only    Dc sutures day or 2 prior to discharge from CIR      - Pain management:             Multimodal    Only using tylenol    - Medical issues              Per primary    - DVT/PE prophylaxis:              At dc continue asa 81 mg daily    - ID:              Periop abx completed    - Metabolic Bone Disease:             Vitamin d  levels look good    - Impediments to fracture healing:             Fragility fracture               - Dispo:             Follow up with ortho trauma 3 weeks after dc from CIR   Jessica Kotyk, PA-C [phone] (C) 04/19/2024, 10:51 AM  [facility] [address] [place] [address] [phone] [name] (F)    After 5pm and on the weekends please log on to Amion, go to orthopaedics and the look under the [facility] Call for the provider(s) on call. You can also call our office at [phone] and then follow the prompts to be connected to the call team.  Patient ID: [name], female   DOB: 1930/09/17, 89 y.o.   MRN: [id number]

## 2024-04-19 NOTE — Progress Notes (Signed)
 PROGRESS NOTE   Subjective/Complaints: Back pain this morning, she has chronic back pain. Has not received medications yet, nursing getting them.   LBM today 5/15  ROS:    Pt denies  HA, SOB, abd pain, CP, N/V/ (+)C/D, and vision changes  Chronic constipation -improved  + Chronic stress incontinence-at baseline  Objective:   No results found.   Recent Labs    04/18/24 0553  WBC 7.5  HGB 8.3*  HCT 26.1*  PLT 374    Recent Labs    04/18/24 0553  NA 139  K 3.6  CL 108  CO2 23  GLUCOSE 107*  BUN 11  CREATININE 0.73  CALCIUM  9.0     Intake/Output Summary (Last 24 hours) at 04/19/2024 1054 Last data filed at 04/19/2024 0437 Gross per 24 hour  Intake 840 ml  Output 300 ml  Net 540 ml        Physical Exam: Vital Signs Blood pressure (!) 151/65, pulse 75, temperature 98.2 F (36.8 C), resp. rate 18, height 5\' 10"  (1.778 m), weight 117.9 kg, SpO2 95%.      General: awake, alert, appropriate, working with therapy in the gym HENT: [place], AT, conjugate gaze; oropharynx moist CV: RRR Pulmonary: Clear to auscultation bilaterally, nonlabored breathing, on RA GI: soft, NT, ND, (+)BS Psychiatric: appropriate, pleasant Neurological: Ox3   Extremities: Trace bilateral lower extremity swelling.   Right hip incision with dressing CDI, appropriately tender  Skin: Clean and intact without signs of breakdown Neuro:    Mental Status: AAOx3, follows commands Speech/Languate: fluent CRANIAL NERVES: Grossly intact  Follows full commands. Decreased to light touch in hands B/L- not rest of Ue's- B/L as well as L1 to S2 B/L- worse in LE's- more sensory deficits  Ue's 5-/5 LLE 5-/5 throughout RLE_ HF 4-/5; KE 5-/5 and DF/PF 5-/5- limited by pain when testing MSK strength   MSK: R hip moderate swelling-decreasing gradually    Assessment/Plan: 1. Functional deficits which require 3+ hours per day of  interdisciplinary therapy in a comprehensive inpatient rehab setting. Physiatrist is providing close team supervision and 24 hour management of active medical problems listed below. Physiatrist and rehab team continue to assess barriers to discharge/monitor patient progress toward functional and medical goals  Care Tool:  Bathing    Body parts bathed by patient: Right arm, Left arm, Chest, Abdomen, Right upper leg, Left upper leg, Face, Front perineal area, Right lower leg, Left lower leg   Body parts bathed by helper: Buttocks     Bathing assist Assist Level: Minimal Assistance - Patient > 75%     Upper Body Dressing/Undressing Upper body dressing   What is the patient wearing?: Bra, Pull over shirt    Upper body assist Assist Level: Minimal Assistance - Patient > 75%    Lower Body Dressing/Undressing Lower body dressing      What is the patient wearing?: Incontinence brief, Pants     Lower body assist Assist for lower body dressing: Moderate Assistance - Patient 50 - 74%     Toileting Toileting    Toileting assist Assist for toileting: Total Assistance - Patient < 25%     Transfers Chair/bed transfer  Transfers assist     Chair/bed transfer assist level: Minimal Assistance - Patient > 75%     Locomotion Ambulation   Ambulation assist   Ambulation activity did not occur: Safety/medical concerns (pain/fatigue)  Assist level: 2 helpers Assistive device: Walker-rolling Max distance: 8   Walk 10 feet activity   Assist  Walk 10 feet activity did not occur: Safety/medical concerns (pain/fatigue)        Walk 50 feet activity   Assist Walk 50 feet with 2 turns activity did not occur: Safety/medical concerns         Walk 150 feet activity   Assist Walk 150 feet activity did not occur: Safety/medical concerns         Walk 10 feet on uneven surface  activity   Assist Walk 10 feet on uneven surfaces activity did not occur: Safety/medical  concerns         Wheelchair     Assist Is the patient using a wheelchair?: Yes Type of Wheelchair: Manual    Wheelchair assist level: Independent Max wheelchair distance: 150    Wheelchair 50 feet with 2 turns activity    Assist        Assist Level: Independent   Wheelchair 150 feet activity     Assist      Assist Level: Independent   Blood pressure (!) 151/65, pulse 75, temperature 98.2 F (36.8 C), resp. rate 18, height 5\' 10"  (1.778 m), weight 117.9 kg, SpO2 95%.   Medical Problem List and Plan: 1. Functional deficits secondary to right supracondylar periprosthetic distal femur fracture.  Status post open reduction internal fixation 04/04/2024 per Dr. Guyann Leitz. TDWB right lower extremity for 4-6 weeks             -patient may  shower             -ELOS/Goals: 12-14 days Supervision to min A             Con't CIR  - Dr. Guyann Leitz op note indicates plan for suture removal about 2 weeks and repeat x-rays-discussed with [name] PA  -Discussed with [name], doing well, sutures can out before she goes, appreciate assistance  -5/21 expected DC date  -Xray fracture appear stable  2.  Antithrombotics: -DVT/anticoagulation:  Pharmaceutical: Eliquis .  Venous Doppler study negative.             -antiplatelet therapy: N/A 3. Pain Management: Lyrica  25 mg BID,, oxycodone  as needed- more pain from peripheral neuropathy than her RLE- mainly L ankle per pt  -5/8 occasional use of oxycodone , continue current regimen  - 5/9 pain improving infrequent use of as needed oxycodone   5/16 discussed asking for medication about an hour before 1st therapy. She reports chronic back pain 4. Mood/Behavior/Sleep: Zoloft  50 mg daily, trazodone  150 mg nightly.  Provide emotional support  5/10- needed help to deal with grief of being in hospital right now             -antipsychotic agents: N/A 5. Neuropsych/cognition: This patient is capable of making decisions on her own behalf. 6.  Skin/Wound Care: Routine skin checks 7. Fluids/Electrolytes/Nutrition: Routine in and outs with follow-up chemistries 8.  Acute blood loss anemia as well as history of normocytic anemia.  Continue iron supplement.  Follow-up CBC  - 5/7 HGB stable 8.4  -5/12 hemoglobin a little lower at 7.9, check stool blood test-neg  -5/15 stable at 8.3 9.  Transient atrial fibrillation with RVR Lopressor  25 mg twice daily.  Follow-up cardiology services.  Continue Eliquis  until further notice..  - HR controlled       04/19/2024    5:00 AM 04/19/2024    4:38 AM 04/18/2024    6:55 PM  Vitals with BMI  Weight 259 lbs 15 oz    BMI 37.29    Systolic  151 139  Diastolic  65 79  Pulse  75 88    10.  Obesity.  BMI 30.85.  Dietary follow-up  5/10- BMI up to 34.51- will follow weight because last weight was significantly less  11.  Acute on Chronic Constipation.  MiraLAX  twice daily, Senokot daily, Colace 100 mg daily.  -5/7 improved after bowel movement today  -5/9 LBM today, improved.  Decrease MiraLAX  frequency to daily  5/10- LBM yesterday  5/11- Will add Senna 2 tabs daily and miralax  daily prn along with daily scheduled due to constipation today and cannot go   5/12 LBM today, improved continue to monitor  LBM 5/15,  continue current regimen and monitor  12.AKI.Resolved.Follow up chemestries  5/7 BUN/Cr stable, continue to monitor  5/12 creatinine BUN stable at 0.69/14 13. Hypokalemia  -5/12  K+ today  -5/15 Stable 3.6 14. Frequent urination  -5/12 U/A negative, symptoms have improved     LOS: 10 days A FACE TO FACE EVALUATION WAS PERFORMED  [name] 04/19/2024, 10:54 AM

## 2024-04-19 NOTE — Group Note (Signed)
 Patient Details Name: MALOREY HIRSHMAN MRN: [id number] DOB: 1930/09/04 Today's Date: 04/19/2024  Time Calculation: OT Group Time Calculation OT Group Start Time: 1404 OT Group Stop Time: 1504 OT Group Time Calculation (min): 60 min      Group Description: Stress management: Pt participated in group session with a focus on stress mgmt, education provided on healthy coping strategies, and social interaction. Focus of session on providing coping strategies to manage new diagnosis to allow for improved mental health to increase overall quality of life . Discussed how to break down stressors into "daily hassles," "major life stressors" and "life circumstances" in an effort to allow pts to chunk their stressors into groups and determine where to best put their efforts/time when dealing with stress. Provided active listening, emotional support and therapeutic use of self. Offered education on factors that protect us  against stress such as "daily uplifts," "healthy coping strategies" and "protective factors." Encouraged all group members to make an effort to actively recall one event from their day that was a daily uplift in an effort to protect their mindset from stressors as well as sharing this information with their caregivers to facilitate improved caregiver communication and decrease overall burden of care.  Issued pt handouts on healthy coping strategies to implement into routine.   Individual level documentation: Patient participated with full collaboration during session.   Pain: No pain     Willadean Hark 04/19/2024, 7:39 PM

## 2024-04-20 NOTE — Progress Notes (Signed)
 PROGRESS NOTE   Subjective/Complaints: No new complaints or concerns this morning.  Family coming to visit today.  LBM today 5/15  ROS:    Pt denies fever,  HA, SOB, abd pain, CP, N/V/ (+)C/D, and vision changes  Chronic constipation -improved  + Chronic stress incontinence-unchanged  Objective:   No results found.   Recent Labs    04/18/24 0553  WBC 7.5  HGB 8.3*  HCT 26.1*  PLT 374    Recent Labs    04/18/24 0553  NA 139  K 3.6  CL 108  CO2 23  GLUCOSE 107*  BUN 11  CREATININE 0.73  CALCIUM  9.0     Intake/Output Summary (Last 24 hours) at 04/20/2024 1115 Last data filed at 04/20/2024 0443 Gross per 24 hour  Intake --  Output 500 ml  Net -500 ml        Physical Exam: Vital Signs Blood pressure (!) 156/76, pulse 77, temperature 97.9 F (36.6 C), resp. rate 17, height 5\' 10"  (1.778 m), weight 110 kg, SpO2 98%.      General: awake, alert, appropriate, laying in bed appears comfortable HENT: [place], AT, conjugate gaze; oropharynx moist CV: RRR Pulmonary: Clear to auscultation bilaterally, nonlabored breathing, on RA GI: soft, NT, ND, (+)BS Psychiatric: appropriate, pleasant Neurological: Ox3   Extremities: Trace bilateral lower extremity swelling.   Right hip incision with dressing CDI, appropriately tender  Skin: Clean and intact without signs of breakdown Neuro:    Mental Status: AAOx3, follows commands Speech/Languate: fluent CRANIAL NERVES: Grossly intact  Follows full commands. Decreased to light touch in hands B/L- not rest of Ue's- B/L as well as L1 to S2 B/L- worse in LE's- more sensory deficits  Ue's 5-/5 LLE 5-/5 throughout RLE_ HF 4-/5; KE 5-/5 and DF/PF 5-/5- limited by pain when testing MSK strength   MSK: R hip moderate swelling-decreasing gradually    Assessment/Plan: 1. Functional deficits which require 3+ hours per day of interdisciplinary therapy in a  comprehensive inpatient rehab setting. Physiatrist is providing close team supervision and 24 hour management of active medical problems listed below. Physiatrist and rehab team continue to assess barriers to discharge/monitor patient progress toward functional and medical goals  Care Tool:  Bathing    Body parts bathed by patient: Right arm, Left arm, Chest, Abdomen, Right upper leg, Left upper leg, Face, Front perineal area, Right lower leg, Left lower leg   Body parts bathed by helper: Buttocks     Bathing assist Assist Level: Minimal Assistance - Patient > 75%     Upper Body Dressing/Undressing Upper body dressing   What is the patient wearing?: Bra, Pull over shirt    Upper body assist Assist Level: Minimal Assistance - Patient > 75%    Lower Body Dressing/Undressing Lower body dressing      What is the patient wearing?: Incontinence brief, Pants     Lower body assist Assist for lower body dressing: Moderate Assistance - Patient 50 - 74%     Toileting Toileting    Toileting assist Assist for toileting: Total Assistance - Patient < 25%     Transfers Chair/bed transfer  Transfers assist  Chair/bed transfer assist level: Minimal Assistance - Patient > 75%     Locomotion Ambulation   Ambulation assist   Ambulation activity did not occur: Safety/medical concerns (pain/fatigue)  Assist level: 2 helpers Assistive device: Walker-rolling Max distance: 8   Walk 10 feet activity   Assist  Walk 10 feet activity did not occur: Safety/medical concerns (pain/fatigue)        Walk 50 feet activity   Assist Walk 50 feet with 2 turns activity did not occur: Safety/medical concerns         Walk 150 feet activity   Assist Walk 150 feet activity did not occur: Safety/medical concerns         Walk 10 feet on uneven surface  activity   Assist Walk 10 feet on uneven surfaces activity did not occur: Safety/medical concerns          Wheelchair     Assist Is the patient using a wheelchair?: Yes Type of Wheelchair: Manual    Wheelchair assist level: Independent Max wheelchair distance: 150    Wheelchair 50 feet with 2 turns activity    Assist        Assist Level: Independent   Wheelchair 150 feet activity     Assist      Assist Level: Independent   Blood pressure (!) 156/76, pulse 77, temperature 97.9 F (36.6 C), resp. rate 17, height 5\' 10"  (1.778 m), weight 110 kg, SpO2 98%.   Medical Problem List and Plan: 1. Functional deficits secondary to right supracondylar periprosthetic distal femur fracture.  Status post open reduction internal fixation 04/04/2024 per Dr. Guyann Leitz. TDWB right lower extremity for 4-6 weeks             -patient may  shower             -ELOS/Goals: 12-14 days Supervision to min A             Con't CIR  - Dr. Guyann Leitz op note indicates plan for suture removal about 2 weeks and repeat x-rays-discussed with [name] PA  -Discussed with [name], doing well, sutures can out before she goes, appreciate assistance  -5/21 expected DC date  - Seen by orthopedics, nonweightbearing right leg x 4 more weeks-DC sutures day or 2 before discharge, continue ASA daily at discharge?- she is on eliquis   2.  Antithrombotics: -DVT/anticoagulation:  Pharmaceutical: Eliquis .  Venous Doppler study negative.             -antiplatelet therapy: N/A 3. Pain Management: Lyrica  25 mg BID,, oxycodone  as needed- more pain from peripheral neuropathy than her RLE- mainly L ankle per pt  -5/8 occasional use of oxycodone , continue current regimen  - 5/9 pain improving infrequent use of as needed oxycodone   5/16 discussed asking for medication about an hour before 1st therapy. She reports chronic back pain  5/17 pain controlled, continue current  4. Mood/Behavior/Sleep: Zoloft  50 mg daily, trazodone  150 mg nightly.  Provide emotional support  5/10- needed help to deal with grief of being in hospital  right now             -antipsychotic agents: N/A 5. Neuropsych/cognition: This patient is capable of making decisions on her own behalf. 6. Skin/Wound Care: Routine skin checks 7. Fluids/Electrolytes/Nutrition: Routine in and outs with follow-up chemistries 8.  Acute blood loss anemia as well as history of normocytic anemia.  Continue iron supplement.  Follow-up CBC  - 5/7 HGB stable 8.4  -5/12 hemoglobin a little lower  at 7.9, check stool blood test-neg  -5/15 stable at 8.3  Recheck monday 9.  Transient atrial fibrillation with RVR Lopressor  25 mg twice daily.  Follow-up cardiology services.  Continue Eliquis  until further notice..  - 5/17 HR stable       04/20/2024    4:45 AM 04/19/2024    8:18 PM 04/19/2024   11:49 AM  Vitals with BMI  Weight 242 lbs 8 oz    BMI 34.8    Systolic 156 129 213  Diastolic 76 63 80  Pulse 77 71 72    10.  Obesity.  BMI 30.85.  Dietary follow-up  5/10- BMI up to 34.51- will follow weight because last weight was significantly less  11.  Acute on Chronic Constipation.  MiraLAX  twice daily, Senokot daily, Colace 100 mg daily.  -5/7 improved after bowel movement today  -5/9 LBM today, improved.  Decrease MiraLAX  frequency to daily  5/10- LBM yesterday  5/11- Will add Senna 2 tabs daily and miralax  daily prn along with daily scheduled due to constipation today and cannot go   5/12 LBM today, improved continue to monitor  LBM 5/15, consider additional medication tomorrow if no BM today 12.AKI.Resolved.Follow up chemestries  5/7 BUN/Cr stable, continue to monitor  5/12 creatinine BUN stable at 0.69/14  Recheck Monday 13. Hypokalemia  -5/12  K+ today  -5/15 Stable 3.6 14. Frequent urination  -5/12 U/A negative, symptoms have improved     LOS: 11 days A FACE TO FACE EVALUATION WAS PERFORMED  Lylia Sand 04/20/2024, 11:15 AM

## 2024-04-21 LAB — OCCULT BLOOD X 1 CARD TO LAB, STOOL: Fecal Occult Bld: NEGATIVE

## 2024-04-21 NOTE — Progress Notes (Signed)
 PROGRESS NOTE   Subjective/Complaints: No new concerns or complaints.  She is ready to get up, has not had therapy scheduled this weekend.  LBM today 5/18  ROS:    Pt denies vision changes, HA, SOB, abd pain, CP, N/V/ (+)C/D, and vision changes  Chronic constipation -improved  + Chronic stress incontinence-unchanged  Objective:   No results found.   No results for input(s): "WBC", "HGB", "HCT", "PLT" in the last 72 hours.   No results for input(s): "NA", "K", "CL", "CO2", "GLUCOSE", "BUN", "CREATININE", "CALCIUM " in the last 72 hours.    Intake/Output Summary (Last 24 hours) at 04/21/2024 1249 Last data filed at 04/21/2024 1209 Gross per 24 hour  Intake 676 ml  Output 925 ml  Net -249 ml        Physical Exam: Vital Signs Blood pressure 134/71, pulse 80, temperature 98.4 F (36.9 C), temperature source Oral, resp. rate 18, height 5\' 10"  (1.778 m), weight 111.5 kg, SpO2 98%.      General: awake, alert, appropriate, laying in bed appears comfortable HENT: [place], AT, conjugate gaze; oropharynx moist CV: RRR Pulmonary: Clear to auscultation bilaterally, nonlabored breathing, on RA GI: soft, NT, ND, (+)BS Psychiatric: Very pleasant Neurological: Ox3   Extremities: Trace bilateral lower extremity swelling.   Right hip incision with dressing CDI, appropriately tender  Skin: Clean and intact without signs of breakdown Neuro:    Mental Status: AAOx3, follows commands Speech/Languate: fluent CRANIAL NERVES: Grossly intact  Follows full commands. Decreased to light touch in hands B/L- not rest of Ue's- B/L as well as L1 to S2 B/L- worse in LE's- more sensory deficits  Ue's 5-/5 LLE 5-/5 throughout RLE_ HF 4/5; KE 5-/5 and DF/PF 5-/5- limited by pain when testing MSK strength   MSK: R hip moderate swelling-decreasing gradually    Assessment/Plan: 1. Functional deficits which require 3+ hours per day  of interdisciplinary therapy in a comprehensive inpatient rehab setting. Physiatrist is providing close team supervision and 24 hour management of active medical problems listed below. Physiatrist and rehab team continue to assess barriers to discharge/monitor patient progress toward functional and medical goals  Care Tool:  Bathing    Body parts bathed by patient: Right arm, Left arm, Chest, Abdomen, Right upper leg, Left upper leg, Face, Front perineal area, Right lower leg, Left lower leg   Body parts bathed by helper: Buttocks     Bathing assist Assist Level: Minimal Assistance - Patient > 75%     Upper Body Dressing/Undressing Upper body dressing   What is the patient wearing?: Bra, Pull over shirt    Upper body assist Assist Level: Minimal Assistance - Patient > 75%    Lower Body Dressing/Undressing Lower body dressing      What is the patient wearing?: Incontinence brief, Pants     Lower body assist Assist for lower body dressing: Moderate Assistance - Patient 50 - 74%     Toileting Toileting    Toileting assist Assist for toileting: Total Assistance - Patient < 25%     Transfers Chair/bed transfer  Transfers assist     Chair/bed transfer assist level: Minimal Assistance - Patient > 75%  Locomotion Ambulation   Ambulation assist   Ambulation activity did not occur: Safety/medical concerns (pain/fatigue)  Assist level: 2 helpers Assistive device: Walker-rolling Max distance: 8   Walk 10 feet activity   Assist  Walk 10 feet activity did not occur: Safety/medical concerns (pain/fatigue)        Walk 50 feet activity   Assist Walk 50 feet with 2 turns activity did not occur: Safety/medical concerns         Walk 150 feet activity   Assist Walk 150 feet activity did not occur: Safety/medical concerns         Walk 10 feet on uneven surface  activity   Assist Walk 10 feet on uneven surfaces activity did not occur:  Safety/medical concerns         Wheelchair     Assist Is the patient using a wheelchair?: Yes Type of Wheelchair: Manual    Wheelchair assist level: Independent Max wheelchair distance: 150    Wheelchair 50 feet with 2 turns activity    Assist        Assist Level: Independent   Wheelchair 150 feet activity     Assist      Assist Level: Independent   Blood pressure 134/71, pulse 80, temperature 98.4 F (36.9 C), temperature source Oral, resp. rate 18, height 5\' 10"  (1.778 m), weight 111.5 kg, SpO2 98%.   Medical Problem List and Plan: 1. Functional deficits secondary to right supracondylar periprosthetic distal femur fracture.  Status post open reduction internal fixation 04/04/2024 per Dr. Guyann Leitz. TDWB right lower extremity for 4-6 weeks             -patient may  shower             -ELOS/Goals: 12-14 days Supervision to min A             Con't CIR  - Dr. Guyann Leitz op note indicates plan for suture removal about 2 weeks and repeat x-rays-discussed with [name] PA  -Discussed with [name], doing well, sutures can out before she goes, appreciate assistance  -5/21 expected DC date  - Seen by orthopedics, nonweightbearing right leg x 4 more weeks-DC sutures day or 2 before discharge, continue ASA daily at discharge?- she is on eliquis   2.  Antithrombotics: -DVT/anticoagulation:  Pharmaceutical: Eliquis .  Venous Doppler study negative.             -antiplatelet therapy: N/A 3. Pain Management: Lyrica  25 mg BID,, oxycodone  as needed- more pain from peripheral neuropathy than her RLE- mainly L ankle per pt  -5/8 occasional use of oxycodone , continue current regimen  - 5/9 pain improving infrequent use of as needed oxycodone   5/16 discussed asking for medication about an hour before 1st therapy. She reports chronic back pain  5/17-8 pain controlled, continue current  4. Mood/Behavior/Sleep: Zoloft  50 mg daily, trazodone  150 mg nightly.  Provide emotional  support  5/10- needed help to deal with grief of being in hospital right now             -antipsychotic agents: N/A 5. Neuropsych/cognition: This patient is capable of making decisions on her own behalf. 6. Skin/Wound Care: Routine skin checks 7. Fluids/Electrolytes/Nutrition: Routine in and outs with follow-up chemistries 8.  Acute blood loss anemia as well as history of normocytic anemia.  Continue iron supplement.  Follow-up CBC  - 5/7 HGB stable 8.4  -5/12 hemoglobin a little lower at 7.9, check stool blood test-neg  -5/18 recheck tomorrow 9.  Transient atrial fibrillation with RVR Lopressor  25 mg twice daily.  Follow-up cardiology services.  Continue Eliquis  until further notice..  - 5/18 HR stable       04/21/2024    4:39 AM 04/21/2024    4:31 AM 04/20/2024    9:07 PM  Vitals with BMI  Weight 245 lbs 13 oz    BMI 35.27    Systolic  134 139  Diastolic  71 77  Pulse  80 74    10.  Obesity.  BMI 30.85.  Dietary follow-up  5/10- BMI up to 34.51- will follow weight because last weight was significantly less  11.  Acute on Chronic Constipation.  MiraLAX  twice daily, Senokot daily, Colace 100 mg daily.  -5/7 improved after bowel movement today  -5/9 LBM today, improved.  Decrease MiraLAX  frequency to daily  5/10- LBM yesterday  5/11- Will add Senna 2 tabs daily and miralax  daily prn along with daily scheduled due to constipation today and cannot go   5/12 LBM today, improved continue to monitor  LBM 5/18, continue to monitor  12.AKI.Resolved.Follow up chemestries  5/7 BUN/Cr stable, continue to monitor  5/12 creatinine BUN stable at 0.69/14  Recheck tomrrow 13. Hypokalemia  -5/12  K+ today  -5/15 Stable 3.6 14. Frequent urination  -5/12 U/A negative, symptoms have improved     LOS: 12 days A FACE TO FACE EVALUATION WAS PERFORMED  Lylia Sand 04/21/2024, 12:49 PM

## 2024-04-21 NOTE — Progress Notes (Signed)
 Occupational Therapy Weekly Progress Note  Patient Details  Name: [name] MRN: [id number] Date of Birth: [birth year]  Beginning of progress report period: Apr 16, 2024 End of progress report period: Apr 22, 2024   Session 1: Today's Date: 04/22/2024 OT Individual Time: 0805-0900 OT Individual Time Calculation (min): 55 min  Session 2: Today's Date: 04/22/2024 OT Individual Time: 2725-3664 OT Individual Time Calculation (min): 73 min   Patient's short term goals for week 2 have been her long term goals which she is progressing to. No long term goals have been met at this time.  Pt has been using AE for LB ADL tasks to increase functional performance.   Patient continues to demonstrate the following deficits: muscle weakness and acute pain, decreased cardiorespiratoy endurance, and decreased sitting balance, decreased standing balance, decreased postural control, decreased balance strategies, and difficulty maintaining precautions and therefore will continue to benefit from skilled OT intervention to enhance overall performance with BADL.  Patient progressing toward long term goals..  Continue plan of care.  OT Short Term Goals Week 2:  OT Short Term Goal 1 (Week 2): STG = LTG (d/r ELOS) OT Short Term Goal 1 - Progress (Week 2): Progressing toward goal Week 3:  OT Short Term Goal 1 (Week 3): STG = LTG (d/t ELOS)  Skilled Therapeutic Interventions/Progress Updates:    Session 1: Patient agreeable to participate in OT session. Pt demonstrated no pain during session.   Pt participated in ADL re-training while participating in bathing at shower level.  Functional transfers: Pt completed heel toe pivot transfer from bed to [facility] with Min A from elevated surface while utilizing RW. Completed same transfer technique with RW when transferring from [facility] to TTB in walk-in shower. When transferring out of shower, pt completed transfer with CGA while only SBA was needed to power up into  standing. VC provided for proper hand placement with RW management prior to sit to stand transition while VC were also provided to maintain weight bearing restrictions to LLE.   UB/LB bathing: Pt complete while seated requiring SBA and use of long handled spong. Pt completed lateral leans to wash buttocks.   UB dressing: Pt complete while seated requiring Min A to clasp bra in back.  LB dressing: Pt complete while seated then standing requiring Max A to donn incontinent brief, TED hose, and shoes. Pt able to thread both legs into pants and pull up over hips once standing at sink.     Session 2: Patient agreeable to participate in OT session. Reports 5/10 upper trapezius muscle soreness. Monitored during session.   Patient participated in skilled OT session focusing on UB strengthening and functional transfers.    Pt engaged in seated theraband Bean Bag Toss activity to facilitate UE ROM, strength, and coordination with use of red theraband for increased functional task performance. Pt performed activity twice through with full bean bag container. Demonstrated fatigue with increased activity. Pt provided with VC for form and technique. Total amount of points scored: 42, 36  Functional mobility:OT provided visual demonstration and VC to off load RLE when walking with RW. Pt was able to demonstrate carry over of education while walking 10 feet in therapy gym.  Toilet transfer: Pt completed toilet transfer utilizing RW and BSC over toilet with Min A to power up.  Toileting: Pt completed 2/3 toileting steps with Min A. Functional transfers: Pt completed all functional transfers with Min A to power up while utilizing RW and maintaining weight bearing restrictions  to RLE. VC provided for sequencing and technique as needed.    Therapy Documentation Precautions:  Precautions Precautions: Fall Recall of Precautions/Restrictions: Impaired Restrictions Weight Bearing Restrictions Per Provider  Order: Yes RLE Weight Bearing Per Provider Order: Touchdown weight bearing  Therapy/Group: Individual Therapy  Carollee Circle, OTR/L,CBIS  Supplemental OT - [facility] and [facility] Secure Chat Preferred   04/22/2024, 9:57 AM

## 2024-04-22 DIAGNOSIS — K5901 Slow transit constipation: Secondary | ICD-10-CM

## 2024-04-22 LAB — CBC
HCT: 30.5 % — ABNORMAL LOW (ref 36.0–46.0)
Hemoglobin: 9.4 g/dL — ABNORMAL LOW (ref 12.0–15.0)
MCH: 28.8 pg (ref 26.0–34.0)
MCHC: 30.8 g/dL (ref 30.0–36.0)
MCV: 93.6 fL (ref 80.0–100.0)
Platelets: 367 10*3/uL (ref 150–400)
RBC: 3.26 MIL/uL — ABNORMAL LOW (ref 3.87–5.11)
RDW: 15.9 % — ABNORMAL HIGH (ref 11.5–15.5)
WBC: 6 10*3/uL (ref 4.0–10.5)
nRBC: 0 % (ref 0.0–0.2)

## 2024-04-22 LAB — BASIC METABOLIC PANEL WITH GFR
Anion gap: 8 (ref 5–15)
BUN: 12 mg/dL (ref 8–23)
CO2: 22 mmol/L (ref 22–32)
Calcium: 9.1 mg/dL (ref 8.9–10.3)
Chloride: 110 mmol/L (ref 98–111)
Creatinine, Ser: 0.78 mg/dL (ref 0.44–1.00)
GFR, Estimated: >60 mL/min (ref >60)
Glucose, Bld: 105 mg/dL — ABNORMAL HIGH (ref 70–99)
Potassium: 3.7 mmol/L (ref 3.5–5.1)
Sodium: 140 mmol/L (ref 135–145)

## 2024-04-22 NOTE — Progress Notes (Signed)
 Patient ID: [name], female   DOB: May 20, 1930, 89 y.o.   MRN: [id number] Have left another message for [place] at [place] will await return call.

## 2024-04-22 NOTE — Progress Notes (Addendum)
 PROGRESS NOTE   Subjective/Complaints: Pt in good spirits. Sitting eob. Says stitches are supposed to come out today. Moving bowels. Pain is controlled  ROS: Patient denies fever, rash, sore throat, blurred vision, dizziness, nausea, vomiting, diarrhea, cough, shortness of breath or chest pain,   headache, or mood change.   Objective:   No results found.   Recent Labs    04/22/24 0649  WBC 6.0  HGB 9.4*  HCT 30.5*  PLT 367     Recent Labs    04/22/24 0649  NA 140  K 3.7  CL 110  CO2 22  GLUCOSE 105*  BUN 12  CREATININE 0.78  CALCIUM  9.1      Intake/Output Summary (Last 24 hours) at 04/22/2024 1003 Last data filed at 04/22/2024 0752 Gross per 24 hour  Intake 1057 ml  Output 400 ml  Net 657 ml        Physical Exam: Vital Signs Blood pressure (!) 145/69, pulse 69, temperature 97.6 F (36.4 C), temperature source Oral, resp. rate 18, height 5\' 10"  (1.778 m), weight 108.8 kg, SpO2 100%.   Constitutional: No distress . Vital signs reviewed. HEENT: NCAT, EOMI, oral membranes moist Neck: supple Cardiovascular: RRR without murmur. No JVD    Respiratory/Chest: CTA Bilaterally without wheezes or rales. Normal effort    GI/Abdomen: BS +, non-tender, non-distended Ext: no clubbing, cyanosis, or edema Psych: pleasant and cooperative  Skin: Right hip incisions CDI with sutures, appropriately tender    Neuro:    Mental Status: AAOx3, follows commands Speech/Languate: fluent CRANIAL NERVES: Grossly intact  Follows full commands. Decreased to light touch in hands B/L- not rest of Ue's- B/L as well as L1 to S2 B/L- worse in LE's-   Ue's 5-/5 LLE 5-/5 throughout RLE_ HF 4/5; KE 5-/5 and DF/PF 5/5-   MSK: R hip moderate swelling is improving    Assessment/Plan: 1. Functional deficits which require 3+ hours per day of interdisciplinary therapy in a comprehensive inpatient rehab setting. Physiatrist is  providing close team supervision and 24 hour management of active medical problems listed below. Physiatrist and rehab team continue to assess barriers to discharge/monitor patient progress toward functional and medical goals  Care Tool:  Bathing    Body parts bathed by patient: Right arm, Left arm, Chest, Abdomen, Right upper leg, Left upper leg, Face, Front perineal area, Right lower leg, Left lower leg, Buttocks   Body parts bathed by helper: Buttocks     Bathing assist Assist Level: Minimal Assistance - Patient > 75%     Upper Body Dressing/Undressing Upper body dressing   What is the patient wearing?: Bra, Pull over shirt    Upper body assist Assist Level: Minimal Assistance - Patient > 75%    Lower Body Dressing/Undressing Lower body dressing      What is the patient wearing?: Incontinence brief, Pants     Lower body assist Assist for lower body dressing: Minimal Assistance - Patient > 75%     Toileting Toileting    Toileting assist Assist for toileting: Total Assistance - Patient < 25%     Transfers Chair/bed transfer  Transfers assist     Chair/bed transfer assist  level: Contact Guard/Touching assist     Locomotion Ambulation   Ambulation assist   Ambulation activity did not occur: Safety/medical concerns (pain/fatigue)  Assist level: 2 helpers Assistive device: Walker-rolling Max distance: 8   Walk 10 feet activity   Assist  Walk 10 feet activity did not occur: Safety/medical concerns (pain/fatigue)        Walk 50 feet activity   Assist Walk 50 feet with 2 turns activity did not occur: Safety/medical concerns         Walk 150 feet activity   Assist Walk 150 feet activity did not occur: Safety/medical concerns         Walk 10 feet on uneven surface  activity   Assist Walk 10 feet on uneven surfaces activity did not occur: Safety/medical concerns         Wheelchair     Assist Is the patient using a wheelchair?:  Yes Type of Wheelchair: Manual    Wheelchair assist level: Independent Max wheelchair distance: 150    Wheelchair 50 feet with 2 turns activity    Assist        Assist Level: Independent   Wheelchair 150 feet activity     Assist      Assist Level: Independent   Blood pressure (!) 145/69, pulse 69, temperature 97.6 F (36.4 C), temperature source Oral, resp. rate 18, height 5\' 10"  (1.778 m), weight 108.8 kg, SpO2 100%.   Medical Problem List and Plan: 1. Functional deficits secondary to right supracondylar periprosthetic distal femur fracture.  Status post open reduction internal fixation 04/04/2024 per Dr. Guyann Leitz. TDWB right lower extremity for 4-6 weeks             -patient may  shower             -ELOS/Goals: 5/21, Supervision to min A             - Seen by orthopedics, nonweightbearing right leg x 4 more weeks-DC sutures day or 2 before discharge, continue ASA daily at discharge?- she is on eliquis - confirmed with [name], TD WB ok    2.  Antithrombotics: -DVT/anticoagulation:  Pharmaceutical: Eliquis .  Venous Doppler study negative.             -antiplatelet therapy: N/A 3. Pain Management: Lyrica  25 mg BID,, oxycodone  as needed- more pain from peripheral neuropathy than her RLE- mainly L ankle per pt  -5/8 occasional use of oxycodone , continue current regimen  - 5/9 pain improving infrequent use of as needed oxycodone   5/16 discussed asking for medication about an hour before 1st therapy. She reports chronic back pain  5/20 pain controlled  4. Mood/Behavior/Sleep: Zoloft  50 mg daily, trazodone  150 mg nightly.  Provide emotional support  5/10- needed help to deal with grief of being in hospital right now             -antipsychotic agents: N/A 5. Neuropsych/cognition: This patient is capable of making decisions on her own behalf. 6. Skin/Wound Care:    -5/19 remove sutures today 7. Fluids/Electrolytes/Nutrition: Routine in and outs with follow-up  chemistries 8.  Acute blood loss anemia as well as history of normocytic anemia.  Continue iron supplement.  Follow-up CBC  - 5/7 HGB stable 8.4  -5/12 hemoglobin a little lower at 7.9, check stool blood test-neg  -5/19 hgb up to 9.4 9.  Transient atrial fibrillation with RVR Lopressor  25 mg twice daily.  Follow-up cardiology services.  Continue Eliquis  until further notice..  - 5/19-20  HR stable       04/22/2024    4:19 AM 04/22/2024    4:17 AM 04/21/2024    7:51 PM  Vitals with BMI  Weight 239 lbs 14 oz    BMI 34.42    Systolic  145 130  Diastolic  69 61  Pulse  69 80    10.  Obesity.  BMI 30.85.  Dietary follow-up  5/10- BMI up to 34.51- will follow weight because last weight was significantly less  11.  Acute on Chronic Constipation.  MiraLAX  twice daily, Senokot daily, Colace 100 mg daily.  - Senna 2 tabs daily and miralax  daily prn along with daily    -LBM 5/19, continue current 12.AKI.Resolved.Follow up chemestries  5/7 BUN/Cr stable, continue to monitor  5/12 creatinine BUN stable at 0.69/14  5/20 stable BUN/Cr yesterday 13. Hypokalemia  -5/12  K+ today  -5/19 3.7 14. Frequent urination  -5/12 U/A negative, symptoms have improved     LOS: 13 days A FACE TO FACE EVALUATION WAS PERFORMED  Rawland Caddy 04/22/2024, 10:03 AM

## 2024-04-22 NOTE — Progress Notes (Signed)
 Sutures removed on RLE per MD orders. Patient tolerated well. No redness swelling or signs of infections. Needs met at this time, bed in lowest position, call light in reach.

## 2024-04-22 NOTE — Progress Notes (Signed)
 Occupational Therapy Session Note  Patient Details  Name: [name] MRN: [id number] Date of Birth: [birth year]  Today's Date: 04/23/2024 OT Individual Time: 9629-5284 OT Individual Time Calculation (min): 71 min    Short Term Goals: Week 3:  OT Short Term Goal 1 (Week 3): STG = LTG (d/t ELOS)  Skilled Therapeutic Interventions/Progress Updates:    Patient agreeable to participate in OT session. Reports 0/10 pain level although during session verbalized mild R upper arm soreness Monitored during session.   Patient participated in skilled OT session focusing on ADL re-training, functional transfers, and UB strength/endurance.   Completed all functional transfers using step pivot technique while maintaining weight bearing restrictions to RLE. Required Min A to power up from recliner then CGA to complete transfer. While completing toilet transfer on/off, pt required CGA only while using RW.   Pt completed toileting with CGA while able to demonstrate 3/3 tasks this date!  Pt participated in Wii sports game including two rounds of bowling with task focused on leveraging external cues for coordination and amplitude of movement, increasing retention and transference of motor learning, reactive sitting balance in order to increase overall functional performance during self-care tasks when discharged home. Increased VC and visual cues were required initially for direction and sequencing.   At end of session, pt requested to lay down in bed completing step pivot transfer from [facility] to bed with CGA and RW use. Bed mobility completed with SBA while transitioning from sitting to supine.   Therapy Documentation Precautions:  Precautions Precautions: Fall Recall of Precautions/Restrictions: Impaired Restrictions Weight Bearing Restrictions Per Provider Order: Yes RLE Weight Bearing Per Provider Order: Touchdown weight bearing   Therapy/Group: Individual Therapy  Carollee Circle, OTR/L,CBIS   Supplemental OT - [facility] Secure Chat Preferred   04/23/2024, 8:01 AM

## 2024-04-22 NOTE — Progress Notes (Signed)
 Physical Therapy Session Note  Patient Details  Name: [name] MRN: [id number] Date of Birth: [birth year]  Today's Date: 04/22/2024 PT Individual Time: 1103-1200 PT Individual Time Calculation (min): 57 min   Short Term Goals: Week 2:  PT Short Term Goal 1 (Week 2): STG=LTG 2/2 ELOS  Skilled Therapeutic Interventions/Progress Updates:      Pt supine in bed upon arrival. Pt agreeable to therpay. Pt denies any pain.   Pt perofmred stand pivot transfer WC to BSC over toilet with RW and min A for power up.   Pt stood and doffed pants with RW and min A. Pt stood and donned pants with RW and min A, pt required seated rest break 2/2 fatigue.  Pt continent of bowel. Pt performed pericare while seated with set up assist. Verbal cues provided for initiation of rest break for pt safety2/2 fatigue. After rest break pt performed stand pivot transfer BSC to WC with RW and CGA.   Pt self propelled WC to sink and washed hands with mod I.   Pt ambulated 2x10 feet in // bars with CGA, pt demos improved carry over of gait with RW, 1x5, 1x8, 1x10 feet with RW and min A progressing to CGA with WC in tow, verbal cues provided for sequencing and pushing through RW for advancement of L LE.   Pt performed sit to stand from [facility] throughout session with min A progressing to CGA, verbal cues provided for technique.   Pt reports urgent need to use bathroom near end of session. Opted to use steady 2/2 urgency and time conservation. Pt continent of bowel, pt performed sit to stand with steady and supervision. Pt donned pants with intermittent min A.   Pt supine in bed at end of session with all needs within reach and bed alarm on.           Therapy Documentation Precautions:  Precautions Precautions: Fall Recall of Precautions/Restrictions: Impaired Restrictions Weight Bearing Restrictions Per Provider Order: Yes RLE Weight Bearing Per Provider Order: Touchdown weight bearing  Therapy/Group:  Individual Therapy  [facility] Jenney Modest, , DPT  04/22/2024, 7:45 AM

## 2024-04-23 DIAGNOSIS — I4891 Unspecified atrial fibrillation: Secondary | ICD-10-CM

## 2024-04-23 NOTE — Plan of Care (Signed)
 Discontinued home ambulation goal as not antiicpating pt to be functional ambulator at discharge.  Problem: RH Ambulation Goal: LTG Patient will ambulate in home environment (PT) Description: LTG: Patient will ambulate in home environment, # of feet with assistance (PT). 04/23/2024 1608 by Jenney Modest, PT Outcome: Not Applicable  04/23/2024 0925 by Jenney Modest, PT Outcome: Not Applicable

## 2024-04-23 NOTE — Plan of Care (Signed)
  Problem: RH Toileting Goal: LTG Patient will perform toileting task (3/3 steps) with assistance level (OT) Description: LTG: Patient will perform toileting task (3/3 steps) with assistance level (OT)  Flowsheets (Taken 04/23/2024 1611) LTG: Pt will perform toileting task (3/3 steps) with assistance level: Contact Guard/Touching assist Note: Goal downgraded 5/20

## 2024-04-23 NOTE — Plan of Care (Signed)
  Problem: RH Ambulation Goal: LTG Patient will ambulate in home environment (PT) Description: LTG: Patient will ambulate in home environment, # of feet with assistance (PT). 04/23/2024 0927 by Jenney Modest, PT Flowsheets (Taken 04/23/2024 [id number]) LTG: Pt will ambulate in home environ  assist needed:: (downgraded for pt overall safety/stability) Minimal Assistance - Patient > 75%  Problem: Sit to Stand Goal: LTG:  Patient will perform sit to stand with assistance level (PT) Description: LTG:  Patient will perform sit to stand with assistance level (PT) Flowsheets (Taken 04/23/2024 0925) LTG: PT will perform sit to stand in preparation for functional mobility with assistance level: (downgraded for pt overall safety/stability) Contact Guard/Touching assist   Problem: RH Bed to Chair Transfers Goal: LTG Patient will perform bed/chair transfers w/assist (PT) Description: LTG: Patient will perform bed to chair transfers with assistance (PT). Flowsheets (Taken 04/23/2024 0925) LTG: Pt will perform Bed to Chair Transfers with assistance level: (downgraded for pt overall safety/stability) Contact Guard/Touching assist   Problem: RH Ambulation Goal: LTG Patient will ambulate in controlled environment (PT) Description: LTG: Patient will ambulate in a controlled environment, # of feet with assistance (PT). Flowsheets (Taken 04/23/2024 0925) LTG: Pt will ambulate in controlled environ  assist needed:: (downgraded for pt overall safety/stability) Contact Guard/Touching assist

## 2024-04-23 NOTE — Progress Notes (Addendum)
 PROGRESS NOTE   Subjective/Complaints: No new concerns or complaints today.   ROS: Patient denies fever, new vision changes, dizziness, nausea, vomiting, diarrhea,  shortness of breath or chest pain, headache, or mood change.   Objective:   No results found.   Recent Labs    04/22/24 0649  WBC 6.0  HGB 9.4*  HCT 30.5*  PLT 367     Recent Labs    04/22/24 0649  NA 140  K 3.7  CL 110  CO2 22  GLUCOSE 105*  BUN 12  CREATININE 0.78  CALCIUM  9.1      Intake/Output Summary (Last 24 hours) at 04/23/2024 1258 Last data filed at 04/23/2024 0456 Gross per 24 hour  Intake 355 ml  Output 550 ml  Net -195 ml        Physical Exam: Vital Signs Blood pressure (!) 163/77, pulse 70, temperature (!) 97.5 F (36.4 C), resp. rate 18, height 5\' 10"  (1.778 m), weight 109.2 kg, SpO2 96%.   Constitutional: No distress . Vital signs reviewed. HEENT: NCAT, EOMI, oral membranes moist Neck: supple Cardiovascular: RRR without murmur. No JVD    Respiratory/Chest: CTA Bilaterally without wheezes or rales. Normal effort    GI/Abdomen: BS +, non-tender, non-distended Ext: no clubbing, cyanosis, or edema Psych: pleasant and cooperative  Skin: Right hip incisions CDI with sutures, appropriately tender    Neuro:    Mental Status: AAOx3, follows commands Speech/Languate: fluent CRANIAL NERVES: Grossly intact  Follows full commands. Decreased to light touch in hands B/L- not rest of Ue's- B/L as well as L1 to S2 B/L- worse in LE's-   Ue's 5-/5 LLE 5-/5 throughout RLE_ HF 4/5; KE 5-/5 and DF/PF 5/5-   MSK: R hip moderate swelling is improving    Assessment/Plan: 1. Functional deficits which require 3+ hours per day of interdisciplinary therapy in a comprehensive inpatient rehab setting. Physiatrist is providing close team supervision and 24 hour management of active medical problems listed below. Physiatrist and  rehab team continue to assess barriers to discharge/monitor patient progress toward functional and medical goals  Care Tool:  Bathing    Body parts bathed by patient: Right arm, Left arm, Chest, Abdomen, Right upper leg, Left upper leg, Face, Front perineal area, Right lower leg, Left lower leg, Buttocks   Body parts bathed by helper: Buttocks     Bathing assist Assist Level: Minimal Assistance - Patient > 75%     Upper Body Dressing/Undressing Upper body dressing   What is the patient wearing?: Bra, Pull over shirt    Upper body assist Assist Level: Minimal Assistance - Patient > 75%    Lower Body Dressing/Undressing Lower body dressing      What is the patient wearing?: Incontinence brief, Pants     Lower body assist Assist for lower body dressing: Minimal Assistance - Patient > 75%     Toileting Toileting    Toileting assist Assist for toileting: Total Assistance - Patient < 25%     Transfers Chair/bed transfer  Transfers assist     Chair/bed transfer assist level: Contact Guard/Touching assist     Locomotion Ambulation   Ambulation assist   Ambulation activity  did not occur: Safety/medical concerns (pain/fatigue)  Assist level: Contact Guard/Touching assist Assistive device: Walker-rolling Max distance: 10   Walk 10 feet activity   Assist  Walk 10 feet activity did not occur: Safety/medical concerns (pain/fatigue)  Assist level: Contact Guard/Touching assist Assistive device: Walker-rolling   Walk 50 feet activity   Assist Walk 50 feet with 2 turns activity did not occur: Safety/medical concerns (pain/fatigue)         Walk 150 feet activity   Assist Walk 150 feet activity did not occur: Safety/medical concerns         Walk 10 feet on uneven surface  activity   Assist Walk 10 feet on uneven surfaces activity did not occur: Safety/medical concerns         Wheelchair     Assist Is the patient using a wheelchair?:  Yes Type of Wheelchair: Manual    Wheelchair assist level: Independent Max wheelchair distance: 150    Wheelchair 50 feet with 2 turns activity    Assist        Assist Level: Independent   Wheelchair 150 feet activity     Assist      Assist Level: Independent   Blood pressure (!) 163/77, pulse 70, temperature (!) 97.5 F (36.4 C), resp. rate 18, height 5\' 10"  (1.778 m), weight 109.2 kg, SpO2 96%.   Medical Problem List and Plan: 1. Functional deficits secondary to right supracondylar periprosthetic distal femur fracture.  Status post open reduction internal fixation 04/04/2024 per Dr. Guyann Leitz. TDWB right lower extremity for 4-6 weeks             -patient may  shower             -ELOS/Goals: 5/21, Supervision to min A             - Seen by orthopedics, nonweightbearing right leg x 4 more weeks-DC sutures day or 2 before discharge, continue ASA daily at discharge?- she is on eliquis - confirmed with [name], TD WB ok  -Social work working on [facility] [place]  2.  Antithrombotics: -DVT/anticoagulation:  Pharmaceutical: Eliquis .  Venous Doppler study negative.             -antiplatelet therapy: N/A 3. Pain Management: Lyrica  25 mg BID,, oxycodone  as needed- more pain from peripheral neuropathy than her RLE- mainly L ankle per pt  -5/8 occasional use of oxycodone , continue current regimen  - 5/9 pain improving infrequent use of as needed oxycodone   5/16 discussed asking for medication about an hour before 1st therapy. She reports chronic back pain  5/20 pain controlled  4. Mood/Behavior/Sleep: Zoloft  50 mg daily, trazodone  150 mg nightly.  Provide emotional support  5/10- needed help to deal with grief of being in hospital right now             -antipsychotic agents: N/A 5. Neuropsych/cognition: This patient is capable of making decisions on her own behalf. 6. Skin/Wound Care:    -5/19 remove sutures today 7. Fluids/Electrolytes/Nutrition: Routine in and  outs with follow-up chemistries 8.  Acute blood loss anemia as well as history of normocytic anemia.  Continue iron supplement.  Follow-up CBC  - 5/7 HGB stable 8.4  -5/12 hemoglobin a little lower at 7.9, check stool blood test-neg  -5/19 hgb up to 9.4 9.  Transient atrial fibrillation with RVR Lopressor  25 mg twice daily.  Follow-up cardiology services.  Continue Eliquis  until further notice..  - 5/19-20 HR stable  04/23/2024    5:00 AM 04/23/2024    4:57 AM 04/22/2024    8:17 PM  Vitals with BMI  Weight 240 lbs 12 oz    BMI 34.54    Systolic  163 164  Diastolic  77 86  Pulse  70     10.  Obesity.  BMI 30.85.  Dietary follow-up  5/10- BMI up to 34.51- will follow weight because last weight was significantly less  11.  Acute on Chronic Constipation.  MiraLAX  twice daily, Senokot daily, Colace 100 mg daily.  - Senna 2 tabs daily and miralax  daily prn along with daily    -LBM 5/19, continue current 12.AKI.Resolved.Follow up chemestries  5/7 BUN/Cr stable, continue to monitor  5/12 creatinine BUN stable at 0.69/14  5/20 stable BUN/Cr yesterday 13. Hypokalemia  -5/12  K+ today  -5/19 3.7 14. Frequent urination  -5/12 U/A negative, symptoms have improved     LOS: 14 days A FACE TO FACE EVALUATION WAS PERFORMED  Lylia Sand 04/23/2024, 12:58 PM

## 2024-04-23 NOTE — Progress Notes (Addendum)
 Patient ID: [name], female   DOB: 09-25-1930, 89 y.o.   MRN: [id number] Met with pt to let know have not heard back from [facility] asked for other choices. She reports check with [name] she is agreeable to this. Have sent FL2 to them. Aware not going tomorrow until bed found  2:07 PM Awaiting response from facilities regarding offers. Pt is aware and will stay here until can find bed and transfer.

## 2024-04-23 NOTE — Progress Notes (Addendum)
 Physical Therapy Session Note  Patient Details  Name: [name] MRN: [id number] Date of Birth: 09-23-1930  Today's Date: 04/23/2024 PT Individual Time: [phone], 1102-1202 PT Individual Time Calculation (min): 57 min 60 min   Short Term Goals: Week 2:  PT Short Term Goal 1 (Week 2): STG=LTG 2/2 ELOS  Skilled Therapeutic Interventions/Progress Updates:     Treatment Session 1  Pt supine in bed upon arrival. Pt agreeable to therapy. Pt denies any pain.   Pt performed supine to sit with mod I with use of bed features.   Pt seated EOB donned ted hose B with total A, bra and shirt with set up assist, pants with supervision to feed through legs while seated EOB and min  A to pull up over buttocks while standing with RW and CGA/min A.   Pt performed sit to stand with RW and CGA.   Pt ambuled 1x13, 2x16 feet with RW and CGA/min A, verbal/tactile cues and demonstration provided for pushing B UE through RW for improved L LE clearance. Pt demonstrates improved technique with repetition  Pt self propellled WC 200+ feet with B UE and L LE with mod I.   Pt performed stand pivot transfer WC to RW with mod A for power up 2/2 fatiuge and CGA with pivot.   Pt seated in recliner with all needs within reach and chair alarm on.   Treatment Session 2   Pt seated in recliner upon arrival with son matthew in the room. Pt agreeable to therapy. Pt denies any pain.   Of note: conflicting orders in ortho PA note 5/16, and MD note 5/19 R LE NWB. PT verified via secure chat with ortho PA and MD R LE TDWBing okay.   Pt transported dependent in [facility] outside. Pt donned/doffed leg rests with min A progressing to supervision with repeated trials, verbal and tactile cues provided for technique.   Pt self propelled WC with B UE and L LE on uneven terrain outside x100 feet with intermittent min A on unlevel surfaces progressing to supervision, verbal cues provided for technique with steering and controlling  speed.   Pt self propelled WC throughout rehab apartment to pick up cones with use of reacher at various heights from [facility]. Discussed items appropriate to pick up with reacher. Recommend pt move commonly used items inaccessible with reacher to height between pt hips and shoulders. Pt navigated WC while opening/closing fridge.   Pt requesting to use bahtroom. Pt performed stand pivot transfer WC to BSC over toilet with min A for power up 2/2 fatigue. Pt continent of bladder, pt performed pericare while seated with supervision. Utilized steady for [facility] to recliner for time/energy conservation 2/2 time constraints. Pt seated in recliner with all needs within reach and bed alarm on.      Therapy Documentation Precautions:  Precautions Precautions: Fall Recall of Precautions/Restrictions: Impaired Restrictions Weight Bearing Restrictions Per Provider Order: Yes RLE Weight Bearing Per Provider Order: Touchdown weight bearing  Therapy/Group: Individual Therapy  [facility], [place], DPT  04/23/2024, 7:58 AM

## 2024-04-24 DIAGNOSIS — D649 Anemia, unspecified: Secondary | ICD-10-CM

## 2024-04-24 MED ORDER — TRAZODONE HCL 150 MG PO TABS: 150.0000 mg | ORAL_TABLET | Freq: Every day | ORAL | Status: AC

## 2024-04-24 MED ORDER — OXYCODONE HCL 5 MG PO TABS: 5.0000 mg | ORAL_TABLET | Freq: Four times a day (QID) | ORAL | Status: DC | PRN

## 2024-04-24 MED ORDER — OXYCODONE HCL 5 MG PO TABS: 5.0000 mg | ORAL_TABLET | Freq: Four times a day (QID) | ORAL | 0 refills | Status: AC | PRN

## 2024-04-24 MED ORDER — ADULT MULTIVITAMIN W/MINERALS CH
1.0000 | ORAL_TABLET | Freq: Every day | ORAL | Status: AC
Start: 2024-04-25 — End: ?

## 2024-04-24 NOTE — Progress Notes (Signed)
 Occupational Therapy Session Note  Patient Details  Name: [name] MRN: [id number] Date of Birth: Mar 26, 1930  Today's Date: 04/24/2024 OT Individual Time: 8657-8469 OT Individual Time Calculation (min): 40 min    Short Term Goals: Week 3:  OT Short Term Goal 1 (Week 3): STG = LTG (d/t ELOS)  Skilled Therapeutic Interventions/Progress Updates:  Skilled OT intervention completed with focus on ADL retraining, functional endurance, and mobility within a shower context. Pt received seated EOB requesting a shower. No pain reported.  Pt completed supervision sit > stands in stedy for time management from EOB <> TTB in shower, then CGA sit > stand with RW and CGA short ambulatory transfer with RW > recliner with cues needed for all standing to maintain TDWB on RLE. Pt appeared effortful but difficulty maintaining.  Moderate SOB with exertion but recovered with rest.  Pt able to doff all clothing except brief and TEDs in sitting via lateral leans. Dependent for doffing TEDs and stood in stedy to remove brief with mod A. Pt was able to bathe all parts with intermittent supervision, at the seated level only. Utilized long handled sponge to access BLE and periareas as applicable.  At EOB, pt was able to donn deo, powder, bra and shirt with set up A. Education provided on threading weaker RLE first into pants for efficiency. Able to do so with supervision. Donned TEDs/shoes with max A. Utilized RW for standing to donn pants over hips. Transfer to w/c. Set up A for hair/facial grooming.  Pt remained seated in recliner, with chair alarm on/activated, and with all needs in reach at end of session.   Therapy Documentation Precautions:  Precautions Precautions: Fall Recall of Precautions/Restrictions: Impaired Restrictions Weight Bearing Restrictions Per Provider Order: Yes RLE Weight Bearing Per Provider Order: Touchdown weight bearing     Therapy/Group: Individual Therapy  Ruthanna Covert, MS, OTR/L  04/24/2024, 1:47 PM

## 2024-04-24 NOTE — Plan of Care (Signed)
  Problem: Consults Goal: RH GENERAL PATIENT EDUCATION Description: See Patient Education module for education specifics. Outcome: Progressing   Problem: RH BOWEL ELIMINATION Goal: RH STG MANAGE BOWEL WITH ASSISTANCE Description: STG Manage Bowel with mod I Assistance. Outcome: Progressing   Problem: RH BOWEL ELIMINATION Goal: RH STG MANAGE BOWEL W/MEDICATION W/ASSISTANCE Description: STG Manage Bowel with Medication with mod I Assistance. Outcome: Progressing   Problem: RH BLADDER ELIMINATION Goal: RH STG MANAGE BLADDER WITH ASSISTANCE Description: STG Manage Bladder With toileting Assistance Outcome: Progressing   Problem: RH SKIN INTEGRITY Goal: RH STG SKIN FREE OF INFECTION/BREAKDOWN Description: Manage w min assist Outcome: Progressing   Problem: RH KNOWLEDGE DEFICIT GENERAL Goal: RH STG INCREASE KNOWLEDGE OF SELF CARE AFTER HOSPITALIZATION Description: Patient and family will be able to manage care at discharge using educational resources for medications, skin care, etc independently Outcome: Progressing   Problem: RH PAIN MANAGEMENT Goal: RH STG PAIN MANAGED AT OR BELOW PT'S PAIN GOAL Description: < 4 with prns Outcome: Progressing

## 2024-04-24 NOTE — Progress Notes (Signed)
 Physical Therapy Discharge Summary  Patient Details  Name: [name] MRN: [id number] Date of Birth: 01-24-1930  Date of Discharge from PT service:Apr 24, 2024  Today's Date: 04/24/2024 PT Individual Time: 4098-1191, 4782-9562 PT Individual Time Calculation (min): 72 min, 71 min    Patient has met 6 of 6 long term goals due to improved activity tolerance, improved balance, increased strength, increased range of motion, decreased pain, ability to compensate for deficits, improved attention, and improved awareness.  Patient to discharge to SNF at a wheelchair level CGA/min A.     Recommendation:  Patient will benefit from ongoing skilled PT services in home health setting to continue to advance safe functional mobility, address ongoing impairments in strength, ROM, balance, gait, and minimize fall risk.  Equipment: Defer to SNF recommendations   Reasons for discharge: treatment goals met and discharge from hospital  Patient/family agrees with progress made and goals achieved: Yes  PT Discharge Precautions/Restrictions Precautions Precautions: Fall Recall of Precautions/Restrictions: Intact Restrictions Weight Bearing Restrictions Per Provider Order: Yes RLE Weight Bearing Per Provider Order: Touchdown weight bearing Pain Interference Pain Interference Pain Effect on Sleep: 1. Rarely or not at all Pain Interference with Therapy Activities: 1. Rarely or not at all Pain Interference with Day-to-Day Activities: 1. Rarely or not at all Vision/Perception  Vision - History Ability to See in Adequate Light: 0 Adequate Perception Perception: Within Functional Limits Praxis Praxis: WFL  Cognition Overall Cognitive Status: Within Functional Limits for tasks assessed Arousal/Alertness: Awake/alert Orientation Level: Oriented X4 Memory: Appears intact Awareness: Appears intact Problem Solving: Appears intact Safety/Judgment: Appears intact Sensation Sensation Additional  Comments: Reports baseline severe neuropathy in BUE and B LE Coordination Gross Motor Movements are Fluid and Coordinated: No Fine Motor Movements are Fluid and Coordinated: Yes Coordination and Movement Description: grossly limited by pain and weakness and R LE TDWBing Finger Nose Finger Test: NT Motor  Motor Motor: Other (comment) Motor - Skilled Clinical Observations: generalized weakness and pain post fall/R LE supracondylar fracture  Mobility Bed Mobility Bed Mobility: Rolling Right;Rolling Left;Supine to Sit;Sit to Supine Rolling Right: Independent with assistive device Rolling Left: Independent with assistive device Supine to Sit: Independent with assistive device Sitting - Scoot to Edge of Bed: Independent with assistive device Sit to Supine: Independent with assistive device Transfers Transfers: Sit to Stand;Stand to Sit;Stand Pivot Transfers Sit to Stand: Contact Guard/Touching assist;Minimal Assistance - Patient > 75% Stand to Sit: Contact Guard/Touching assist Stand Pivot Transfers: Contact Guard/Touching assist;Minimal Assistance - Patient > 75% (level of assist fluctuates with pain, fatigue, and height of surface pt standing from) Transfer (Assistive device): Rolling walker Locomotion  Gait Ambulation: Yes Gait Assistance: Contact Guard/Touching assist Gait Distance (Feet): 16 Feet Assistive device: Rolling walker Gait Gait: Yes Gait Pattern: Impaired Gait Pattern: Step-through pattern (antalgic, RLE TDWBing) Stairs / Additional Locomotion Stairs: No Corporate treasurer: Yes Wheelchair Assistance: Independent with Scientist, research (life sciences): Both upper extremities Wheelchair Parts Management: Supervision/cueing Distance: 150+  Trunk/Postural Assessment  Cervical Assessment Cervical Assessment: Within Functional Limits Thoracic Assessment Thoracic Assessment: Exceptions to [facility] (rounded shoulders) Lumbar Assessment Lumbar  Assessment: Exceptions to [facility] (posterior pelvic tilt) Postural Control Postural Control: Deficits on evaluation  Balance Balance Balance Assessed: Yes Static Sitting Balance Static Sitting - Balance Support: No upper extremity supported;Feet supported Static Sitting - Level of Assistance: 6: Modified independent (Device/Increase time) Dynamic Sitting Balance Dynamic Sitting - Level of Assistance: 5: Stand by assistance Static Standing Balance Static Standing - Balance Support: Bilateral  upper extremity supported;During functional activity Static Standing - Level of Assistance: 5: Stand by assistance (supervisino) Dynamic Standing Balance Dynamic Standing - Balance Support: Bilateral upper extremity supported;During functional activity Dynamic Standing - Level of Assistance: 5: Stand by assistance (CGA) Extremity Assessment  RLE Assessment RLE Assessment: Exceptions to [facility] General Strength Comments: grossly 2+/5--limited by pain and weakness and edema LLE Assessment LLE Assessment: Within Functional Limits  Today's Interventions   Treatment Session 1  Pt seated EOB upon arrival. Pt agreeable to therapy. Pt denies any pain at start of session but reports 6/10 knee pain at end of session, requesting pain medicine. Notified nurse.   Pt reports increased fatigue this session 2/2 to sitting EOB since 7 am. Education provided regarding energy conservation and positioning for comfort/pain management. Pt verbalized understanding and agreeable.   Pt donned bra and shirt with set up assist, and pants with A while seated EOB.   Pt performed sit to stand and stand pivot transfer with RW and CGA/min A (towards end of session with increased pain/fatigue).   Reviewed and performed the following HEP, handout provided:   1x10 R LE quad sets  1x10 R LE heel slides  1x10 R LE LAQ   1x10 ankle pumps B  Pt also performed the following exercises however therpaist opted not to include them in HEP  for pt safety and adherence to precautions  1x5 single leg glute bridge   1x10 active assisted R LE SLR   1x10 sidelying clamshells   Pt performed stand pivot transfer throughout session with RW and CGA/min A with fatigue.   Pt supine in bed with all needs within reach and bed alarm on.   Treatment Session 2   Pt seated in recliner upon arrival. Pt agreeable to therapy. Pt denies any pain. Pt in very pleasant mood after shower.   Pt reports increased R LE edema today. Pt wearing ted hose. Pt reports sitting EOB for hours this AM, pt has been sitting dependent in recliner since eval. Education provided regarding improtance of elevating R LE for edema management. Pt verbalized understanding. Pt elevated R LE on elevating leg rest during session, and elevated R LE on pillow at end of session.   Pt performed stand pivot transfer WC to car simualtor with min A with RW, verbal cues provided for technique.    Extensive education provided regarding energy conservation technique, handout provided.   Utilized steady at end of session for toilet and bed transfer 2/2 pt fatigue.   Pt supine in bed at end of session with all needs within reach and bed alarm on.     [facility], [place], DPT  04/24/2024, 10:03 AM

## 2024-04-24 NOTE — Progress Notes (Addendum)
 Patient ID: [name], female   DOB: Aug 05, 1930, 89 y.o.   MRN: [id number]  Have reached out to [facility] and [facility] awaiting return call/text.   9:14 am Spoke with [facility] can offer a bed for tomorrow will meet with pt to let know  9:30 am Met with pt and she has accepted bed offer will work on transfer for tomorrow. She will let son's know. Team and MD/PA made aware of plan for tomorrow.  1:37 PM Updated on team conference and progress this week. Looking forward to going tomorrow. Scheduled [facility] for 11:00

## 2024-04-24 NOTE — Plan of Care (Signed)
  Problem: Sit to Stand Goal: LTG:  Patient will perform sit to stand in prep for activites of daily living with assistance level (OT) Description: LTG:  Patient will perform sit to stand in prep for activites of daily living with assistance level (OT) Outcome: Completed/Met   Problem: RH Dressing Goal: LTG Patient will perform lower body dressing w/assist (OT) Description: LTG: Patient will perform lower body dressing with assist, with/without cues in positioning using equipment (OT) Outcome: Completed/Met   Problem: RH Toileting Goal: LTG Patient will perform toileting task (3/3 steps) with assistance level (OT) Description: LTG: Patient will perform toileting task (3/3 steps) with assistance level (OT)  Outcome: Completed/Met   Problem: RH Toilet Transfers Goal: LTG Patient will perform toilet transfers w/assist (OT) Description: LTG: Patient will perform toilet transfers with assist, with/without cues using equipment (OT) Outcome: Completed/Met   Problem: RH Tub/Shower Transfers Goal: LTG Patient will perform tub/shower transfers w/assist (OT) Description: LTG: Patient will perform tub/shower transfers with assist, with/without cues using equipment (OT) Outcome: Completed/Met

## 2024-04-24 NOTE — Progress Notes (Signed)
 PROGRESS NOTE   Subjective/Complaints: No new concerns or complaints today. DC planned for tomorrow.   ROS: Patient denies fever, new vision changes, dizziness, nausea, vomiting, diarrhea,  shortness of breath or chest pain, headache, or mood change.    Objective:   No results found.   Recent Labs    04/22/24 0649  WBC 6.0  HGB 9.4*  HCT 30.5*  PLT 367     Recent Labs    04/22/24 0649  NA 140  K 3.7  CL 110  CO2 22  GLUCOSE 105*  BUN 12  CREATININE 0.78  CALCIUM  9.1      Intake/Output Summary (Last 24 hours) at 04/24/2024 0856 Last data filed at 04/24/2024 0723 Gross per 24 hour  Intake 1070 ml  Output 1346 ml  Net -276 ml        Physical Exam: Vital Signs Blood pressure 137/71, pulse 66, temperature 97.8 F (36.6 C), resp. rate 17, height 5\' 10"  (1.778 m), weight 110 kg, SpO2 97%.   Constitutional: No distress . Vital signs reviewed. HEENT: NCAT, EOMI, oral membranes moist Neck: supple Cardiovascular: RRR without murmur. No JVD    Respiratory/Chest: CTA Bilaterally without wheezes or rales. Normal effort    GI/Abdomen: BS +, non-tender, non-distended Ext: no clubbing, cyanosis, or edema Psych: pleasant and cooperative  Skin: Right hip incisions CDI with sutures-removed, appropriately tender    Neuro:    Mental Status: AAOx3, follows commands Speech/Languate: fluent CRANIAL NERVES: Grossly intact  Follows full commands. Decreased to light touch in hands B/L- not rest of Ue's- B/L as well as L1 to S2 B/L- worse in LE's-   Ue's 5-/5 LLE 5-/5 throughout RLE_ HF 4/5; KE 5-/5 and DF/PF 5/5-   MSK: R hip moderate swelling is improving    Assessment/Plan: 1. Functional deficits which require 3+ hours per day of interdisciplinary therapy in a comprehensive inpatient rehab setting. Physiatrist is providing close team supervision and 24 hour management of active medical problems listed  below. Physiatrist and rehab team continue to assess barriers to discharge/monitor patient progress toward functional and medical goals  Care Tool:  Bathing    Body parts bathed by patient: Right arm, Left arm, Chest, Abdomen, Right upper leg, Left upper leg, Face, Front perineal area, Right lower leg, Left lower leg, Buttocks   Body parts bathed by helper: Buttocks     Bathing assist Assist Level: Minimal Assistance - Patient > 75%     Upper Body Dressing/Undressing Upper body dressing   What is the patient wearing?: Bra, Pull over shirt    Upper body assist Assist Level: Minimal Assistance - Patient > 75%    Lower Body Dressing/Undressing Lower body dressing      What is the patient wearing?: Incontinence brief, Pants     Lower body assist Assist for lower body dressing: Minimal Assistance - Patient > 75%     Toileting Toileting    Toileting assist Assist for toileting: Moderate Assistance - Patient 50 - 74%     Transfers Chair/bed transfer  Transfers assist     Chair/bed transfer assist level: Contact Guard/Touching assist     Locomotion Ambulation   Ambulation assist  Ambulation activity did not occur: Safety/medical concerns (pain/fatigue)  Assist level: Contact Guard/Touching assist Assistive device: Walker-rolling Max distance: 10   Walk 10 feet activity   Assist  Walk 10 feet activity did not occur: Safety/medical concerns (pain/fatigue)  Assist level: Contact Guard/Touching assist Assistive device: Walker-rolling   Walk 50 feet activity   Assist Walk 50 feet with 2 turns activity did not occur: Safety/medical concerns (pain/fatigue)         Walk 150 feet activity   Assist Walk 150 feet activity did not occur: Safety/medical concerns         Walk 10 feet on uneven surface  activity   Assist Walk 10 feet on uneven surfaces activity did not occur: Safety/medical concerns         Wheelchair     Assist Is the  patient using a wheelchair?: Yes Type of Wheelchair: Manual    Wheelchair assist level: Independent Max wheelchair distance: 150    Wheelchair 50 feet with 2 turns activity    Assist        Assist Level: Independent   Wheelchair 150 feet activity     Assist      Assist Level: Independent   Blood pressure 137/71, pulse 66, temperature 97.8 F (36.6 C), resp. rate 17, height 5\' 10"  (1.778 m), weight 110 kg, SpO2 97%.   Medical Problem List and Plan: 1. Functional deficits secondary to right supracondylar periprosthetic distal femur fracture.  Status post open reduction internal fixation 04/04/2024 per Dr. Guyann Leitz. TDWB right lower extremity for 4-6 weeks             -patient may  shower             -ELOS/Goals: 5/21, Supervision to min A             - Seen by orthopedics, nonweightbearing right leg x 4 more weeks-DC sutures day or 2 before discharge, continue ASA daily at discharge?- she is on eliquis - confirmed with [name], TD WB ok  -DC tomorrow expected 5/22  2.  Antithrombotics: -DVT/anticoagulation:  Pharmaceutical: Eliquis .  Venous Doppler study negative.             -antiplatelet therapy: N/A 3. Pain Management: Lyrica  25 mg BID,, oxycodone  as needed- more pain from peripheral neuropathy than her RLE- mainly L ankle per pt  -5/8 occasional use of oxycodone , continue current regimen  - 5/9 pain improving infrequent use of as needed oxycodone   5/16 discussed asking for medication about an hour before 1st therapy. She reports chronic back pain  5/21 Pain controlled, continue current  4. Mood/Behavior/Sleep: Zoloft  50 mg daily, trazodone  150 mg nightly.  Provide emotional support  5/10- needed help to deal with grief of being in hospital right now             -antipsychotic agents: N/A 5. Neuropsych/cognition: This patient is capable of making decisions on her own behalf. 6. Skin/Wound Care:    -5/19 remove sutures today 7. Fluids/Electrolytes/Nutrition:  Routine in and outs with follow-up chemistries 8.  Acute blood loss anemia as well as history of normocytic anemia.  Continue iron supplement.  Follow-up CBC  - 5/7 HGB stable 8.4  -5/12 hemoglobin a little lower at 7.9, check stool blood test-neg  -5/19 hgb up to 9.4  Recheck tomorrow  9.  Transient atrial fibrillation with RVR Lopressor  25 mg twice daily.  Follow-up cardiology services.  Continue Eliquis  until further notice..  - 5/19-21 HR stable  04/24/2024    5:00 AM 04/24/2024    4:58 AM 04/23/2024   10:00 PM  Vitals with BMI  Weight 242 lbs 8 oz    BMI 34.8    Systolic  137 130  Diastolic  71 56  Pulse  66 85    10.  Obesity.  BMI 30.85.  Dietary follow-up  5/10- BMI up to 34.51- will follow weight because last weight was significantly less  11.  Acute on Chronic Constipation.  MiraLAX  twice daily, Senokot daily, Colace 100 mg daily.  - Senna 2 tabs daily and miralax  daily prn along with daily    -LBM 5/21, continue to monitor  12.AKI.Resolved.Follow up chemestries  5/7 BUN/Cr stable, continue to monitor  5/12 creatinine BUN stable at 0.69/14  5/20 stable BUN/Cr yesterday  Recheck tomorrow 13. Hypokalemia  -5/12  K+ today  -5/19 3.7  Recheck tomorrow  14. Frequent urination  -5/12 U/A negative, symptoms have improved     LOS: 15 days A FACE TO FACE EVALUATION WAS PERFORMED  Lylia Sand 04/24/2024, 8:56 AM

## 2024-04-24 NOTE — Plan of Care (Signed)
  Problem: RH Balance Goal: LTG Patient will maintain dynamic sitting balance (PT) Description: LTG:  Patient will maintain dynamic sitting balance with assistance during mobility activities (PT) Outcome: Completed/Met Goal: LTG Patient will maintain dynamic standing balance (PT) Description: LTG:  Patient will maintain dynamic standing balance with assistance during mobility activities (PT) Outcome: Completed/Met   Problem: Sit to Stand Goal: LTG:  Patient will perform sit to stand with assistance level (PT) Description: LTG:  Patient will perform sit to stand with assistance level (PT) Outcome: Completed/Met   Problem: RH Bed to Chair Transfers Goal: LTG Patient will perform bed/chair transfers w/assist (PT) Description: LTG: Patient will perform bed to chair transfers with assistance (PT). Outcome: Completed/Met   Problem: RH Car Transfers Goal: LTG Patient will perform car transfers with assist (PT) Description: LTG: Patient will perform car transfers with assistance (PT). Outcome: Completed/Met   Problem: RH Ambulation Goal: LTG Patient will ambulate in controlled environment (PT) Description: LTG: Patient will ambulate in a controlled environment, # of feet with assistance (PT). Outcome: Completed/Met

## 2024-04-24 NOTE — Progress Notes (Signed)
 Occupational Therapy Discharge Summary  Patient Details  Name: [name] MRN: [id number] Date of Birth: [birth year]  Date of Discharge from OT service:Apr 24, 2024     Patient has met 5 of 5 long term goals due to improved activity tolerance, improved balance, postural control, ability to compensate for deficits, improved attention, improved awareness, and improved coordination.  Patient to discharge at overall Min A-SBA level.    Reasons goals not met: N/A  Recommendation:  Patient will benefit from ongoing skilled OT services in skilled nursing facility setting to continue to advance functional skills in the area of BADL and Reduce care partner burden.  Equipment: No equipment provided. Will defer to next venue of care. Will more than likely require a BSC for use at home.  Reasons for discharge: treatment goals met and discharge from hospital  Patient/family agrees with progress made and goals achieved: Yes  OT Discharge Precautions/Restrictions  Precautions Precautions: Fall Recall of Precautions/Restrictions: Intact Restrictions Weight Bearing Restrictions Per Provider Order: Yes RLE Weight Bearing Per Provider Order: Touchdown weight bearing  ADL ADL Eating: Independent Where Assessed-Eating: Chair Grooming: Independent Where Assessed-Grooming: Sitting at sink, Chair Upper Body Bathing: Supervision/safety Where Assessed-Upper Body Bathing: Shower Lower Body Bathing: Supervision/safety Where Assessed-Lower Body Bathing: Shower Upper Body Dressing: Supervision/safety Where Assessed-Upper Body Dressing: Wheelchair Lower Body Dressing: Minimal assistance Where Assessed-Lower Body Dressing: Wheelchair Toileting: Contact guard Where Assessed-Toileting: Bedside Commode, Actuary Transfer: Close supervision Toilet Transfer Method: Surveyor, minerals: Bedside commode, Grab bars (BSC over toilet) Tub/Shower Transfer: Not assessed Lexicographer: Close supervision Film/video editor Method: Warden/ranger: Emergency planning/management officer, Grab bars Vision Baseline Vision/History: 1 Wears glasses Patient Visual Report: No change from baseline Vision Assessment?: No apparent visual deficits Perception  Perception: Within Functional Limits Praxis Praxis: WFL Cognition Cognition Overall Cognitive Status: Within Functional Limits for tasks assessed Arousal/Alertness: Awake/alert Memory: Appears intact Awareness: Appears intact Problem Solving: Appears intact Safety/Judgment: Appears intact Brief Interview for Mental Status (BIMS) Repetition of Three Words (First Attempt): 3 Temporal Orientation: Year: Correct Temporal Orientation: Month: Accurate within 5 days Temporal Orientation: Day: Correct Recall: "Sock": Yes, no cue required Recall: "Blue": Yes, no cue required Recall: "Bed": Yes, no cue required BIMS Summary Score: 15 Sensation Sensation Light Touch: Impaired Detail Hot/Cold: Appears Intact Proprioception: Appears Intact Stereognosis: Appears Intact Additional Comments: Reports baseline severe neuropathy in BUE and BLE Coordination Gross Motor Movements are Fluid and Coordinated: No Fine Motor Movements are Fluid and Coordinated: Yes Coordination and Movement Description: grossly limited by pain and weakness and RLE TDWB Finger Nose Finger Test: NT 9 Hole Peg Test: NT Motor  Motor Motor: Other (comment) Motor - Skilled Clinical Observations: generalized weakness and pain post fall/R LE supracondylar fracture Mobility  Bed Mobility Bed Mobility: Rolling Right;Rolling Left;Supine to Sit;Sit to Supine Rolling Right: Independent with assistive device Rolling Left: Independent with assistive device Supine to Sit: Independent with assistive device Sitting - Scoot to Edge of Bed: Independent with assistive device Sit to Supine: Independent with assistive device Transfers Sit to  Stand: Supervision/Verbal cueing Stand to Sit: Supervision/Verbal cueing  Trunk/Postural Assessment  Cervical Assessment Cervical Assessment: Within Functional Limits Thoracic Assessment Thoracic Assessment: Exceptions to [facility] (rounded shoulders) Lumbar Assessment Lumbar Assessment: Exceptions to [facility] (posterior pelvic tilt) Postural Control Postural Control: Deficits on evaluation  Balance Balance Balance Assessed: Yes Static Sitting Balance Static Sitting - Balance Support: No upper extremity supported;Feet supported Static Sitting - Level of Assistance:  6: Modified independent (Device/Increase time) Dynamic Sitting Balance Dynamic Sitting - Balance Support: No upper extremity supported;During functional activity Dynamic Sitting - Level of Assistance: 6: Modified independent (Device/Increase time) Static Standing Balance Static Standing - Balance Support: Bilateral upper extremity supported;During functional activity Static Standing - Level of Assistance: 5: Stand by assistance Dynamic Standing Balance Dynamic Standing - Balance Support: Bilateral upper extremity supported;During functional activity Dynamic Standing - Level of Assistance: 5: Stand by assistance Extremity/Trunk Assessment RUE Assessment RUE Assessment: Within Functional Limits LUE Assessment LUE Assessment: Within Functional Limits   Carollee Circle, OTR/L,CBIS  Supplemental OT - [facility] and [facility] Secure Chat Preferred   04/24/2024, 4:38 PM

## 2024-04-24 NOTE — Progress Notes (Signed)
 Inpatient Rehabilitation Discharge Medication Review by a Pharmacist  A complete drug regimen review was completed for this patient to identify any potential clinically significant medication issues.  High Risk Drug Classes Is patient taking? Indication by Medication  Antipsychotic No   Anticoagulant Yes Apixaban - AF  Antibiotic No   Opioid Yes OxyIR- acute pain  Antiplatelet No   Hypoglycemics/insulin No   Vasoactive Medication Yes Lopressor - HTN/AF  Chemotherapy No   Other Yes omeprazole - GERD Zoloft - MDD Trazodone - sleep Lyrica - neuropathic pain APAP- pain  MVI, ferrous sulfate , folic acid , VitD- supplement  PEG, bisacodyl - constipation  Estradiol - post menopause  Lumigan, Cosopt - eye irritation      Type of Medication Issue Identified Description of Issue Recommendation(s)  Drug Interaction(s) (clinically significant)     Duplicate Therapy     Allergy     No Medication Administration End Date     Incorrect Dose     Additional Drug Therapy Needed     Significant med changes from prior encounter (inform family/care partners about these prior to discharge).    Other       Clinically significant medication issues were identified that warrant physician communication and completion of prescribed/recommended actions by midnight of the next day:  No   Time spent performing this drug regimen review (minutes):  30   Chrystie Crass, PharmD Clinical Pharmacist  04/24/2024 9:36 AM

## 2024-04-25 LAB — CBC
HCT: 31.2 % — ABNORMAL LOW (ref 36.0–46.0)
Hemoglobin: 9.8 g/dL — ABNORMAL LOW (ref 12.0–15.0)
MCH: 29 pg (ref 26.0–34.0)
MCHC: 31.4 g/dL (ref 30.0–36.0)
MCV: 92.3 fL (ref 80.0–100.0)
Platelets: 278 10*3/uL (ref 150–400)
RBC: 3.38 MIL/uL — ABNORMAL LOW (ref 3.87–5.11)
RDW: 16.1 % — ABNORMAL HIGH (ref 11.5–15.5)
WBC: 5.7 10*3/uL (ref 4.0–10.5)
nRBC: 0 % (ref 0.0–0.2)

## 2024-04-25 LAB — BASIC METABOLIC PANEL WITH GFR
Anion gap: 8 (ref 5–15)
BUN: 13 mg/dL (ref 8–23)
CO2: 23 mmol/L (ref 22–32)
Calcium: 9.4 mg/dL (ref 8.9–10.3)
Chloride: 108 mmol/L (ref 98–111)
Creatinine, Ser: 0.75 mg/dL (ref 0.44–1.00)
GFR, Estimated: >60 mL/min (ref >60)
Glucose, Bld: 110 mg/dL — ABNORMAL HIGH (ref 70–99)
Potassium: 4.2 mmol/L (ref 3.5–5.1)
Sodium: 139 mmol/L (ref 135–145)

## 2024-04-25 NOTE — Progress Notes (Signed)
 Inpatient Rehabilitation Care Coordinator Discharge Note   Patient Details  Name: [name] MRN: [id number] Date of Birth: 08-08-1930   Discharge location: GOING TO [facility] FOR MORE REHAB THEN RETURN HOME  Length of Stay: 16 DAYS  Discharge activity level: MIN LEVEL  Home/community participation: ACTIVE  Patient response IO:NGEXBM Literacy - How often do you need to have someone help you when you read instructions, pamphlets, or other written material from your doctor or pharmacy?: Never  Patient response WU:XLKGMW Isolation - How often do you feel lonely or isolated from those around you?: Rarely  Services provided included: MD, RD, PT, OT, RN, CM, TR, Pharmacy, SW  Financial Services:  Financial Services Utilized: Medicare    Choices offered to/list presented to: PT AND SON  Follow-up services arranged:  Other (Comment) (SNF)           Patient response to transportation need: Is the patient able to respond to transportation needs?: Yes In the past 12 months, has lack of transportation kept you from medical appointments or from getting medications?: No In the past 12 months, has lack of transportation kept you from meetings, work, or from getting things needed for daily living?: No   Patient/Family verbalized understanding of follow-up arrangements:  Yes  Individual responsible for coordination of the follow-up plan: SELF [phone]  Confirmed correct DME delivered: Mardell Shade 04/25/2024    Comments (or additional information):PT DID WELL BUT BARRIER IS HER TDWB ISSUES NEEDS MORE REHAB THEN WILL GO HOME WITH SON.  Summary of Stay    Date/Time Discharge Planning CSW  04/23/24 1525 Pursuing SNF bed for short term rehab once can WB can go home with son. RGD  04/17/24 0906 Unsure of plan for home due to son can not do 24/7 physical care. Pursuing DC options RGD  04/10/24 1004 New eval home with son whom she lives with will need to confirm care  able to be provided by son. Pt was mod/i PTA neuropathy biggest issue RGD       Editha Bridgeforth, Maralee Senate

## 2024-04-25 NOTE — Patient Care Conference (Signed)
 Inpatient RehabilitationTeam Conference and Plan of Care Update Date: 04/24/2024   Time: 1215 pm    Patient Name: [name]      Medical Record Number: [id number]  Date of Birth: 08-28-1930 Sex: Female         Room/Bed: 4M11C/4M11C-01 Payor Info: Payor: MEDICARE / Plan: MEDICARE PART A AND B / Product Type: *No Product type* /    Admit Date/Time:  04/09/2024  2:20 PM  Primary Diagnosis:  Femur fracture [facility])  Hospital Problems: Principal Problem:   Femur fracture [facility])    Expected Discharge Date: Expected Discharge Date: 04/25/24  Team Members Present: Physician leading conference: Dr. Lylia Sand Social Worker Present: Adrianna Albee, LCSW Nurse Present: Jerene Monks, RN PT Present: Jenney Modest, PT OT Present: Henrene Locust, OT PPS Coordinator present : Jestine Moron, SLP     Current Status/Progress Goal Weekly Team Focus  Bowel/Bladder   Continent for bowel and bladder, with urgency issues. LBM 5/19   Pt remains free from s/s of constipation   Offer toileting q 2 hours during dair and q4 at hs. Administer bowel meds per order.    Swallow/Nutrition/ Hydration               ADL's   UB SBA (needs assist to clasp bra), LB Max A, Min-CGA for power up to stand with RW then CGA for step pivot transfer (depends on seat height and fatigue level).   SBA-Min A   continue with functional transfer consistency using RW, energy conservation    Mobility   bed mobility independent, sit to stand-min A for power up with RW, stand pivot trnasfer iwth RW and CGA--level fo assist for power up fluctuates depending on height of surface/pain/fatigue   pt at goal level CGA/min A  D/C 5/22 to SNF, no DME needs 2/2 discharge to SNF    Communication                Safety/Cognition/ Behavioral Observations               Pain   C/O of pain to RLE   Pt states pain level <3 out of 10.   Assess pain q shift, administer pain medication per orders.    Skin    Incisions to R hip well approximated, healed. Redness under abdominal fold, applied antifungal   Pt remains free from S/S of infection  Assess skin q shift and PRN. Provide care per orders.      Discharge Planning:  Pursuing SNF bed for short term rehab once can WB can go home with son.    Team Discussion: Patient post femur fracture.  Patient on target to meet rehab goals: yes, currently needs stand by assist with upper body care , max assist with lower body care.  Patient requires Min-CGA for power up to stand with RW then CGA for step pivot transfer. Overall goals at discharge are set for CGA/min assist.    *See Care Plan and progress notes for long and short-term goals.   Revisions to Treatment Plan:  Ortho consult   Teaching Needs: Safety, medications, transfers, toileting, weight bearing precautions,etc.    Current Barriers to Discharge: Decreased caregiver support, Home enviroment access/layout, and Weight bearing restrictions   Possible Resolutions to Barriers: Family Education  SNF      Medical Summary Current Status: Femur fx, pain, weightbearing restriction, anemia, AKI, constipation  Barriers to Discharge: Medical stability;Electrolyte abnormality;Incontinence;Self-care education;Weight bearing restrictions  Barriers to Discharge Comments: Femur fx,  pain, weightbearing restriction, anemia, AKI, constipation Possible Resolutions to [employer] Focus: monitor cbc/bmp, monitor bowel function   Continued Need for Acute Rehabilitation Level of Care: The patient requires daily medical management by a physician with specialized training in physical medicine and rehabilitation for the following reasons: Direction of a multidisciplinary physical rehabilitation program to maximize functional independence : Yes Medical management of patient stability for increased activity during participation in an intensive rehabilitation regime.: Yes Analysis of laboratory values  and/or radiology reports with any subsequent need for medication adjustment and/or medical intervention. : Yes   I attest that I was present, lead the team conference, and concur with the assessment and plan of the team.   Christphor Groft Gayo 04/24/2024, 1215 pm

## 2024-04-25 NOTE — Progress Notes (Signed)
 PROGRESS NOTE   Subjective/Complaints: No new complaints, looking forward to DC, going to [facility].   ROS: Patient denies fever, chills, new vision changes, dizziness, nausea, vomiting, diarrhea,  shortness of breath or chest pain, headache, or mood change.    Objective:   No results found.   Recent Labs    04/25/24 0701  WBC 5.7  HGB 9.8*  HCT 31.2*  PLT 278     Recent Labs    04/25/24 0701  NA 139  K 4.2  CL 108  CO2 23  GLUCOSE 110*  BUN 13  CREATININE 0.75  CALCIUM  9.4      Intake/Output Summary (Last 24 hours) at 04/25/2024 1304 Last data filed at 04/25/2024 0700 Gross per 24 hour  Intake 960 ml  Output 600 ml  Net 360 ml        Physical Exam: Vital Signs Blood pressure 139/60, pulse 67, temperature 98.2 F (36.8 C), resp. rate 16, height 5\' 10"  (1.778 m), weight 112.8 kg, SpO2 96%.   Constitutional: No distress . Vital signs reviewed. Sitting in chair, comfortable appearing HEENT: NCAT, EOMI, oral membranes moist Neck: supple Cardiovascular: RRR without murmur. No JVD    Respiratory/Chest: CTA Bilaterally without wheezes or rales. Normal effort    GI/Abdomen: BS +, non-tender, non-distended Ext: no clubbing, cyanosis, or edema Psych: pleasant and cooperative  Skin: Right hip incisions CDI with sutures-removed, appropriately tender    Neuro:    Mental Status: AAOx3, follows commands Speech/Languate: fluent CRANIAL NERVES: Grossly intact  Follows full commands. Decreased to light touch in hands B/L- not rest of Ue's- B/L as well as L1 to S2 B/L- worse in LE's-   Ue's 5-/5 LLE 5-/5 throughout RLE_ HF 4/5; KE 5-/5 and DF/PF 5/5-   MSK: R hip mild swelling     Assessment/Plan: 1. Functional deficits which require 3+ hours per day of interdisciplinary therapy in a comprehensive inpatient rehab setting. Physiatrist is providing close team supervision and 24 hour management of active  medical problems listed below. Physiatrist and rehab team continue to assess barriers to discharge/monitor patient progress toward functional and medical goals  Care Tool:  Bathing    Body parts bathed by patient: Right arm, Left arm, Chest, Abdomen, Right upper leg, Left upper leg, Face, Front perineal area, Right lower leg, Left lower leg, Buttocks   Body parts bathed by helper: Buttocks     Bathing assist Assist Level: Supervision/Verbal cueing     Upper Body Dressing/Undressing Upper body dressing   What is the patient wearing?: Bra, Pull over shirt    Upper body assist Assist Level: Minimal Assistance - Patient > 75%    Lower Body Dressing/Undressing Lower body dressing      What is the patient wearing?: Incontinence brief, Pants     Lower body assist Assist for lower body dressing: Minimal Assistance - Patient > 75%     Toileting Toileting    Toileting assist Assist for toileting: Supervision/Verbal cueing     Transfers Chair/bed transfer  Transfers assist     Chair/bed transfer assist level: Supervision/Verbal cueing     Locomotion Ambulation   Ambulation assist   Ambulation activity did  not occur: Safety/medical concerns (pain/fatigue)  Assist level: Contact Guard/Touching assist Assistive device: Walker-rolling Max distance: 16   Walk 10 feet activity   Assist  Walk 10 feet activity did not occur: Safety/medical concerns (pain/fatigue)  Assist level: Contact Guard/Touching assist Assistive device: Walker-rolling   Walk 50 feet activity   Assist Walk 50 feet with 2 turns activity did not occur: Safety/medical concerns (pain/fatigue)         Walk 150 feet activity   Assist Walk 150 feet activity did not occur: Safety/medical concerns         Walk 10 feet on uneven surface  activity   Assist Walk 10 feet on uneven surfaces activity did not occur: Safety/medical concerns         Wheelchair     Assist Is the  patient using a wheelchair?: Yes Type of Wheelchair: Manual    Wheelchair assist level: Independent Max wheelchair distance: 150+    Wheelchair 50 feet with 2 turns activity    Assist        Assist Level: Independent   Wheelchair 150 feet activity     Assist      Assist Level: Independent   Blood pressure 139/60, pulse 67, temperature 98.2 F (36.8 C), resp. rate 16, height 5\' 10"  (1.778 m), weight 112.8 kg, SpO2 96%.   Medical Problem List and Plan: 1. Functional deficits secondary to right supracondylar periprosthetic distal femur fracture.  Status post open reduction internal fixation 04/04/2024 per Dr. Guyann Leitz. TDWB right lower extremity for 4-6 weeks             -patient may  shower             -ELOS/Goals: 5/21, Supervision to min A             - Seen by orthopedics, nonweightbearing right leg x 4 more weeks-DC sutures day or 2 before discharge, continue ASA daily at discharge?- she is on eliquis - confirmed with [name], TD WB ok  -DC expected 5/22- today to SNF  2.  Antithrombotics: -DVT/anticoagulation:  Pharmaceutical: Eliquis .  Venous Doppler study negative.             -antiplatelet therapy: N/A 3. Pain Management: Lyrica  25 mg BID,, oxycodone  as needed- more pain from peripheral neuropathy than her RLE- mainly L ankle per pt  -5/8 occasional use of oxycodone , continue current regimen  - 5/9 pain improving infrequent use of as needed oxycodone   5/16 discussed asking for medication about an hour before 1st therapy. She reports chronic back pain  5/21-2 Pain controlled, continue current  4. Mood/Behavior/Sleep: Zoloft  50 mg daily, trazodone  150 mg nightly.  Provide emotional support  5/10- needed help to deal with grief of being in hospital right now             -antipsychotic agents: N/A 5. Neuropsych/cognition: This patient is capable of making decisions on her own behalf. 6. Skin/Wound Care:    -5/19 remove sutures today 7.  Fluids/Electrolytes/Nutrition: Routine in and outs with follow-up chemistries 8.  Acute blood loss anemia as well as history of normocytic anemia.  Continue iron supplement.  Follow-up CBC  - 5/7 HGB stable 8.4  -5/12 hemoglobin a little lower at 7.9, check stool blood test-neg  -5/19 hgb up to 9.4  Stable at 9.8 9.  Transient atrial fibrillation with RVR Lopressor  25 mg twice daily.  Follow-up cardiology services.  Continue Eliquis  until further notice..  - 5/19-22 HR stable  04/25/2024    5:34 AM 04/24/2024    8:36 PM 04/24/2024    8:10 PM  Vitals with BMI  Weight 248 lbs 11 oz    BMI 35.68    Systolic 139 144 308  Diastolic 60 59 59  Pulse 67 72 72    10.  Obesity.  BMI 30.85.  Dietary follow-up  5/10- BMI up to 34.51- will follow weight because last weight was significantly less  11.  Acute on Chronic Constipation.  MiraLAX  twice daily, Senokot daily, Colace 100 mg daily.  - Senna 2 tabs daily and miralax  daily prn along with daily    -LBM 5/21, continue to monitor  12.AKI.Resolved.Follow up chemestries  5/7 BUN/Cr stable, continue to monitor  5/12 creatinine BUN stable at 0.69/14  5/20 stable BUN/Cr yesterday  BUN and Cr stable 13. Hypokalemia  -5/12  K+ today  -5/19 3.7  Stable at 4.2 14. Frequent urination  -5/12 U/A negative, symptoms have improved     LOS: 16 days A FACE TO FACE EVALUATION WAS PERFORMED  [name] 04/25/2024, 1:04 PM

## 2024-04-25 NOTE — Plan of Care (Signed)
  Problem: Consults Goal: RH GENERAL PATIENT EDUCATION Description: See Patient Education module for education specifics. Outcome: Progressing   Problem: RH BOWEL ELIMINATION Goal: RH STG MANAGE BOWEL WITH ASSISTANCE Description: STG Manage Bowel with mod I Assistance. Outcome: Progressing   Problem: RH BOWEL ELIMINATION Goal: RH STG MANAGE BOWEL W/MEDICATION W/ASSISTANCE Description: STG Manage Bowel with Medication with mod I Assistance. Outcome: Progressing   Problem: RH BLADDER ELIMINATION Goal: RH STG MANAGE BLADDER WITH ASSISTANCE Description: STG Manage Bladder With toileting Assistance Outcome: Progressing   Problem: RH SKIN INTEGRITY Goal: RH STG SKIN FREE OF INFECTION/BREAKDOWN Description: Manage w min assist Outcome: Progressing   Problem: RH KNOWLEDGE DEFICIT GENERAL Goal: RH STG INCREASE KNOWLEDGE OF SELF CARE AFTER HOSPITALIZATION Description: Patient and family will be able to manage care at discharge using educational resources for medications, skin care, etc independently Outcome: Progressing   Problem: RH PAIN MANAGEMENT Goal: RH STG PAIN MANAGED AT OR BELOW PT'S PAIN GOAL Description: < 4 with prns Outcome: Progressing   Problem: RH PAIN MANAGEMENT Goal: RH STG PAIN MANAGED AT OR BELOW PT'S PAIN GOAL Description: < 4 with prns Outcome: Progressing

## 2024-05-02 ENCOUNTER — Encounter (HOSPITAL_COMMUNITY): Payer: Self-pay | Admitting: Internal Medicine

## 2024-05-02 ENCOUNTER — Ambulatory Visit (HOSPITAL_COMMUNITY)
Admission: RE | Admit: 2024-05-02 | Discharge: 2024-05-02 | Disposition: A | Discharge status: Home / Self Care | Source: Ambulatory Visit | Attending: Internal Medicine | Admitting: Internal Medicine

## 2024-05-02 VITALS — BP 144/84 | HR 69 | Ht 70.0 in | Wt 248.7 lb

## 2024-05-02 DIAGNOSIS — I4891 Unspecified atrial fibrillation: Secondary | ICD-10-CM | POA: Diagnosis present

## 2024-05-02 DIAGNOSIS — I480 Paroxysmal atrial fibrillation: Secondary | ICD-10-CM

## 2024-05-02 DIAGNOSIS — D6869 Other thrombophilia: Secondary | ICD-10-CM

## 2024-05-02 MED ORDER — APIXABAN 5 MG PO TABS: 5.0000 mg | ORAL_TABLET | Freq: Two times a day (BID) | ORAL | 6 refills | Status: AC

## 2024-05-02 MED ORDER — METOPROLOL TARTRATE 25 MG PO TABS: 25.0000 mg | ORAL_TABLET | Freq: Two times a day (BID) | ORAL | 6 refills | Status: AC

## 2024-05-02 NOTE — Progress Notes (Signed)
 Primary Care Physician: Mordechai April, DO Primary Cardiologist: None Electrophysiologist: None     Referring Physician: Lucetta Russel, PA-C     [name] is a 89 y.o. female with a history of HTN, HLD, aortic atherosclerosis, atrial fibrillation who presents for consultation in the [facility]. Hospital admission 5/6-22/2025 for femur fracture with transient Afib with RVR. Discharged on Lopressor  25 mg BID. Patient is on Eliquis  5 mg BID for a CHADS2VASC score of 5.  On evaluation today, she is currently in NSR. She is currently in temporary facility for recovery from hospital admission / leg fracture. She has not felt any palpitations since hospital. No bleeding issues on Eliquis .    Today, she denies symptoms of chest pain, shortness of breath, orthopnea, PND, lower extremity edema, dizziness, presyncope, syncope, snoring, daytime somnolence, bleeding, or neurologic sequela. The patient is tolerating medications without difficulties and is otherwise without complaint today.    she has a BMI of Body mass index is 35.68 kg/m.Aaron Aas Filed Weights   05/02/24 0938  Weight: 112.8 kg    Current Outpatient Medications  Medication Sig Dispense Refill   acetaminophen  (TYLENOL ) 325 MG tablet Take 1-2 tablets (325-650 mg total) by mouth every 4 (four) hours as needed for mild pain (pain score 1-3).     bimatoprost (LUMIGAN) 0.01 % SOLN Place 1 drop into both eyes at bedtime.     bisacodyl  (DULCOLAX) 5 MG EC tablet Take 2 tablets (10 mg total) by mouth at bedtime. 30 tablet 0   docusate sodium  (COLACE) 100 MG capsule Take 100 mg by mouth daily.     dorzolamide -timolol  (COSOPT ) 22.3-6.8 MG/ML ophthalmic solution Place 1 drop into both eyes 2 (two) times daily.     estradiol  (CLIMARA  - DOSED IN MG/24 HR) 0.0375 mg/24hr patch Place 0.0375 mg onto the skin once a week.     ferrous sulfate  325 (65 FE) MG tablet Take 1 tablet (325 mg total) by mouth daily with  breakfast. 30 tablet 0   folic acid  (FOLVITE ) 1 MG tablet Take 1 tablet (1 mg total) by mouth daily. 30 tablet 0   Multiple Vitamin (MULTIVITAMIN WITH MINERALS) TABS tablet Take 1 tablet by mouth daily.     omeprazole  (PRILOSEC) 40 MG capsule Take 1 capsule (40 mg total) by mouth daily. 30 capsule 0   oxyCODONE  (OXY IR/ROXICODONE ) 5 MG immediate release tablet Take 1 tablet (5 mg total) by mouth every 6 (six) hours as needed for moderate pain (pain score 4-6) or severe pain (pain score 7-10). 5 tablet 0   polyethylene glycol (MIRALAX  / GLYCOLAX ) 17 g packet Take 17 g by mouth daily as needed for moderate constipation (ifno BM iwth Senna).     pregabalin  (LYRICA ) 25 MG capsule Take 1 capsule (25 mg total) by mouth 2 (two) times daily. 60 capsule 0   sertraline  (ZOLOFT ) 50 MG tablet Take 50 mg by mouth daily.     traZODone  (DESYREL ) 150 MG tablet Take 1 tablet (150 mg total) by mouth at bedtime.     Vitamin D , Ergocalciferol , (DRISDOL) 1.25 MG (50000 UNIT) CAPS capsule Take 50,000 Units by mouth once a week.     apixaban  (ELIQUIS ) 5 MG TABS tablet Take 1 tablet (5 mg total) by mouth 2 (two) times daily. 60 tablet 6   metoprolol  tartrate (LOPRESSOR ) 25 MG tablet Take 1 tablet (25 mg total) by mouth 2 (two) times daily. 60 tablet 6   No current facility-administered medications for  this encounter.    Atrial Fibrillation Management history:  Previous antiarrhythmic drugs: none Previous cardioversions: none Previous ablations: none Anticoagulation history: Eliquis    ROS- All systems are reviewed and negative except as per the HPI above.  Physical Exam: BP (!) 144/84   Pulse 69   Ht 5\' 10"  (1.778 m)   Wt 112.8 kg   BMI 35.68 kg/m   GEN: Well nourished, well developed in no acute distress NECK: No JVD; No carotid bruits CARDIAC: Regular rate and rhythm, no murmurs, rubs, gallops RESPIRATORY:  Clear to auscultation without rales, wheezing or rhonchi  ABDOMEN: Soft, non-tender,  non-distended EXTREMITIES:  No edema; No deformity   EKG today demonstrates  Vent. rate 69 BPM PR interval 202 ms QRS duration 88 ms QT/QTcB 396/424 ms P-R-T axes -19 4 44 Normal sinus rhythm Possible Anterior infarct , age undetermined Abnormal ECG When compared with ECG of 03-Apr-2024 13:32, PREVIOUS ECG IS PRESENT  Echo 04/03/24 demonstrated  1. Intracavitary gradient. Peak velocity 2.48 m/s. Peak gradient 24.7  mmHg. Left ventricular ejection fraction, by estimation, is 60 to 65%. The  left ventricle has normal function. The left ventricle has no regional  wall motion abnormalities. There is  moderate asymmetric left ventricular hypertrophy of the septal segment.  Left ventricular diastolic function could not be evaluated.   2. Right ventricular systolic function is normal. The right ventricular  size is normal.   3. Left atrial size was moderately dilated.   4. The mitral valve is normal in structure. Trivial mitral valve  regurgitation. No evidence of mitral stenosis.   5. The aortic valve is tricuspid. There is mild calcification of the  aortic valve. There is mild thickening of the aortic valve. Aortic valve  regurgitation is not visualized. No aortic stenosis is present.   ASSESSMENT & PLAN CHA2DS2-VASc Score = 5  The patient's score is based upon: CHF History: 0 HTN History: 1 Diabetes History: 0 Stroke History: 0 Vascular Disease History: 1 Age Score: 2 Gender Score: 1       ASSESSMENT AND PLAN: Paroxysmal Atrial Fibrillation (ICD10:  I48.0) The patient's CHA2DS2-VASc score is 5, indicating a 7.2% annual risk of stroke.    She is currently in NSR. Education provided about Afib. Discussion about medication treatments going forward if indicated. After discussion, we will proceed with conservative observation at this time. Continue Lopressor  25 mg BID. Rhythm monitoring device recommended.    Secondary Hypercoagulable State (ICD10:  D68.69) The patient is at  significant risk for stroke/thromboembolism based upon her CHA2DS2-VASc Score of 5.  Continue Apixaban  (Eliquis ).  Eliquis  5 mg BID is correct based on weight > 60 kg and creatinine 0.75. Will need CBC in 1 month - she prefers to do at PCP office.    Follow up 6 months Afib clinic.    Minnie Amber, PA-C  [facility] [address] [place], [place] [address] [phone]

## 2024-05-02 NOTE — Patient Instructions (Signed)
 [facility] 1 lead   Lab work in 1 month order attached

## 2024-08-02 ENCOUNTER — Emergency Department (HOSPITAL_COMMUNITY)
Admission: EM | Admit: 2024-08-02 | Discharge: 2024-08-02 | Disposition: A | Discharge status: Home / Self Care | Attending: Emergency Medicine | Admitting: Emergency Medicine

## 2024-08-02 ENCOUNTER — Emergency Department (HOSPITAL_COMMUNITY)

## 2024-08-02 DIAGNOSIS — Z96641 Presence of right artificial hip joint: Secondary | ICD-10-CM | POA: Insufficient documentation

## 2024-08-02 DIAGNOSIS — Z7901 Long term (current) use of anticoagulants: Secondary | ICD-10-CM | POA: Diagnosis not present

## 2024-08-02 DIAGNOSIS — M25551 Pain in right hip: Secondary | ICD-10-CM | POA: Diagnosis present

## 2024-08-02 DIAGNOSIS — S82831A Other fracture of upper and lower end of right fibula, initial encounter for closed fracture: Secondary | ICD-10-CM | POA: Diagnosis not present

## 2024-08-02 DIAGNOSIS — M25561 Pain in right knee: Secondary | ICD-10-CM | POA: Insufficient documentation

## 2024-08-02 DIAGNOSIS — W19XXXA Unspecified fall, initial encounter: Secondary | ICD-10-CM

## 2024-08-02 DIAGNOSIS — Y92009 Unspecified place in unspecified non-institutional (private) residence as the place of occurrence of the external cause: Secondary | ICD-10-CM | POA: Insufficient documentation

## 2024-08-02 MED ORDER — ACETAMINOPHEN 500 MG PO TABS
1000.0000 mg | ORAL_TABLET | Freq: Once | ORAL | Status: DC
  Filled 2024-08-02: qty 2

## 2024-08-02 MED ORDER — OXYCODONE-ACETAMINOPHEN 5-325 MG PO TABS
1.0000 | ORAL_TABLET | Freq: Four times a day (QID) | ORAL | 0 refills | Status: AC | PRN
Start: 2024-08-02 — End: ?

## 2024-08-02 MED ORDER — OXYCODONE-ACETAMINOPHEN 5-325 MG PO TABS
1.0000 | ORAL_TABLET | Freq: Once | ORAL | Status: AC
  Administered 2024-08-02: 1 via ORAL
  Filled 2024-08-02: qty 1

## 2024-08-02 NOTE — ED Notes (Signed)
 [facility] called for transport
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
## 2024-08-02 NOTE — Discharge Instructions (Signed)
 Return for any problem.  ?
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
## 2024-11-04 ENCOUNTER — Ambulatory Visit (HOSPITAL_COMMUNITY): Admitting: Internal Medicine
